# Patient Record
Sex: Female | Born: 1955 | Race: White | Hispanic: No | Smoking: Former smoker
Health system: Southern US, Community
[De-identification: ages and names within clinical notes are randomized; demographics above are authoritative.]

## PROBLEM LIST (undated history)

## (undated) DIAGNOSIS — I480 Paroxysmal atrial fibrillation: Secondary | ICD-10-CM

## (undated) DIAGNOSIS — K219 Gastro-esophageal reflux disease without esophagitis: Secondary | ICD-10-CM

## (undated) DIAGNOSIS — R079 Chest pain, unspecified: Secondary | ICD-10-CM

## (undated) DIAGNOSIS — F4310 Post-traumatic stress disorder, unspecified: Secondary | ICD-10-CM

## (undated) DIAGNOSIS — G4730 Sleep apnea, unspecified: Secondary | ICD-10-CM

## (undated) DIAGNOSIS — E042 Nontoxic multinodular goiter: Secondary | ICD-10-CM

## (undated) DIAGNOSIS — R0789 Other chest pain: Secondary | ICD-10-CM

## (undated) DIAGNOSIS — E785 Hyperlipidemia, unspecified: Secondary | ICD-10-CM

## (undated) DIAGNOSIS — M549 Dorsalgia, unspecified: Secondary | ICD-10-CM

## (undated) DIAGNOSIS — M542 Cervicalgia: Secondary | ICD-10-CM

## (undated) DIAGNOSIS — G43909 Migraine, unspecified, not intractable, without status migrainosus: Secondary | ICD-10-CM

## (undated) DIAGNOSIS — E039 Hypothyroidism, unspecified: Secondary | ICD-10-CM

## (undated) DIAGNOSIS — F411 Generalized anxiety disorder: Secondary | ICD-10-CM

## (undated) DIAGNOSIS — F17200 Nicotine dependence, unspecified, uncomplicated: Secondary | ICD-10-CM

## (undated) DIAGNOSIS — E538 Deficiency of other specified B group vitamins: Secondary | ICD-10-CM

## (undated) DIAGNOSIS — Z720 Tobacco use: Secondary | ICD-10-CM

## (undated) DIAGNOSIS — F329 Major depressive disorder, single episode, unspecified: Secondary | ICD-10-CM

## (undated) DIAGNOSIS — G5600 Carpal tunnel syndrome, unspecified upper limb: Secondary | ICD-10-CM

## (undated) DIAGNOSIS — J449 Chronic obstructive pulmonary disease, unspecified: Secondary | ICD-10-CM

## (undated) DIAGNOSIS — F32A Depression, unspecified: Secondary | ICD-10-CM

## (undated) DIAGNOSIS — N83209 Unspecified ovarian cyst, unspecified side: Secondary | ICD-10-CM

## (undated) HISTORY — PX: PARATHYROIDECTOMY: SHX19

## (undated) HISTORY — DX: Migraine, unspecified, not intractable, without status migrainosus: G43.909

## (undated) HISTORY — DX: Chronic obstructive pulmonary disease, unspecified: J44.9

## (undated) HISTORY — DX: Deficiency of other specified B group vitamins: E53.8

## (undated) HISTORY — DX: Hypothyroidism, unspecified: E03.9

## (undated) HISTORY — PX: UVULOPALATOPHARYNGOPLASTY: SHX827

## (undated) HISTORY — PX: FOOT SURGERY: SHX648

## (undated) HISTORY — DX: Nicotine dependence, unspecified, uncomplicated: F17.200

## (undated) HISTORY — PX: CHOLECYSTECTOMY: SHX55

## (undated) HISTORY — DX: Depression, unspecified: F32.A

## (undated) HISTORY — DX: Chest pain, unspecified: R07.9

## (undated) HISTORY — DX: Hyperlipidemia, unspecified: E78.5

## (undated) HISTORY — PX: OTHER SURGICAL HISTORY: SHX169

## (undated) HISTORY — DX: Other chest pain: R07.89

## (undated) HISTORY — DX: Major depressive disorder, single episode, unspecified: F32.9

## (undated) HISTORY — DX: Nontoxic multinodular goiter: E04.2

## (undated) HISTORY — PX: TONSILLECTOMY: SHX5217

## (undated) HISTORY — DX: Dorsalgia, unspecified: M54.9

## (undated) HISTORY — DX: Generalized anxiety disorder: F41.1

## (undated) HISTORY — DX: Tobacco use: Z72.0

## (undated) HISTORY — DX: Post-traumatic stress disorder, unspecified: F43.10

## (undated) HISTORY — PX: NASAL POLYP SURGERY: SHX186

## (undated) HISTORY — DX: Unspecified ovarian cyst, unspecified side: N83.209

## (undated) HISTORY — DX: Paroxysmal atrial fibrillation: I48.0

## (undated) HISTORY — DX: Sleep apnea, unspecified: G47.30

## (undated) HISTORY — DX: Carpal tunnel syndrome, unspecified upper limb: G56.00

## (undated) HISTORY — DX: Gastro-esophageal reflux disease without esophagitis: K21.9

## (undated) HISTORY — DX: Cervicalgia: M54.2

---

## 1986-05-07 HISTORY — PX: TUBAL LIGATION: SHX77

## 1990-09-05 HISTORY — PX: THYROIDECTOMY: SHX17

## 1996-10-06 HISTORY — PX: OTHER SURGICAL HISTORY: SHX169

## 2002-12-28 ENCOUNTER — Encounter (INDEPENDENT_AMBULATORY_CARE_PROVIDER_SITE_OTHER): Payer: Self-pay | Admitting: Internal Medicine

## 2003-10-21 ENCOUNTER — Other Ambulatory Visit: Payer: Self-pay

## 2003-10-25 ENCOUNTER — Encounter (INDEPENDENT_AMBULATORY_CARE_PROVIDER_SITE_OTHER): Payer: Self-pay | Admitting: Internal Medicine

## 2003-10-25 HISTORY — PX: OTHER SURGICAL HISTORY: SHX169

## 2005-04-03 ENCOUNTER — Encounter (INDEPENDENT_AMBULATORY_CARE_PROVIDER_SITE_OTHER): Payer: Self-pay | Admitting: Internal Medicine

## 2006-03-04 ENCOUNTER — Encounter (INDEPENDENT_AMBULATORY_CARE_PROVIDER_SITE_OTHER): Payer: Self-pay | Admitting: Internal Medicine

## 2006-04-08 ENCOUNTER — Encounter (INDEPENDENT_AMBULATORY_CARE_PROVIDER_SITE_OTHER): Payer: Self-pay | Admitting: Internal Medicine

## 2006-05-02 ENCOUNTER — Encounter (INDEPENDENT_AMBULATORY_CARE_PROVIDER_SITE_OTHER): Payer: Self-pay | Admitting: Internal Medicine

## 2006-05-02 ENCOUNTER — Encounter: Admission: RE | Admit: 2006-05-02 | Discharge: 2006-05-02 | Payer: Self-pay | Admitting: Neurosurgery

## 2006-05-02 HISTORY — PX: OTHER SURGICAL HISTORY: SHX169

## 2006-05-14 ENCOUNTER — Encounter (INDEPENDENT_AMBULATORY_CARE_PROVIDER_SITE_OTHER): Payer: Self-pay | Admitting: Internal Medicine

## 2006-05-14 DIAGNOSIS — N83209 Unspecified ovarian cyst, unspecified side: Secondary | ICD-10-CM | POA: Insufficient documentation

## 2006-08-11 ENCOUNTER — Inpatient Hospital Stay: Payer: Self-pay | Admitting: Internal Medicine

## 2006-08-11 ENCOUNTER — Other Ambulatory Visit: Payer: Self-pay

## 2006-08-12 ENCOUNTER — Encounter (INDEPENDENT_AMBULATORY_CARE_PROVIDER_SITE_OTHER): Payer: Self-pay | Admitting: Internal Medicine

## 2006-08-12 ENCOUNTER — Other Ambulatory Visit: Payer: Self-pay

## 2006-08-12 HISTORY — PX: CARDIAC CATHETERIZATION: SHX172

## 2006-08-13 ENCOUNTER — Encounter (INDEPENDENT_AMBULATORY_CARE_PROVIDER_SITE_OTHER): Payer: Self-pay | Admitting: Internal Medicine

## 2007-01-28 ENCOUNTER — Ambulatory Visit: Payer: Self-pay | Admitting: Family Medicine

## 2007-01-28 DIAGNOSIS — F17200 Nicotine dependence, unspecified, uncomplicated: Secondary | ICD-10-CM | POA: Insufficient documentation

## 2007-01-28 DIAGNOSIS — R0789 Other chest pain: Secondary | ICD-10-CM | POA: Insufficient documentation

## 2007-01-28 DIAGNOSIS — F411 Generalized anxiety disorder: Secondary | ICD-10-CM | POA: Insufficient documentation

## 2007-01-28 DIAGNOSIS — E039 Hypothyroidism, unspecified: Secondary | ICD-10-CM | POA: Insufficient documentation

## 2007-01-28 DIAGNOSIS — Z720 Tobacco use: Secondary | ICD-10-CM | POA: Insufficient documentation

## 2007-01-28 DIAGNOSIS — E042 Nontoxic multinodular goiter: Secondary | ICD-10-CM | POA: Insufficient documentation

## 2007-01-28 DIAGNOSIS — K219 Gastro-esophageal reflux disease without esophagitis: Secondary | ICD-10-CM | POA: Insufficient documentation

## 2007-01-29 ENCOUNTER — Encounter (INDEPENDENT_AMBULATORY_CARE_PROVIDER_SITE_OTHER): Payer: Self-pay | Admitting: Internal Medicine

## 2007-01-29 DIAGNOSIS — E785 Hyperlipidemia, unspecified: Secondary | ICD-10-CM | POA: Insufficient documentation

## 2007-01-29 DIAGNOSIS — J449 Chronic obstructive pulmonary disease, unspecified: Secondary | ICD-10-CM | POA: Insufficient documentation

## 2007-02-13 ENCOUNTER — Ambulatory Visit: Payer: Self-pay | Admitting: Family Medicine

## 2007-03-18 ENCOUNTER — Ambulatory Visit: Payer: Self-pay | Admitting: Family Medicine

## 2007-05-15 LAB — CONVERTED CEMR LAB
ALT: 11 units/L (ref 0–35)
Alkaline Phosphatase: 61 units/L (ref 39–117)
Calcium: 9.3 mg/dL (ref 8.4–10.5)
Eosinophils Relative: 1 % (ref 0.0–5.0)
GFR calc Af Amer: 67 mL/min
GFR calc non Af Amer: 56 mL/min
HCT: 43.1 % (ref 36.0–46.0)
Hemoglobin: 14.9 g/dL (ref 12.0–15.0)
Lymphocytes Relative: 26.7 % (ref 12.0–46.0)
Monocytes Absolute: 0.5 10*3/uL (ref 0.2–0.7)
Neutrophils Relative %: 65.8 % (ref 43.0–77.0)
Platelets: 264 10*3/uL (ref 150–400)
Potassium: 4.6 meq/L (ref 3.5–5.1)
RBC: 4.87 M/uL (ref 3.87–5.11)
RDW: 12.5 % (ref 11.5–14.6)
TSH: 1.44 microintl units/mL (ref 0.35–5.50)
Total CHOL/HDL Ratio: 5.4
Total Protein: 7.1 g/dL (ref 6.0–8.3)
Triglycerides: 92 mg/dL (ref 0–149)

## 2007-05-30 ENCOUNTER — Telehealth (INDEPENDENT_AMBULATORY_CARE_PROVIDER_SITE_OTHER): Payer: Self-pay | Admitting: Internal Medicine

## 2007-06-09 ENCOUNTER — Ambulatory Visit: Payer: Self-pay | Admitting: Family Medicine

## 2007-07-30 ENCOUNTER — Ambulatory Visit: Payer: Self-pay | Admitting: Family Medicine

## 2007-07-30 ENCOUNTER — Ambulatory Visit: Payer: Self-pay | Admitting: Cardiovascular Disease

## 2007-07-30 ENCOUNTER — Inpatient Hospital Stay (HOSPITAL_COMMUNITY): Admission: EM | Admit: 2007-07-30 | Discharge: 2007-07-31 | Payer: Self-pay | Admitting: Emergency Medicine

## 2007-07-30 DIAGNOSIS — I4891 Unspecified atrial fibrillation: Secondary | ICD-10-CM | POA: Insufficient documentation

## 2007-07-31 ENCOUNTER — Encounter: Payer: Self-pay | Admitting: Cardiovascular Disease

## 2007-08-04 ENCOUNTER — Ambulatory Visit: Payer: Self-pay | Admitting: Cardiology

## 2007-08-08 ENCOUNTER — Ambulatory Visit: Payer: Self-pay | Admitting: Cardiology

## 2007-08-19 ENCOUNTER — Ambulatory Visit: Payer: Self-pay | Admitting: Cardiology

## 2007-08-25 ENCOUNTER — Telehealth (INDEPENDENT_AMBULATORY_CARE_PROVIDER_SITE_OTHER): Payer: Self-pay | Admitting: Internal Medicine

## 2007-08-25 ENCOUNTER — Ambulatory Visit: Payer: Self-pay | Admitting: Cardiology

## 2007-08-26 ENCOUNTER — Telehealth (INDEPENDENT_AMBULATORY_CARE_PROVIDER_SITE_OTHER): Payer: Self-pay | Admitting: Internal Medicine

## 2007-09-03 ENCOUNTER — Ambulatory Visit: Payer: Self-pay | Admitting: Family Medicine

## 2007-09-03 DIAGNOSIS — F341 Dysthymic disorder: Secondary | ICD-10-CM | POA: Insufficient documentation

## 2007-09-08 ENCOUNTER — Ambulatory Visit: Payer: Self-pay | Admitting: Cardiovascular Disease

## 2007-09-08 LAB — CONVERTED CEMR LAB
Free T4: 1.7 ng/dL — ABNORMAL HIGH (ref 0.6–1.6)
TSH: 1.29 microintl units/mL (ref 0.35–5.50)

## 2007-12-08 ENCOUNTER — Ambulatory Visit: Payer: Self-pay | Admitting: Cardiovascular Disease

## 2007-12-11 ENCOUNTER — Emergency Department (HOSPITAL_COMMUNITY): Admission: EM | Admit: 2007-12-11 | Discharge: 2007-12-12 | Payer: Self-pay | Admitting: Emergency Medicine

## 2007-12-11 ENCOUNTER — Telehealth (INDEPENDENT_AMBULATORY_CARE_PROVIDER_SITE_OTHER): Payer: Self-pay | Admitting: Internal Medicine

## 2007-12-24 ENCOUNTER — Telehealth (INDEPENDENT_AMBULATORY_CARE_PROVIDER_SITE_OTHER): Payer: Self-pay | Admitting: Internal Medicine

## 2007-12-26 ENCOUNTER — Telehealth (INDEPENDENT_AMBULATORY_CARE_PROVIDER_SITE_OTHER): Payer: Self-pay | Admitting: Internal Medicine

## 2008-02-25 ENCOUNTER — Ambulatory Visit: Payer: Self-pay | Admitting: Family Medicine

## 2008-02-25 DIAGNOSIS — M1990 Unspecified osteoarthritis, unspecified site: Secondary | ICD-10-CM | POA: Insufficient documentation

## 2008-02-26 ENCOUNTER — Telehealth: Payer: Self-pay | Admitting: Family Medicine

## 2008-04-12 IMAGING — CR DG CHEST 2V
2 series · 2 of 2 positions shown · non-contrast
Comparison: None

CLINICAL DATA: *PAIN.  TACHYCARDIA, CHEST TIGHTNESS, SHORTNESS OF
BREATH

CHEST - 2 VIEW

[w chest pa]
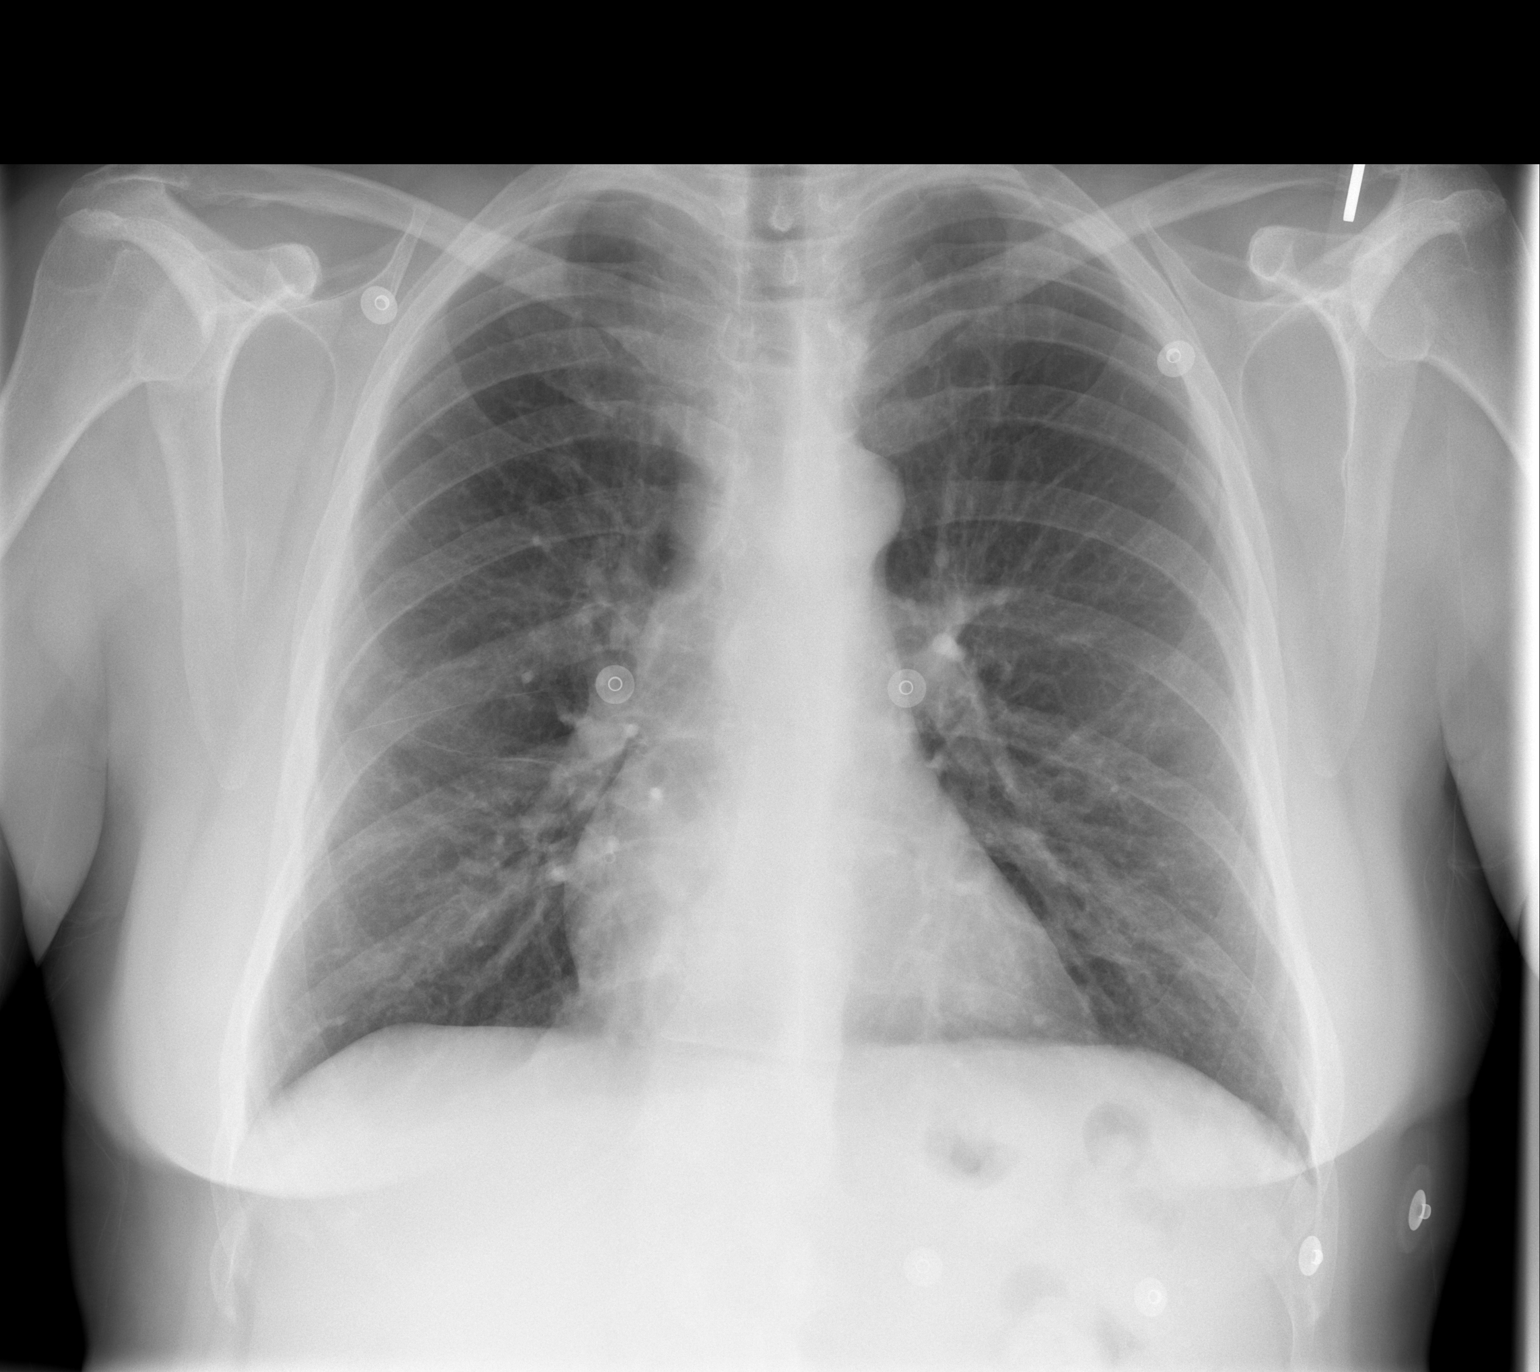

[w chest lat]
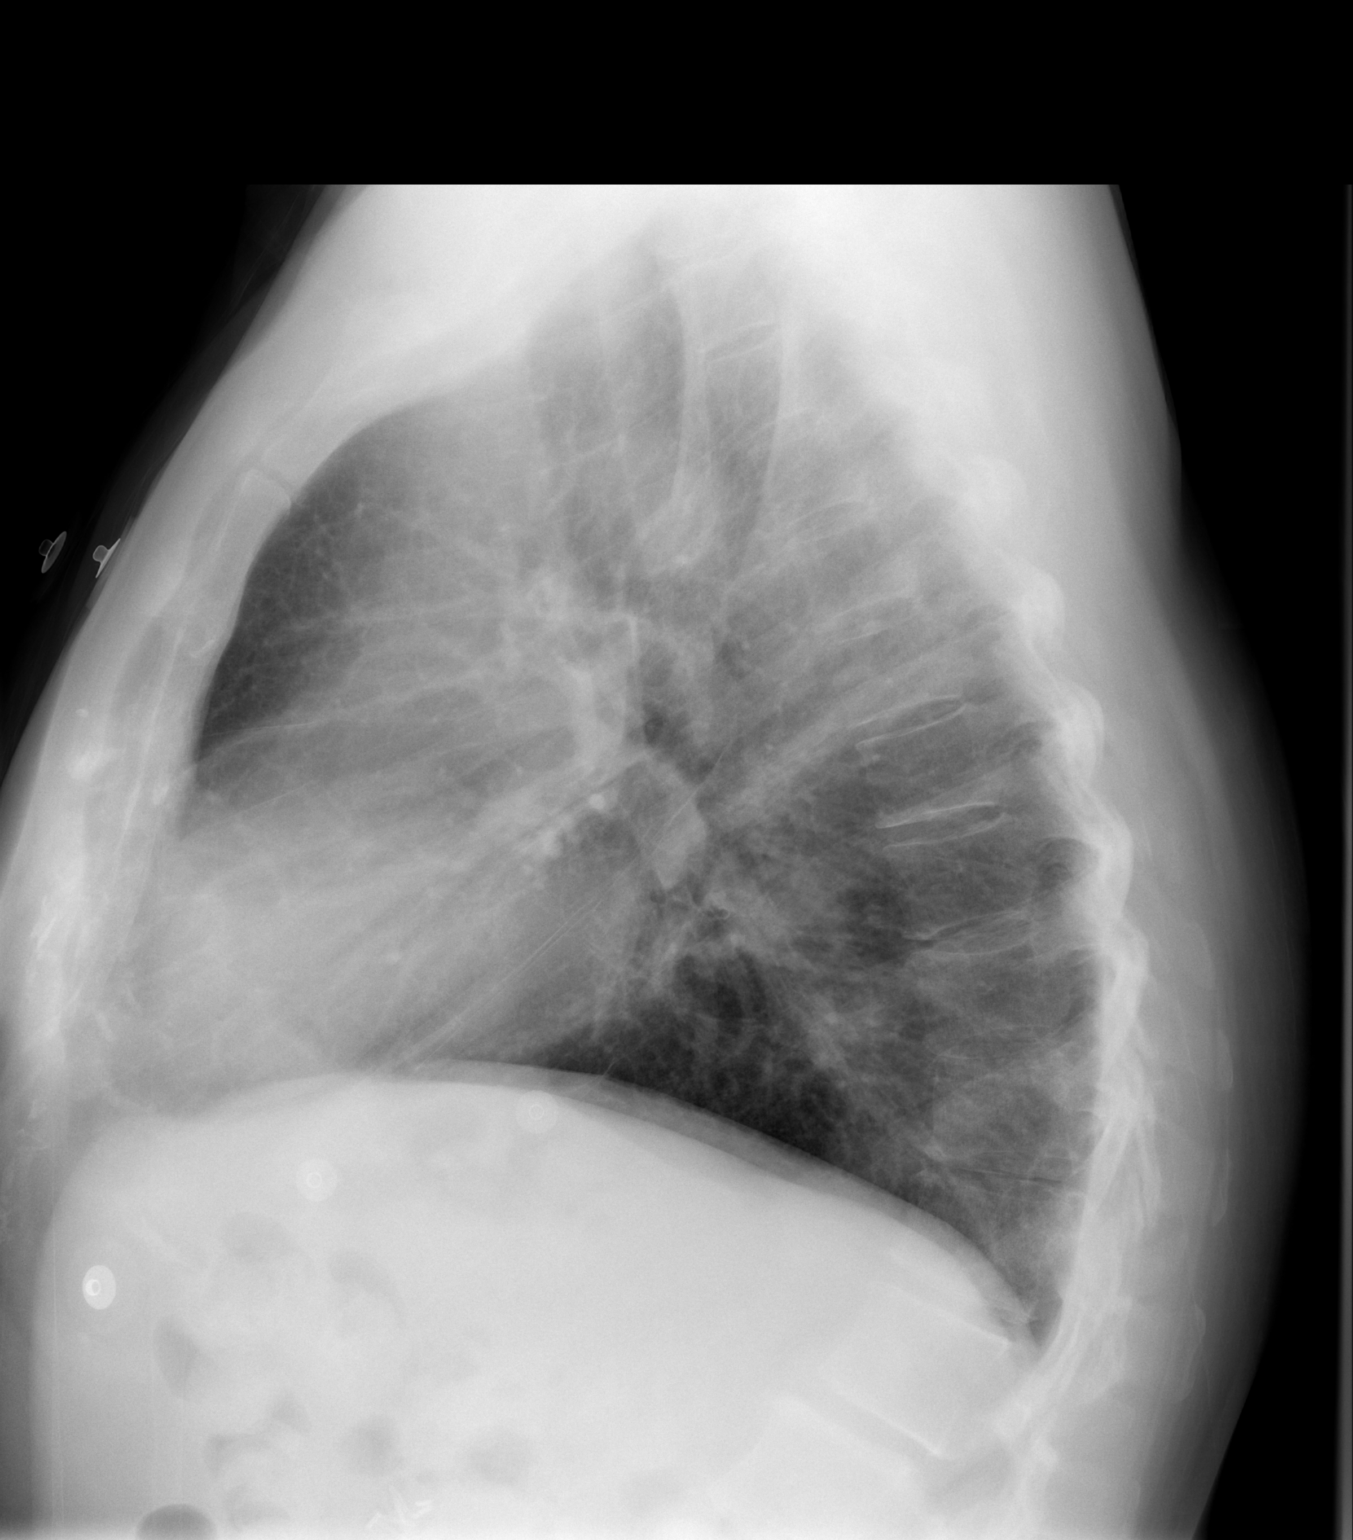

[2 of 2 positions shown; findings below may reference images not displayed]

FINDINGS: Trachea midline.  Heart size normal.  Lungs are clear.
No pleural fluid.
IMPRESSION: No acute findings.

## 2008-05-08 ENCOUNTER — Ambulatory Visit: Payer: Self-pay | Admitting: *Deleted

## 2008-05-08 ENCOUNTER — Inpatient Hospital Stay (HOSPITAL_COMMUNITY): Admission: EM | Admit: 2008-05-08 | Discharge: 2008-05-09 | Payer: Self-pay | Admitting: Emergency Medicine

## 2008-05-28 ENCOUNTER — Telehealth: Payer: Self-pay | Admitting: Family Medicine

## 2008-06-15 ENCOUNTER — Ambulatory Visit: Payer: Self-pay | Admitting: Unknown Physician Specialty

## 2008-07-03 ENCOUNTER — Emergency Department (HOSPITAL_COMMUNITY): Admission: EM | Admit: 2008-07-03 | Discharge: 2008-07-03 | Payer: Self-pay | Admitting: Emergency Medicine

## 2008-07-03 ENCOUNTER — Ambulatory Visit: Payer: Self-pay | Admitting: Internal Medicine

## 2008-07-06 ENCOUNTER — Ambulatory Visit: Payer: Self-pay | Admitting: Family Medicine

## 2008-07-06 DIAGNOSIS — J209 Acute bronchitis, unspecified: Secondary | ICD-10-CM | POA: Insufficient documentation

## 2008-07-14 ENCOUNTER — Ambulatory Visit: Payer: Self-pay | Admitting: Internal Medicine

## 2008-07-14 ENCOUNTER — Ambulatory Visit: Payer: Self-pay

## 2008-12-09 ENCOUNTER — Telehealth: Payer: Self-pay | Admitting: Internal Medicine

## 2009-01-03 ENCOUNTER — Ambulatory Visit: Payer: Self-pay | Admitting: Internal Medicine

## 2010-05-28 ENCOUNTER — Encounter: Payer: Self-pay | Admitting: Neurosurgery

## 2010-08-21 LAB — POCT CARDIAC MARKERS
CKMB, poc: 1.2 ng/mL (ref 1.0–8.0)
Myoglobin, poc: 57.1 ng/mL (ref 12–200)
Troponin i, poc: <0.05 ng/mL (ref 0.00–0.09)

## 2010-08-21 LAB — POCT I-STAT, CHEM 8
Chloride: 107 mEq/L (ref 96–112)
Potassium: 3.6 mEq/L (ref 3.5–5.1)

## 2010-08-21 LAB — PATHOLOGIST SMEAR REVIEW

## 2010-08-21 LAB — DIFFERENTIAL
Eosinophils Absolute: 0.1 10*3/uL (ref 0.0–0.7)
Neutrophils Relative %: 43 % (ref 43–77)

## 2010-08-21 LAB — CARDIAC PANEL(CRET KIN+CKTOT+MB+TROPI)
Relative Index: INVALID (ref 0.0–2.5)
Total CK: 42 U/L (ref 7–177)
Troponin I: 0.01 ng/mL (ref 0.00–0.06)

## 2010-08-21 LAB — CK TOTAL AND CKMB (NOT AT ARMC)
CK, MB: 1.5 ng/mL (ref 0.3–4.0)
Total CK: 59 U/L (ref 7–177)

## 2010-08-21 LAB — CBC
MCHC: 33.4 g/dL (ref 30.0–36.0)
MCV: 90.7 fL (ref 78.0–100.0)
Platelets: 261 10*3/uL (ref 150–400)
RBC: 5.13 MIL/uL — ABNORMAL HIGH (ref 3.87–5.11)
RDW: 13.2 % (ref 11.5–15.5)
WBC: 13.3 10*3/uL — ABNORMAL HIGH (ref 4.0–10.5)

## 2010-08-21 LAB — TROPONIN I: Troponin I: 0.01 ng/mL (ref 0.00–0.06)

## 2010-08-21 LAB — LIPID PANEL
HDL: 36 mg/dL — ABNORMAL LOW (ref >39)
LDL Cholesterol: 107 mg/dL — ABNORMAL HIGH (ref 0–99)
Triglycerides: 141 mg/dL (ref <150)

## 2010-08-21 LAB — TSH: TSH: 2.497 u[IU]/mL (ref 0.350–4.500)

## 2010-08-22 LAB — DIFFERENTIAL
Eosinophils Absolute: 0.2 10*3/uL (ref 0.0–0.7)
Eosinophils Relative: 2 % (ref 0–5)
Lymphocytes Relative: 44 % (ref 12–46)
Lymphs Abs: 4.7 10*3/uL — ABNORMAL HIGH (ref 0.7–4.0)
Monocytes Absolute: 0.8 10*3/uL (ref 0.1–1.0)
Monocytes Relative: 7 % (ref 3–12)
Neutrophils Relative %: 47 % (ref 43–77)

## 2010-08-22 LAB — BASIC METABOLIC PANEL
BUN: 18 mg/dL (ref 6–23)
Calcium: 8.8 mg/dL (ref 8.4–10.5)
GFR calc non Af Amer: 54 mL/min — ABNORMAL LOW (ref >60)
Potassium: 3.6 mEq/L (ref 3.5–5.1)
Sodium: 140 mEq/L (ref 135–145)

## 2010-08-22 LAB — CBC
MCV: 92.5 fL (ref 78.0–100.0)
WBC: 10.7 10*3/uL — ABNORMAL HIGH (ref 4.0–10.5)

## 2010-08-22 LAB — PROTIME-INR: Prothrombin Time: 12.9 seconds (ref 11.6–15.2)

## 2010-08-22 LAB — POCT CARDIAC MARKERS: Troponin i, poc: <0.05 ng/mL (ref 0.00–0.09)

## 2010-09-19 NOTE — Assessment & Plan Note (Signed)
[facility]                            CARDIOLOGY OFFICE NOTE   NAME:[name], [name]                      MRN:          [id number]  DATE:09/08/2007                            DOB:          1956/05/06    [name] is a delightful patient, previously seen by Dr. Gabriel Rung in the  late 1990s for PAF and palpitations.  She subsequently saw Dr. Myrtis Ser in  the hospital from 07/30/2007 to 07/31/2007.  She had a brief episode of  atrial fibrillation.   Reading through her hospital notes, the patient had significant  palpitations.  She was a bit hypotensive on Cardizem.  She was given  amiodarone and converted relatively quickly.  In the hospitalization,  her TSH was noted to be slightly suppressed in the 0.2 range.  Her  Synthroid was not stopped.  The patient had a previously normal heart  cath a year ago in [place].   I talked to [name] at length.  Her palpitations are improved, they have  not been recurrent.  She was discharged on both amiodarone and Coumadin,  neither of which I think she needs at this time.  She is happy to get  off both in terms of the long-term side effects.  She has been on  Inderal in the past and this has caused marked fatigue and loss of hair.  Given this, I told her I thought she should probably be on low-dose  Cardizem with an aspirin.   Her echocardiogram at the time of her hospitalization showed good LV  function.   In the future if she has recurrent A fib, I think she would be a better  candidate for flecainide since she has no documented structural heart  disease.   REVIEW OF SYSTEMS:  Remarkable for some anxiety and stress.  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  She  tends to worked nights.  Two of her daughter's have recently moved out  and left the three cats for her to care for.   Review of systems otherwise negative.   CURRENT MEDICATIONS:  1. Bupropion 150 a day.  2. Pantoprazole  40 a day.  3. Buspirone 10 mg 1/2 tablet b.i.d.  4. Amiodarone 400 a day.  5. Warfarin as directed.  6. Synthroid 175 mcg a day.   She sees [name] Billie D. Bean, FNP, in [place].   PHYSICAL EXAMINATION:  GENERAL:  Remarkable for a healthy-appearing  middle-aged white female in no distress.  VITAL SIGNS:  Weight is 196, blood pressure 124/79, pulse 69 and  regular, afebrile.  HEENT:  Unremarkable.  NECK:  Carotids normal without bruit.  No lymphadenopathy, thyromegaly  or JVP elevation.  LUNGS:  Clear.  Good diaphragmatic motion.  No wheezing.  CARDIOVASCULAR:  S1-S2.  Normal heart sounds.  PMI normal.  ABDOMEN:  Benign.  Bowel sounds positive.  No AAA, no bruit, no  hepatosplenomegaly or hepatojugular reflux.  No tenderness.  EXTREMITIES:  Distal pulses are intact.  No edema.  NEURO:  Nonfocal.  SKIN:  Warm and dry.  No muscular weakness.   EKG shows sinus rhythm with poor R wave progression.   IMPRESSION:  1. History of paroxysmal atrial fibrillation.  Stop amiodarone and      Coumadin and start aspirin.  2. Hypertension, currently well-controlled.  Start Cardizem CD.  Avoid      beta-blockers if possible due to previous history of hair loss.  3. Hyperthyroidism.  Check TSH, T4 today.  Follow-up with Billie D.      Bean, FNP.  I suspect she will have her on levothyroxine dose      decreased.  4. History of reflux.  Continue pantoprazole 40 a day, low-salt diet.      Avoid late-night meals.  The patient will follow up with Dr. Myrtis Ser      in about 3 months.     [name]. Eden Emms, MD, [facility]  Electronically Signed    PCN/MedQ  DD: 09/08/2007  DT: 09/08/2007  Job #: [id number]

## 2010-09-19 NOTE — H&P (Signed)
NAME:  [name], [name] NO.:  [id number]   MEDICAL RECORD NO.:  [id number]          PATIENT TYPE:  INP   LOCATION:  1824                         FACILITY:  [facility]   PHYSICIAN:  Jennelle Human. Marisue Humble, MD DATE OF BIRTH:  [birth year]   DATE OF ADMISSION:  05/08/2008  DATE OF DISCHARGE:                              HISTORY & PHYSICAL   PRIMARY CARDIOLOGIST:  Theron Arista C. Eden Emms, MD, [facility] and Veverly Fells. Excell Seltzer,  MD   CHIEF COMPLAINT:  Recurrence of atrial fibrillation.   HISTORY OF PRESENT ILLNESS:  This is a 55 year-old lady with a long  history of paroxysmal atrial fibrillation who was recently hospitalized  back in March 2009 for atrial fibrillation.  She was briefly put on  amiodarone and converted quickly; however, since she is young and they  did not want to expose her to amiodarone-induced toxicities, she was  placed on Cardizem and aspirin.  She has been changed from Cardizem to  metoprolol and is currently on 50 mg daily and has been doing well until  this afternoon with which she states she was sitting on a couch and fell  asleep and woke up with atrial fibrillation with rapid ventricular  response.  She had a sensation of chest fullness at the time when it  started; however, it is gone now after she has been slowed down from  160s to the 130s with IV Lopressor in the emergency department.   PAST MEDICAL HISTORY:  1. Paroxysmal atrial fibrillation that has been present since      childhood.  Also tends to run in family members  2. COPD.  3. Multinodular goiter.  4. Hyperlipidemia.  5. Depression.  6. Hypothyroidism.  7. Thyroidectomy.  8. Cystectomy.  9. History of normal cardiac catheterization at [facility] in the past.  10.Normal echocardiogram done on last admission in March 2009.   ALLERGIES:  PENICILLIN and SULFA.   CURRENT MEDICATIONS:  1. Aspirin 325 daily.  2. Buspirone 20 mg daily.  3. Wellbutrin 150 mg daily.  4. Levothyroxine 175 mcg daily.  5.  Protonix 40 mg daily.  6. Toprol 50 mg daily.   SOCIAL HISTORY:  Lives in [place] alone, works as a Production designer, theatre/television/film at [employer].  She has smoked approximately 30 pack years and has  occasional alcohol use.   FAMILY HISTORY:  Mother had CHF and atrial fibrillation.  Father died of  a cerebral hemorrhage.  She has 1 sister with atrial fibrillation status  post ablation procedure in 1 nephew with atrial fibrillation.   REVIEW OF SYSTEMS:  Negative x10 except for that stated in the HPI.   PHYSICAL EXAMINATION:  VITAL SIGNS:  Afebrile.  Pulse is 134,  respirations 20, BP is 110/84.  HEENT:  Pupils equally round and reactive to light.  Extraocular muscles  are intact.  Sclerae are clear.  Mucous membranes are moist.  Oropharynx  is clear.  NECK:  Supple without lymphadenopathy or masses.  There are no  significant bruits.  No elevation of jugular venous pressure.  LUNGS:  Clear to auscultation bilaterally.  HEART:  Irregularly irregular.  No murmurs, gallops, or rubs.  ABDOMEN:  Soft, nondistended, nontender.  Good bowel sounds.  No  hepatosplenomegaly.  There is no hepatojugular reflux.  EXTREMITIES:  No clubbing, cyanosis, or edema.  Dorsalis pedis,  posterior tibial pulses are normal.   Chest x-ray normal.  ECG obtained in the emergency department showed an  initial rate of 162.  She was in atrial fibrillation with rapid  ventricular response.  No ST or T-wave changes.   LABORATORY DATA:  White count is 13.3, H&H is 15.6 and 46.5 with  platelets of 261.  Sodium is 143, potassium 3.6, chloride 107, BUN 17,  creatinine 1.2.  First set of cardiac enzymes are negative.   ASSESSMENT AND PLAN:  1. Paroxysmal atrial fibrillation.  Today she has presented with a      recurrence of her atrial fibrillation with rapid ventricular      response.  Currently, she is on aspirin since she has a CHADS2      score of 0 and she is also on metoprolol for rate control.  She has      been on  amiodarone in the past, but secondary to concerns over      toxicity she was taken off of that after approximately 10 days.  I      would like to check a TSH and she is on levothyroxine and could be      over supplemented at this point.  Looking back over her      echocardiogram that was done this last March, there does not appear      to be any structural heart disease.  I will start flecainide at 50      mg q.12 h. secondary to her structurally normal heart.  I will give      Lovenox for anticoagulation until she is back in a sinus rhythm.      She will likely stay on aspirin secondary to her CHADS2 score of 0.  2. Hypothyroidism.  Continue levothyroxine at the current dose, but      check TSH.  3. Hyperlipidemia.  We will check fasting lipid profile in the a.m.  4. Smoking.  Encouraged smoking cessation.      Jennelle Human Marisue Humble, MD  Electronically Signed     GBS/MEDQ  D:  05/08/2008  T:  05/08/2008  Job:  [id number]

## 2010-09-19 NOTE — H&P (Signed)
NAME:  [name], [name] NO.:  [id number]   MEDICAL RECORD NO.:  [id number]          PATIENT TYPE:  INP   LOCATION:  3733                         FACILITY:  [facility]   PHYSICIAN:  Noralyn Pick. Eden Emms, MD, FACCDATE OF BIRTH:  [birth year]   DATE OF ADMISSION:  07/30/2007  DATE OF DISCHARGE:                              HISTORY & PHYSICAL   [name], MD   PRIMARY CARE PHYSICIAN:  Dr. [name].   PRIMARY CARDIOLOGIST:  Dr. Theron Arista C. Nishan.  (This will be a new patient  for him.)   HISTORY OF PRESENT ILLNESS:  This is a 55 year old Caucasian female with  a history of atrial fibrillation since childhood, who normally sees a  cardiologist in [place], but has not needed followup since 2007.  The  patient at that time did have a cardiac catheterization at [facility] by a [facility] Fellow and was told it was okay.  She had no further  need to follow up with a cardiologist.  The patient states that four or  five years ago she had a cardiac arrest during a hospitalization for  dehydration.  Afterwards she had no further episodes of atrial  fibrillation.  She has not been on any medications for this.   Today the patient awoke at around 7 a.m. this morning with palpitations,  shortness of breath, heart racing and chest pressure, which was very  reminiscent of her previous symptoms during atrial fibrillation  episodes.  The patient went to see her primary care physician at [facility] and an electrocardiogram was completed, revealing atrial  fibrillation with RVR with a ventricular rate of 150 beats per minute.  The patient was brought to the [facility] Emergency  Room for further evaluation and treatment.  The patient was given a 20  mg dose of Cardizem x1 and her blood pressure dropped from 120's to 80's  systolic.  She was given a 500 mL fluid bolus and planned to have  another Cardizem of 5 mg but this was held, once we came on the scene.  The  patient is currently complaining of some mild chest pressure and no  acute shortness of breath.  She denies any nausea, vomiting, diaphoresis  or dizziness.   REVIEW OF SYSTEMS:  As above, otherwise negative.   PAST MEDICAL HISTORY:  1. Typical chest pain.  2. Chronic obstructive pulmonary disease.  3. Multinodular goiter.  4. Atrial fibrillation since birth.  5. Anxiety.  6. Tobacco abuse.  7. Hyperlipidemia.  8. Depression.  9. Gastroesophageal reflux disease.  10.Hypothyroidism.   PAST SURGICAL HISTORY:  1. Thyroidectomy.  2. Cholecystectomy.   SOCIAL HISTORY:  The patient lives in [place] alone.  She is a  widow.  She is a Production designer, theatre/television/film at [employer].  She is a 30-pack-year  smoker.  Occasional alcohol use.  No drug use.   FAMILY HISTORY:  Mother died her heart gave out, with a history of  atrial fibrillation.  Father deceased with a cerebral hemorrhage.  She  has two sisters, one with atrial fibrillation with an ablation  procedure.  Another is without health problems that she knows of.   CURRENT MEDICATIONS:  1. Wellbutrin XL 150 mg once daily.  2. Synthroid 175 mcg daily.  3. Nexium 40 mg daily.  4. Aspirin 325 mg p.o. daily.  5. [employer] multivitamins once daily.   ALLERGIES:  PENICILLIN AND SULFA.   LABORATORY DATA:  Pending.   A chest x-ray is pending.   Electrocardiogram:  Reveals atrial fibrillation with a heart rate of 150  beats per minute.  Prior electrocardiogram does not reveal any evidence  of Wolff-Parkinson-White syndrome or prolonged QT interval.   PHYSICAL EXAMINATION:  VITAL SIGNS:  Blood pressure 102/80, pulse 144,  respirations 12, temperature 97.6 degrees, O2 saturation 96% on 2  liters.  HEENT:  Head normocephalic and atraumatic.  Eyes:  Pupils equal, round,  reactive to light and accommodation.  Mucous membranes in the mouth pink  and moist.  Tongue is midline.  NECK:  Supple.  No jugular venous distention, no carotid  bruits  appreciated.  CARDIOVASCULAR:  Tachycardic without murmurs, rubs or gallops.  LUNGS:  Some bibasilar crackles, otherwise negative.  ABDOMEN:  Soft, nontender with 2+ bowel sounds.  EXTREMITIES:  No clubbing, cyanosis or edema.  NEUROLOGIC:  Cranial nerves II-XII  grossly intact.   IMPRESSION:  1. Atrial fibrillation with rapid ventricular response.  2. Hypothyroidism (euthyroid).  3. Tobacco abuse.  4. Depression and anxiety.   PLAN:  This is a 55 year old Caucasian female with a past medical  history of atrial fibrillation.  She had been on diltiazem and digoxin  in the past, but has no longer been on it, as she states she has had no  further episodes since a cardiac arrest in the past during  hospitalization for dehydration.  We will get records from [facility] for cardiac catheterization results and prior history of this  arrest.  The patient will be given an amiodarone bolus of 150 mg x1 with  an amiodarone drip.  The patient will be started on a heparin drip and  p.o. Coumadin will be added.  The patient will also have labs drawn, to check TSH alone and cardiac  enzymes, BMET, D-dimer, CBC and magnesium.  She will also be started on  medications as prior to admission with Wellbutrin and Synthroid and  Nexium.  We will make further recommendations throughout the hospital  course.      Bettey Mare. Lyman Bishop, NP      Noralyn Pick. Eden Emms, MD, [facility]  Electronically Signed    KML/MEDQ  D:  07/30/2007  T:  07/30/2007  Job:  [id number]

## 2010-09-19 NOTE — Assessment & Plan Note (Signed)
[facility] OFFICE NOTE   NAME:[name], [name] MAJETTE                      MRN:          [id number]  DATE:12/08/2007                            DOB:          1955-10-04    [name] was seen in followup with our cardiology office on December 08, 2007.  She is a 55 year old woman with longstanding paroxysmal atrial  fibrillation.  She was hospitalized in March 2009, for a brief episode  of atrial fibrillation.  She was ultimately treated with amiodarone and  converted to sinus rhythm.  She was seen by Dr. Eden Emms in followup in  early May, and was taken off of both amiodarone and Coumadin.  She was  concerned about long-term side effects from amiodarone, and Coumadin was  not felt to be warranted because of her young age and lack of other risk  factors of stroke.  Her medications was switched to Cardizem and  aspirin, and she has done quite well since that transition was made.  She is intolerant of beta-blocker because of severe fatigue and hair  loss.   From a symptomatic standpoint, she continues to have intermittent  palpitations.  They are actually much better than they had been in the  past, and they are relatively mild in nature at this point.  She has no  chest pain, dyspnea, edema, orthopnea, or PND.  She also complains of  pain associated with varicose vein in her legs increasingly worse over  the last several months.   CURRENT MEDICATIONS:  1. Bupropion 150 mg daily.  2. Pantoprazole 40 mg daily.  3. Buspirone 5 mg twice daily.  4. Levothyroxine 175 mcg daily.  5. Aspirin 325 mg daily.  6. Cardizem CD 120 mg daily.   ALLERGIES:  To PENICILLIN and SULFA.   PHYSICAL EXAMINATION:  GENERAL:  The patient is alert and oriented.  She  is in no acute distress.  VITAL SIGNS:  Weight 191 pounds, blood pressure 112/78, heart rate 72,  and respiratory rate 16.  HEENT:  Normal neck.  Normal carotids upstrokes,  without bruits.  JVD  normal.  LUNGS:  Clear bilaterally.  HEART:  Regular rate and rhythm without murmurs or gallops.  ABDOMEN:  Soft and nontender.  No organomegaly.  EXTREMITIES:  No clubbing, cyanosis, or edema.  There are varicosities  with mild tenderness to palpation involving both lower legs.  Peripheral  pulses are intact and equal.   LABORATORY DATA:  EKG shows normal sinus rhythm with sinus arrhythmia.  There are no significant ST segment or T-wave changes.   ASSESSMENT:  1. Paroxysmal atrial fibrillation.  Continue current management with      Cardizem and aspirin.  CHADS score equals 0.  2. Varicose veins with associated pain.  We will refer to Vascular and      Vein Specialist for evaluation and see if there are any minimally      invasive treatment options available.   FOLLOWUP:  I would like to see [name] back in 6 months, and I will  be happy to see  her sooner if any new problems arise.     Veverly Fells. Excell Seltzer, MD  Electronically Signed    MDC/MedQ  DD: 12/08/2007  DT: 12/09/2007  Job #: [id number]   cc:   Billie D. Bean, FNP  [facility]

## 2010-09-19 NOTE — Discharge Summary (Signed)
NAME:  LANEAH, LUFT NO.:  [id number]   MEDICAL RECORD NO.:  [id number]          PATIENT TYPE:  INP   LOCATION:  3733                         FACILITY:  [facility]   PHYSICIAN:  Luis Abed, MD, FACCDATE OF BIRTH:  [birth year]   DATE OF ADMISSION:  07/30/2007  DATE OF DISCHARGE:  07/31/2007                               DISCHARGE SUMMARY   PROCEDURE:  A 2-D echocardiogram.   PRIMARY FINAL DISCHARGE DIAGNOSIS:  Atrial fibrillation with rapid  ventricular response.   SECONDARY DIAGNOSES:  1. Chest pain, felt secondary to atrial fibrillation with rapid      ventricular response.  2. Preserved left ventricular function with an ejection fraction of      65% by echocardiogram and no regional wall motion abnormalities,      left ventricular wall thickness upper limits of normal.  3. Chronic obstructive pulmonary disease.  4. History of multinodular goiter.  5. Anxiety.  6. Ongoing tobacco use.  7. History of hyperlipidemia with a total cholesterol of 172,      triglycerides 92, HDL 35, LDL 119 this admission.  8. Depression.  9. Gastroesophageal reflux disease.  10.Iatrogenic hypothyroidism after thyroidectomy.  11.Status post cholecystectomy.  12.Family history of atrial fibrillation in her mother and one sister.   ALLERGY OR INTOLERANCE:  PENICILLIN and SULFA.   TIME OF DISCHARGE:  46 minutes.   HOSPITAL COURSE:  Ms. Munford is a 55 year old female with no previous  history of coronary artery disease.  She does have a history of atrial  fibrillation which she feels that she has basically had intermittently  all of her life.  She was evaluated previously at [facility] and had a  cardiac catheterization that she reports as being without significant  obstruction.  She has not seen a cardiologist in 4 or 5 years because  she has not had any symptoms.  At one point, she was on digoxin and  Cardizem, but these were discontinued.  On the day of admission, she was  awakened at 7 a.m. with palpitations associated with chest pressure and  shortness of breath.  Her primary care physician did an EKG which showed  atrial fibrillation and a ventricular rate of 150 beats per minute.  She  was transported to [facility] Emergency Room and admitted for further  evaluation and treatment.   Initially, she was given IV Cardizem; however, her systolic blood  pressure dropped into the 80s and she was symptomatic with this.  The  hypotension was treated with a fluid bolus, but because of ongoing rapid  ventricular response and need for rate control with hypotension, IV  amiodarone was used instead.  She was also started on heparin, and  Coumadin was initiated.   Her TSH was slightly low at 0.287, but no med changes were made at this  time.  She is to follow up with her primary care physician to decide of  an adjustment in her Synthroid dose as indicated.  Her CK-MBs were all  within normal limits, but the troponin had some minimal elevation  between 0.1 and 0.3.  It  was felt that this was because of her elevated  heart rate.  She spontaneously converted to sinus rhythm at  approximately 5 a.m. on July 31, 2007.  When she converted to sinus  rhythm, her chest pain, shortness of breath, and nausea resolved.  An  echocardiogram was normal.  There were no significant abnormalities in  either her left ventricle, right ventricle, or valves.   On July 31, 2007, when she converted to sinus rhythm, Ms. Hillery was  switched to p.o. amiodarone.  She maintained sinus rhythm with  ambulation.  After converting to sinus rhythm, her systolic blood  pressure remained low, but stable, running between 91 and 116.  Other  labs showed no significant abnormality with a nonfasting blood sugar at  112, a D-dimer within normal limits at 0.22, and a lipid profile showing  slightly low HDL and slightly elevated LDL, for which diet therapy is  encouraged.  On July 31, 2007, Dr. Myrtis Ser  evaluated Ms. Canoy and felt  that since her echocardiogram had no significant abnormality, the chest  pain could safely be said to be secondary to the atrial fibrillation.  He felt that she could be discharged home with outpatient followup  arranged.  Because of the location of her job, Ms. Grigorian is requesting  Coumadin followup at [facility] on [address].   DISCHARGE INSTRUCTIONS:  1. Her activity level is to be increased gradually.  2. She is encouraged to stick to a low-sodium, heart-healthy diet and      avoid tobacco.  3. She is to follow up with Dr. Eden Emms on August 14, 2007, at 8:45 and      she is to get a Coumadin check on August 04, 2007, at 1:30 p.m.  She      is to follow up with Dr. [name] as well.   DISCHARGE MEDICATIONS:  1. Coumadin 5 mg daily or as directed.  2. Wellbutrin XL 150 mg daily.  3. Synthroid 175 mcg daily.  4. Protonix 40 mg daily (substituted for Nexium as the patient      requested for cost savings).  5. Aspirin 81 mg daily.  6. Multivitamins as prior to admission.  7. Amiodarone 200 mg 2 tabs t.i.d. for 5 days and 2 tabs daily.      Theodore Demark, PA-C      Luis Abed, MD, [facility]  Electronically Signed    RB/MEDQ  D:  07/31/2007  T:  08/01/2007  Job:  [id number]   cc:   [name], MD

## 2010-09-19 NOTE — Discharge Summary (Signed)
NAME:  [name], [name] NO.:  [id number]   MEDICAL RECORD NO.:  [id number]          PATIENT TYPE:  INP   LOCATION:  2038                         FACILITY:  [facility]   PHYSICIAN:  Rollene Rotunda, MD, FACCDATE OF BIRTH:  June 11, 1955   DATE OF ADMISSION:  05/08/2008  DATE OF DISCHARGE:  05/09/2008                               DISCHARGE SUMMARY   PRIMARY CARDIOLOGIST:  Veverly Fells. Excell Seltzer, MD   PRIMARY CARE PHYSICIAN:  Jennelle Human. Marisue Humble, MD   PROCEDURES PERFORMED DURING HOSPITALIZATION:  None.   FINAL DISCHARGE DIAGNOSES:  1. Paroxysmal atrial fibrillation.  2. History of chronic obstructive pulmonary disease.  3. Multinodular goiter.  4. History of hyperlipidemia.  5. History of depression.  6. Hypothyroidism.  7. History of normal cardiac catheterization at [facility] in the past.  8. Ongoing tobacco abuse.   HOSPITAL COURSE:  This is a 55 year old Caucasian female with  longstanding history of paroxysmal atrial fibrillation and was  hospitalized back in March, for the same reason in 2009.  She was  initially placed on amiodarone, was switched to Cardizem at the  patient's request to avoid amiodarone toxicities.  The patient was then  changed from Cardizem to metoprolol and is currently on that at home.  The patient states, she was sitting on the couch, fell asleep and woke  up with atrial fibrillation with RVR and cessation of chest fullness.  She was seen in the emergency room with a heart rate of 160-138, IV  Lopressor in the emergency department was given, and the patient's heart  rate did respond.  She was admitted to have TSH checked and was placed  on Lovenox for anticoagulation.  She was started on low-dose flecainide  since she has no structural heart disease.   The patient was followed the next day of by Dr. Antoine Poche and Jarrett Ables, physician assistant for evaluation of heart rate control and  symptoms.  The patient had converted to normal sinus  rhythm by the  following morning without any recurrence of atrial fibrillation.  A long  discussion was had with the patient by Dr. Antoine Poche concerning use of  flecainide and it was felt that it was unnecessary at this time.  If she  has recurrence of atrial fibrillation within a short period of time, the  patient may benefit from pill-in-pocket flecainide, at which time, the  patient will present to the emergency room and be given a 300 mg oral  bolus of flecainide and watch for the next 5 hours.  If she has no  arrhythmias and remains in normal sinus rhythm, the patient may be  discharged with the flecainide as an outpatient.  This will be discussed  at a later date should this occur.  In the interim, the patient will  follow with Dr. Excell Seltzer in 2 weeks in [place].   DISCHARGE LABORATORIES:  TSH 2.497, total cholesterol 171, triglycerides  141, HDL 36, and LDL 107.  The LDL 28, sodium 143, potassium 3.6,  chloride 107, BUN 17, creatinine 1.2, and glucose 102.   PHYSICAL EXAMINATION:  VITAL SIGNS:  On  discharge; blood pressure  112/71, heart rate 70, respirations 18, temperature 98.4, and O2 sat 97%  on room air.   DISCHARGE MEDICATIONS:  Same as the patient was taking prior to  admission:  1. Aspirin 325 mg daily.  2. Buspirone 20 mg daily.  3. Wellbutrin 150 mg daily.  4. Levothyroxine 175 mcg daily.  5. Protonix 40 mg daily.  6. Toprol 50 mg daily.   ALLERGIES:  1. SULFA.  2. PENICILLIN.   FOLLOWUP PLANS AND APPOINTMENT:  The patient will follow with Dr. Excell Seltzer  within the next 2 weeks in [place].  Our office will call to make  this appointment as this is a weekend.   The patient has been advised on smoking cessation.   Time spent with the patient to include physician time 35 minutes.      Bettey Mare. Lyman Bishop, NP      Rollene Rotunda, MD, [facility]  Electronically Signed    KML/MEDQ  D:  05/09/2008  T:  05/09/2008  Job:  [id number]

## 2010-09-19 NOTE — H&P (Signed)
NAME:  [name], SHUR NO.:  [id number]   MEDICAL RECORD NO.:  [id number]          PATIENT TYPE:  EMS   LOCATION:  MAJO                         FACILITY:  [facility]   PHYSICIAN:  Duke Salvia, MD, FACCDATE OF BIRTH:  1955/11/17   DATE OF ADMISSION:  07/03/2008  DATE OF DISCHARGE:  07/03/2008                              HISTORY & PHYSICAL   We were asked to see [name] in the emergency room by the EDP for  atrial fibrillation.   [name] is a 55 year old lady who has a lifelong history of  palpitations and a strong family history of atrial fibrillation  including her sister, her mother, and nephews all of which have started  at early ages and who has had diagnosis of atrial fibrillation herself  for at least 10 years.  She has had periods of quiescence for the last 2-  1/2 years and has had increasing frequency of events and has been seen  in the emergency room in March 2009, January 2010, and now again for  atrial fibrillation with a rapid ventricular response.  These were  associated with tachy palpitations, some shortness of breath, and  lightheadedness.  At her last hospitalization, she was given a  prescription for flecainide pill in the pocket.  She was brought to  the emergency room this morning for her atrial fibrillation and was  found to be in rapid ventricular response, then flecainide was given and  2-1/2 hours later, she remains in atrial fibrillation with a rapid  ventricular response and we were called.   About a year ago, somebody had started her on amiodarone.  This was  discontinued.  She was also started on Coumadin, this was discontinued  because of the low [place] score.   Her thromboembolic risk factors are notable only for gender, although  the chart has a history of hypertension.  The patient denies this and I  wonder whether the chart is mis-dictated as hypotension.   She has normal left ventricular function by echo a year ago.  The  patient also has a history of normal catheterization at [facility], date  is not known.   Her sister has undergone catheter ablation in [place].   She has a history of COPD related to cigarettes.   Her past medical history in addition to the above is notable for:  1. Venous varicosities.  2. COPD.  3. Multinodular goiter.  4. Hyperlipidemia.  5. Hypothyroidism.  6. Depression.   Past surgical history is notable for:  1. Thyroidectomy.  2. Cystectomy.    Current medications include:  1. Aspirin 325.  2. Buspirone 20.  3. Wellbutrin 150.  4. Levothyroxine 175 mcg.  5. Protonix 40.  6. Toprol 50.  7. Dispensed this morning, Flecainide.   SOCIAL HISTORY:  She lives alone.  She works as a Production designer, theatre/television/film at [employer]  She smokes.  She drinks alcohol occasionally.  She denies use of  recreational drugs.   Family history is as noted previously.   Review of systems is as noted above and is otherwise negative.   PHYSICAL  EXAMINATION:  GENERAL:  She is a middle-aged Caucasian female  appearing her stated age of 55.  VITAL SIGNS:  Her blood pressure was 82/50, her pulse was 145.  HEENT:  No icterus or xanthomata.  She was wearing glasses.  NECK:  Her neck veins were flat.  Carotids were brisk and full  bilaterally without bruits.  BACK:  Without kyphosis or scoliosis.  LUNGS:  Clear.  HEART:  Heart sounds were irregular and rapid with an early systolic  murmur.  ABDOMEN:  Soft.  Femoral pulses were not examined.  Distal pulses were  intact with no clubbing, cyanosis, or edema.  NEUROLOGIC:  Grossly normal.  SKIN:  Warm and dry.   Electrocardiogram dated this morning demonstrated atrial fibrillation  with a rapid ventricular response at 173 beats per minutes.  It is  currently down to 145, intervals __________/0.9/0.29.   IMPRESSION:  1. Atrial fibrillation with rapid ventricular response.  2. Hypotension associated with atrial fibrillation.  3. Chronic obstructive pulmonary  disease secondary to cigarette use.  4. History of depression.  5. Thromboembolic risk factors, notable only for gender.  6. Venous varicosities.  7. Treated hypothyroidism status post thyroidectomy.   DISCUSSION:  [name] has recurrent atrial fibrillation in the context  of a very strong family history suggesting some kind of a genetic  electrical process.  Given the increasing frequency of the events with 2  hospitalizations now in 2 months, I would favor the use of  antiarrhythmic drug therapy and would probably try flecainide at 100 mg  twice daily to see if we could not avoid frequent recurrences.  In the  event that she would fail antiarrhythmic drug therapy, she would  certainly be a candidate for consideration for catheter ablation.   In the immediate term, however, her hypotension may needs relatively  brisk response.  I would like to give her about 12 hours to see if she  converts on her own.  The likelihood of this being about 80% or so.  In  the event that she fails, we will undertake cardioversion today within  the window of our 24 hours.   We will plan to for now:  1. Admit for 22-hour observation.  2. Volume hydration to maintain blood pressure.  3. Keep n.p.o.  4. Anticipated cardioversion this afternoon if she does not revert.  5. Consider the initiation of outpatient flecainide therapy.      Duke Salvia, MD, [facility]  Electronically Signed     SCK/MEDQ  D:  07/03/2008  T:  07/03/2008  Job:  [id number]

## 2010-10-19 ENCOUNTER — Encounter: Payer: Self-pay | Admitting: Cardiovascular Disease

## 2011-01-29 LAB — CBC
HCT: 39.3
HCT: 42.7
Hemoglobin: 13.1
MCHC: 34.2
MCV: 89.3
MCV: 89.4
Platelets: 229
Platelets: 236
Platelets: 258
RDW: 11.8
RDW: 12.4
WBC: 10.8 — ABNORMAL HIGH

## 2011-01-29 LAB — CK TOTAL AND CKMB (NOT AT ARMC)
CK, MB: 1.6
Relative Index: INVALID

## 2011-01-29 LAB — TROPONIN I: Troponin I: 0.04

## 2011-01-29 LAB — PROTIME-INR
INR: 1
Prothrombin Time: 13.3

## 2011-01-29 LAB — LIPID PANEL: VLDL: 18

## 2011-01-29 LAB — POCT CARDIAC MARKERS: CKMB, poc: <1 — ABNORMAL LOW

## 2011-01-29 LAB — CARDIAC PANEL(CRET KIN+CKTOT+MB+TROPI)
CK, MB: 2.1
CK, MB: 2.6
Total CK: 62
Total CK: 63
Troponin I: 0.12 — ABNORMAL HIGH

## 2011-01-29 LAB — BASIC METABOLIC PANEL
BUN: 9
Chloride: 109
Glucose, Bld: 112 — ABNORMAL HIGH
Potassium: 3.7

## 2011-01-29 LAB — POCT I-STAT, CHEM 8
BUN: 9
Chloride: 106
Glucose, Bld: 104 — ABNORMAL HIGH
HCT: 44
Potassium: 3.6

## 2011-01-29 LAB — D-DIMER, QUANTITATIVE: D-Dimer, Quant: 0.22

## 2011-01-29 LAB — DIFFERENTIAL
Basophils Absolute: 0.1
Basophils Relative: 1
Eosinophils Absolute: 0.1
Neutrophils Relative %: 60

## 2011-01-29 LAB — APTT: aPTT: 28

## 2011-01-29 LAB — HEPARIN LEVEL (UNFRACTIONATED): Heparin Unfractionated: 0.36

## 2011-02-02 LAB — CBC
HCT: 41.7
Hemoglobin: 14.2
Platelets: 244
WBC: 10

## 2011-02-02 LAB — COMPREHENSIVE METABOLIC PANEL
ALT: 12
Albumin: 3.6
Alkaline Phosphatase: 66
BUN: 9
Chloride: 104
Potassium: 4.1
Sodium: 138
Total Bilirubin: 0.4

## 2011-02-02 LAB — DIFFERENTIAL
Basophils Absolute: 0.2 — ABNORMAL HIGH
Basophils Relative: 2 — ABNORMAL HIGH
Eosinophils Absolute: 0.1
Eosinophils Relative: 1
Monocytes Absolute: 0.7
Neutro Abs: 5.4

## 2012-01-21 ENCOUNTER — Ambulatory Visit: Payer: Self-pay | Admitting: Cardiovascular Disease

## 2012-02-07 ENCOUNTER — Encounter: Payer: Self-pay | Admitting: Cardiovascular Disease

## 2012-08-29 ENCOUNTER — Ambulatory Visit (INDEPENDENT_AMBULATORY_CARE_PROVIDER_SITE_OTHER): Payer: 59 | Admitting: Internal Medicine

## 2012-08-29 ENCOUNTER — Encounter: Payer: Self-pay | Admitting: Internal Medicine

## 2012-08-29 VITALS — BP 126/74 | HR 68 | Ht 65.0 in | Wt 196.4 lb

## 2012-08-29 DIAGNOSIS — R079 Chest pain, unspecified: Secondary | ICD-10-CM

## 2012-08-29 DIAGNOSIS — F17200 Nicotine dependence, unspecified, uncomplicated: Secondary | ICD-10-CM

## 2012-08-29 DIAGNOSIS — R0609 Other forms of dyspnea: Secondary | ICD-10-CM | POA: Insufficient documentation

## 2012-08-29 DIAGNOSIS — R0602 Shortness of breath: Secondary | ICD-10-CM

## 2012-08-29 DIAGNOSIS — I4891 Unspecified atrial fibrillation: Secondary | ICD-10-CM

## 2012-08-29 NOTE — Assessment & Plan Note (Signed)
Disinclined to acquiesce to my request to stop smoking

## 2012-08-29 NOTE — Patient Instructions (Addendum)
Your physician has requested that you have en exercise stress myoview. For further information please visit [email or web address]. Please follow instruction sheet, as given.   

## 2012-08-29 NOTE — Assessment & Plan Note (Signed)
Paroxysmal. We will continue her on propafenone. Her thromboembolic risk profile has a CHADS-VASc score of one and gender is insufficient. She is not taking aspirin. This is appropriate.

## 2012-08-29 NOTE — Assessment & Plan Note (Signed)
This may be multifactorial. She has cardiac risk factors including smoking. She is an abnormal ECG concerning for prior MI. We'll undertake stress Myoview scanning

## 2012-08-29 NOTE — Progress Notes (Signed)
ELECTROPHYSIOLOGY CONSULT NOTE  [name], [name], [name] 57 y.o. Admit date: (Not on file) Date of Consult: 08/29/2012  Primary Physician: Vic Ripper PA-C Primary Cardiologist: none  Chief Complaint:  Atrial fib   HPI [name] is a 57 y.o. female   Seen after a hiatus of many years. She has a history of atrial fibrillation dating back to 9s. This occurs in the context of a strong family history with sisters mothers nephews all with early onset atrial fibrillation. Treatment options in the past have included amiodarone, diltiazem, and flecainide pill in thepocket which has failed. It was on this occasion in 2010 that I saw her for the first time. She was also restarted on propafenone   Treadmill testing was okay at that time  Echocardiogram 2009 at ejection fraction 60%; catheterization in the is remote past was apparently normal.  She has done reasonably well on the propafenone was very infrequent atrial fibrillation. She does have some dyspnea on exertion. She is put on some weight. She continues to smoke. she does not have any significant chest discomfort.  Past Medical History  Diagnosis Date  . Other and unspecified ovarian cyst     left  . Other chest pain   . COPD (chronic obstructive pulmonary disease)   . Nontoxic multinodular goiter   . Tobacco use disorder   . Anxiety state, unspecified   . Unspecified sleep apnea     resolved s/p UPPP surgery  . Other and unspecified hyperlipidemia   . Depressive disorder, not elsewhere classified   . GERD (gastroesophageal reflux disease)   . Unspecified hypothyroidism       Surgical History:  Past Surgical History  Procedure Laterality Date  . Cardiac catheterization  08/12/06    neg. Dr. Welton Flakes, cardio  . Stress cardiolite  10/06/96    nl, EF 62%  . Stress myoview  10/25/03    Dr. Park Breed, nl  . Cholecystectomy    . Nasal polyp surgery      nasal ?  . Thyroidectomy  5/92   massive goiter  . Uvulopalatopharyngoplasty      and tonsilectomy. [name] 10/04  . Mri lumber spine      03/12/06-abn  . Mri pelvis  05/02/06    abn-[facility]o, neurosurg     Home Meds: Prior to Admission medications   Medication Sig Start Date End Date Taking? Authorizing Provider  buPROPion (WELLBUTRIN XL) 150 MG 24 hr tablet Take 150 mg by mouth daily.     Yes Historical Provider, MD  busPIRone (BUSPAR) 10 MG tablet Take 20 mg by mouth daily.     Yes Historical Provider, MD  esomeprazole (NEXIUM) 40 MG capsule Take 40 mg by mouth daily.     Yes Historical Provider, MD  levothyroxine (SYNTHROID, LEVOTHROID) 175 MCG tablet Take 175 mcg by mouth daily.     Yes Historical Provider, MD  propafenone (RYTHMOL) 225 MG tablet Take 225 mg by mouth 2 (two) times daily.     Yes Historical Provider, MD  aspirin 325 MG tablet Take 325 mg by mouth daily.      Historical Provider, MD     Allergies:  Allergies  Allergen Reactions  . Penicillins     REACTION: rash  . Sulfonamide Derivatives     REACTION: rash    History   Social History  . Marital Status: Married    Spouse Name: N/A    Number of Children: N/A  .  Years of Education: N/A   Occupational History  . Not on file.   Social History Main Topics  . Smoking status: Current Every Day Smoker  . Smokeless tobacco: Not on file  . Alcohol Use: Not on file  . Drug Use: Not on file  . Sexually Active: Not on file   Other Topics Concern  . Not on file   Social History Narrative   Widowed. 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      Family History  Problem Relation Age of Onset  . Heart attack      both sides  . Stroke      both sides  . Prostate cancer Neg Hx   . Colon cancer Neg Hx   . Depression      both sides     ROS:  Please see the history of present illness.     All other systems reviewed and negative.    Physical Exam   Blood pressure 126/74, pulse 68, height 5\' 5"  (1.651 m), weight 196 lb  6.4 oz (89.086 kg). General: Well developed, well nourished female in no acute distress. Head: Normocephalic, atraumatic, sclera non-icteric, no xanthomas, nares are without discharge. Smells of cigarettes EENT: normal Lymph Nodes:  none Back: without scoliosis/kyphosis , no CVA tendersness Neck: Negative for carotid bruits. JVD not elevated. Lungs: Clear bilaterally to auscultation without wheezes, rales, or rhonchi. Breathing is unlabored. Heart: RRR with S1 S2. No murmur , rubs, or gallops appreciated. Abdomen: Soft, non-tender, non-distended with normoactive bowel sounds. No hepatomegaly. No rebound/guarding. No obvious abdominal masses. Msk:  Strength and tone appear normal for age. Extremities: No clubbing or cyanosis. No  edema.  Distal pedal pulses are 2+ and equal bilaterally. Skin: Warm and Dry Neuro: Alert and oriented X 3. CN III-XII intact Grossly normal sensory and motor function . Psych:  Responds to questions appropriately with a normal affect.      Labs: Cardiac Enzymes No results found for this basename: CKTOTAL, CKMB, TROPONINI,  in the last 72 hours CBC Lab Results  Component Value Date   WBC 10.7* 07/03/2008   HGB 14.9 07/03/2008   HCT 43.8 07/03/2008   MCV 92.5 07/03/2008   PLT 216 07/03/2008   PROTIME: No results found for this basename: LABPROT, INR,  in the last 72 hours Chemistry No results found for this basename: NA, K, CL, CO2, BUN, CREATININE, CALCIUM, LABALBU, PROT, BILITOT, ALKPHOS, ALT, AST, GLUCOSE,  in the last 168 hours Lipids Lab Results  Component Value Date   CHOL  Value: 171        ATP III CLASSIFICATION:  <200     mg/dL   Desirable  161-096  mg/dL   Borderline High  >=045    mg/dL   High        4/0/9811   HDL 36* 05/09/2008   LDLCALC  Value: 107        Total Cholesterol/HDL:CHD Risk Coronary Heart Disease Risk Table                     Men   Women  1/2 Average Risk   3.4   3.3  Average Risk       5.0   4.4  2 X Average Risk   9.6   7.1  3 X  Average Risk  23.4   11.0        Use the calculated Patient Ratio above and the CHD Risk Table  to determine the patient's CHD Risk.        ATP III CLASSIFICATION (LDL):  <100     mg/dL   Optimal  161-096  mg/dL   Near or Above                    Optimal  130-159  mg/dL   Borderline  045-409  mg/dL   High  >811     mg/dL   Very High* 01/05/4781   TRIG 141 05/09/2008   BNP No results found for this basename: probnp   Miscellaneous Lab Results  Component Value Date   DDIMER  Value: 0.22        AT THE INHOUSE ESTABLISHED CUTOFF VALUE OF 0.48 ug/mL FEU, THIS ASSAY HAS BEEN DOCUMENTED IN THE LITERATURE TO HAVE 07/30/2007    Radiology/Studies:  No results found.  EKG:  Sinus rhythm at 58 Intervals 14/10/41 Axis is -37 Q waves in lead V1-V2 concern for septal MI   Assessment and Plan   [name]

## 2012-09-08 ENCOUNTER — Ambulatory Visit (HOSPITAL_COMMUNITY): Payer: 59 | Attending: Cardiology | Admitting: Radiology

## 2012-09-08 VITALS — BP 119/74 | Ht 65.0 in | Wt 197.0 lb

## 2012-09-08 DIAGNOSIS — R9431 Abnormal electrocardiogram [ECG] [EKG]: Secondary | ICD-10-CM | POA: Insufficient documentation

## 2012-09-08 DIAGNOSIS — F17200 Nicotine dependence, unspecified, uncomplicated: Secondary | ICD-10-CM | POA: Insufficient documentation

## 2012-09-08 DIAGNOSIS — R0989 Other specified symptoms and signs involving the circulatory and respiratory systems: Secondary | ICD-10-CM | POA: Insufficient documentation

## 2012-09-08 DIAGNOSIS — E785 Hyperlipidemia, unspecified: Secondary | ICD-10-CM | POA: Insufficient documentation

## 2012-09-08 DIAGNOSIS — J4489 Other specified chronic obstructive pulmonary disease: Secondary | ICD-10-CM | POA: Insufficient documentation

## 2012-09-08 DIAGNOSIS — R0609 Other forms of dyspnea: Secondary | ICD-10-CM | POA: Insufficient documentation

## 2012-09-08 DIAGNOSIS — R079 Chest pain, unspecified: Secondary | ICD-10-CM

## 2012-09-08 DIAGNOSIS — J449 Chronic obstructive pulmonary disease, unspecified: Secondary | ICD-10-CM | POA: Insufficient documentation

## 2012-09-08 DIAGNOSIS — I4891 Unspecified atrial fibrillation: Secondary | ICD-10-CM

## 2012-09-08 DIAGNOSIS — R0602 Shortness of breath: Secondary | ICD-10-CM

## 2012-09-08 DIAGNOSIS — Z8249 Family history of ischemic heart disease and other diseases of the circulatory system: Secondary | ICD-10-CM | POA: Insufficient documentation

## 2012-09-08 MED ORDER — TECHNETIUM TC 99M SESTAMIBI GENERIC - CARDIOLITE
11.0000 | Freq: Once | INTRAVENOUS | Status: AC | PRN
  Administered 2012-09-08: 11 via INTRAVENOUS

## 2012-09-08 MED ORDER — TECHNETIUM TC 99M SESTAMIBI GENERIC - CARDIOLITE
33.0000 | Freq: Once | INTRAVENOUS | Status: AC | PRN
  Administered 2012-09-08: 33 via INTRAVENOUS

## 2012-09-08 NOTE — Progress Notes (Signed)
[facility] SITE 3 NUCLEAR MED [address] [place], [place] [address] [phone]    Cardiology Nuclear Med Study  [name] is a 57 y.o. female     MRN : [id number]     DOB: [birth year]  Procedure Date: 09/08/2012  Nuclear Med Background Indication for Stress Test:  Evaluation for Ischemia and Abnormal EKG History:  COPD and AFIB, 2005 MPS: NL done in [place], 2008, Heart Cath NL, 2009 ECHO: EF: 60%  Cardiac Risk Factors: Family History - CAD, Lipids and Smoker  Symptoms:  Chest Pain and DOE   Nuclear Pre-Procedure Caffeine/Decaff Intake:  8:30pm NPO After: 8:30pm   Lungs:  clear O2 Sat: 98% on room air. IV 0.9% NS with Angio Cath:  22g  IV Site: R Antecubital x 1, tolerated well IV Started by:  Irean Hong, RN  Chest Size (in):  36 Cup Size: B  Height: 5\' 5"  (1.651 m)  Weight:  197 lb (89.359 kg)  BMI:  Body mass index is 32.78 kg/(m^2). Tech Comments:  No medications today    Nuclear Med Study 1 or 2 day study: 1 day  Stress Test Type:  Stress  Reading MD: Marca Ancona, MD  Order Authorizing Provider:  Sherryl Manges, MD  Resting Radionuclide: Technetium 56m Sestamibi  Resting Radionuclide Dose: 11.0 mCi   Stress Radionuclide:  Technetium 44m Sestamibi  Stress Radionuclide Dose: 33.0 mCi           Stress Protocol Rest HR: 67 Stress HR: 150  Rest BP: 119/74 Stress BP: 164/94  Exercise Time (min): 7:30 METS: 9.30   Predicted Max HR: 164 bpm % Max HR: 91.46 bpm Rate Pressure Product: 21308   Dose of Adenosine (mg):  n/a Dose of Lexiscan: n/a mg  Dose of Atropine (mg): n/a Dose of Dobutamine: n/a mcg/kg/min (at max HR)  Stress Test Technologist: Milana Na, EMT-P  Nuclear Technologist:  Domenic Polite, CNMT     Rest Procedure:  Myocardial perfusion imaging was performed at rest 45 minutes following the intravenous administration of Technetium 36m Sestamibi. Rest ECG: NSR - Normal EKG  Stress Procedure:  The patient exercised on the  treadmill utilizing the Bruce Protocol for 7:30 minutes. The patient stopped due to fatigue, rare pvcs, and denied any chest pain.  Technetium 51m Sestamibi was injected at peak exercise and myocardial perfusion imaging was performed after a brief delay. Stress ECG: No significant change from baseline ECG  QPS Raw Data Images:  Normal; no motion artifact; normal heart/lung ratio. Stress Images:  Small, mild apical perfusion defect.  Rest Images:  Small, mild apical perfusion defect.  Subtraction (SDS):  Fixed small mild apical perfusion defect.  Transient Ischemic Dilatation (Normal <1.22):  0.91 Lung/Heart Ratio (Normal <0.45):  0.30  Quantitative Gated Spect Images QGS EDV:  92 ml QGS ESV:  23 ml  Impression Exercise Capacity:  Fair exercise capacity. BP Response:  Normal blood pressure response. Clinical Symptoms:  No chest pain, stopped due to fatigue.  ECG Impression:  No significant ST segment change suggestive of ischemia. Comparison with Prior Nuclear Study: No images to compare  Overall Impression:  Low risk stress nuclear study.  There is a small, mild fixed apical perfusion defect with normal wall motion that seems most likely to be due to soft tissue attenuation.   LV Ejection Fraction: 75%.  LV Wall Motion:  NL LV Function; NL Wall Motion  Marca Ancona 09/08/2012

## 2012-09-18 ENCOUNTER — Telehealth: Payer: Self-pay | Admitting: Internal Medicine

## 2012-09-18 NOTE — Telephone Encounter (Signed)
New Problem:    Patient called in returning a call regarding her latest Stress Test results.  Please call back.

## 2012-09-18 NOTE — Telephone Encounter (Signed)
Patient called myoview results given. 

## 2013-03-18 ENCOUNTER — Emergency Department: Payer: Self-pay | Admitting: Emergency Medicine

## 2013-09-18 ENCOUNTER — Emergency Department (HOSPITAL_COMMUNITY)
Admission: EM | Admit: 2013-09-18 | Discharge: 2013-09-18 | Disposition: A | Discharge status: Home / Self Care | Payer: 59 | Attending: Emergency Medicine | Admitting: Emergency Medicine

## 2013-09-18 ENCOUNTER — Encounter (HOSPITAL_COMMUNITY): Payer: Self-pay | Admitting: Emergency Medicine

## 2013-09-18 ENCOUNTER — Emergency Department (HOSPITAL_COMMUNITY): Payer: 59

## 2013-09-18 DIAGNOSIS — Z9889 Other specified postprocedural states: Secondary | ICD-10-CM | POA: Insufficient documentation

## 2013-09-18 DIAGNOSIS — E042 Nontoxic multinodular goiter: Secondary | ICD-10-CM | POA: Insufficient documentation

## 2013-09-18 DIAGNOSIS — F329 Major depressive disorder, single episode, unspecified: Secondary | ICD-10-CM | POA: Insufficient documentation

## 2013-09-18 DIAGNOSIS — F411 Generalized anxiety disorder: Secondary | ICD-10-CM | POA: Insufficient documentation

## 2013-09-18 DIAGNOSIS — I4891 Unspecified atrial fibrillation: Secondary | ICD-10-CM | POA: Insufficient documentation

## 2013-09-18 DIAGNOSIS — K219 Gastro-esophageal reflux disease without esophagitis: Secondary | ICD-10-CM | POA: Insufficient documentation

## 2013-09-18 DIAGNOSIS — R55 Syncope and collapse: Secondary | ICD-10-CM | POA: Insufficient documentation

## 2013-09-18 DIAGNOSIS — Z8742 Personal history of other diseases of the female genital tract: Secondary | ICD-10-CM | POA: Insufficient documentation

## 2013-09-18 DIAGNOSIS — J4489 Other specified chronic obstructive pulmonary disease: Secondary | ICD-10-CM | POA: Insufficient documentation

## 2013-09-18 DIAGNOSIS — J449 Chronic obstructive pulmonary disease, unspecified: Secondary | ICD-10-CM | POA: Insufficient documentation

## 2013-09-18 DIAGNOSIS — E039 Hypothyroidism, unspecified: Secondary | ICD-10-CM | POA: Insufficient documentation

## 2013-09-18 DIAGNOSIS — Z79899 Other long term (current) drug therapy: Secondary | ICD-10-CM | POA: Insufficient documentation

## 2013-09-18 DIAGNOSIS — F3289 Other specified depressive episodes: Secondary | ICD-10-CM | POA: Insufficient documentation

## 2013-09-18 DIAGNOSIS — R002 Palpitations: Secondary | ICD-10-CM | POA: Insufficient documentation

## 2013-09-18 DIAGNOSIS — F17200 Nicotine dependence, unspecified, uncomplicated: Secondary | ICD-10-CM | POA: Insufficient documentation

## 2013-09-18 LAB — BASIC METABOLIC PANEL
BUN: 11 mg/dL (ref 6–23)
CALCIUM: 9.6 mg/dL (ref 8.4–10.5)
CO2: 25 mEq/L (ref 19–32)
Chloride: 105 mEq/L (ref 96–112)
Creatinine, Ser: 0.95 mg/dL (ref 0.50–1.10)
GFR, EST AFRICAN AMERICAN: 76 mL/min — AB (ref >90)
GFR, EST NON AFRICAN AMERICAN: 65 mL/min — AB (ref >90)
Glucose, Bld: 91 mg/dL (ref 70–99)
POTASSIUM: 4.5 meq/L (ref 3.7–5.3)
SODIUM: 145 meq/L (ref 137–147)

## 2013-09-18 LAB — CBC
HCT: 44 % (ref 36.0–46.0)
Hemoglobin: 15 g/dL (ref 12.0–15.0)
MCH: 30.9 pg (ref 26.0–34.0)
MCHC: 34.1 g/dL (ref 30.0–36.0)
MCV: 90.7 fL (ref 78.0–100.0)
Platelets: 232 10*3/uL (ref 150–400)
RBC: 4.85 MIL/uL (ref 3.87–5.11)
RDW: 12.9 % (ref 11.5–15.5)
WBC: 7.9 10*3/uL (ref 4.0–10.5)

## 2013-09-18 LAB — I-STAT TROPONIN, ED: Troponin i, poc: 0 ng/mL (ref 0.00–0.08)

## 2013-09-18 NOTE — ED Notes (Signed)
Pt now in NSR, states she can feel when she "goes back into rhythm".  Denies any complaints at this time.

## 2013-09-18 NOTE — ED Notes (Signed)
Pt states "ive been in and out of A fib since last night, i can feel it." she reports a history of Atrial fibrillation "that has been under control with my daily medication." states she noticed the atrial fib after a stressful event last night. She denies pain now but does feel some SOB

## 2013-09-18 NOTE — Discharge Instructions (Signed)
Atrial Fibrillation °Atrial fibrillation is a condition that causes your heart to beat irregularly. It may also cause your heart to beat faster than normal. Atrial fibrillation can prevent your heart from pumping blood normally. It increases your risk of stroke and heart problems. °HOME CARE °· Take medications as told by your doctor. °· Only take medications that your doctor says are safe. Some medications can make the condition worse or happen again. °· If blood thinners were prescribed by your doctor, take them exactly as told. Too much can cause bleeding. Too little and you will not have the needed protection against stroke and other problems. °· Perform blood tests at home if told by your doctor. °· Perform blood tests exactly as told by your doctor. °· Do not drink alcohol. °· Do not drink beverages with caffeine such as coffee, soda, and some teas. °· Maintain a healthy weight. °· Do not use diet pills unless your doctor says they are safe. They may make heart problems worse. °· Follow diet instructions as told by your doctor. °· Exercise regularly as told by your doctor. °· Keep all follow-up appointments. °GET HELP RIGHT AWAY IF:  °· You have chest or belly (abdominal) pain. °· You feel sick to your stomach (nauseous) °· You suddenly have swollen feet and ankles. °· You feel dizzy. °· You face, arms, or legs feel numb or weak. °· There is a change in your vision or speech. °· You notice a change in the speed, rhythm, or strength of your heartbeat. °· You suddenly begin peeing (urinating) more often. °· You get tired more easily when moving or exercising. °MAKE SURE YOU:  °· Understand these instructions. °· Will watch your condition. °· Will get help right away if you are not doing well or get worse. °Document Released: 01/31/2008 Document Revised: 08/18/2012 Document Reviewed: 06/03/2012 °ExitCare® Patient Information ©2014 [employer]. ° °

## 2013-09-18 NOTE — ED Provider Notes (Signed)
This patient was seen with the resident physician, Dr. Ronald LoboKunz.  The documentation accurately reflects the patient's Emergency Department course.  On my exam the patient was in no distress.  With her history of atrial fibrillation, she had labs, studies to determine if there was a precipitant for her episode.  Throughout the patient's course she remained hemodynamically stable, and in no distress.  She was discharged in stable condition. I saw EKG, agree with interpretation.   Gerhard Munchobert Lisandro Meggett, MD 09/18/13 2351

## 2013-09-18 NOTE — ED Provider Notes (Signed)
CSN: [id number]     Arrival date & time 09/18/13  1610 History   First MD Initiated Contact with Patient 09/18/13 1650     Chief Complaint  Patient presents with  . Atrial Fibrillation     (Consider location/radiation/quality/duration/timing/severity/associated sxs/prior Treatment) Patient is a 58 y.o. female presenting with palpitations. The history is provided by the patient and medical records. No language interpreter was used.  Palpitations Palpitations quality:  Irregular Onset quality:  Sudden Duration:  19 hours Timing:  Constant Progression:  Resolved Chronicity:  Recurrent Context: anxiety (patient got into arguement with co worker)   Relieved by: patient reports that while driving here her symptoms resolved. Associated symptoms: chest pain, near-syncope and shortness of breath   Associated symptoms: no diaphoresis and no weakness   Risk factors: hx of atrial fibrillation     Past Medical History  Diagnosis Date  . Other and unspecified ovarian cyst     left  . Other chest pain   . COPD (chronic obstructive pulmonary disease)   . Nontoxic multinodular goiter   . Tobacco use disorder   . Anxiety state, unspecified   . Unspecified sleep apnea     resolved s/p UPPP surgery  . Other and unspecified hyperlipidemia   . Depressive disorder, not elsewhere classified   . GERD (gastroesophageal reflux disease)   . Unspecified hypothyroidism   . Atrial fibrillation    Past Surgical History  Procedure Laterality Date  . Cardiac catheterization  08/12/06    neg. Dr. Welton FlakesKhan, cardio  . Stress cardiolite  10/06/96    nl, EF 62%  . Stress myoview  10/25/03    Dr. Park BreedKahn, nl  . Cholecystectomy    . Nasal polyp surgery      nasal ?  . Thyroidectomy  5/92    massive goiter  . Uvulopalatopharyngoplasty      and tonsilectomy. [name] 10/04  . Mri lumber spine      03/12/06-abn  . Mri pelvis  05/02/06    abn-[facility]o, neurosurg   Family History  Problem Relation Age of  Onset  . Heart attack      both sides  . Stroke      both sides  . Prostate cancer Neg Hx   . Colon cancer Neg Hx   . Depression      both sides   History  Substance Use Topics  . Smoking status: Current Every Day Smoker    Types: Cigarettes  . Smokeless tobacco: Not on file  . Alcohol Use: Yes   OB History   Grav Para Term Preterm Abortions TAB SAB Ect Mult Living                 Review of Systems  Constitutional: Negative for diaphoresis.  Respiratory: Positive for shortness of breath.   Cardiovascular: Positive for chest pain, palpitations and near-syncope.  All other systems reviewed and are negative.     Allergies  Penicillins and Sulfonamide derivatives  Home Medications   Prior to Admission medications   Medication Sig Start Date End Date Taking? Authorizing Provider  buPROPion (WELLBUTRIN XL) 150 MG 24 hr tablet Take 150 mg by mouth daily.      Historical Provider, MD  busPIRone (BUSPAR) 10 MG tablet Take 20 mg by mouth daily.      Historical Provider, MD  esomeprazole (NEXIUM) 40 MG capsule Take 40 mg by mouth daily.      Historical Provider, MD  levothyroxine (SYNTHROID, LEVOTHROID) 175 MCG  tablet Take 175 mcg by mouth daily.      Historical Provider, MD  propafenone (RYTHMOL) 225 MG tablet Take 225 mg by mouth 2 (two) times daily.      Historical Provider, MD   BP 143/89  Pulse 87  Temp(Src) 98.7 F (37.1 C) (Oral)  Resp 18  SpO2 98% Physical Exam  Nursing note and vitals reviewed. Constitutional: She is oriented to person, place, and time. She appears well-developed and well-nourished.  HENT:  Head: Normocephalic and atraumatic.  Right Ear: External ear normal.  Left Ear: External ear normal.  Nose: Nose normal.  Mouth/Throat: Oropharynx is clear and moist.  Eyes: Conjunctivae and EOM are normal. Pupils are equal, round, and reactive to light.  Neck: Normal range of motion. Neck supple.  Cardiovascular: Normal rate, regular rhythm, normal heart  sounds and intact distal pulses.   Pulmonary/Chest: Effort normal and breath sounds normal. No respiratory distress. She exhibits no tenderness.  Abdominal: Soft. Bowel sounds are normal.  Musculoskeletal: Normal range of motion. She exhibits no edema and no tenderness.  Neurological: She is alert and oriented to person, place, and time. She has normal reflexes.  Skin: Skin is warm and dry.  Psychiatric: She has a normal mood and affect.    ED Course  Procedures (including critical care time) Labs Review Labs Reviewed  BASIC METABOLIC PANEL - Abnormal; Notable for the following:    GFR calc non Af Amer 65 (*)    GFR calc Af Amer 76 (*)    All other components within normal limits  CBC  I-STAT TROPOININ, ED    Imaging Review Dg Chest 2 View  09/18/2013   CLINICAL DATA:  Atrial fibrillation.  Tobacco use.  EXAM: CHEST  2 VIEW  COMPARISON:  07/03/2008  FINDINGS: Mild kyphosis at the thoracolumbar junction.  Cardiac and mediastinal margins appear normal. Faint interstitial accentuation likely related to tobacco use.  IMPRESSION: 1. Faint interstitial accentuation may relate to tobacco use. No acute findings.   Electronically Signed   By: Herbie BaltimoreWalt  Liebkemann M.D.   On: 09/18/2013 17:29     EKG Interpretation None      Date: 09/18/2013  Rate: 96  Rhythm: normal sinus rhythm  QRS Axis: normal  Intervals: normal  ST/T Wave abnormalities: normal  Conduction Disutrbances:left anterior fascicular block  Narrative Interpretation:   Old EKG Reviewed: unchanged   MDM   Final diagnoses:  Palpitations    Patient is a 58 year old female with past medical history as above and relevant for atrial fibrillation currently on rhythmol who presents with complaints of palpitations. Patient reports that after a argument with a coworker last night she began having palpitations. However these palpitations resolved just prior to arrival in the emergency department. Her vital signs upon arrival in the  emergency department where remarkable for no fever, no tachycardia, no hypoxia, and no hypotension. Physical exam as above and remarkable for no cardiovascular abnormalities and no signs of volume overload. EKG showed sinus rhythm and no signs of acute ischemia. Labs obtained in triage remarkable for no electrolyte abnormalities, troponin within normal limits, and no hypoglycemia. Chest x-ray obtained showed no evidence of cardiomegaly or pneumonia. Given patient is asymptomatic now, in sinus rhythm, and workup negative for acute pathology it was felt that discharge with close PCP/cardiology followup was appropriate. Will give strict return precautions.    Johnney Ouerek Braniyah Besse, MD 09/18/13 [id number]

## 2013-09-18 NOTE — ED Notes (Signed)
Patient transported to X-ray 

## 2014-02-15 DIAGNOSIS — G4733 Obstructive sleep apnea (adult) (pediatric): Secondary | ICD-10-CM | POA: Insufficient documentation

## 2014-04-27 ENCOUNTER — Emergency Department: Payer: Self-pay | Admitting: Emergency Medicine

## 2014-05-05 ENCOUNTER — Ambulatory Visit: Payer: Self-pay | Admitting: Family Medicine

## 2014-05-12 ENCOUNTER — Other Ambulatory Visit: Payer: Self-pay | Admitting: Family Medicine

## 2014-05-12 DIAGNOSIS — S22080A Wedge compression fracture of T11-T12 vertebra, initial encounter for closed fracture: Secondary | ICD-10-CM

## 2014-05-20 ENCOUNTER — Ambulatory Visit
Admission: RE | Admit: 2014-05-20 | Discharge: 2014-05-20 | Disposition: A | Discharge status: Home / Self Care | Payer: Self-pay | Source: Ambulatory Visit | Attending: Family Medicine | Admitting: Family Medicine

## 2014-05-20 DIAGNOSIS — S22080A Wedge compression fracture of T11-T12 vertebra, initial encounter for closed fracture: Secondary | ICD-10-CM

## 2014-07-19 ENCOUNTER — Encounter (INDEPENDENT_AMBULATORY_CARE_PROVIDER_SITE_OTHER): Payer: Self-pay

## 2014-07-19 ENCOUNTER — Other Ambulatory Visit (HOSPITAL_COMMUNITY): Payer: 59 | Attending: Psychiatry | Admitting: Psychiatry

## 2014-07-19 ENCOUNTER — Encounter (HOSPITAL_COMMUNITY): Payer: Self-pay | Admitting: Psychiatry

## 2014-07-19 DIAGNOSIS — E039 Hypothyroidism, unspecified: Secondary | ICD-10-CM | POA: Insufficient documentation

## 2014-07-19 DIAGNOSIS — K219 Gastro-esophageal reflux disease without esophagitis: Secondary | ICD-10-CM | POA: Diagnosis not present

## 2014-07-19 DIAGNOSIS — I4891 Unspecified atrial fibrillation: Secondary | ICD-10-CM | POA: Diagnosis not present

## 2014-07-19 DIAGNOSIS — E785 Hyperlipidemia, unspecified: Secondary | ICD-10-CM | POA: Insufficient documentation

## 2014-07-19 DIAGNOSIS — F411 Generalized anxiety disorder: Secondary | ICD-10-CM | POA: Insufficient documentation

## 2014-07-19 DIAGNOSIS — F4310 Post-traumatic stress disorder, unspecified: Secondary | ICD-10-CM | POA: Insufficient documentation

## 2014-07-19 DIAGNOSIS — F331 Major depressive disorder, recurrent, moderate: Secondary | ICD-10-CM

## 2014-07-19 DIAGNOSIS — J449 Chronic obstructive pulmonary disease, unspecified: Secondary | ICD-10-CM | POA: Diagnosis not present
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
## 2014-07-19 NOTE — Progress Notes (Signed)
    Daily Group Progress Note  Program: IOP  Group Time: 9:00-10:30  Participation Level: Active  Behavioral Response: Appropriate  Type of Therapy:  Psycho-education Group  Summary of Progress: Pt. Met with case Freight forwarder.      Group Time:  10:30-12:00  Participation Level:  Active  Behavioral Response: Appropriate  Type of Therapy: Group Therapy  Summary of Progress: Pt. Reported history of work related stress, low frustration tolerance, and sleep disturbance.   Nancie Neas, LPC

## 2014-07-20 ENCOUNTER — Ambulatory Visit: Payer: Self-pay | Admitting: Family Medicine

## 2014-07-20 ENCOUNTER — Other Ambulatory Visit (HOSPITAL_COMMUNITY): Payer: 59 | Attending: Psychiatry | Admitting: Psychiatry

## 2014-07-20 ENCOUNTER — Encounter (HOSPITAL_COMMUNITY): Payer: Self-pay | Admitting: Psychiatry

## 2014-07-20 DIAGNOSIS — F419 Anxiety disorder, unspecified: Secondary | ICD-10-CM | POA: Insufficient documentation

## 2014-07-20 DIAGNOSIS — F331 Major depressive disorder, recurrent, moderate: Secondary | ICD-10-CM | POA: Insufficient documentation
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
## 2014-07-21 ENCOUNTER — Other Ambulatory Visit (HOSPITAL_COMMUNITY): Payer: 59 | Admitting: Psychiatry

## 2014-07-21 DIAGNOSIS — F331 Major depressive disorder, recurrent, moderate: Secondary | ICD-10-CM | POA: Diagnosis not present
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
## 2014-07-21 NOTE — Progress Notes (Signed)
    Daily Group Progress Note  Program: IOP  Group Time: 9:00-10:30  Participation Level: Active  Behavioral Response: Appropriate  Type of Therapy:  Group Therapy  Summary of Progress: Pt. Was alert, attentive, talkative regarding history of setting rigid boundaries in relationships with adult children. Pt. Continues to look for strategies for managing work stress.     Group Time: 10:30-12:00  Participation Level:  Active  Behavioral Response: Appropriate  Type of Therapy: Psycho-education Group  Summary of Progress: Pt. Was excused from psychoeducation portion of group due to doctor's appointment.   Shaune PollackBrown, Jennifer B, LPC

## 2014-07-22 ENCOUNTER — Telehealth (HOSPITAL_COMMUNITY): Payer: Self-pay | Admitting: Psychiatry

## 2014-07-22 ENCOUNTER — Other Ambulatory Visit (HOSPITAL_COMMUNITY): Payer: 59

## 2014-07-23 ENCOUNTER — Other Ambulatory Visit (HOSPITAL_COMMUNITY): Payer: 59 | Admitting: Psychiatry

## 2014-07-23 DIAGNOSIS — F331 Major depressive disorder, recurrent, moderate: Secondary | ICD-10-CM | POA: Diagnosis not present

## 2014-07-23 NOTE — Progress Notes (Signed)
    Daily Group Progress Note  Program: IOP  Group Time: 9:00-10:30  Participation Level: Active  Behavioral Response: Appropriate  Type of Therapy:  Group Therapy  Summary of Progress: Pt. Smiled and laughed appropriately. Pt. Reported that she was challenged by neck and back nerve pain. Pt. Reported frustration with her employer regarding job status. Pt. Discussed interests and explored possibility of career change.      Group Time: 10:30-12:00  Participation Level:  Active  Behavioral Response: Appropriate  Type of Therapy: Psycho-education Group  Summary of Progress: Pt. Participated in grief and loss group facilitated by Theda BelfastBob Hamilton.   Shaune PollackBrown, Jennifer B, LPC

## 2014-07-26 ENCOUNTER — Other Ambulatory Visit (HOSPITAL_COMMUNITY): Payer: 59 | Admitting: Psychiatry

## 2014-07-26 DIAGNOSIS — F331 Major depressive disorder, recurrent, moderate: Secondary | ICD-10-CM

## 2014-07-26 NOTE — Progress Notes (Signed)
    Daily Group Progress Note  Program: IOP  Group Time: 9:00-10:30  Participation Level: Active  Behavioral Response: Appropriate  Type of Therapy:  Psycho-education Group  Summary of Progress: Pt. Participated in medication management group with Michelle NasutiElena.     Group Time: 10:30-12:00  Participation Level:  Active  Behavioral Response: Appropriate  Type of Therapy: Group Therapy  Summary of Progress: Pt. Watched [employer]rene Dontavious Emily video about developing vulnerability as resistance to shame, and numbing behaviors that allow us to cope with shame.   Shaune PollackBrown, Bryant Lipps B, LPC

## 2014-07-27 ENCOUNTER — Other Ambulatory Visit (HOSPITAL_COMMUNITY): Payer: 59 | Admitting: Psychiatry

## 2014-07-27 DIAGNOSIS — F331 Major depressive disorder, recurrent, moderate: Secondary | ICD-10-CM

## 2014-07-27 NOTE — Progress Notes (Signed)
    Daily Group Progress Note  Program: IOP  Group Time: 9:00-10:30  Participation Level: Active  Behavioral Response: Appropriate  Type of Therapy:  Group Therapy  Summary of Progress: Pt. Presented with brightened affect, talkative, appropriate eye contact. Pt. Participated in guided visualization and breathing exercise.      Group Time: 10:30-12:00  Participation Level:  Active  Behavioral Response: Appropriate  Type of Therapy: Psycho-education Group  Summary of Progress: Pt. Participated in group facilitated by Alyse with the mental health association.   Shaune PollackBrown, Stclair Szymborski B, LPC

## 2014-07-28 ENCOUNTER — Other Ambulatory Visit (HOSPITAL_COMMUNITY): Payer: 59 | Admitting: Psychiatry

## 2014-07-28 DIAGNOSIS — F331 Major depressive disorder, recurrent, moderate: Secondary | ICD-10-CM

## 2014-07-28 NOTE — Progress Notes (Signed)
    Daily Group Progress Note  Program: IOP  Group Time: 9:00-10:30  Participation Level: Active  Behavioral Response: Appropriate  Type of Therapy:  Group Therapy  Summary of Progress: Pt. Presented with brightened mood, talkative, makes appropriate eye contact. Pt. Prepared for discharge tomorrow. Pt. Reports that she received phone call from employer and plans to return to work on Monday.      Group Time: 10:30-12:00  Participation Level:  Active  Behavioral Response: Appropriate  Type of Therapy: Psycho-education Group  Summary of Progress: Pt. Watched Clovia CuffKristin Neff self-compassion video, participated in discussion of developing self-kindness, recognizing common humanity, and role of mindfulness in emotion regulation.  Shaune PollackBrown, Jennifer B, LPC

## 2014-07-28 NOTE — Patient Instructions (Signed)
Patient will complete MH-IOP tomorrow.  Will follow up with PCP for medication management and Jovea Herbin, LCSW for therapy.  Encouraged support groups.

## 2014-07-28 NOTE — Progress Notes (Signed)
[name], female   DOB: 06/09/1955, 59 y.o.   MRN: [id number] Discharge Note  Patient:  [name] is an 59 y.o., female DOB:  01/05/1956  Date of Admission:  07/19/2014  Date of Discharge:  07/29/2014  Reason for Admission:depression and anxiety  IOP Course:  Ms [name] was an active participant in group and used all that was available to her.  She said the group experience actually went beyond her expectations.  She is less depressed and anxious and is ready to return to work after discharge from the program.  Mental Status at Discharge:alert, oriented, euthymic. She still has some depression and anxiety but can manage it. No psychosis.  Lab Results: No results found for this or any previous visit (from the past 48 hour(s)).   Current outpatient prescriptions:  .  buPROPion (WELLBUTRIN XL) 150 MG 24 hr tablet, Take 150 mg by mouth daily.  , Disp: , Rfl:  .  busPIRone (BUSPAR) 15 MG tablet, Take 15 mg by mouth 2 (two) times daily., Disp: , Rfl:  .  esomeprazole (NEXIUM) 40 MG capsule, Take 40 mg by mouth daily.  , Disp: , Rfl:  .  levothyroxine (SYNTHROID, LEVOTHROID) 175 MCG tablet, Take 175 mcg by mouth daily.  , Disp: , Rfl:  .  propafenone (RYTHMOL) 225 MG tablet, Take 225 mg by mouth 2 (two) times daily.  , Disp: , Rfl:   Axis Diagnosis:  Major depression, recurrent, moderate   Level of Care:  IOP  Discharge destination:  Other:  return to outpatient therapy and med management  Is patient on multiple antipsychotic therapies at discharge:  No    Has Patient had three or more failed trials of antipsychotic monotherapy by history:  Negative  Patient phone:  [phone] (home)  Patient address:   [address] [place] [id number],   Follow-up recommendations:  Activity:  continue current activity level Diet:  continue current diet  Comments:  Doing very well, relieved that she still has a job and is looking forward to returning to it  The patient  received suicide prevention pamphlet:  Yes Belongings returned:  na  Benjaman PottAYLOR,Tip Atienza D 07/28/2014, 10:06 AM

## 2014-07-29 ENCOUNTER — Other Ambulatory Visit (HOSPITAL_COMMUNITY): Payer: 59 | Admitting: Psychiatry

## 2014-07-29 DIAGNOSIS — F331 Major depressive disorder, recurrent, moderate: Secondary | ICD-10-CM

## 2014-07-30 ENCOUNTER — Other Ambulatory Visit (HOSPITAL_COMMUNITY): Payer: 59

## 2014-07-30 NOTE — Progress Notes (Signed)
    Daily Group Progress Note  Program: IOP  Group Time: 9:00-10:30  Participation Level: Active  Behavioral Response: Appropriate  Type of Therapy: Group Therapy  Summary of Progress: Pt. Presented as alert and attentive, less guarded than yesterday. Pt. Discussed discipline problems with her 59 year old and 9 year old son and concerns about being disrespected by them.      Group Time: 10:30-12:00  Participation Level: Active  Behavioral Response: Appropriate  Type of Therapy: Psycho-education Group  Summary of Progress: Pt. Participated in discussion about feeling of control over life situations. Pt. Verbalized awareness about control she has been demonstrating in relationships in order to avoid being hurt.  Brown, [name], [name] 
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
## 2014-08-02 ENCOUNTER — Other Ambulatory Visit (HOSPITAL_COMMUNITY): Payer: 59

## 2014-08-03 ENCOUNTER — Other Ambulatory Visit (HOSPITAL_COMMUNITY): Payer: 59

## 2014-08-04 ENCOUNTER — Other Ambulatory Visit (HOSPITAL_COMMUNITY): Payer: 59

## 2014-08-05 ENCOUNTER — Other Ambulatory Visit: Payer: Self-pay

## 2014-08-05 ENCOUNTER — Other Ambulatory Visit (HOSPITAL_COMMUNITY): Payer: 59

## 2014-08-05 MED ORDER — PROPAFENONE HCL 225 MG PO TABS: 225.0000 mg | ORAL_TABLET | Freq: Two times a day (BID) | ORAL | Status: DC

## 2014-08-06 ENCOUNTER — Other Ambulatory Visit (HOSPITAL_COMMUNITY): Payer: 59

## 2014-08-09 ENCOUNTER — Other Ambulatory Visit (HOSPITAL_COMMUNITY): Payer: 59

## 2014-08-25 DIAGNOSIS — F32A Depression, unspecified: Secondary | ICD-10-CM | POA: Insufficient documentation

## 2014-08-25 DIAGNOSIS — K219 Gastro-esophageal reflux disease without esophagitis: Secondary | ICD-10-CM | POA: Insufficient documentation

## 2014-08-25 DIAGNOSIS — Z720 Tobacco use: Secondary | ICD-10-CM | POA: Insufficient documentation

## 2014-08-25 DIAGNOSIS — F329 Major depressive disorder, single episode, unspecified: Secondary | ICD-10-CM | POA: Insufficient documentation

## 2014-08-25 DIAGNOSIS — G43909 Migraine, unspecified, not intractable, without status migrainosus: Secondary | ICD-10-CM | POA: Insufficient documentation

## 2014-08-25 DIAGNOSIS — E039 Hypothyroidism, unspecified: Secondary | ICD-10-CM | POA: Insufficient documentation

## 2014-08-30 ENCOUNTER — Encounter: Payer: Self-pay | Admitting: Nurse Practitioner

## 2014-08-30 ENCOUNTER — Ambulatory Visit (INDEPENDENT_AMBULATORY_CARE_PROVIDER_SITE_OTHER): Payer: 59 | Admitting: Nurse Practitioner

## 2014-08-30 VITALS — BP 134/88 | HR 71 | Ht 65.0 in | Wt 203.0 lb

## 2014-08-30 DIAGNOSIS — Z720 Tobacco use: Secondary | ICD-10-CM | POA: Diagnosis not present

## 2014-08-30 DIAGNOSIS — I4891 Unspecified atrial fibrillation: Secondary | ICD-10-CM

## 2014-08-30 DIAGNOSIS — I480 Paroxysmal atrial fibrillation: Secondary | ICD-10-CM | POA: Diagnosis not present

## 2014-08-30 MED ORDER — PROPAFENONE HCL 225 MG PO TABS: 225.0000 mg | ORAL_TABLET | Freq: Two times a day (BID) | ORAL | Status: DC

## 2014-08-30 NOTE — Patient Instructions (Addendum)
Medication Instructions:  Your physician recommends that you continue on your current medications as directed. Please refer to the Current Medication list given to you today.   Labwork: NONE  Testing/Procedures: NONE  Follow-Up: Your physician wants you to follow-up in: 1 year with Dr. Graciela HusbandsKlein in [place]. You will receive a reminder letter in the mail two months in advance. If you don't receive a letter, please call our office to schedule the follow-up appointment.   Any Other Special Instructions Will Be Listed Below (If Applicable).

## 2014-08-30 NOTE — Progress Notes (Signed)
Patient Name: [name] Date of Encounter: 08/30/2014  Primary Care Provider:  Vonita Moss, MD Primary Cardiologist:  Odessa Fleming, MD   Chief Complaint  59 year old female with a history of paroxysmal atrial fibrillation who presents for follow-up.  Past Medical History   Past Medical History  Diagnosis Date  . Other and unspecified ovarian cyst     left  . Other chest pain   . COPD (chronic obstructive pulmonary disease)   . Tobacco use disorder   . Anxiety state, unspecified   . Unspecified sleep apnea     resolved s/p UPPP surgery  . Other and unspecified hyperlipidemia   . PAF (paroxysmal atrial fibrillation)     a. 07/2007 Echo: EF 65%, mildly dil LA;  b. currently on propafenone;  c. CHA2DS2VASc = 1 (gender)-->No anticoagulation.  Marland Kitchen PTSD (post-traumatic stress disorder)   . Depression   . GERD (gastroesophageal reflux disease)   . Nontoxic multinodular goiter   . Unspecified hypothyroidism   . Migraines   . Tobacco use   . Vitamin B12 deficiency   . CTS (carpal tunnel syndrome)   . Neck pain   . Back pain   . Chest pain     a. 2008 Cath: reportedly nl;  b. 09/2012 Lexi MV: EF 75%, soft tissue attenuation->Low risk.   Past Surgical History  Procedure Laterality Date  . Cardiac catheterization  08/12/06    neg. Dr. Welton Flakes, cardio  . Stress cardiolite  10/06/96    nl, EF 62%  . Stress myoview  10/25/03    Dr. Park Breed, nl  . Cholecystectomy    . Nasal polyp surgery      nasal ?  . Thyroidectomy  5/92    massive goiter  . Uvulopalatopharyngoplasty      and tonsilectomy. [name] 10/04  . Mri lumber spine      03/12/06-abn  . Mri pelvis  05/02/06    abn-[facility], neurosurg  . Parathyroidectomy    . Bladder tac    . Foot surgery      bone removal from her foot  . Tubal ligation  1988  . Tonsillectomy      Allergies  Allergies  Allergen Reactions  . Penicillins Rash  . Sulfonamide Derivatives Rash    HPI  59 year old female with the above  complex problem list. She is a long history of paroxysmal A. fib and has previously been treated with digoxin, Inderal, and diltiazem without much success. She was later started on Rythmol and ever since has been doing quite well. She was last seen in clinic by Dr. Graciela Husbands in 2014. She says that she continues to do well and experiences very infrequent and brief paroxysms of atrial fibrillation, often occurring less than once a month and lasting only 1-2 minutes. Episodes are most likely to occur during periods of stress. 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 She is not on oral anticoagulation in the setting of a CHA2DS2VASc of 14 gender only.  She denies chest pain, dyspnea, pnd, orthopnea, n, v, dizziness, syncope, edema, weight gain, or early satiety.   Home Medications  Prior to Admission medications   Medication Sig Start Date End Date Taking? Authorizing Provider  buPROPion (WELLBUTRIN XL) 150 MG 24 hr tablet Take 150 mg by mouth daily.     Yes Historical Provider, MD  busPIRone (BUSPAR) 15 MG tablet Take 15 mg by mouth 2 (two) times daily.   Yes Historical Provider, MD  cyanocobalamin (,  VITAMIN B-12,) 1000 MCG/ML injection Inject 1,000 mcg into the muscle every 30 (thirty) days.   Yes Historical Provider, MD  diazepam (VALIUM) 5 MG tablet Take by mouth. Take 1/2 tablet every 6 hours as needed   Yes Historical Provider, MD  levothyroxine (SYNTHROID, LEVOTHROID) 175 MCG tablet Take 175 mcg by mouth daily.     Yes Historical Provider, MD  omeprazole (PRILOSEC) 20 MG capsule Take 20 mg by mouth daily as needed.  01/31/14  Yes Historical Provider, MD  propafenone (RYTHMOL) 225 MG tablet Take 1 tablet (225 mg total) by mouth 2 (two) times daily. 08/30/14  Yes Ok Anishristopher R Berge, NP    Review of Systems  As above, she has been doing well. She has paroxysms of A. fib once a month or less lasting only a minute or 2. She says this is pretty normal for her and she doesn't think too much of it.   she has not been having chest pain, dyspnea, PND, orthopnea, dizziness, syncope, edema, or early satiety.  All other systems reviewed and are otherwise negative except as noted above.  Physical Exam  VS:  BP 134/88 mmHg  Pulse 71  Ht 5\' 5"  (1.651 m)  Wt 203 lb (92.08 kg)  BMI 33.78 kg/m2 , BMI Body mass index is 33.78 kg/(m^2). GEN: Well nourished, well developed, in no acute distress. HEENT: normal. Neck: Supple, no JVD, carotid bruits, or masses. Cardiac: RRR, no murmurs, rubs, or gallops. No clubbing, cyanosis, edema.  Radials/DP/PT 2+ and equal bilaterally.  Respiratory:  Respirations regular and unlabored, clear to auscultation bilaterally. GI: Soft, nontender, nondistended, BS + x 4. MS: no deformity or atrophy. Skin: warm and dry, no rash. Neuro:  Strength and sensation are intact. Psych: Normal affect.  Accessory Clinical Findings  ECG -regular sinus rhythm, 71, left axis, left atrial enlargement, no acute ST or T changes. Normal PR and QRS intervals at 150 and 100 ms respectively.  Assessment & Plan  1.  Paroxysmal atrial fibrillation: Patient has been doing well over the past 2 years. She does have brief episodes of palpitations occurring once a month or less but only last a minute or 2. She is comfortable with this amount of A. fib and is not currently interested in referral to the A. fib clinic. She is tolerating propafenone well and her ECG is stable today. We will arrange for follow-up with Dr. Graciela Hus[place]Klein in our [place] office in one year and she will contact us if she needs to be seen sooner.  2. Tobacco abuse: She continues to smoke about a half a pack per day. Complete cessation advised.  3. Disposition: Follow-up in one year or sooner if necessary.  Nicolasa Duckinghristopher Berge, NP 08/30/2014, 11:46 AM

## 2014-10-20 ENCOUNTER — Ambulatory Visit: Payer: Self-pay | Admitting: Family Medicine

## 2014-10-21 ENCOUNTER — Encounter: Payer: Self-pay | Admitting: Family Medicine

## 2014-10-21 ENCOUNTER — Ambulatory Visit (INDEPENDENT_AMBULATORY_CARE_PROVIDER_SITE_OTHER): Payer: 59 | Admitting: Family Medicine

## 2014-10-21 VITALS — BP 139/79 | HR 78 | Temp 98.2°F | Resp 18 | Ht 64.5 in | Wt 203.0 lb

## 2014-10-21 DIAGNOSIS — M9903 Segmental and somatic dysfunction of lumbar region: Secondary | ICD-10-CM | POA: Diagnosis not present

## 2014-10-21 DIAGNOSIS — M9904 Segmental and somatic dysfunction of sacral region: Secondary | ICD-10-CM | POA: Diagnosis not present

## 2014-10-21 DIAGNOSIS — M542 Cervicalgia: Secondary | ICD-10-CM

## 2014-10-21 DIAGNOSIS — M9908 Segmental and somatic dysfunction of rib cage: Secondary | ICD-10-CM | POA: Diagnosis not present

## 2014-10-21 DIAGNOSIS — G8929 Other chronic pain: Secondary | ICD-10-CM | POA: Diagnosis not present

## 2014-10-21 DIAGNOSIS — M9902 Segmental and somatic dysfunction of thoracic region: Secondary | ICD-10-CM | POA: Diagnosis not present

## 2014-10-21 DIAGNOSIS — M9907 Segmental and somatic dysfunction of upper extremity: Secondary | ICD-10-CM | POA: Diagnosis not present

## 2014-10-21 DIAGNOSIS — M9901 Segmental and somatic dysfunction of cervical region: Secondary | ICD-10-CM

## 2014-10-21 DIAGNOSIS — M9905 Segmental and somatic dysfunction of pelvic region: Secondary | ICD-10-CM

## 2014-10-21 DIAGNOSIS — M9900 Segmental and somatic dysfunction of head region: Secondary | ICD-10-CM

## 2014-10-21 DIAGNOSIS — M9909 Segmental and somatic dysfunction of abdomen and other regions: Secondary | ICD-10-CM | POA: Diagnosis not present
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
## 2014-10-21 NOTE — Patient Instructions (Signed)
Cervical Strain and Sprain (Whiplash) with Rehab Cervical strain and sprain are injuries that commonly occur with "whiplash" injuries. Whiplash occurs when the neck is forcefully whipped backward or forward, such as during a motor vehicle accident or during contact sports. The muscles, ligaments, tendons, discs, and nerves of the neck are susceptible to injury when this occurs. RISK FACTORS Risk of having a whiplash injury increases if:  Osteoarthritis of the spine.  Situations that make head or neck accidents or trauma more likely.  High-risk sports (football, rugby, wrestling, hockey, auto racing, gymnastics, diving, contact karate, or boxing).  Poor strength and flexibility of the neck.  Previous neck injury.  Poor tackling technique.  Improperly fitted or padded equipment. SYMPTOMS   Pain or stiffness in the front or back of neck or both.  Symptoms may present immediately or up to 24 hours after injury.  Dizziness, headache, nausea, and vomiting.  Muscle spasm with soreness and stiffness in the neck.  Tenderness and swelling at the injury site. PREVENTION  Learn and use proper technique (avoid tackling with the head, spearing, and head-butting; use proper falling techniques to avoid landing on the head).  Warm up and stretch properly before activity.  Maintain physical fitness:  Strength, flexibility, and endurance.  Cardiovascular fitness.  Wear properly fitted and padded protective equipment, such as padded soft collars, for participation in contact sports. PROGNOSIS  Recovery from cervical strain and sprain injuries is dependent on the extent of the injury. These injuries are usually curable in 1 week to 3 months with appropriate treatment.  RELATED COMPLICATIONS   Temporary numbness and weakness may occur if the nerve roots are damaged, and this may persist until the nerve has completely healed.  Chronic pain due to frequent recurrence of  symptoms.  Prolonged healing, especially if activity is resumed too soon (before complete recovery). TREATMENT  Treatment initially involves the use of ice and medication to help reduce pain and inflammation. It is also important to perform strengthening and stretching exercises and modify activities that worsen symptoms so the injury does not get worse. These exercises may be performed at home or with a therapist. For patients who experience severe symptoms, a soft, padded collar may be recommended to be worn around the neck.  Improving your posture may help reduce symptoms. Posture improvement includes pulling your chin and abdomen in while sitting or standing. If you are sitting, sit in a firm chair with your buttocks against the back of the chair. While sleeping, try replacing your pillow with a small towel rolled to 2 inches in diameter, or use a cervical pillow or soft cervical collar. Poor sleeping positions delay healing.  For patients with nerve root damage, which causes numbness or weakness, the use of a cervical traction apparatus may be recommended. Surgery is rarely necessary for these injuries. However, cervical strain and sprains that are present at birth (congenital) may require surgery. MEDICATION   If pain medication is necessary, nonsteroidal anti-inflammatory medications, such as aspirin and ibuprofen, or other minor pain relievers, such as acetaminophen, are often recommended.  Do not take pain medication for 7 days before surgery.  Prescription pain relievers may be given if deemed necessary by your caregiver. Use only as directed and only as much as you need. HEAT AND COLD:   Cold treatment (icing) relieves pain and reduces inflammation. Cold treatment should be applied for 10 to 15 minutes every 2 to 3 hours for inflammation and pain and immediately after any activity that aggravates   your symptoms. Use ice packs or an ice massage.  Heat treatment may be used prior to  performing the stretching and strengthening activities prescribed by your caregiver, physical therapist, or athletic trainer. Use a heat pack or a warm soak. SEEK MEDICAL CARE IF:   Symptoms get worse or do not improve in 2 weeks despite treatment.  New, unexplained symptoms develop (drugs used in treatment may produce side effects). EXERCISES RANGE OF MOTION (ROM) AND STRETCHING EXERCISES - Cervical Strain and Sprain These exercises may help you when beginning to rehabilitate your injury. In order to successfully resolve your symptoms, you must improve your posture. These exercises are designed to help reduce the forward-head and rounded-shoulder posture which contributes to this condition. Your symptoms may resolve with or without further involvement from your physician, physical therapist or athletic trainer. While completing these exercises, remember:   Restoring tissue flexibility helps normal motion to return to the joints. This allows healthier, less painful movement and activity.  An effective stretch should be held for at least 20 seconds, although you may need to begin with shorter hold times for comfort.  A stretch should never be painful. You should only feel a gentle lengthening or release in the stretched tissue. STRETCH- Axial Extensors  Lie on your back on the floor. You may bend your knees for comfort. Place a rolled-up hand towel or dish towel, about 2 inches in diameter, under the part of your head that makes contact with the floor.  Gently tuck your chin, as if trying to make a "double chin," until you feel a gentle stretch at the base of your head.  Hold __________ seconds. Repeat __________ times. Complete this exercise __________ times per day.  STRETCH - Axial Extension   Stand or sit on a firm surface. Assume a good posture: chest up, shoulders drawn back, abdominal muscles slightly tense, knees unlocked (if standing) and feet hip width apart.  Slowly retract your  chin so your head slides back and your chin slightly lowers. Continue to look straight ahead.  You should feel a gentle stretch in the back of your head. Be certain not to feel an aggressive stretch since this can cause headaches later.  Hold for __________ seconds. Repeat __________ times. Complete this exercise __________ times per day. STRETCH - Cervical Side Bend   Stand or sit on a firm surface. Assume a good posture: chest up, shoulders drawn back, abdominal muscles slightly tense, knees unlocked (if standing) and feet hip width apart.  Without letting your nose or shoulders move, slowly tip your right / left ear to your shoulder until your feel a gentle stretch in the muscles on the opposite side of your neck.  Hold __________ seconds. Repeat __________ times. Complete this exercise __________ times per day. STRETCH - Cervical Rotators   Stand or sit on a firm surface. Assume a good posture: chest up, shoulders drawn back, abdominal muscles slightly tense, knees unlocked (if standing) and feet hip width apart.  Keeping your eyes level with the ground, slowly turn your head until you feel a gentle stretch along the back and opposite side of your neck.  Hold __________ seconds. Repeat __________ times. Complete this exercise __________ times per day. RANGE OF MOTION - Neck Circles   Stand or sit on a firm surface. Assume a good posture: chest up, shoulders drawn back, abdominal muscles slightly tense, knees unlocked (if standing) and feet hip width apart.  Gently roll your head down and around from the   back of one shoulder to the back of the other. The motion should never be forced or painful.  Repeat the motion 10-20 times, or until you feel the neck muscles relax and loosen. Repeat __________ times. Complete the exercise __________ times per day. STRENGTHENING EXERCISES - Cervical Strain and Sprain These exercises may help you when beginning to rehabilitate your injury. They may  resolve your symptoms with or without further involvement from your physician, physical therapist, or athletic trainer. While completing these exercises, remember:   Muscles can gain both the endurance and the strength needed for everyday activities through controlled exercises.  Complete these exercises as instructed by your physician, physical therapist, or athletic trainer. Progress the resistance and repetitions only as guided.  You may experience muscle soreness or fatigue, but the pain or discomfort you are trying to eliminate should never worsen during these exercises. If this pain does worsen, stop and make certain you are following the directions exactly. If the pain is still present after adjustments, discontinue the exercise until you can discuss the trouble with your clinician. STRENGTH - Cervical Flexors, Isometric  Face a wall, standing about 6 inches away. Place a small pillow, a ball about 6-8 inches in diameter, or a folded towel between your forehead and the wall.  Slightly tuck your chin and gently push your forehead into the soft object. Push only with mild to moderate intensity, building up tension gradually. Keep your jaw and forehead relaxed.  Hold 10 to 20 seconds. Keep your breathing relaxed.  Release the tension slowly. Relax your neck muscles completely before you start the next repetition. Repeat __________ times. Complete this exercise __________ times per day. STRENGTH- Cervical Lateral Flexors, Isometric   Stand about 6 inches away from a wall. Place a small pillow, a ball about 6-8 inches in diameter, or a folded towel between the side of your head and the wall.  Slightly tuck your chin and gently tilt your head into the soft object. Push only with mild to moderate intensity, building up tension gradually. Keep your jaw and forehead relaxed.  Hold 10 to 20 seconds. Keep your breathing relaxed.  Release the tension slowly. Relax your neck muscles completely  before you start the next repetition. Repeat __________ times. Complete this exercise __________ times per day. STRENGTH - Cervical Extensors, Isometric   Stand about 6 inches away from a wall. Place a small pillow, a ball about 6-8 inches in diameter, or a folded towel between the back of your head and the wall.  Slightly tuck your chin and gently tilt your head back into the soft object. Push only with mild to moderate intensity, building up tension gradually. Keep your jaw and forehead relaxed.  Hold 10 to 20 seconds. Keep your breathing relaxed.  Release the tension slowly. Relax your neck muscles completely before you start the next repetition. Repeat __________ times. Complete this exercise __________ times per day. POSTURE AND BODY MECHANICS CONSIDERATIONS - Cervical Strain and Sprain Keeping correct posture when sitting, standing or completing your activities will reduce the stress put on different body tissues, allowing injured tissues a chance to heal and limiting painful experiences. The following are general guidelines for improved posture. Your physician or physical therapist will provide you with any instructions specific to your needs. While reading these guidelines, remember:  The exercises prescribed by your provider will help you have the flexibility and strength to maintain correct postures.  The correct posture provides the optimal environment for your joints to   work. All of your joints have less wear and tear when properly supported by a spine with good posture. This means you will experience a healthier, less painful body.  Correct posture must be practiced with all of your activities, especially prolonged sitting and standing. Correct posture is as important when doing repetitive low-stress activities (typing) as it is when doing a single heavy-load activity (lifting). PROLONGED STANDING WHILE SLIGHTLY LEANING FORWARD When completing a task that requires you to lean  forward while standing in one place for a long time, place either foot up on a stationary 2- to 4-inch high object to help maintain the best posture. When both feet are on the ground, the low back tends to lose its slight inward curve. If this curve flattens (or becomes too large), then the back and your other joints will experience too much stress, fatigue more quickly, and can cause pain.  RESTING POSITIONS Consider which positions are most painful for you when choosing a resting position. If you have pain with flexion-based activities (sitting, bending, stooping, squatting), choose a position that allows you to rest in a less flexed posture. You would want to avoid curling into a fetal position on your side. If your pain worsens with extension-based activities (prolonged standing, working overhead), avoid resting in an extended position such as sleeping on your stomach. Most people will find more comfort when they rest with their spine in a more neutral position, neither too rounded nor too arched. Lying on a non-sagging bed on your side with a pillow between your knees, or on your back with a pillow under your knees will often provide some relief. Keep in mind, being in any one position for a prolonged period of time, no matter how correct your posture, can still lead to stiffness. WALKING Walk with an upright posture. Your ears, shoulders, and hips should all line up. OFFICE WORK When working at a desk, create an environment that supports good, upright posture. Without extra support, muscles fatigue and lead to excessive strain on joints and other tissues. CHAIR:  A chair should be able to slide under your desk when your back makes contact with the back of the chair. This allows you to work closely.  The chair's height should allow your eyes to be level with the upper part of your monitor and your hands to be slightly lower than your elbows.  Body position:  Your feet should make contact with the  floor. If this is not possible, use a foot rest.  Keep your ears over your shoulders. This will reduce stress on your neck and low back. Document Released: 04/23/2005 Document Revised: 09/07/2013 Document Reviewed: 08/05/2008 [employer] Patient Information 2015 [employer]. This information is not intended to replace advice given to you by your health care provider. Make sure you discuss any questions you have with your health care provider.  
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
## 2014-11-04 ENCOUNTER — Ambulatory Visit: Payer: 59 | Admitting: Family Medicine

## 2014-11-09 ENCOUNTER — Other Ambulatory Visit: Payer: Self-pay | Admitting: Family Medicine

## 2014-11-09 NOTE — Telephone Encounter (Signed)
Needs labs and an appt for thyroid and cholesterol please Last thyroid lab was March 2015 Last cholesterol was Sept 2015 I'll send in 30 day supply but she'll need visit with me and labs for next Rx Please send the valium Rx to Dr. Laural BenesJohnson; I have not been treating her for the neck issues and won't refill the valium

## 2014-11-10 NOTE — Telephone Encounter (Signed)
I spoke with patient and she said she has an appt with Dr. Laural BenesJohnson tomorrow and will ask her if she can order the labs then she asked to talk to Almira CoasterGina so I transfer the call.

## 2014-11-11 ENCOUNTER — Ambulatory Visit (INDEPENDENT_AMBULATORY_CARE_PROVIDER_SITE_OTHER): Payer: 59 | Admitting: Family Medicine

## 2014-11-11 ENCOUNTER — Encounter: Payer: Self-pay | Admitting: Family Medicine

## 2014-11-11 VITALS — BP 123/85 | HR 67 | Temp 98.2°F | Wt 198.9 lb

## 2014-11-11 DIAGNOSIS — M6248 Contracture of muscle, other site: Secondary | ICD-10-CM | POA: Diagnosis not present

## 2014-11-11 DIAGNOSIS — M9904 Segmental and somatic dysfunction of sacral region: Secondary | ICD-10-CM

## 2014-11-11 DIAGNOSIS — M9901 Segmental and somatic dysfunction of cervical region: Secondary | ICD-10-CM | POA: Diagnosis not present

## 2014-11-11 DIAGNOSIS — M9909 Segmental and somatic dysfunction of abdomen and other regions: Secondary | ICD-10-CM

## 2014-11-11 DIAGNOSIS — M9903 Segmental and somatic dysfunction of lumbar region: Secondary | ICD-10-CM

## 2014-11-11 DIAGNOSIS — M9902 Segmental and somatic dysfunction of thoracic region: Secondary | ICD-10-CM

## 2014-11-11 DIAGNOSIS — M9905 Segmental and somatic dysfunction of pelvic region: Secondary | ICD-10-CM | POA: Diagnosis not present

## 2014-11-11 DIAGNOSIS — M62838 Other muscle spasm: Secondary | ICD-10-CM | POA: Insufficient documentation

## 2014-11-11 DIAGNOSIS — E038 Other specified hypothyroidism: Secondary | ICD-10-CM

## 2014-11-11 DIAGNOSIS — G8929 Other chronic pain: Secondary | ICD-10-CM

## 2014-11-11 DIAGNOSIS — M542 Cervicalgia: Secondary | ICD-10-CM

## 2014-11-11 MED ORDER — DIAZEPAM 5 MG PO TABS: 2.5000 mg | ORAL_TABLET | Freq: Four times a day (QID) | ORAL | Status: DC | PRN

## 2014-11-11 NOTE — Assessment & Plan Note (Signed)
Does well with diazepam. Had 10 pills in March and is requesting a refill today. Controlled substance agreement signed today. 10 pills given today. Monitor closely.
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
## 2014-11-12 LAB — THYROID PANEL WITH TSH
FREE THYROXINE INDEX: 3.7 (ref 1.2–4.9)
T3 UPTAKE RATIO: 29 % (ref 24–39)
T4, Total: 12.8 ug/dL — ABNORMAL HIGH (ref 4.5–12.0)
TSH: 0.489 u[IU]/mL (ref 0.450–4.500)

## 2014-11-13 ENCOUNTER — Telehealth: Payer: Self-pay | Admitting: Family Medicine

## 2014-11-13 DIAGNOSIS — E038 Other specified hypothyroidism: Secondary | ICD-10-CM

## 2014-11-13 MED ORDER — LEVOTHYROXINE SODIUM 150 MCG PO TABS: 150.0000 ug | ORAL_TABLET | Freq: Every day | ORAL | Status: DC

## 2014-11-13 NOTE — Telephone Encounter (Signed)
Please let patient know it looks like she is getting a little too much thyroid medicine I'd like to adjust it a little and recheck labs in 8 weeks Let us know of any problems in the meantime

## 2014-11-15 ENCOUNTER — Telehealth: Payer: Self-pay | Admitting: Family Medicine

## 2014-11-15 NOTE — Telephone Encounter (Signed)
Left detailed message for patient and to call back if she has an other questions.

## 2014-11-15 NOTE — Telephone Encounter (Signed)
Duplicate message, I already had a message started where I was attempting to call her.

## 2014-11-15 NOTE — Telephone Encounter (Signed)
Left message to call.

## 2014-11-15 NOTE — Telephone Encounter (Signed)
PT CALLED DID'T SAY WHAT SHE WANTED JUST ASKED TO SPEAK WITH AMY SORRY COULDN'T GET MORE INFO.

## 2014-12-02 ENCOUNTER — Ambulatory Visit: Payer: 59 | Admitting: Family Medicine

## 2014-12-07 ENCOUNTER — Encounter: Payer: Self-pay | Admitting: Family Medicine

## 2014-12-07 ENCOUNTER — Ambulatory Visit (INDEPENDENT_AMBULATORY_CARE_PROVIDER_SITE_OTHER): Payer: 59 | Admitting: Family Medicine

## 2014-12-07 VITALS — BP 130/84 | HR 72 | Temp 98.4°F | Ht 65.0 in | Wt 198.0 lb

## 2014-12-07 DIAGNOSIS — M9906 Segmental and somatic dysfunction of lower extremity: Secondary | ICD-10-CM | POA: Diagnosis not present

## 2014-12-07 DIAGNOSIS — M9903 Segmental and somatic dysfunction of lumbar region: Secondary | ICD-10-CM

## 2014-12-07 DIAGNOSIS — M9901 Segmental and somatic dysfunction of cervical region: Secondary | ICD-10-CM

## 2014-12-07 DIAGNOSIS — M9904 Segmental and somatic dysfunction of sacral region: Secondary | ICD-10-CM | POA: Diagnosis not present

## 2014-12-07 DIAGNOSIS — M545 Low back pain, unspecified: Secondary | ICD-10-CM

## 2014-12-07 DIAGNOSIS — M9905 Segmental and somatic dysfunction of pelvic region: Secondary | ICD-10-CM | POA: Diagnosis not present

## 2014-12-07 DIAGNOSIS — M9909 Segmental and somatic dysfunction of abdomen and other regions: Secondary | ICD-10-CM | POA: Diagnosis not present

## 2014-12-07 DIAGNOSIS — M9902 Segmental and somatic dysfunction of thoracic region: Secondary | ICD-10-CM

## 2014-12-07 DIAGNOSIS — M542 Cervicalgia: Secondary | ICD-10-CM

## 2014-12-07 DIAGNOSIS — G8929 Other chronic pain: Secondary | ICD-10-CM | POA: Diagnosis not present

## 2014-12-07 DIAGNOSIS — M9900 Segmental and somatic dysfunction of head region: Secondary | ICD-10-CM
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
## 2014-12-28 ENCOUNTER — Ambulatory Visit (INDEPENDENT_AMBULATORY_CARE_PROVIDER_SITE_OTHER): Payer: 59 | Admitting: Family Medicine

## 2014-12-28 ENCOUNTER — Encounter: Payer: Self-pay | Admitting: Family Medicine

## 2014-12-28 VITALS — BP 149/92 | HR 83 | Temp 99.2°F | Wt 197.4 lb

## 2014-12-28 DIAGNOSIS — M9900 Segmental and somatic dysfunction of head region: Secondary | ICD-10-CM

## 2014-12-28 DIAGNOSIS — M542 Cervicalgia: Secondary | ICD-10-CM

## 2014-12-28 DIAGNOSIS — M9909 Segmental and somatic dysfunction of abdomen and other regions: Secondary | ICD-10-CM

## 2014-12-28 DIAGNOSIS — M9902 Segmental and somatic dysfunction of thoracic region: Secondary | ICD-10-CM | POA: Diagnosis not present

## 2014-12-28 DIAGNOSIS — G8929 Other chronic pain: Secondary | ICD-10-CM | POA: Diagnosis not present

## 2014-12-28 DIAGNOSIS — M9908 Segmental and somatic dysfunction of rib cage: Secondary | ICD-10-CM

## 2014-12-28 DIAGNOSIS — M9901 Segmental and somatic dysfunction of cervical region: Secondary | ICD-10-CM
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
## 2014-12-28 NOTE — Patient Instructions (Addendum)
EXERCISES  RANGE OF MOTION (ROM) AND STRETCHING EXERCISES  These exercises may help you when beginning to rehabilitate your injury. Your symptoms may resolve with or without further involvement from your physician, physical therapist or athletic trainer. While completing these exercises, remember:   Restoring tissue flexibility helps normal motion to return to the joints. This allows healthier, less painful movement and activity.  An effective stretch should be held for at least 30 seconds.  A stretch should never be painful. You should only feel a gentle lengthening or release in the stretched tissue. ROM - Pendulum  Bend at the waist so that your right / left arm falls away from your body. Support yourself with your opposite hand on a solid surface, such as a table or a countertop.  Your right / left arm should be perpendicular to the ground. If it is not perpendicular, you need to lean over farther. Relax the muscles in your right / left arm and shoulder as much as possible.  Gently sway your hips and trunk so they move your right / left arm without any use of your right / left shoulder muscles.  Progress your movements so that your right / left arm moves side to side, then forward and backward, and finally, both clockwise and counterclockwise.  Complete __________ repetitions in each direction. Many people use this exercise to relieve discomfort in their shoulder as well as to gain range of motion. Repeat __________ times. Complete this exercise __________ times per day. STRETCH - Flexion, Seated   Sit in a firm chair so that your right / left forearm can rest on a table or on a table or countertop. Your right / left elbow should rest below the height of your shoulder so that your shoulder feels supported and not tense or uncomfortable.  Keeping your right / left shoulder relaxed, lean forward at your waist, allowing your right / left hand to slide forward. Bend forward until you feel a  moderate stretch in your shoulder, but before you feel an increase in your pain.  Hold __________ seconds. Slowly return to your starting position. Repeat __________ times. Complete this exercise __________ times per day.  STRETCH - Flexion, Standing  Stand with good posture. With an underhand grip on your right / left and an overhand grip on the opposite hand, grasp a broomstick or cane so that your hands are a little more than shoulder-width apart.  Keeping your right / left elbow straight and shoulder muscles relaxed, push the stick with your opposite hand to raise your right / left arm in front of your body and then overhead. Raise your arm until you feel a stretch in your right / left shoulder, but before you have increased shoulder pain.  Avoid shrugging your right / left shoulder as your arm rises by keeping your shoulder blade tucked down and toward your mid-back spine. Hold __________ seconds.  Slowly return to the starting position. Repeat __________ times. Complete this exercise __________ times per day. STRETCH - Abduction, Supine  Stand with good posture. With an underhand grip on your right / left and an overhand grip on the opposite hand, grasp a broomstick or cane so that your hands are a little more than shoulder-width apart.  Keeping your right / left elbow straight and shoulder muscles relaxed, push the stick with your opposite hand to raise your right / left arm out to the side of your body and then overhead. Raise your arm until you feel a  stretch in your right / left shoulder, but before you have increased shoulder pain.  Avoid shrugging your right / left shoulder as your arm rises by keeping your shoulder blade tucked down and toward your mid-back spine. Hold __________ seconds.  Slowly return to the starting position. Repeat __________ times. Complete this exercise __________ times per day. ROM - Flexion, Active-Assisted  Lie on your back. You may bend your knees for  comfort.  Grasp a broomstick or cane so your hands are about shoulder-width apart. Your right / left hand should grip the end of the stick/cane so that your hand is positioned "thumbs-up," as if you were about to shake hands.  Using your healthy arm to lead, raise your right / left arm overhead until you feel a gentle stretch in your shoulder. Hold __________ seconds.  Use the stick/cane to assist in returning your right / left arm to its starting position. Repeat __________ times. Complete this exercise __________ times per day.  STRENGTHENING EXERCISES - Scapular Winging (Serratus Anterior Palsy, Long Thoracic Nerve Injury) These exercises may help you when beginning to rehabilitate your injury. They may resolve your symptoms with or without further involvement from your physician, physical therapist or athletic trainer. While completing these exercises, remember:   Muscles can gain both the endurance and the strength needed for everyday activities through controlled exercises.  Complete these exercises as instructed by your physician, physical therapist or athletic trainer. Progress with the resistance and repetition exercises only as your caregiver advises.  You may experience muscle soreness or fatigue, but the pain or discomfort you are trying to eliminate should never worsen during these exercises. If this pain does worsen, stop and make certain you are following the directions exactly. If the pain is still present after adjustments, discontinue the exercise until you can discuss the trouble with your clinician.  During your recovery, avoid activity or exercises which involve actions that place your injured hand or elbow above your head or behind your back or head. These positions stress the tissues which are trying to heal. STRENGTH - Scapular Depression and Adduction   With good posture, sit on a firm chair. Supported your arms in front of you with pillows, arm rests or a table top. Have  your elbows in line with the sides of your body.  Gently draw your shoulder blades down and toward your mid-back spine. Gradually increase the tension without tensing the muscles along the top of your shoulders and the back of your neck.  Hold for __________ seconds. Slowly release the tension and relax your muscles completely before completing the next repetition.  After you have practiced this exercise, remove the arm support and complete it in standing as well as sitting. Repeat __________ times. Complete this exercise __________ times per day.  STRENGTH - Scapular Protractors, Standing   Stand arms-length away from a wall. Place your hands on the wall, keeping your elbows straight.  Begin by dropping your shoulder blades down and toward your mid-back spine.  To strengthen your protractors, keep your shoulder blades down, but slide them forward on your rib cage. It will feel as if you are lifting the back of your rib cage away from the wall. This is a subtle motion and can be challenging to complete. Ask your clinician for further instruction if you are not sure you are doing the exercise correctly.  Hold for __________ seconds. Slowly return to the starting position, resting the muscles completely before completing the next repetition.  Repeat __________ times. Complete this exercise __________ times per day. STRENGTH - Scapular Protractors, Supine  Lie on your back on a firm surface. Extend your right / left arm straight into the air while holding a __________ weight in your hand.  Keeping your head and back in place, lift your shoulder off the floor.  Hold __________ seconds. Slowly return to the starting position and allow your muscles to relax completely before completing the next repetition. Repeat __________ times. Complete this exercise __________ times per day. STRENGTH - Scapular Protractors, Quadruped  Get onto your hands and knees with your shoulders directly over your hands  (or as close as you comfortably can be).  Keeping your elbows locked, lift the back of your rib cage up into your shoulder blades so your mid-back rounds-out. Keep your neck muscles relaxed.  Hold this position for __________ seconds. Slowly return to the starting position and allow your muscles to relax completely before completing the next repetition. Repeat __________ times. Complete this exercise __________ times per day.  STRENGTH - Scapular Depressors  Keeping your feet on the floor, lift your bottom from the seat and lock your elbows.  Keeping your elbows straight, allow gravity to pull your body weight down. Your shoulders will rise toward your ears.  Raise your body against gravity by drawing your shoulder blades down your back, shortening the distance between your shoulders and ears. Although your feet should always maintain contact with the floor, your feet should progressively support less body weight as you get stronger.  Hold __________ seconds. In a controlled and slow manner, lower your body weight to begin the next repetition. Repeat __________ times. Complete this exercise __________ times per day.  STRENGTH - Shoulder Extensors, Prone  Lie on your stomach on a firm surface so that your right / left arm overhangs the edge. Rest your forehead on your opposite forearm. With your thumb facing away from your body and your elbow straight, hold a __________ weight in your hand.  Squeeze your right / left shoulder blade to your mid-back spine and then slowly raise your arm behind you to the height of the bed.  Hold for __________ seconds. Slowly reverse the directions and return to the starting position, controlling the weight as you lower your arm. Repeat __________ times. Complete this exercise __________ times per day.  STRENGTH - Horizontal Abductors Choose one of the two oppositions to complete this exercise. Prone: lying on stomach:  Lie on your stomach on a firm surface  so that your right / left arm overhangs the edge. Rest your forehead on your opposite forearm. With your palm facing the floor and your elbow straight, hold a __________ weight in your hand.  Squeeze your right / left shoulder blade to your mid-back spine and then slowly raise your arm to the height of the bed.  Hold for __________ seconds. Slowly reverse the directions and return to the starting position, controlling the weight as you lower your arm. Repeat __________ times. Complete this exercise __________ times per day. Standing:  Secure a rubber exercise band/tubing so that it is at the height of your shoulders when you are either standing or sitting on a firm arm-less chair.  Grasp an end of the band/tubing in each hand and have your palms face each other. Straighten your elbows and lift your hands straight in front of you at shoulder height. Step back away from the secured end of band/tubing until it becomes tense.  Squeeze your shoulder  blades together. Keeping your elbows locked and your hands at shoulder-height, bring your hands out to your side.  Hold __________ seconds. Slowly ease the tension on the band/tubing as you reverse the directions and return to the starting position. Repeat __________ times. Complete this exercise __________ times per day. STRENGTH - Scapular Retractors  Secure a rubber exercise band/tubing so that it is at the height of your shoulders when you are either standing or sitting on a firm arm-less chair.  With a palm-down grip, grasp an end of the band/tubing in each hand. Straighten your elbows and lift your hands straight in front of you at shoulder height. Step back away from the secured end of band/tubing until it becomes tense.  Squeezing your shoulder blades together, draw your elbows back as you bend them. Keep your upper arm lifted away from your body throughout the exercise.  Hold __________ seconds. Slowly ease the tension on the band/tubing as  you reverse the directions and return to the starting position. Repeat __________ times. Complete this exercise __________ times per day. STRENGTH - Shoulder Extensors   Secure a rubber exercise band/tubing so that it is at the height of your shoulders when you are either standing or sitting on a firm arm-less chair.  With a thumbs-up grip, grasp an end of the band/tubing in each hand. Straighten your elbows and lift your hands straight in front of you at shoulder height. Step back away from the secured end of band/tubing until it becomes tense.  Squeezing your shoulder blades together, pull your hands down to the sides of your thighs. Do not allow your hands to go behind you.  Hold for __________ seconds. Slowly ease the tension on the band/tubing as you reverse the directions and return to the starting position. Repeat __________ times. Complete this exercise __________ times per day.  STRENGTH - Scapular Retractors and External Rotators  Secure a rubber exercise band/tubing so that it is at the height of your shoulders when you are either standing or sitting on a firm arm-less chair.  With a palm-down grip, grasp an end of the band/tubing in each hand. Bend your elbows 90 degrees and lift your elbows to shoulder height at your sides. Step back away from the secured end of band/tubing until it becomes tense.  Squeezing your shoulder blades together, rotate your shoulder so that your upper arm and elbow remain stationary, but your fists travel upward to head-height.  Hold __________ for seconds. Slowly ease the tension on the band/tubing as you reverse the directions and return to the starting position. Repeat __________ times. Complete this exercise __________ times per day.  STRENGTH - Scapular Retractors and External Rotators, Rowing  Secure a rubber exercise band/tubing so that it is at the height of your shoulders when you are either standing or sitting on a firm arm-less chair.  With  a palm-down grip, grasp an end of the band/tubing in each hand. Straighten your elbows and lift your hands straight in front of you at shoulder height. Step back away from the secured end of band/tubing until it becomes tense.  Step 1: Squeeze your shoulder blades together. Bending your elbows, draw your hands to your chest as if you are rowing a boat. At the end of this motion, your hands and elbow should be at shoulder-height and your elbows should be out to your sides.  Step 2: Rotate your shoulder to raise your hands above your head. Your forearms should be vertical and your upper-arms  should be horizontal.  Hold for __________ seconds. Slowly ease the tension on the band/tubing as you reverse the directions and return to the starting position. Repeat __________ times. Complete this exercise __________ times per day.  STRENGTH - Scapular Retractors and Elevators  Secure a rubber exercise band/tubing so that it is at the height of your shoulders when you are either standing or sitting on a firm arm-less chair.  With a thumbs-up grip, grasp an end of the band/tubing in each hand. Step back away from the secured end of band/tubing until it becomes tense.  Squeezing your shoulder blades together, straighten your elbows and lift your hands straight over your head.  Hold for __________ seconds. Slowly ease the tension on the band/tubing as you reverse the directions and return to the starting position. Repeat __________ times. Complete this exercise __________ times per day.  Document Released: 04/23/2005 Document Revised: 07/16/2011 Document Reviewed: 08/05/2008 [facility] Patient Information 2015 [place], [place]. This information is not intended to replace advice given to you by your health care provider. Make sure you discuss any questions you have with your health care provider.

## 2014-12-29 ENCOUNTER — Ambulatory Visit: Payer: 59 | Admitting: Family Medicine

## 2015-01-05 ENCOUNTER — Other Ambulatory Visit: Payer: Self-pay | Admitting: Family Medicine

## 2015-01-05 NOTE — Telephone Encounter (Signed)
Routing to provider  

## 2015-01-20 ENCOUNTER — Ambulatory Visit: Payer: 59 | Admitting: Family Medicine

## 2015-01-26 ENCOUNTER — Ambulatory Visit (INDEPENDENT_AMBULATORY_CARE_PROVIDER_SITE_OTHER): Payer: 59 | Admitting: Family Medicine

## 2015-01-26 ENCOUNTER — Encounter: Payer: Self-pay | Admitting: Family Medicine

## 2015-01-26 VITALS — BP 141/95 | HR 71 | Temp 97.9°F | Wt 196.0 lb

## 2015-01-26 DIAGNOSIS — M9905 Segmental and somatic dysfunction of pelvic region: Secondary | ICD-10-CM

## 2015-01-26 DIAGNOSIS — M9908 Segmental and somatic dysfunction of rib cage: Secondary | ICD-10-CM

## 2015-01-26 DIAGNOSIS — M9903 Segmental and somatic dysfunction of lumbar region: Secondary | ICD-10-CM

## 2015-01-26 DIAGNOSIS — M9902 Segmental and somatic dysfunction of thoracic region: Secondary | ICD-10-CM

## 2015-01-26 DIAGNOSIS — M9904 Segmental and somatic dysfunction of sacral region: Secondary | ICD-10-CM

## 2015-01-26 DIAGNOSIS — M9901 Segmental and somatic dysfunction of cervical region: Secondary | ICD-10-CM | POA: Diagnosis not present

## 2015-01-26 DIAGNOSIS — M9909 Segmental and somatic dysfunction of abdomen and other regions: Secondary | ICD-10-CM

## 2015-01-26 DIAGNOSIS — M9900 Segmental and somatic dysfunction of head region: Secondary | ICD-10-CM

## 2015-01-26 DIAGNOSIS — M545 Low back pain, unspecified: Secondary | ICD-10-CM

## 2015-01-26 DIAGNOSIS — G8929 Other chronic pain: Secondary | ICD-10-CM | POA: Diagnosis not present

## 2015-01-26 DIAGNOSIS — M542 Cervicalgia: Secondary | ICD-10-CM | POA: Diagnosis not present

## 2015-01-26 MED ORDER — DIAZEPAM 5 MG PO TABS: 2.5000 mg | ORAL_TABLET | Freq: Four times a day (QID) | ORAL | Status: DC | PRN
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
## 2015-01-31 ENCOUNTER — Other Ambulatory Visit: Payer: 59

## 2015-01-31 DIAGNOSIS — E038 Other specified hypothyroidism: Secondary | ICD-10-CM

## 2015-02-01 ENCOUNTER — Emergency Department
Admission: EM | Admit: 2015-02-01 | Discharge: 2015-02-01 | Disposition: A | Discharge status: Home / Self Care | Payer: 59 | Attending: Emergency Medicine | Admitting: Emergency Medicine

## 2015-02-01 ENCOUNTER — Emergency Department: Payer: 59

## 2015-02-01 ENCOUNTER — Encounter: Payer: Self-pay | Admitting: Family Medicine

## 2015-02-01 DIAGNOSIS — Z720 Tobacco use: Secondary | ICD-10-CM | POA: Insufficient documentation

## 2015-02-01 DIAGNOSIS — Z79899 Other long term (current) drug therapy: Secondary | ICD-10-CM | POA: Diagnosis not present

## 2015-02-01 DIAGNOSIS — Z880 Allergy status to penicillin: Secondary | ICD-10-CM | POA: Diagnosis not present

## 2015-02-01 DIAGNOSIS — F410 Panic disorder [episodic paroxysmal anxiety] without agoraphobia: Secondary | ICD-10-CM | POA: Diagnosis not present

## 2015-02-01 DIAGNOSIS — F329 Major depressive disorder, single episode, unspecified: Secondary | ICD-10-CM | POA: Diagnosis not present

## 2015-02-01 DIAGNOSIS — R002 Palpitations: Secondary | ICD-10-CM | POA: Insufficient documentation

## 2015-02-01 DIAGNOSIS — E038 Other specified hypothyroidism: Secondary | ICD-10-CM

## 2015-02-01 DIAGNOSIS — J441 Chronic obstructive pulmonary disease with (acute) exacerbation: Secondary | ICD-10-CM | POA: Insufficient documentation

## 2015-02-01 LAB — BASIC METABOLIC PANEL
ANION GAP: 8 (ref 5–15)
BUN: 13 mg/dL (ref 6–20)
CALCIUM: 9.1 mg/dL (ref 8.9–10.3)
CO2: 27 mmol/L (ref 22–32)
Chloride: 106 mmol/L (ref 101–111)
Creatinine, Ser: 1.03 mg/dL — ABNORMAL HIGH (ref 0.44–1.00)
GFR, EST NON AFRICAN AMERICAN: 58 mL/min — AB (ref >60)
Glucose, Bld: 128 mg/dL — ABNORMAL HIGH (ref 65–99)
POTASSIUM: 3.5 mmol/L (ref 3.5–5.1)
SODIUM: 141 mmol/L (ref 135–145)

## 2015-02-01 LAB — CBC
HEMATOCRIT: 42.2 % (ref 35.0–47.0)
HEMOGLOBIN: 13.8 g/dL (ref 12.0–16.0)
MCH: 29.6 pg (ref 26.0–34.0)
MCHC: 32.6 g/dL (ref 32.0–36.0)
MCV: 90.7 fL (ref 80.0–100.0)
Platelets: 220 10*3/uL (ref 150–440)
RBC: 4.65 MIL/uL (ref 3.80–5.20)
RDW: 12.9 % (ref 11.5–14.5)
WBC: 9.6 10*3/uL (ref 3.6–11.0)

## 2015-02-01 LAB — TROPONIN I: TROPONIN I: 0.03 ng/mL (ref <0.031)

## 2015-02-01 LAB — TSH: TSH: 1.02 u[IU]/mL (ref 0.450–4.500)

## 2015-02-01 LAB — T4, FREE: Free T4: 1.7 ng/dL (ref 0.82–1.77)

## 2015-02-01 MED ORDER — LEVOTHYROXINE SODIUM 150 MCG PO TABS: 150.0000 ug | ORAL_TABLET | Freq: Every day | ORAL | Status: DC

## 2015-02-01 NOTE — ED Notes (Signed)
Pt's blood drawn by venipuncture to left hand for repeat troponin. Pt tolerated well. Specimen sent to lab. Will continue to monitor.

## 2015-02-01 NOTE — ED Notes (Signed)
Pt given written and verbal discharge instructions. Verbalized understanding.

## 2015-02-01 NOTE — Discharge Instructions (Signed)
Please seek medical attention for any high fevers, chest pain, shortness of breath, change in behavior, persistent vomiting, bloody stool or any other new or concerning symptoms. ° ° °Atrial Fibrillation °Atrial fibrillation is a type of irregular heart rhythm (arrhythmia). During atrial fibrillation, the upper chambers of the heart (atria) quiver continuously in a chaotic pattern. This causes an irregular and often rapid heart rate.  °Atrial fibrillation is the result of the heart becoming overloaded with disorganized signals that tell it to beat. These signals are normally released one at a time by a part of the right atrium called the sinoatrial node. They then travel from the atria to the lower chambers of the heart (ventricles), causing the atria and ventricles to contract and pump blood as they pass. In atrial fibrillation, parts of the atria outside of the sinoatrial node also release these signals. This results in two problems. First, the atria receive so many signals that they do not have time to fully contract. Second, the ventricles, which can only receive one signal at a time, beat irregularly and out of rhythm with the atria.  °There are three types of atrial fibrillation:  °· Paroxysmal. Paroxysmal atrial fibrillation starts suddenly and stops on its own within a week. °· Persistent. Persistent atrial fibrillation lasts for more than a week. It may stop on its own or with treatment. °· Permanent. Permanent atrial fibrillation does not go away. Episodes of atrial fibrillation may lead to permanent atrial fibrillation. °Atrial fibrillation can prevent your heart from pumping blood normally. It increases your risk of stroke and can lead to heart failure.  °CAUSES  °· Heart conditions, including a heart attack, heart failure, coronary artery disease, and heart valve conditions.   °· Inflammation of the sac that surrounds the heart (pericarditis). °· Blockage of an artery in the lungs (pulmonary  embolism). °· Pneumonia or other infections. °· Chronic lung disease. °· Thyroid problems, especially if the thyroid is overactive (hyperthyroidism). °· Caffeine, excessive alcohol use, and use of some illegal drugs.   °· Use of some medicines, including certain decongestants and diet pills. °· Heart surgery.   °· Birth defects.   °Sometimes, no cause can be found. When this happens, the atrial fibrillation is called lone atrial fibrillation. The risk of complications from atrial fibrillation increases if you have lone atrial fibrillation and you are age 60 years or older. °RISK FACTORS °· Heart failure. °· Coronary artery disease. °· Diabetes mellitus.   °· High blood pressure (hypertension).   °· Obesity.   °· Other arrhythmias.   °· Increased age. °SIGNS AND SYMPTOMS  °· A feeling that your heart is beating rapidly or irregularly.   °· A feeling of discomfort or pain in your chest.   °· Shortness of breath.   °· Sudden light-headedness or weakness.   °· Getting tired easily when exercising.   °· Urinating more often than normal (mainly when atrial fibrillation first begins).   °In paroxysmal atrial fibrillation, symptoms may start and suddenly stop. °DIAGNOSIS  °Your health care provider may be able to detect atrial fibrillation when taking your pulse. Your health care provider may have you take a test called an ambulatory electrocardiogram (ECG). An ECG records your heartbeat patterns over a 24-hour period. You may also have other tests, such as: °· Transthoracic echocardiogram (TTE). During echocardiography, sound waves are used to evaluate how blood flows through your heart. °· Transesophageal echocardiogram (TEE). °· Stress test. There is more than one type of stress test. If a stress test is needed, ask your health care provider about which type   is best for you. °· Chest X-ray exam. °· Blood tests. °· Computed tomography (CT). °TREATMENT  °Treatment may include: °· Treating any underlying conditions. For  example, if you have an overactive thyroid, treating the condition may correct atrial fibrillation. °· Taking medicine. Medicines may be given to control a rapid heart rate or to prevent blood clots, heart failure, or a stroke. °· Having a procedure to correct the rhythm of the heart: °¨ Electrical cardioversion. During electrical cardioversion, a controlled, low-energy shock is delivered to the heart through your skin. If you have chest pain, very low blood pressure, or sudden heart failure, this procedure may need to be done as an emergency. °¨ Catheter ablation. During this procedure, heart tissues that send the signals that cause atrial fibrillation are destroyed. °¨ Surgical ablation. During this surgery, thin lines of heart tissue that carry the abnormal signals are destroyed. This procedure can either be an open-heart surgery or a minimally invasive surgery. With the minimally invasive surgery, small cuts are made to access the heart instead of a large opening. °¨ Pulmonary venous isolation. During this surgery, tissue around the veins that carry blood from the lungs (pulmonary veins) is destroyed. This tissue is thought to carry the abnormal signals. °HOME CARE INSTRUCTIONS  °· Take medicines only as directed by your health care provider. Some medicines can make atrial fibrillation worse or recur. °· If blood thinners were prescribed by your health care provider, take them exactly as directed. Too much blood-thinning medicine can cause bleeding. If you take too little, you will not have the needed protection against stroke and other problems. °· Perform blood tests at home if directed by your health care provider. Perform blood tests exactly as directed. °· Quit smoking if you smoke. °· Do not drink alcohol. °· Do not drink caffeinated beverages such as coffee, soda, and some teas. You may drink decaffeinated coffee, soda, or tea.   °· Maintain a healthy weight. Do not use diet pills unless your health care  provider approves. They may make heart problems worse.   °· Follow diet instructions as directed by your health care provider. °· Exercise regularly as directed by your health care provider. °· Keep all follow-up visits as directed by your health care provider. This is important. °PREVENTION  °The following substances can cause atrial fibrillation to recur:  °· Caffeinated beverages. °· Alcohol. °· Certain medicines, especially those used for breathing problems. °· Certain herbs and herbal medicines, such as those containing ephedra or ginseng. °· Illegal drugs, such as cocaine and amphetamines. °Sometimes medicines are given to prevent atrial fibrillation from recurring. Proper treatment of any underlying condition is also important in helping prevent recurrence.  °SEEK MEDICAL CARE IF: °· You notice a change in the rate, rhythm, or strength of your heartbeat. °· You suddenly begin urinating more frequently. °· You tire more easily when exerting yourself or exercising. °SEEK IMMEDIATE MEDICAL CARE IF:  °· You have chest pain, abdominal pain, sweating, or weakness. °· You feel nauseous. °· You have shortness of breath. °· You suddenly have swollen feet and ankles. °· You feel dizzy. °· Your face or limbs feel numb or weak. °· You have a change in your vision or speech. °MAKE SURE YOU:  °· Understand these instructions. °· Will watch your condition. °· Will get help right away if you are not doing well or get worse. °Document Released: 04/23/2005 Document Revised: 09/07/2013 Document Reviewed: 06/03/2012 °[employer]® Patient Information ©2015 [employer]. This information is not intended to   replace advice given to you by your health care provider. Make sure you discuss any questions you have with your health care provider. ° °

## 2015-02-01 NOTE — ED Notes (Signed)
Pt states at work today 2 coworkers got into an argument and she began having palpitations and having diaphoresis.Marland Kitchenstates palpations have resolved but "just does feel right now".the patient states she has a hx of a-fib, SR noted in triage.Marland Kitchen

## 2015-02-01 NOTE — ED Provider Notes (Signed)
[facility] Provider Note   ____________________________________________  Time seen: 1540  I have reviewed the triage vital signs and the nursing notes.   HISTORY  Chief Complaint Palpitations   History limited by: Not Limited   HPI [name] is a 59 y.o. female who presents to the emergency department today because of concerns for palpitations and chest pressure. The patient states she was at work, she works as a [employer], when two people started getting in an argument.She stated she felt like her heart was going in and out of A. fib. She also started developing some chest tightness, shortness breath and diaphoresis. The episode of vomiting off A. fib last roughly 2 hours. She no longer has it. The chest pressure was in the center of her chest. The patient states that she will have episodes of A. fib most days however is usually able to get herself out of it. She felt like today's episode was much harder to control. She does not have any history of heart attack that she is aware of.      Past Medical History  Diagnosis Date  . Other and unspecified ovarian cyst     left  . Other chest pain   . COPD (chronic obstructive pulmonary disease)   . Tobacco use disorder   . Anxiety state, unspecified   . Unspecified sleep apnea     resolved s/p UPPP surgery  . Other and unspecified hyperlipidemia   . PAF (paroxysmal atrial fibrillation)     a. 07/2007 Echo: EF 65%, mildly dil LA;  b. currently on propafenone;  c. CHA2DS2VASc = 1 (gender)-->No anticoagulation.  Marland Kitchen PTSD (post-traumatic stress disorder)   . Depression   . GERD (gastroesophageal reflux disease)   . Nontoxic multinodular goiter   . Unspecified hypothyroidism   . Migraines   . Tobacco use   . Vitamin B12 deficiency   . CTS (carpal tunnel syndrome)   . Neck pain   . Back pain   . Chest pain     a. 2008 Cath: reportedly nl;  b. 09/2012 Lexi MV: EF 75%, soft  tissue attenuation->Low risk.    Patient Active Problem List   Diagnosis Date Noted  . Muscle spasms of head and/or neck 11/11/2014  . Neck pain of over 3 months duration 10/21/2014  . Somatic dysfunction of head region 10/21/2014  . Depression   . GERD (gastroesophageal reflux disease)   . Hypothyroidism   . Migraines   . Tobacco use   . Depression, major, recurrent, moderate 07/20/2014    Class: Chronic  . Atrial fibrillation 08/29/2012  . Dyspnea on exertion 08/29/2012  . ACUTE BRONCHITIS 07/06/2008  . DEGENERATIVE JOINT DISEASE 02/25/2008  . ANXIETY DEPRESSION 09/03/2007  . HYPERLIPIDEMIA 01/29/2007  . COPD 01/29/2007  . GOITER, MULTINODULAR 01/28/2007  . HYPOTHYROIDISM 01/28/2007  . ANXIETY 01/28/2007  . TOBACCO ABUSE 01/28/2007  . GERD 01/28/2007  . CHEST PAIN, ATYPICAL 01/28/2007  . OVARIAN CYST, LEFT 05/14/2006    Past Surgical History  Procedure Laterality Date  . Cardiac catheterization  08/12/06    neg. Dr. Welton Flakes, cardio  . Stress cardiolite  10/06/96    nl, EF 62%  . Stress myoview  10/25/03    Dr. Park Breed, nl  . Cholecystectomy    . Nasal polyp surgery      nasal ?  . Thyroidectomy  5/92    massive goiter  . Uvulopalatopharyngoplasty      and tonsilectomy. [employer]  [place] 10/04  . Mri lumber spine      03/12/06-abn  . Mri pelvis  05/02/06    abn-[facility], neurosurg  . Parathyroidectomy    . Bladder tac    . Foot surgery      bone removal from her foot  . Tubal ligation  1988  . Tonsillectomy      Current Outpatient Rx  Name  Route  Sig  Dispense  Refill  . buPROPion (WELLBUTRIN XL) 150 MG 24 hr tablet   Oral   Take 150 mg by mouth daily.           . busPIRone (BUSPAR) 15 MG tablet   Oral   Take 15 mg by mouth 2 (two) times daily.         . cyanocobalamin (,VITAMIN B-12,) 1000 MCG/ML injection   Intramuscular   Inject 1,000 mcg into the muscle every 30 (thirty) days.         . diazepam (VALIUM) 5 MG tablet   Oral   Take 0.5 tablets (2.5  mg total) by mouth every 6 (six) hours as needed for muscle spasms. Take 1/2 tablet every 6 hours as needed   10 tablet   0   . levothyroxine (SYNTHROID, LEVOTHROID) 150 MCG tablet   Oral   Take 1 tablet (150 mcg total) by mouth daily.   30 tablet   5   . omeprazole (PRILOSEC) 20 MG capsule   Oral   Take 20 mg by mouth daily as needed.          . propafenone (RYTHMOL) 225 MG tablet   Oral   Take 1 tablet (225 mg total) by mouth 2 (two) times daily.   60 tablet   11     Allergies Bee venom; Penicillins; and Sulfonamide derivatives  Family History  Problem Relation Age of Onset  . Heart attack      both sides  . Stroke      both sides  . Depression      both sides  . Prostate cancer Neg Hx   . Colon cancer Neg Hx   . Stroke Father   . Heart disease Mother   . Atrial fibrillation Mother   . Congestive Heart Failure Mother   . Atrial fibrillation Sister     s/p ablation  . Hyperlipidemia Daughter   . Heart disease Daughter     leaky valve  . Atrial fibrillation Other     Social History Social History  Substance Use Topics  . Smoking status: Current Every Day Smoker -- 0.50 packs/day for 20 years    Types: Cigarettes  . Smokeless tobacco: Never Used  . Alcohol Use: 0.0 oz/week    0 Standard drinks or equivalent per week     Comment: Rare    Review of Systems  Constitutional: Negative for fever. Cardiovascular: Positive for chest pressure Respiratory: Negative for shortness of breath. Gastrointestinal: Negative for abdominal pain, vomiting and diarrhea. Genitourinary: Negative for dysuria. Musculoskeletal: Negative for back pain. Skin: Negative for rash. Neurological: Negative for headaches, focal weakness or numbness.  10-point ROS otherwise negative.  ____________________________________________   PHYSICAL EXAM:  VITAL SIGNS: ED Triage Vitals  Enc Vitals Group     BP 02/01/15 1406 154/88 mmHg     Pulse Rate 02/01/15 1406 85     Resp  02/01/15 1406 16     Temp 02/01/15 1406 97.8 F (36.6 C)     Temp Source 02/01/15 1406 Oral  SpO2 02/01/15 1406 97 %     Weight 02/01/15 1406 192 lb (87.091 kg)     Height 02/01/15 1406  (1.651 m)   Constitutional: Alert and oriented. Well appearing and in no distress. Eyes: Conjunctivae are normal. PERRL. Normal extraocular movements. ENT   Head: Normocephalic and atraumatic.   Nose: No congestion/rhinnorhea.   Mouth/Throat: Mucous membranes are moist.   Neck: No stridor. Hematological/Lymphatic/Immunilogical: No cervical lymphadenopathy. Cardiovascular: Normal rate, regular rhythm.  No murmurs, rubs, or gallops. Respiratory: Normal respiratory effort without tachypnea nor retractions. Breath sounds are clear and equal bilaterally. No wheezes/rales/rhonchi. Gastrointestinal: Soft and nontender. No distention.  Genitourinary: Deferred Musculoskeletal: Normal range of motion in all extremities. No joint effusions.  No lower extremity tenderness nor edema. Neurologic:  Normal speech and language. No gross focal neurologic deficits are appreciated. Speech is normal.  Skin:  Skin is warm, dry and intact. No rash noted. Psychiatric: Mood and affect are normal. Speech and behavior are normal. Patient exhibits appropriate insight and judgment.  ____________________________________________    LABS (pertinent positives/negatives)  Labs Reviewed  BASIC METABOLIC PANEL - Abnormal; Notable for the following:    Glucose, Bld 128 (*)    Creatinine, Ser 1.03 (*)    GFR calc non Af Amer 58 (*)    All other components within normal limits  CBC  TROPONIN I     ____________________________________________   EKG  I, Phineas Semen, attending physician, personally viewed and interpreted this EKG  EKG Time: 1403 Rate: 89 Rhythm: NSR Axis: left axis deviation Intervals: qtc 464 QRS: narrow, q waves V2 ST changes: no st elevation Impression: abnormal  ekg ____________________________________________    RADIOLOGY  CXR  IMPRESSION: No active cardiopulmonary disease.  I, GOODMAN, GRAYDON, personally viewed and evaluated these images (plain radiographs) as part of my medical decision making. ____________________________________________   PROCEDURES  Procedure(s) performed: None  Critical Care performed: No  ____________________________________________   INITIAL IMPRESSION / ASSESSMENT AND PLAN / ED COURSE  Pertinent labs & imaging results that were available during my care of the patient were reviewed by me and considered in my medical decision making (see chart for details).  Patient presented to the emergency department today because of concerns for possible palpitations and some chest pressure today. 2 sets of troponin was negative. EKG without concerning findings. Chest x-ray without concerning findings. This point the patient either had paroxysmal A. Fib which she states she gets or a panic attack. I did discussed importance of following up with her  cardiologistalso discussed return precautions.  ____________________________________________   FINAL CLINICAL IMPRESSION(S) / ED DIAGNOSES  Final diagnoses:  Palpitations  Panic attack     Phineas Semen, MD 02/01/15 1818

## 2015-02-03 ENCOUNTER — Ambulatory Visit (INDEPENDENT_AMBULATORY_CARE_PROVIDER_SITE_OTHER): Payer: 59 | Admitting: Family Medicine

## 2015-02-03 ENCOUNTER — Encounter: Payer: Self-pay | Admitting: Family Medicine

## 2015-02-03 VITALS — BP 121/81 | HR 72 | Temp 98.7°F | Ht 63.75 in | Wt 195.0 lb

## 2015-02-03 DIAGNOSIS — Z0000 Encounter for general adult medical examination without abnormal findings: Secondary | ICD-10-CM | POA: Diagnosis not present

## 2015-02-03 DIAGNOSIS — J330 Polyp of nasal cavity: Secondary | ICD-10-CM

## 2015-02-03 DIAGNOSIS — E785 Hyperlipidemia, unspecified: Secondary | ICD-10-CM | POA: Diagnosis not present

## 2015-02-03 DIAGNOSIS — Z114 Encounter for screening for human immunodeficiency virus [HIV]: Secondary | ICD-10-CM | POA: Diagnosis not present

## 2015-02-03 DIAGNOSIS — N183 Chronic kidney disease, stage 3 unspecified: Secondary | ICD-10-CM

## 2015-02-03 DIAGNOSIS — Z1159 Encounter for screening for other viral diseases: Secondary | ICD-10-CM

## 2015-02-03 DIAGNOSIS — R5382 Chronic fatigue, unspecified: Secondary | ICD-10-CM

## 2015-02-03 DIAGNOSIS — E038 Other specified hypothyroidism: Secondary | ICD-10-CM

## 2015-02-03 DIAGNOSIS — Z720 Tobacco use: Secondary | ICD-10-CM | POA: Diagnosis not present

## 2015-02-03 MED ORDER — MOMETASONE FUROATE 50 MCG/ACT NA SUSP: 2.0000 | Freq: Every day | NASAL | Status: DC

## 2015-02-03 NOTE — Progress Notes (Signed)
[name], female   DOB: 1956/03/21, 59 y.o.   MRN: [id number]  Subjective:   [name] is a 59 y.o. female here for a complete physical exam  Interim issues since last visit: BP was 180/98 two days ago she was at the ER; employee became angry, threatening and then quit; she is much more zen today  USPSTF grade A and B recommendations Alcohol: not an issue, holidays Depression screen [facility] 2/9 02/03/2015  Decreased Interest 2  Down, Depressed, Hopeless 2  PHQ - 2 Score 4  Altered sleeping 2  Tired, decreased energy 3  Change in appetite 2  Feeling bad or failure about yourself  0  Trouble concentrating 1  Moving slowly or fidgety/restless 1  Suicidal thoughts 0  PHQ-9 Score 13  taking wellbutrin; just had thyroid level rechecked; heart is doing okay, episode 2 days ago; no fluid overload; not eating well HTN: controlled Obesity: highest weight ever, 220 to 222 pounds (nonpregnant) Tobacco: 1/2 ppd now; less than 30 pack year HIV, Hep B, Hep C: she agrees to having these checked Abuse: not other than work, she jokes and she just quit her job BRCA: no ovarian / breast cancer Colorectal cancer: patient declines, does not even want stool cards x 3 (offered) Cervical cancer: done at [facility] Lipids: she does not want to take medicine for her cholesterol; want to do diet Glucose: just check two days ago nonfasting, only 128 Mammogram: with [facility] Osteoporosis: low risk, start at 52 Aspirin: patient not interested, she was told to NEVER take it again; went through a-fib for years; cardiologist told her to not take Fall prevention: discussed Skin cancer: wears sunscreen, no tanning beds Exercise: doesn't exercise but not sedentary  Past Medical History  Diagnosis Date  . Other and unspecified ovarian cyst     left  . Other chest pain   . COPD (chronic obstructive pulmonary disease)   . Tobacco use disorder   . Anxiety state, unspecified   . Unspecified  sleep apnea     resolved s/p UPPP surgery  . Other and unspecified hyperlipidemia   . PAF (paroxysmal atrial fibrillation)     a. 07/2007 Echo: EF 65%, mildly dil LA;  b. currently on propafenone;  c. CHA2DS2VASc = 1 (gender)-->No anticoagulation.  Marland Kitchen PTSD (post-traumatic stress disorder)   . Depression   . GERD (gastroesophageal reflux disease)   . Nontoxic multinodular goiter   . Unspecified hypothyroidism   . Migraines   . Tobacco use   . Vitamin B12 deficiency   . CTS (carpal tunnel syndrome)   . Neck pain   . Back pain   . Chest pain     a. 2008 Cath: reportedly nl;  b. 09/2012 Lexi MV: EF 75%, soft tissue attenuation->Low risk.   Past Surgical History  Procedure Laterality Date  . Cardiac catheterization  08/12/06    neg. Dr. Humphrey Rolls, cardio  . Stress cardiolite  10/06/96    nl, EF 62%  . Stress myoview  10/25/03    Dr. Chancy Milroy, nl  . Cholecystectomy    . Nasal polyp surgery      nasal ?  . Thyroidectomy  5/92    massive goiter  . Uvulopalatopharyngoplasty      and tonsilectomy. [name] 10/04  . Mri lumber spine      03/12/06-abn  . Mri pelvis  05/02/06    abn-[facility], neurosurg  . Parathyroidectomy    . Bladder tac    .  Foot surgery      bone removal from her foot  . Tubal ligation  1988  . Tonsillectomy     Family History  Problem Relation Age of Onset  . Heart attack      both sides  . Stroke      both sides  . Depression      both sides  . Prostate cancer Neg Hx   . Colon cancer Neg Hx   . Stroke Father   . Heart disease Mother   . Atrial fibrillation Mother   . Congestive Heart Failure Mother   . Atrial fibrillation Sister     s/p ablation  . Hyperlipidemia Daughter   . Heart disease Daughter     leaky valve  . Atrial fibrillation Other    Social History  Substance Use Topics  . Smoking status: Current Every Day Smoker -- 0.50 packs/day for 20 years    Types: Cigarettes  . Smokeless tobacco: Never Used  . Alcohol Use: 0.0 oz/week    0 Standard  drinks or equivalent per week     Comment: Rare   Review of Systems  Constitutional: Positive for chills (woke up 3 weeks ago, just freezing; unusual; none in the last 2 weeks). Negative for fever and unexpected weight change.  HENT: Negative for hearing loss and nosebleeds (nosebleed on her birthday, bizarre [birth year]; had consumed a lot of sugar that day).   Eyes: Negative for visual disturbance.  Respiratory: Negative for wheezing.   Cardiovascular: Positive for chest pain (evaluated in the ER two days ago, negative trop in lab section reviewed).  Gastrointestinal: Negative for blood in stool.  Endocrine: Positive for heat intolerance. Negative for polydipsia and polyuria.  Genitourinary: Negative for dysuria and hematuria.  Musculoskeletal:       Pain from muscle spasm, sees Dr. Wynetta Emery  Allergic/Immunologic: Negative for food allergies.  Neurological: Negative for tremors.  Hematological: Bruises/bleeds easily (bruises a lot easier than she used to).  Psychiatric/Behavioral: Positive for sleep disturbance (takes her forever to go to sleep, doesn't stay asleep) and agitation (so upset the other day, she couldn't control herself about employee situation). The patient is nervous/anxious (panic attack in December; thinks another panic attack two days ago).     Objective:   Filed Vitals:   02/03/15 1406  BP: 121/81  Pulse: 72  Temp: 98.7 F (37.1 C)  Height: 5' 3.75" (1.619 m)  Weight: 195 lb (88.451 kg)  SpO2: 98%   Body mass index is 33.74 kg/(m^2). Wt Readings from Last 3 Encounters:  02/03/15 195 lb (88.451 kg)  02/01/15 192 lb (87.091 kg)  01/26/15 196 lb (88.905 kg)   Physical Exam  Constitutional: She appears well-developed and well-nourished.  HENT:  Head: Normocephalic and atraumatic.  Right Ear: Hearing, tympanic membrane, external ear and ear canal normal. Tympanic membrane is not erythematous. No middle ear effusion.  Left Ear: Hearing, tympanic membrane,  external ear and ear canal normal. Tympanic membrane is not erythematous.  No middle ear effusion.  Mouth/Throat: Oropharynx is clear and moist and mucous membranes are normal.  Polyp posterior aspect right nostril; pink and mooth  Eyes: Conjunctivae and EOM are normal. Right eye exhibits no hordeolum. Left eye exhibits no hordeolum. No scleral icterus.  Neck: Carotid bruit is not present. No thyromegaly present.  Surgical scar lower anterior neck consistent with previous thyroidectomy  Cardiovascular: Normal rate, regular rhythm, S1 normal, S2 normal and normal heart sounds.   No extrasystoles are present.  Pulmonary/Chest: Effort normal and breath sounds normal. No respiratory distress.  Abdominal: Soft. Normal appearance and bowel sounds are normal. She exhibits no distension, no abdominal bruit, no pulsatile midline mass and no mass. There is no hepatosplenomegaly. There is no tenderness. No hernia.  Musculoskeletal: Normal range of motion. She exhibits no edema.  Lymphadenopathy:       Head (right side): No submandibular adenopathy present.       Head (left side): No submandibular adenopathy present.    She has no cervical adenopathy.    She has no axillary adenopathy.  Neurological: She is alert. She displays no tremor. No cranial nerve deficit. She exhibits normal muscle tone. Gait normal.  Reflex Scores:      Patellar reflexes are 2+ on the right side and 2+ on the left side. Skin: Skin is warm and dry. No bruising and no ecchymosis noted. No cyanosis. No pallor.  Psychiatric: Her speech is normal and behavior is normal. Thought content normal. Her mood appears not anxious. She does not exhibit a depressed mood.   Assessment/Plan:   Problem List Items Addressed This Visit      Endocrine   Hypothyroidism    TSH just checked in ER, TSH was 1.020 on September 26th; next TSH due in one year, sooner if weight loss, s/s of over- or under-replacement        Genitourinary   CKD  (chronic kidney disease) stage 3, GFR 30-59 ml/min    GFR had been 60 in March of 2015, then 63 in  Sept of 2015; now, it has just dipped below 60 (58) per labs done in the ER; reviewed and discussed with patient; avoid NSAIDs; monitor periodically        Other   Hyperlipidemia    She does not want to take statins; will check fasting lipids when she returns next week      Tobacco use    Patient not ready to quit smoking right now; she has smoked about 10 pack years, so does not qualify for low dose chest CT; I am here to help if/when she is ready to quit      Nasal polyp, posterior    Start nasal corticosteroid      Preventative health care - Primary    USPSTF grade A and B recommendations reviewed; age-appropriate recommendations encouraged; patient agrees with HIV, Hep B, Hep C testing; denies colonoscopy and stool cards; gyn components done through gyn office; DEXA at age 79; calcium intake, safe living      Relevant Orders   Lipid Panel w/o Chol/HDL Ratio   Comprehensive metabolic panel   Chronic fatigue   Relevant Orders   Vitamin B12   Vit D  25 hydroxy (rtn osteoporosis monitoring)    Other Visit Diagnoses    Screening for HIV (human immunodeficiency virus)        check HIV per recommendations for one-time screening purposes    Relevant Orders    HIV antibody    Need for hepatitis B screening test        check hep B per recommendations for one-time screening purposes    Relevant Orders    Hepatitis B surface antigen    Need for hepatitis C screening test        check hep C per recommendations for one-time screening purposes    Relevant Orders    Hepatitis C antibody       Follow up plan: Return in about 1 year (around  02/03/2016) for next physical.  An after-visit summary was printed and given to the patient at [place].  Please see the patient instructions which may contain other information and recommendations beyond what is mentioned above in the assessment  and plan.  Orders Placed This Encounter  Procedures  . Vitamin B12  . Vit D  25 hydroxy (rtn osteoporosis monitoring)  . Lipid Panel w/o Chol/HDL Ratio  . Comprehensive metabolic panel  . HIV antibody  . Hepatitis B surface antigen  . Hepatitis C antibody    Meds ordered this encounter  Medications  . mometasone (NASONEX) 50 MCG/ACT nasal spray    Sig: Place 2 sprays into the nose daily.    Dispense:  17 g    Refill:  12

## 2015-02-03 NOTE — Patient Instructions (Addendum)
Check out the information at [facility].org entitled "What It Takes to Lose Weight" Try to lose between 1-2 pounds per week by taking in fewer calories and burning off more calories You can succeed by limiting portions, limiting foods dense in calories and fat, becoming more active, and drinking 8 glasses of water a day Don't skip meals, especially breakfast, as skipping meals may alter your metabolism Do not use over-the-counter weight loss pills or gimmicks that claim rapid weight loss A healthy BMI (or body mass index) is between 18.5 and 24.9 You can calculate your ideal BMI at the [place] website [employer]  Return next week for labs Avoid NSAIDs If you need something for aches or pains, try to use Tylenol (acetaminphen) instead of non-steroidals (which include Aleve, ibuprofen, Advil, Motrin, and naproxen); non-steroidals can cause long-term kidney damage Try turmeric as a natural anti-inflammatory (for pain and arthritis). It comes in capsules where you buy aspirin and fish oil, but also as a spice where you buy pepper and garlic powder. Start daily multiple vitamin (without extra iron)  Please do quit smoking  Please return for visit for other issues if needed  Health Maintenance Adopting a healthy lifestyle and getting preventive care can go a long way to promote health and wellness. Talk with your health care provider about what schedule of regular examinations is right for you. This is a good chance for you to check in with your provider about disease prevention and staying healthy. In between checkups, there are plenty of things you can do on your own. Experts have done a lot of research about which lifestyle changes and preventive measures are most likely to keep you healthy. Ask your health care provider for more information. WEIGHT AND DIET  Eat a healthy diet  Be sure to include plenty of vegetables, fruits, low-fat dairy  products, and lean protein.  Do not eat a lot of foods high in solid fats, added sugars, or salt.  Get regular exercise. This is one of the most important things you can do for your health.  Most adults should exercise for at least 150 minutes each week. The exercise should increase your heart rate and make you sweat (moderate-intensity exercise).  Most adults should also do strengthening exercises at least twice a week. This is in addition to the moderate-intensity exercise.  Maintain a healthy weight  Body mass index (BMI) is a measurement that can be used to identify possible weight problems. It estimates body fat based on height and weight. Your health care provider can help determine your BMI and help you achieve or maintain a healthy weight.  For females 90 years of age and older:   A BMI below 18.5 is considered underweight.  A BMI of 18.5 to 24.9 is normal.  A BMI of 25 to 29.9 is considered overweight.  A BMI of 30 and above is considered obese.  Watch levels of cholesterol and blood lipids  You should start having your blood tested for lipids and cholesterol at 59 years of age, then have this test every 5 years.  You may need to have your cholesterol levels checked more often if:  Your lipid or cholesterol levels are high.  You are older than 59 years of age.  You are at high risk for heart disease.  CANCER SCREENING   Lung Cancer  Lung cancer screening is recommended for adults 39-15 years old who are at high risk for lung cancer because of a history of smoking.  A yearly low-dose CT scan of the lungs is recommended for people who:  Currently smoke.  Have quit within the past 15 years.  Have at least a 30-pack-year history of smoking. A pack year is smoking an average of one pack of cigarettes a day for 1 year.  Yearly screening should continue until it has been 15 years since you quit.  Yearly screening should stop if you develop a health problem that  would prevent you from having lung cancer treatment.  Breast Cancer  Practice breast self-awareness. This means understanding how your breasts normally appear and feel.  It also means doing regular breast self-exams. Let your health care provider know about any changes, no matter how small.  If you are in your 20s or 30s, you should have a clinical breast exam (CBE) by a health care provider every 1-3 years as part of a regular health exam.  If you are 66 or older, have a CBE every year. Also consider having a breast X-ray (mammogram) every year.  If you have a family history of breast cancer, talk to your health care provider about genetic screening.  If you are at high risk for breast cancer, talk to your health care provider about having an MRI and a mammogram every year.  Breast cancer gene (BRCA) assessment is recommended for women who have family members with BRCA-related cancers. BRCA-related cancers include:  Breast.  Ovarian.  Tubal.  Peritoneal cancers.  Results of the assessment will determine the need for genetic counseling and BRCA1 and BRCA2 testing. Cervical Cancer Routine pelvic examinations to screen for cervical cancer are no longer recommended for nonpregnant women who are considered low risk for cancer of the pelvic organs (ovaries, uterus, and vagina) and who do not have symptoms. A pelvic examination may be necessary if you have symptoms including those associated with pelvic infections. Ask your health care provider if a screening pelvic exam is right for you.   The Pap test is the screening test for cervical cancer for women who are considered at risk.  If you had a hysterectomy for a problem that was not cancer or a condition that could lead to cancer, then you no longer need Pap tests.  If you are older than 65 years, and you have had normal Pap tests for the past 10 years, you no longer need to have Pap tests.  If you have had past treatment for cervical  cancer or a condition that could lead to cancer, you need Pap tests and screening for cancer for at least 20 years after your treatment.  If you no longer get a Pap test, assess your risk factors if they change (such as having a new sexual partner). This can affect whether you should start being screened again.  Some women have medical problems that increase their chance of getting cervical cancer. If this is the case for you, your health care provider may recommend more frequent screening and Pap tests.  The human papillomavirus (HPV) test is another test that may be used for cervical cancer screening. The HPV test looks for the virus that can cause cell changes in the cervix. The cells collected during the Pap test can be tested for HPV.  The HPV test can be used to screen women 36 years of age and older. Getting tested for HPV can extend the interval between normal Pap tests from three to five years.  An HPV test also should be used to screen women of any  age who have unclear Pap test results.  After 59 years of age, women should have HPV testing as often as Pap tests.  Colorectal Cancer  This type of cancer can be detected and often prevented.  Routine colorectal cancer screening usually begins at 59 years of age and continues through 59 years of age.  Your health care provider may recommend screening at an earlier age if you have risk factors for colon cancer.  Your health care provider may also recommend using home test kits to check for hidden blood in the stool.  A small camera at the end of a tube can be used to examine your colon directly (sigmoidoscopy or colonoscopy). This is done to check for the earliest forms of colorectal cancer.  Routine screening usually begins at age 52.  Direct examination of the colon should be repeated every 5-10 years through 59 years of age. However, you may need to be screened more often if early forms of precancerous polyps or small growths are  found. Skin Cancer  Check your skin from head to toe regularly.  Tell your health care provider about any new moles or changes in moles, especially if there is a change in a mole's shape or color.  Also tell your health care provider if you have a mole that is larger than the size of a pencil eraser.  Always use sunscreen. Apply sunscreen liberally and repeatedly throughout the day.  Protect yourself by wearing long sleeves, pants, a wide-brimmed hat, and sunglasses whenever you are outside. HEART DISEASE, DIABETES, AND HIGH BLOOD PRESSURE   Have your blood pressure checked at least every 1-2 years. High blood pressure causes heart disease and increases the risk of stroke.  If you are between 32 years and 53 years old, ask your health care provider if you should take aspirin to prevent strokes.  Have regular diabetes screenings. This involves taking a blood sample to check your fasting blood sugar level.  If you are at a normal weight and have a low risk for diabetes, have this test once every three years after 59 years of age.  If you are overweight and have a high risk for diabetes, consider being tested at a younger age or more often. PREVENTING INFECTION  Hepatitis B  If you have a higher risk for hepatitis B, you should be screened for this virus. You are considered at high risk for hepatitis B if:  You were born in a country where hepatitis B is common. Ask your health care provider which countries are considered high risk.  Your parents were born in a high-risk country, and you have not been immunized against hepatitis B (hepatitis B vaccine).  You have HIV or AIDS.  You use needles to inject street drugs.  You live with someone who has hepatitis B.  You have had sex with someone who has hepatitis B.  You get hemodialysis treatment.  You take certain medicines for conditions, including cancer, organ transplantation, and autoimmune conditions. Hepatitis C  Blood  testing is recommended for:  Everyone born from [birth year] through 1965.  Anyone with known risk factors for hepatitis C. Sexually transmitted infections (STIs)  You should be screened for sexually transmitted infections (STIs) including gonorrhea and chlamydia if:  You are sexually active and are younger than 59 years of age.  You are older than 59 years of age and your health care provider tells you that you are at risk for this type of infection.  Your sexual  activity has changed since you were last screened and you are at an increased risk for chlamydia or gonorrhea. Ask your health care provider if you are at risk.  If you do not have HIV, but are at risk, it may be recommended that you take a prescription medicine daily to prevent HIV infection. This is called pre-exposure prophylaxis (PrEP). You are considered at risk if:  You are sexually active and do not regularly use condoms or know the HIV status of your partner(s).  You take drugs by injection.  You are sexually active with a partner who has HIV. Talk with your health care provider about whether you are at high risk of being infected with HIV. If you choose to begin PrEP, you should first be tested for HIV. You should then be tested every 3 months for as long as you are taking PrEP.  PREGNANCY   If you are premenopausal and you may become pregnant, ask your health care provider about preconception counseling.  If you may become pregnant, take 400 to 800 micrograms (mcg) of folic acid every day.  If you want to prevent pregnancy, talk to your health care provider about birth control (contraception). OSTEOPOROSIS AND MENOPAUSE   Osteoporosis is a disease in which the bones lose minerals and strength with aging. This can result in serious bone fractures. Your risk for osteoporosis can be identified using a bone density scan.  If you are 36 years of age or older, or if you are at risk for osteoporosis and fractures, ask your  health care provider if you should be screened.  Ask your health care provider whether you should take a calcium or vitamin D supplement to lower your risk for osteoporosis.  Menopause may have certain physical symptoms and risks.  Hormone replacement therapy may reduce some of these symptoms and risks. Talk to your health care provider about whether hormone replacement therapy is right for you.  HOME CARE INSTRUCTIONS   Schedule regular health, dental, and eye exams.  Stay current with your immunizations.   Do not use any tobacco products including cigarettes, chewing tobacco, or electronic cigarettes.  If you are pregnant, do not drink alcohol.  If you are breastfeeding, limit how much and how often you drink alcohol.  Limit alcohol intake to no more than 1 drink per day for nonpregnant women. One drink equals 12 ounces of beer, 5 ounces of wine, or 1 ounces of hard liquor.  Do not use street drugs.  Do not share needles.  Ask your health care provider for help if you need support or information about quitting drugs.  Tell your health care provider if you often feel depressed.  Tell your health care provider if you have ever been abused or do not feel safe at home. Document Released: 11/06/2010 Document Revised: 09/07/2013 Document Reviewed: 03/25/2013 [employer] Patient Information 2015 [place], [place]. This information is not intended to replace advice given to you by your health care provider. Make sure you discuss any questions you have with your health care provider. You Can Quit Smoking If you are ready to quit smoking or are thinking about it, congratulations! You have chosen to help yourself be healthier and live longer! There are lots of different ways to quit smoking. Nicotine gum, nicotine patches, a nicotine inhaler, or nicotine nasal spray can help with physical craving. Hypnosis, support groups, and medicines help break the habit of smoking. TIPS TO GET OFF AND STAY  OFF CIGARETTES  Learn to predict  your moods. Do not let a bad situation be your excuse to have a cigarette. Some situations in your life might tempt you to have a cigarette.  Ask friends and co-workers not to smoke around you.  Make your home smoke-free.  Never have "just one" cigarette. It leads to wanting another and another. Remind yourself of your decision to quit.  On a card, make a list of your reasons for not smoking. Read it at least the same number of times a day as you have a cigarette. Tell yourself everyday, "I do not want to smoke. I choose not to smoke."  Ask someone at home or work to help you with your plan to quit smoking.  Have something planned after you eat or have a cup of coffee. Take a walk or get other exercise to perk you up. This will help to keep you from overeating.  Try a relaxation exercise to calm you down and decrease your stress. Remember, you may be tense and nervous the first two weeks after you quit. This will pass.  Find new activities to keep your hands busy. Play with a pen, coin, or rubber band. Doodle or draw things on paper.  Brush your teeth right after eating. This will help cut down the craving for the taste of tobacco after meals. You can try mouthwash too.  Try gum, breath mints, or diet candy to keep something in your mouth. IF YOU SMOKE AND WANT TO QUIT:  Do not stock up on cigarettes. Never buy a carton. Wait until one pack is finished before you buy another.  Never carry cigarettes with you at work or at home.  Keep cigarettes as far away from you as possible. Leave them with someone else.  Never carry matches or a lighter with you.  Ask yourself, "Do I need this cigarette or is this just a reflex?"  Bet with someone that you can quit. Put cigarette money in a piggy bank every morning. If you smoke, you give up the money. If you do not smoke, by the end of the week, you keep the money.  Keep trying. It takes 21 days to change a  habit!  Talk to your doctor about using medicines to help you quit. These include nicotine replacement gum, lozenges, or skin patches. Document Released: 02/17/2009 Document Revised: 07/16/2011 Document Reviewed: 02/17/2009 [facility] Patient Information 2015 [place], [place]. This information is not intended to replace advice given to you by your health care provider. Make sure you discuss any questions you have with your health care provider.

## 2015-02-06 DIAGNOSIS — N183 Chronic kidney disease, stage 3 unspecified: Secondary | ICD-10-CM | POA: Insufficient documentation

## 2015-02-06 DIAGNOSIS — R5382 Chronic fatigue, unspecified: Secondary | ICD-10-CM | POA: Insufficient documentation

## 2015-02-06 NOTE — Assessment & Plan Note (Signed)
USPSTF grade A and B recommendations reviewed; age-appropriate recommendations encouraged; patient agrees with HIV, Hep B, Hep C testing; denies colonoscopy and stool cards; gyn components done through gyn office; DEXA at age 59; calcium intake, safe living

## 2015-02-06 NOTE — Assessment & Plan Note (Addendum)
Patient not ready to quit smoking right now; she has smoked about 10 pack years, so does not qualify for low dose chest CT; I am here to help if/when she is ready to quit

## 2015-02-06 NOTE — Assessment & Plan Note (Signed)
TSH just checked in ER, TSH was 1.020 on September 26th; next TSH due in one year, sooner if weight loss, s/s of over- or under-replacement

## 2015-02-06 NOTE — Assessment & Plan Note (Signed)
GFR had been 60 in March of 2015, then 47 in  Sept of 2015; now, it has just dipped below 60 (58) per labs done in the ER; reviewed and discussed with patient; avoid NSAIDs; monitor periodically

## 2015-02-06 NOTE — Assessment & Plan Note (Signed)
She does not want to take statins; will check fasting lipids when she returns next week

## 2015-02-06 NOTE — Assessment & Plan Note (Signed)
Start nasal corticosteroid 

## 2015-02-09 ENCOUNTER — Other Ambulatory Visit: Payer: Self-pay | Admitting: Family Medicine

## 2015-02-09 ENCOUNTER — Encounter: Payer: Self-pay | Admitting: Family Medicine

## 2015-02-09 ENCOUNTER — Ambulatory Visit (INDEPENDENT_AMBULATORY_CARE_PROVIDER_SITE_OTHER): Payer: 59 | Admitting: Family Medicine

## 2015-02-09 VITALS — BP 129/84 | HR 78 | Temp 98.4°F | Ht 64.0 in | Wt 193.0 lb

## 2015-02-09 DIAGNOSIS — R52 Pain, unspecified: Secondary | ICD-10-CM | POA: Diagnosis not present

## 2015-02-09 DIAGNOSIS — J209 Acute bronchitis, unspecified: Secondary | ICD-10-CM | POA: Diagnosis not present

## 2015-02-09 DIAGNOSIS — R509 Fever, unspecified: Secondary | ICD-10-CM | POA: Diagnosis not present

## 2015-02-09 LAB — INFLUENZA A+B AG, EIA
Influenza A Ag, EIA: NEGATIVE
Influenza B Ag, EIA: NEGATIVE

## 2015-02-09 LAB — PLEASE NOTE:

## 2015-02-09 MED ORDER — BENZONATATE 200 MG PO CAPS: 200.0000 mg | ORAL_CAPSULE | Freq: Three times a day (TID) | ORAL | Status: DC | PRN

## 2015-02-09 MED ORDER — AZITHROMYCIN 250 MG PO TABS: ORAL_TABLET | ORAL | Status: DC

## 2015-02-09 MED ORDER — HYDROCOD POLST-CPM POLST ER 10-8 MG/5ML PO SUER: 5.0000 mL | Freq: Every evening | ORAL | Status: DC | PRN

## 2015-02-09 MED ORDER — ALBUTEROL SULFATE (2.5 MG/3ML) 0.083% IN NEBU: 2.5000 mg | INHALATION_SOLUTION | Freq: Once | RESPIRATORY_TRACT | Status: DC

## 2015-02-09 MED ORDER — PREDNISONE 10 MG PO TABS: ORAL_TABLET | ORAL | Status: DC

## 2015-02-09 NOTE — Progress Notes (Signed)
BP 129/84 mmHg  Pulse 78  Temp(Src) 98.4 F (36.9 C)  Ht  (1.626 m)  Wt 193 lb (87.544 kg)  BMI 33.11 kg/m2  SpO2 96%   Subjective:    [name], female    DOB: 08-15-1955, 59 y.o.   MRN: [id number]  HPI: [name] is a 59 y.o. female  Chief Complaint  Patient presents with  . URI    X 2 days. Fever,chills and body aches.   UPPER RESPIRATORY TRACT INFECTION x2 days Worst symptom: congestion in her chest Fever: yes Cough: yes Shortness of breath: yes Wheezing: yes Chest pain: yes, with cough Chest tightness: yes Chest congestion: yes Nasal congestion: yes Runny nose: yes Post nasal drip: yes Sneezing: no Sore throat: no Swollen glands: no Sinus pressure: yes Headache: yes Face pain: no Toothache: no Ear pain: no  Ear pressure: yes "right Eyes red/itching: yes- aching Eye drainage/crusting: no  Vomiting: no Rash: no Fatigue: yes Sick contacts: yes Strep contacts: no  Context: worse   Relevant past medical, surgical, family and social history reviewed and updated as indicated. Interim medical history since our last visit reviewed. Allergies and medications reviewed and updated.  Review of Systems  Constitutional: Negative.   HENT: Positive for congestion, ear pain, postnasal drip, rhinorrhea, sinus pressure and voice change. Negative for dental problem, drooling, ear discharge, facial swelling, hearing loss, mouth sores, nosebleeds, sneezing, sore throat, tinnitus and trouble swallowing.   Respiratory: Positive for cough, chest tightness, shortness of breath and wheezing. Negative for choking and stridor.   Cardiovascular: Positive for chest pain. Negative for palpitations and leg swelling.  Psychiatric/Behavioral: Negative.     Per HPI unless specifically indicated above     Objective:    BP 129/84 mmHg  Pulse 78  Temp(Src) 98.4 F (36.9 C)  Ht  (1.626 m)  Wt 193 lb (87.544 kg)  BMI 33.11 kg/m2  SpO2 96%  Wt  Readings from Last 3 Encounters:  02/09/15 193 lb (87.544 kg)  02/03/15 195 lb (88.451 kg)  02/01/15 192 lb (87.091 kg)    Physical Exam  Constitutional: She is oriented to person, place, and time. She appears well-developed and well-nourished. No distress.  HENT:  Head: Normocephalic and atraumatic.  Right Ear: Hearing, tympanic membrane and external ear normal.  Left Ear: Hearing, tympanic membrane, external ear and ear canal normal.  Nose: Mucosal edema present. No rhinorrhea, nose lacerations, sinus tenderness, nasal deformity, septal deviation or nasal septal hematoma. No epistaxis.  No foreign bodies. Right sinus exhibits no maxillary sinus tenderness and no frontal sinus tenderness. Left sinus exhibits no maxillary sinus tenderness and no frontal sinus tenderness.  Mouth/Throat: Uvula is midline, oropharynx is clear and moist and mucous membranes are normal. No oropharyngeal exudate, posterior oropharyngeal edema, posterior oropharyngeal erythema or tonsillar abscesses.  Erythematous area in canal on R  Eyes: Conjunctivae and lids are normal. Pupils are equal, round, and reactive to light. Right eye exhibits no discharge. Left eye exhibits no discharge. No scleral icterus.  Neck: Normal range of motion. Neck supple. No JVD present. No tracheal deviation present. No thyromegaly present.  Pulmonary/Chest: Effort normal. No stridor. No respiratory distress. She has decreased breath sounds in the right upper field, the right middle field, the right lower field, the left upper field, the left middle field and the left lower field. She has wheezes in the right lower field and the left lower field. She has no rhonchi. She has no  rales.  Musculoskeletal: Normal range of motion.  Neurological: She is alert and oriented to person, place, and time.  Skin: Skin is warm, dry and intact. No rash noted. No erythema. No pallor.  Psychiatric: She has a normal mood and affect. Her speech is normal and  behavior is normal. Judgment and thought content normal. Cognition and memory are normal.    Results for orders placed or performed during the hospital encounter of 02/01/15  Basic metabolic panel  Result Value Ref Range   Sodium 141 135 - 145 mmol/L   Potassium 3.5 3.5 - 5.1 mmol/L   Chloride 106 101 - 111 mmol/L   CO2 27 22 - 32 mmol/L   Glucose, Bld 128 (H) 65 - 99 mg/dL   BUN 13 6 - 20 mg/dL   Creatinine, Ser 1.61 (H) 0.44 - 1.00 mg/dL   Calcium 9.1 8.9 - 09.6 mg/dL   GFR calc non Af Amer 58 (L) >60 mL/min   GFR calc Af Amer >60 >60 mL/min   Anion gap 8 5 - 15  CBC  Result Value Ref Range   WBC 9.6 3.6 - 11.0 K/uL   RBC 4.65 3.80 - 5.20 MIL/uL   Hemoglobin 13.8 12.0 - 16.0 g/dL   HCT 04.5 40.9 - 81.1 %   MCV 90.7 80.0 - 100.0 fL   MCH 29.6 26.0 - 34.0 pg   MCHC 32.6 32.0 - 36.0 g/dL   RDW 91.4 78.2 - 95.6 %   Platelets 220 150 - 440 K/uL  Troponin I  Result Value Ref Range   Troponin I <0.03 <0.031 ng/mL  Troponin I  Result Value Ref Range   Troponin I 0.03 <0.031 ng/mL      Assessment & Plan:   Problem List Items Addressed This Visit    None    Visit Diagnoses    Acute bronchitis, unspecified organism    -  Primary    Wheezing better following nebulizer. Will treat with prednisone and z-pack. Tussionex and tessalon perles for comfort. Continue to monitor. Recheck lungs 2 week    Relevant Medications    albuterol (PROVENTIL) (2.5 MG/3ML) 0.083% nebulizer solution 2.5 mg    Fever, unspecified fever cause        Flu negative.     Relevant Orders    Influenza A+B Ag, EIA    Body aches        Flu negative.     Relevant Orders    Influenza A+B Ag, EIA        Follow up plan: Return in about 2 weeks (around 02/23/2015) for Lung recheck.

## 2015-02-09 NOTE — Patient Instructions (Signed)

## 2015-02-09 NOTE — Telephone Encounter (Signed)
Routing to provider  

## 2015-02-10 NOTE — Telephone Encounter (Signed)
Rx's approved.

## 2015-02-23 ENCOUNTER — Ambulatory Visit (INDEPENDENT_AMBULATORY_CARE_PROVIDER_SITE_OTHER): Payer: 59 | Admitting: Family Medicine

## 2015-02-23 ENCOUNTER — Encounter: Payer: Self-pay | Admitting: Family Medicine

## 2015-02-23 VITALS — BP 121/83 | HR 82 | Temp 98.3°F | Ht 64.0 in | Wt 194.0 lb

## 2015-02-23 DIAGNOSIS — J209 Acute bronchitis, unspecified: Secondary | ICD-10-CM

## 2015-02-23 MED ORDER — BENZONATATE 200 MG PO CAPS
200.0000 mg | ORAL_CAPSULE | Freq: Three times a day (TID) | ORAL | Status: DC | PRN
Start: 2015-02-23 — End: 2015-12-08

## 2015-02-23 NOTE — Progress Notes (Signed)
BP 121/83 mmHg  Pulse 82  Temp(Src) 98.3 F (36.8 C)  Ht 5\' 4"  (1.626 m)  Wt 194 lb (87.998 kg)  BMI 33.28 kg/m2  SpO2 98%   Subjective:    [name], female    DOB: 04/08/1956, 59 y.o.   MRN: [id number]  HPI: [name] is a 59 y.o. female  Chief Complaint  Patient presents with  . Lung Recheck   Feeling significantly better after her antibiotics and her prednisone. Feels like she is breathing better again. Has continued with a cough, especially when she lays down at night. She did not do well with the codiene cough syrup, but the tessalon perles worked well for her. She has had no fevers, no chills. She is still feeling very tired. No other concerns or complaints at this time.   Relevant past medical, surgical, family and social history reviewed and updated as indicated. Interim medical history since our last visit reviewed. Allergies and medications reviewed and updated.  Review of Systems  Constitutional: Negative.   HENT: Negative.   Respiratory: Positive for cough. Negative for apnea, choking, chest tightness, shortness of breath, wheezing and stridor.   Cardiovascular: Negative.   Psychiatric/Behavioral: Negative.     Per HPI unless specifically indicated above     Objective:    BP 121/83 mmHg  Pulse 82  Temp(Src) 98.3 F (36.8 C)  Ht 5\' 4"  (1.626 m)  Wt 194 lb (87.998 kg)  BMI 33.28 kg/m2  SpO2 98%  Wt Readings from Last 3 Encounters:  02/23/15 194 lb (87.998 kg)  02/09/15 193 lb (87.544 kg)  02/03/15 195 lb (88.451 kg)    Physical Exam  Constitutional: She is oriented to person, place, and time. She appears well-developed and well-nourished. No distress.  HENT:  Head: Normocephalic and atraumatic.  Right Ear: Hearing normal.  Left Ear: Hearing normal.  Nose: Nose normal.  Eyes: Conjunctivae and lids are normal. Right eye exhibits no discharge. Left eye exhibits no discharge. No scleral icterus.  Cardiovascular: Normal rate,  regular rhythm, normal heart sounds and intact distal pulses.  Exam reveals no gallop and no friction rub.   No murmur heard. Pulmonary/Chest: Effort normal and breath sounds normal. No respiratory distress. She has no wheezes. She has no rales. She exhibits no tenderness.  Musculoskeletal: Normal range of motion.  Neurological: She is alert and oriented to person, place, and time.  Skin: Skin is warm, dry and intact. No rash noted. She is not diaphoretic. No erythema. No pallor.  Psychiatric: She has a normal mood and affect. Her speech is normal and behavior is normal. Judgment and thought content normal. Cognition and memory are normal.    Results for orders placed or performed in visit on 02/09/15  Influenza A+B Ag, EIA  Result Value Ref Range   Influenza A Ag, EIA Negative Negative   Influenza B Ag, EIA Negative Negative   Influenza Comment See note   Please note:  Result Value Ref Range   Please note: Comment       Assessment & Plan:   Problem List Items Addressed This Visit    None    Visit Diagnoses    Acute bronchitis, unspecified organism    -  Primary    Resolved. Lungs clear. Continues with cough. Refill of tessalon perles given today. Call if not getting better or getting worse.         Follow up plan: Return if symptoms worsen or fail to  improve.

## 2015-04-27 ENCOUNTER — Ambulatory Visit: Payer: No Typology Code available for payment source | Attending: Family Medicine | Admitting: Physical Therapy

## 2015-04-27 ENCOUNTER — Ambulatory Visit: Payer: 59 | Admitting: Physical Therapy

## 2015-04-27 DIAGNOSIS — M542 Cervicalgia: Secondary | ICD-10-CM | POA: Insufficient documentation

## 2015-04-27 DIAGNOSIS — M545 Low back pain, unspecified: Secondary | ICD-10-CM

## 2015-04-27 DIAGNOSIS — M6281 Muscle weakness (generalized): Secondary | ICD-10-CM | POA: Diagnosis present

## 2015-04-28 ENCOUNTER — Encounter: Payer: Self-pay | Admitting: Physical Therapy

## 2015-04-28 NOTE — Therapy (Signed)
[facility] [address] [place], [place], [address] Phone: [phone]   Fax:  [phone]  Physical Therapy Evaluation  Patient Details  Name: [name] MRN: [id number] Date of Birth: 1956-04-27 Referring Provider: Olevia Perches, D.O.  Encounter Date: 04/27/2015      PT End of Session - 04/28/15 1431    PT Start Time 1007   PT Stop Time 1152   PT Time Calculation (min) 105 min   Activity Tolerance Patient tolerated treatment well;Patient limited by fatigue;Patient limited by pain   Behavior During Therapy [facility] for tasks assessed/performed      Past Medical History  Diagnosis Date  . Other and unspecified ovarian cyst     left  . Other chest pain   . COPD (chronic obstructive pulmonary disease) (HCC)   . Tobacco use disorder   . Anxiety state, unspecified   . Unspecified sleep apnea     resolved s/p UPPP surgery  . Other and unspecified hyperlipidemia   . PAF (paroxysmal atrial fibrillation) (HCC)     a. 07/2007 Echo: EF 65%, mildly dil LA;  b. currently on propafenone;  c. CHA2DS2VASc = 1 (gender)-->No anticoagulation.  Marland Kitchen PTSD (post-traumatic stress disorder)   . Depression   . GERD (gastroesophageal reflux disease)   . Nontoxic multinodular goiter   . Unspecified hypothyroidism   . Migraines   . Tobacco use   . Vitamin B12 deficiency   . CTS (carpal tunnel syndrome)   . Neck pain   . Back pain   . Chest pain     a. 2008 Cath: reportedly nl;  b. 09/2012 Lexi MV: EF 75%, soft tissue attenuation->Low risk.    Past Surgical History  Procedure Laterality Date  . Cardiac catheterization  08/12/06    neg. Dr. Welton Flakes, cardio  . Stress cardiolite  10/06/96    nl, EF 62%  . Stress myoview  10/25/03    Dr. Park Breed, nl  . Cholecystectomy    . Nasal polyp surgery      nasal ?  . Thyroidectomy  5/92    massive goiter  . Uvulopalatopharyngoplasty      and tonsilectomy. [name] 10/04  . Mri lumber spine     03/12/06-abn  . Mri pelvis  05/02/06    abn-[facility], neurosurg  . Parathyroidectomy    . Bladder tac    . Foot surgery      bone removal from her foot  . Tubal ligation  1988  . Tonsillectomy      There were no vitals filed for this visit.  Visit Diagnosis:  Neck pain  Muscle weakness  Right-sided low back pain without sciatica      Subjective Assessment - 04/28/15 1428    Subjective Pt. reports 6/10 R sided back pain.  See [facility] report   Currently in Pain? Yes   Pain Score 6    Pain Location Back   Pain Orientation Right;Mid;Lower            [facility] PT Assessment - 04/28/15 0001    Assessment   Medical Diagnosis Neck and back pain/ Scoliosis   Referring Provider Olevia Perches, D.O.   Onset Date/Surgical Date 04/28/14   Hand Dominance Right   Prior Therapy at [facility] (d/c 10/16)       See [facility] report        Plan - 04/28/15 1431    Clinical Impression Statement See [facility] report   Pt will benefit from  skilled therapeutic intervention in order to improve on the following deficits Decreased mobility;Decreased endurance;Pain;Decreased strength;Improper body mechanics   Rehab Potential Fair   PT Frequency 1x / week   PT Treatment/Interventions Therapeutic exercise;Therapeutic activities;Neuromuscular re-education;Patient/family education;Functional mobility training   PT Next Visit Plan [facility] only         Problem List Patient Active Problem List   Diagnosis Date Noted  . Chronic fatigue 02/06/2015  . CKD (chronic kidney disease) stage 3, GFR 30-59 ml/min 02/06/2015  . Nasal polyp, posterior 02/03/2015  . Preventative health care 02/03/2015  . Muscle spasms of head and/or neck 11/11/2014  . Neck pain of over 3 months duration 10/21/2014  . Somatic dysfunction of head region 10/21/2014  . Depression   . GERD (gastroesophageal reflux disease)   . Hypothyroidism   . Migraines   . Tobacco use   . Depression, major, recurrent, moderate (HCC) 07/20/2014    Class:  Chronic  . Atrial fibrillation (HCC) 08/29/2012  . Dyspnea on exertion 08/29/2012  . DEGENERATIVE JOINT DISEASE 02/25/2008  . ANXIETY DEPRESSION 09/03/2007  . Hyperlipidemia 01/29/2007  . COPD 01/29/2007  . ANXIETY 01/28/2007  . OVARIAN CYST, LEFT 05/14/2006   Cammie McgeeMichael C Toba Claudio, PT, DPT # [id number]   04/28/2015, 2:35 PM  [facility] [address] [place], [place], [address] Phone: [phone]   Fax:  [phone]  Name: [name] MRN: [id number] Date of Birth: 05/27/1955

## 2015-05-03 ENCOUNTER — Telehealth: Payer: Self-pay
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
## 2015-05-03 NOTE — Telephone Encounter (Signed)
If she has the paperwork, she should probably come in so we can discuss it and develop a further plan. Thanks!

## 2015-05-10 ENCOUNTER — Encounter: Payer: Self-pay | Admitting: Family Medicine

## 2015-05-10 ENCOUNTER — Ambulatory Visit: Payer: Self-pay | Admitting: Family Medicine

## 2015-05-10 ENCOUNTER — Ambulatory Visit (INDEPENDENT_AMBULATORY_CARE_PROVIDER_SITE_OTHER): Payer: Self-pay | Admitting: Family Medicine

## 2015-05-10 VITALS — BP 133/92 | HR 78 | Temp 99.0°F | Ht 63.3 in | Wt 201.0 lb

## 2015-05-10 DIAGNOSIS — S030XXA Dislocation of jaw, initial encounter: Secondary | ICD-10-CM

## 2015-05-10 DIAGNOSIS — E038 Other specified hypothyroidism: Secondary | ICD-10-CM

## 2015-05-10 DIAGNOSIS — F331 Major depressive disorder, recurrent, moderate: Secondary | ICD-10-CM

## 2015-05-10 DIAGNOSIS — S0300XA Dislocation of jaw, unspecified side, initial encounter: Secondary | ICD-10-CM

## 2015-05-10 MED ORDER — BUPROPION HCL ER (XL) 150 MG PO TB24: 150.0000 mg | ORAL_TABLET | Freq: Every day | ORAL | Status: DC

## 2015-05-10 MED ORDER — LEVOTHYROXINE SODIUM 150 MCG PO TABS: 150.0000 ug | ORAL_TABLET | Freq: Every day | ORAL | Status: DC

## 2015-05-10 MED ORDER — OMEPRAZOLE 20 MG PO CPDR: DELAYED_RELEASE_CAPSULE | ORAL | Status: DC

## 2015-05-10 MED ORDER — CYCLOBENZAPRINE HCL 10 MG PO TABS: 10.0000 mg | ORAL_TABLET | Freq: Three times a day (TID) | ORAL | Status: DC | PRN

## 2015-05-10 MED ORDER — BUSPIRONE HCL 15 MG PO TABS: 15.0000 mg | ORAL_TABLET | Freq: Two times a day (BID) | ORAL | Status: DC

## 2015-05-10 NOTE — Assessment & Plan Note (Signed)
Stable at last check. Continue current regimen. Continue to monitor.

## 2015-05-10 NOTE — Progress Notes (Signed)
BP 133/92 mmHg  Pulse 78  Temp(Src) 99 F (37.2 C)  Ht 5' 3.3" (1.608 m)  Wt 201 lb (91.173 kg)  BMI 35.26 kg/m2  SpO2 96%   Subjective:    [name], female    DOB: 02/01/1956, [age] y.o.   MRN: [id number]  HPI: [name] Blarelthea S Siddique is a 60 y.o. female  Chief Complaint  Patient presents with  . refills    Patient would like to know if she can have the medications that are on the 4 dollar list  . Headache    Patient has head pain   DEPRESSION- hasn't been able to find a job. Very stressed. Very lonely. Feeling like she is rejected all the time. Mood status: stable Satisfied with current treatment?: yes Symptom severity: moderate  Duration of current treatment : chronic Side effects: no Medication compliance: excellent compliance Psychotherapy/counseling: no  Depressed mood: yes Anxious mood: yes Anhedonia: no Significant weight loss or gain: no Insomnia: no hard to stay asleep Fatigue: no Feelings of worthlessness or guilt: no Impaired concentration/indecisiveness: no Suicidal ideations: no Hopelessness: no Crying spells: no Depression screen PHQ 2/9 02/03/2015  Decreased Interest 2  Down, Depressed, Hopeless 2  PHQ - 2 Score 4  Altered sleeping 2  Tired, decreased energy 3  Change in appetite 2  Feeling bad or failure about yourself  0  Trouble concentrating 1  Moving slowly or fidgety/restless 1  Suicidal thoughts 0  PHQ-9 Score 13    HYPOTHYROIDISM Thyroid control status:controlled Satisfied with current treatment? yes Medication side effects: no Medication compliance: excellent compliance Recent dose adjustment:no Fatigue: no Cold intolerance: no Heat intolerance: no Weight gain: no Weight loss: no Constipation: no Diarrhea/loose stools: no Palpitations: no Lower extremity edema: no Anxiety/depressed mood: yes  Jaw has been clicking and hurting and causing her headaches. Has been going on for about 2 weeks. Under a lot of stress. Her  daughter was weird on the phone. Cat is dying. Needs to find a job and is not able to. Pain radiates into her head. Severe and stabbing when she wakes up. No other signs and symptoms. Better with heat, worse with stress.   Relevant past medical, surgical, family and social history reviewed and updated as indicated. Interim medical history since our last visit reviewed. Allergies and medications reviewed and updated.  Review of Systems  Constitutional: Negative.   Respiratory: Negative.   Cardiovascular: Negative.   Musculoskeletal: Positive for myalgias, back pain, neck pain and neck stiffness. Negative for joint swelling, arthralgias and gait problem.  Psychiatric/Behavioral: Negative.     Per HPI unless specifically indicated above     Objective:    BP 133/92 mmHg  Pulse 78  Temp(Src) 99 F (37.2 C)  Ht 5' 3.3" (1.608 m)  Wt 201 lb (91.173 kg)  BMI 35.26 kg/m2  SpO2 96%  Wt Readings from Last 3 Encounters:  05/10/15 201 lb (91.173 kg)  02/23/15 194 lb (87.998 kg)  02/09/15 193 lb (87.544 kg)    Physical Exam  Constitutional: She is oriented to person, place, and time. She appears well-developed and well-nourished. No distress.  HENT:  Head: Normocephalic and atraumatic.  Right Ear: Hearing normal.  Left Ear: Hearing normal.  Nose: Nose normal.  Clicking of R jaw with movement and deviation   Eyes: Conjunctivae and lids are normal. Right eye exhibits no discharge. Left eye exhibits no discharge. No scleral icterus.  Cardiovascular: Normal rate, regular rhythm, normal heart sounds and intact  distal pulses.  Exam reveals no gallop and no friction rub.   Pulmonary/Chest: Effort normal and breath sounds normal. No respiratory distress. She has no wheezes. She has no rales. She exhibits no tenderness.  Musculoskeletal: Normal range of motion.  Neurological: She is alert and oriented to person, place, and time.  Skin: Skin is warm, dry and intact. No rash noted. No erythema. No  pallor.  Psychiatric: She has a normal mood and affect. Her speech is normal and behavior is normal. Judgment and thought content normal. Cognition and memory are normal.  Nursing note and vitals reviewed.   Results for orders placed or performed in visit on 02/09/15  Influenza A+B Ag, EIA  Result Value Ref Range   Influenza A Ag, EIA Negative Negative   Influenza B Ag, EIA Negative Negative   Influenza Comment See note   Please note:  Result Value Ref Range   Please note: Comment       Assessment & Plan:   Problem List Items Addressed This Visit      Endocrine   Hypothyroidism    Stable at last check. Continue current regimen. Continue to monitor.       Relevant Medications   levothyroxine (SYNTHROID, LEVOTHROID) 150 MCG tablet     Other   Depression, major, recurrent, moderate (HCC) - Primary    Stable. Doesn't want to change medicine right now. Coupon for the wellbutrin given today. Continue buspar. Continue current regimen. Continue to monitor.       Relevant Medications   busPIRone (BUSPAR) 15 MG tablet   buPROPion (WELLBUTRIN XL) 150 MG 24 hr tablet    Other Visit Diagnoses    TMJ (dislocation of temporomandibular joint), initial encounter        Will treat with flexeril qHS. Call if not getting better or getting worse.         Follow up plan: Return in about 6 months (around 11/07/2015).

## 2015-05-10 NOTE — Assessment & Plan Note (Signed)
Stable. Doesn't want to change medicine right now. Coupon for the wellbutrin given today. Continue buspar. Continue current regimen. Continue to monitor.

## 2015-06-14 ENCOUNTER — Encounter: Payer: Self-pay | Admitting: Family Medicine

## 2015-08-01 ENCOUNTER — Telehealth: Payer: Self-pay | Admitting: Family Medicine

## 2015-08-01 DIAGNOSIS — M419 Scoliosis, unspecified: Secondary | ICD-10-CM

## 2015-08-01 DIAGNOSIS — E039 Hypothyroidism, unspecified: Secondary | ICD-10-CM

## 2015-08-01 NOTE — Telephone Encounter (Signed)
Called and left a message to let patient know that Dr.Johnson will be back in tomorrow.

## 2015-08-01 NOTE — Telephone Encounter (Signed)
Pt called needs a call back from Dr. Laural BenesJohnson ASAP. Thanks.

## 2015-08-02 DIAGNOSIS — M419 Scoliosis, unspecified: Secondary | ICD-10-CM | POA: Insufficient documentation

## 2015-08-02 NOTE — Telephone Encounter (Signed)
Called to check in with patient. She had to put her cat down, and she has been really upset. She has not been doing well. Still has not found a job. Having a lot of financial issues. Concerned about her thyroid, would like to have her labs checked again. Also notes that she wants to see someone for her back with her scoliosis. Would like to have a brace- will get her a referral set up today.

## 2015-08-02 NOTE — Telephone Encounter (Signed)
Forward to provider

## 2015-08-05 ENCOUNTER — Telehealth: Payer: Self-pay

## 2015-08-05 NOTE — Telephone Encounter (Signed)
Yesterday I called Peggy at [facility] about this patient because she sent us a fax suggesting we just fill out a rx for a back brace. Dr. Laural BenesJohnson asked me to call Peggy and ask if they knew anybody that could do a scolioses evaluation. I left this information on Peggy's voicemail and she called and left me a voicemail after I left yesterday at lunch. Peggy stated in the voicemail that they could to a scolioses evaluation so she said she would call and try to get the patient an appointment. She stated she called the patient and the patient did not want to schedule an appointment because she did not have insurance and she had no way to pay to see them.

## 2015-08-05 NOTE — Telephone Encounter (Signed)
OK to cancel referral for now. She will let us know if she wants to see them.

## 2015-10-20 ENCOUNTER — Other Ambulatory Visit: Payer: Self-pay | Admitting: Nurse Practitioner

## 2015-10-21 ENCOUNTER — Other Ambulatory Visit: Payer: Self-pay

## 2015-10-21 ENCOUNTER — Other Ambulatory Visit: Payer: Self-pay | Admitting: *Deleted

## 2015-10-21 MED ORDER — PROPAFENONE HCL 225 MG PO TABS: 225.0000 mg | ORAL_TABLET | Freq: Two times a day (BID) | ORAL | Status: DC

## 2015-12-01 ENCOUNTER — Other Ambulatory Visit: Payer: Self-pay | Admitting: Family Medicine

## 2015-12-01 ENCOUNTER — Other Ambulatory Visit: Payer: Self-pay | Admitting: Nurse Practitioner

## 2015-12-01 NOTE — Telephone Encounter (Signed)
rx

## 2015-12-08 ENCOUNTER — Ambulatory Visit (INDEPENDENT_AMBULATORY_CARE_PROVIDER_SITE_OTHER): Payer: Self-pay | Admitting: Internal Medicine

## 2015-12-08 ENCOUNTER — Ambulatory Visit: Payer: Self-pay | Admitting: Internal Medicine

## 2015-12-08 ENCOUNTER — Encounter (INDEPENDENT_AMBULATORY_CARE_PROVIDER_SITE_OTHER): Payer: Self-pay

## 2015-12-08 ENCOUNTER — Encounter: Payer: Self-pay | Admitting: Internal Medicine

## 2015-12-08 VITALS — BP 132/93 | Ht 65.0 in | Wt 203.4 lb

## 2015-12-08 DIAGNOSIS — I480 Paroxysmal atrial fibrillation: Secondary | ICD-10-CM

## 2015-12-08 LAB — TSH: TSH: 1.02 m[IU]/L

## 2015-12-08 MED ORDER — PROPAFENONE HCL 225 MG PO TABS: ORAL_TABLET | ORAL | Status: DC

## 2015-12-08 NOTE — Patient Instructions (Addendum)
Medication Instructions: - Your physician has recommended you make the following change in your medication:  1) Stop rhythmol (propafenone) daily dosing 2) Start rhythmol (propafenone) 225 mg -take two tablets (450 mg) by mouth once daily as needed for breakthrough of a-fib  Labwork: - Your physician recommends that you have lab work today: TSH  Procedures/Testing: - none  Follow-Up: - Your physician wants you to follow-up in: 6 months with Dr. Graciela Husbands in the [place] office. You will receive a reminder letter in the mail two months in advance. If you don't receive a letter, please call our office to schedule the follow-up appointment.  Any Additional Special Instructions Will Be Listed Below (If Applicable).     If you need a refill on your cardiac medications before your next appointment, please call your pharmacy.

## 2015-12-08 NOTE — Progress Notes (Signed)
ELECTROPHYSIOLOGY CONSULT NOTE  [name], MRN: [id number], DOB/AGE: March 26, 1956 60 y.o. Admit date: (Not on file) Date of Consult: 12/08/2015  Primary Physician: Vonita Moss, MD Primary Cardiologist: new Consulting Physician CBerge  Chief Complaint:  Atrial fibrillation    HPI [name] is a 60 y.o. 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 She had been treated with AV nodal blocking agents at that time and finally started on propafenone which she tolerated apparently well with infrequent occurrences. Her CHADS-VASc score was 1-gender and she has not been anticoagulated.  She has had infrequent atrial fibrillation.  Because of major social issues, she's been taking her propafenone once a day.  Her thyroid dose was decreased urine half ago; it has not been rechecked.  The patient denies chest pain, shortness of breath, nocturnal dyspnea, orthopnea or peripheral edema.  There have been no palpitations, lightheadedness or syncope. She does have significant fatigue and change in hair texture      Past Medical History:  Diagnosis Date  . Anxiety state, unspecified   . Back pain   . Chest pain    a. 2008 Cath: reportedly nl;  b. 09/2012 Lexi MV: EF 75%, soft tissue attenuation->Low risk.  Marland Kitchen COPD (chronic obstructive pulmonary disease) (HCC)   . CTS (carpal tunnel syndrome)   . Depression   . GERD (gastroesophageal reflux disease)   . Migraines   . Neck pain   . Nontoxic multinodular goiter   . Other and unspecified hyperlipidemia   . Other and unspecified ovarian cyst    left  . Other chest pain   . PAF (paroxysmal atrial fibrillation) (HCC)    a. 07/2007 Echo: EF 65%, mildly dil LA;  b. currently on propafenone;  c. CHA2DS2VASc = 1 (gender)-->No anticoagulation.  Marland Kitchen PTSD (post-traumatic stress disorder)   . Tobacco use   . Tobacco use disorder   . Unspecified hypothyroidism   . Unspecified sleep apnea    resolved s/p UPPP surgery  . Vitamin B12 deficiency       Surgical History:  Past Surgical History:  Procedure Laterality Date  . bladder tac    . CARDIAC CATHETERIZATION  08/12/06   neg. Dr. Welton Flakes, cardio  . CHOLECYSTECTOMY    . FOOT SURGERY     bone removal from her foot  . MRI lumber spine     03/12/06-abn  . MRI pelvis  05/02/06   abn-Bortero, neurosurg  . NASAL POLYP SURGERY     nasal ?  . PARATHYROIDECTOMY    . stress cardiolite  10/06/96   nl, EF 62%  . stress myoview  10/25/03   Dr. Park Breed, nl  . THYROIDECTOMY  5/92   massive goiter  . TONSILLECTOMY    . TUBAL LIGATION  1988  . UVULOPALATOPHARYNGOPLASTY     and tonsilectomy. [name] 10/04     Home Meds: Prior to Admission medications   Medication Sig Start Date End Date Taking? Authorizing Provider  busPIRone (BUSPAR) 15 MG tablet Take 1 tablet (15 mg total) by mouth 2 (two) times daily. 05/10/15  Yes Megan P Johnson, DO  levothyroxine (SYNTHROID, LEVOTHROID) 125 MCG tablet Take 125 mcg by mouth daily before breakfast.   Yes Historical Provider, MD  omeprazole (PRILOSEC) 20 MG capsule TAKE ONE CAPSULE BY MOUTH ONCE DAILY AS NEEDED. CAUTION PROLONGED USE INCREASES RISK OF PNEUMONIA, COLITIS, OSTEOPOROSIS, ANEMIA 12/01/15  Yes Megan Holly Bodily, DO  propafenone (RYTHMOL) 225 MG tablet Take 225 mg by mouth daily.   Yes Historical Provider, MD  WELLBUTRIN XL 150 MG 24 hr tablet TAKE ONE TABLET BY MOUTH ONCE DAILY 12/01/15  Yes Megan Holly Bodily, DO    Allergies:  Allergies  Allergen Reactions  . Bee Venom Anaphylaxis  . Penicillins Rash  . Sulfonamide Derivatives Rash  . Cardizem  [Diltiazem Hcl] Other (See Comments)    Other Reaction: severe hypotension w/ IV dose    Social History   Social History  . Marital status: Widowed    Spouse name: N/A  . Number of children: N/A  . Years of education: N/A   Occupational History  . Not on file.   Social History Main Topics  . Smoking status: Current Every Day Smoker      Packs/day: 0.50    Years: 20.00    Types: Cigarettes  . Smokeless tobacco: Never Used  . Alcohol use 0.0 oz/week     Comment: Rare  . Drug use: No  . Sexual activity: No   Other Topics Concern  . Not on file   Social History Narrative   Widowed. 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      Family History  Problem Relation Age of Onset  . Stroke Father   . Heart disease Mother   . Atrial fibrillation Mother   . Congestive Heart Failure Mother   . Atrial fibrillation Sister     s/p ablation  . Hyperlipidemia Daughter   . Heart disease Daughter     leaky valve  . Atrial fibrillation Other   . Heart attack      both sides  . Stroke      both sides  . Depression      both sides  . Prostate cancer Neg Hx   . Colon cancer Neg Hx      ROS:  Please see the history of present illness.     All other systems reviewed and negative.    Physical Exam: Blood pressure (!) 132/93, height 5\' 5"  (1.651 m), weight 203 lb 6.4 oz (92.3 kg), SpO2 98 %. General: Well developed, well nourished female in no acute distress. Head: Normocephalic, atraumatic, sclera non-icteric, no xanthomas, nares are without discharge. EENT: normal  Lymph Nodes:  none Neck: Negative for carotid bruits. JVD not elevated. Back:without scoliosis kyphosis Lungs: Clear bilaterally to auscultation without wheezes, rales, or rhonchi. Breathing is unlabored. Heart: RRR with S1 S2. No murmur . No rubs, or gallops appreciated. Abdomen: Soft, non-tender, non-distended with normoactive bowel sounds. No hepatomegaly. No rebound/guarding. No obvious abdominal masses. Msk:  Strength and tone appear normal for age. Extremities: No clubbing or cyanosis. No edema.  Distal pedal pulses are 2+ and equal bilaterally. Skin: Warm and Dry Neuro: Alert and oriented X 3. CN III-XII intact Grossly normal sensory and motor function . Psych:  Responds to questions appropriately with a normal affect.       Labs: Cardiac Enzymes No results for input(s): CKTOTAL, CKMB, TROPONINI in the last 72 hours. CBC Lab Results  Component Value Date   WBC 9.6 02/01/2015   HGB 13.8 02/01/2015   HCT 42.2 02/01/2015   MCV 90.7 02/01/2015   PLT 220 02/01/2015   PROTIME: No results for input(s): LABPROT, INR in the last 72 hours. Chemistry No results for input(s): NA, K, CL, CO2, BUN, CREATININE, CALCIUM, PROT, BILITOT, ALKPHOS, ALT, AST, GLUCOSE in the last 168 hours.  Invalid input(s): LABALBU  Lipids Lab Results  Component Value Date   CHOL  05/09/2008    171        ATP III CLASSIFICATION:  <200     mg/dL   Desirable  829-562  mg/dL   Borderline High  >=130    mg/dL   High          HDL 36 (L) 05/09/2008   LDLCALC (H) 05/09/2008    107        Total Cholesterol/HDL:CHD Risk Coronary Heart Disease Risk Table                     Men   Women  1/2 Average Risk   3.4   3.3  Average Risk       5.0   4.4  2 X Average Risk   9.6   7.1  3 X Average Risk  23.4   11.0        Use the calculated Patient Ratio above and the CHD Risk Table to determine the patient's CHD Risk.        ATP III CLASSIFICATION (LDL):  <100     mg/dL   Optimal  865-784  mg/dL   Near or Above                    Optimal  130-159  mg/dL   Borderline  696-295  mg/dL   High  >284     mg/dL   Very High   TRIG 132 05/09/2008   BNP No results found for: PROBNP Thyroid Function Tests: No results for input(s): TSH, T4TOTAL, T3FREE, THYROIDAB in the last 72 hours.  Invalid input(s): FREET3 Miscellaneous Lab Results  Component Value Date   DDIMER  07/30/2007    0.22        AT THE INHOUSE ESTABLISHED CUTOFF VALUE OF 0.48 ug/mL FEU, THIS ASSAY HAS BEEN DOCUMENTED IN THE LITERATURE TO HAVE    Radiology/Studies:  No results found.  GMW:NUUVO rhythm at 82 Intervals 15/10/41 Axis left -49  Assessment and plan  Atrial fibrillation-paroxysmal  Elevated blood pressure  Treated hypothyroidism  We will plan to  treat her atrial fibrillation using a propafenone cocktail at 450 mg as needed. We will try to assess frequency over the next number of months before we decide to put her back on long-term therapy. Her CHADS-VASc score is sufficiently low that anticoagulation is not indicated.  Her blood pressure while elevated today will need to be corroborated prior to inrforming any medical decision-making   we will reassess her TSH       Sherryl Manges

## 2015-12-09 ENCOUNTER — Telehealth: Payer: Self-pay | Admitting: Internal Medicine

## 2015-12-09 NOTE — Telephone Encounter (Signed)
New message      *STAT* If patient is at the pharmacy, call can be transferred to refill team.   1. Which medications need to be refilled? (please list name of each medication and dose if known) Propafenone 225 mg table as needed  2. Which pharmacy/location (including street and city if local pharmacy) is medication to be sent to?[facility] [address] in [place]  3. Do they need a 30 day or 90 day supply? Pt uncertain

## 2015-12-13 ENCOUNTER — Other Ambulatory Visit: Payer: Self-pay | Admitting: *Deleted

## 2015-12-13 MED ORDER — PROPAFENONE HCL 225 MG PO TABS: ORAL_TABLET | ORAL | 11 refills | Status: DC

## 2016-01-02 ENCOUNTER — Ambulatory Visit (INDEPENDENT_AMBULATORY_CARE_PROVIDER_SITE_OTHER): Payer: Self-pay | Admitting: Family Medicine

## 2016-01-02 ENCOUNTER — Encounter: Payer: Self-pay | Admitting: Family Medicine

## 2016-01-02 VITALS — BP 134/84 | HR 77 | Temp 98.5°F | Wt 202.0 lb

## 2016-01-02 DIAGNOSIS — E538 Deficiency of other specified B group vitamins: Secondary | ICD-10-CM

## 2016-01-02 DIAGNOSIS — E039 Hypothyroidism, unspecified: Secondary | ICD-10-CM

## 2016-01-02 DIAGNOSIS — R600 Localized edema: Secondary | ICD-10-CM

## 2016-01-02 NOTE — Assessment & Plan Note (Signed)
Will recheck levels. Await results. Adjust if needed.

## 2016-01-02 NOTE — Progress Notes (Signed)
BP 134/84 (BP Location: Left Arm, Patient Position: Sitting, Cuff Size: Normal)   Pulse 77   Temp 98.5 F (36.9 C)   Wt 202 lb (91.6 kg)   SpO2 97%   BMI 33.61 kg/m    Subjective:    [name], female    DOB: 01/21/1956, 60 y.o.   MRN: [id number]  HPI: [name] is a 60 y.o. female  Chief Complaint  Patient presents with  . Leg Swelling   Leg has been swelling for 3+ weeks- L>>R. Better this morning. Swells every day. Worse in the PM when she has been on it all day. Better in the AM most of the time. + redness, + pain in the top of her foot. Working on concrete floors and on her feet all day. Went to the cardiologist at the beginning of the month.  Taken off her propafenone around that time. No fevers. No chills.   Relevant past medical, surgical, family and social history reviewed and updated as indicated. Interim medical history since our last visit reviewed. Allergies and medications reviewed and updated.  Review of Systems  Per HPI unless specifically indicated above     Objective:    BP 134/84 (BP Location: Left Arm, Patient Position: Sitting, Cuff Size: Normal)   Pulse 77   Temp 98.5 F (36.9 C)   Wt 202 lb (91.6 kg)   SpO2 97%   BMI 33.61 kg/m   Wt Readings from Last 3 Encounters:  01/02/16 202 lb (91.6 kg)  12/08/15 203 lb 6.4 oz (92.3 kg)  05/10/15 201 lb (91.2 kg)    Physical Exam  Constitutional: She is oriented to person, place, and time. She appears well-developed and well-nourished. No distress.  HENT:  Head: Normocephalic and atraumatic.  Right Ear: Hearing normal.  Left Ear: Hearing normal.  Nose: Nose normal.  Eyes: Conjunctivae and lids are normal. Right eye exhibits no discharge. Left eye exhibits no discharge. No scleral icterus.  Cardiovascular: Normal rate, regular rhythm, normal heart sounds and intact distal pulses.  Exam reveals no gallop and no friction rub.   No murmur heard. Pulmonary/Chest: Effort normal and  breath sounds normal. No respiratory distress. She has no wheezes. She has no rales. She exhibits no tenderness.  Musculoskeletal: Normal range of motion. She exhibits edema (1+ edema bilaterally, negative homan's, negative squeeze).  Neurological: She is alert and oriented to person, place, and time.  Skin: Skin is warm, dry and intact. No rash noted. No erythema. No pallor.  Psychiatric: She has a normal mood and affect. Her speech is normal and behavior is normal. Judgment and thought content normal. Cognition and memory are normal.  Nursing note and vitals reviewed.   Results for orders placed or performed in visit on 12/08/15  TSH  Result Value Ref Range   TSH 1.02 mIU/L      Assessment & Plan:   Problem List Items Addressed This Visit      Endocrine   Hypothyroidism    Will recheck levels. Await results. Adjust if needed.        Other Visit Diagnoses    Localized edema    -  Primary   Likely due to venous insuffiency and standing. Will check labs. Will start compression stocking. Call with concerns.    Relevant Orders   CBC with Differential/Platelet   Comprehensive metabolic panel   B Nat Peptide   B12 deficiency       Will recheck levels  and treat as needed.    Relevant Orders   B12 and Folate Panel       Follow up plan: Return if symptoms worsen or fail to improve.

## 2016-01-03 ENCOUNTER — Telehealth: Payer: Self-pay | Admitting: Family Medicine

## 2016-01-03 LAB — CBC WITH DIFFERENTIAL/PLATELET
Basophils Absolute: 0 10*3/uL (ref 0.0–0.2)
Basos: 0 %
EOS (ABSOLUTE): 0.1 10*3/uL (ref 0.0–0.4)
EOS: 1 %
HEMATOCRIT: 43.9 % (ref 34.0–46.6)
HEMOGLOBIN: 14.5 g/dL (ref 11.1–15.9)
IMMATURE GRANS (ABS): 0 10*3/uL (ref 0.0–0.1)
IMMATURE GRANULOCYTES: 0 %
LYMPHS: 31 %
Lymphocytes Absolute: 2.9 10*3/uL (ref 0.7–3.1)
MCH: 30.3 pg (ref 26.6–33.0)
MCHC: 33 g/dL (ref 31.5–35.7)
MCV: 92 fL (ref 79–97)
MONOCYTES: 5 %
Monocytes Absolute: 0.5 10*3/uL (ref 0.1–0.9)
NEUTROS PCT: 63 %
Neutrophils Absolute: 5.8 10*3/uL (ref 1.4–7.0)
Platelets: 291 10*3/uL (ref 150–379)
RBC: 4.79 x10E6/uL (ref 3.77–5.28)
RDW: 13.3 % (ref 12.3–15.4)
WBC: 9.3 10*3/uL (ref 3.4–10.8)

## 2016-01-03 LAB — COMPREHENSIVE METABOLIC PANEL
ALBUMIN: 4.2 g/dL (ref 3.5–5.5)
ALT: 15 IU/L (ref 0–32)
AST: 19 IU/L (ref 0–40)
Albumin/Globulin Ratio: 1.8 (ref 1.2–2.2)
Alkaline Phosphatase: 79 IU/L (ref 39–117)
BUN/Creatinine Ratio: 10 (ref 9–23)
BUN: 9 mg/dL (ref 6–24)
Bilirubin Total: 0.4 mg/dL (ref 0.0–1.2)
CALCIUM: 9.5 mg/dL (ref 8.7–10.2)
CO2: 24 mmol/L (ref 18–29)
CREATININE: 0.94 mg/dL (ref 0.57–1.00)
Chloride: 103 mmol/L (ref 96–106)
GFR calc Af Amer: 77 mL/min/{1.73_m2} (ref >59)
GFR, EST NON AFRICAN AMERICAN: 67 mL/min/{1.73_m2} (ref >59)
GLOBULIN, TOTAL: 2.4 g/dL (ref 1.5–4.5)
Glucose: 98 mg/dL (ref 65–99)
Potassium: 4.4 mmol/L (ref 3.5–5.2)
SODIUM: 143 mmol/L (ref 134–144)
TOTAL PROTEIN: 6.6 g/dL (ref 6.0–8.5)

## 2016-01-03 LAB — THYROID PANEL WITH TSH
FREE THYROXINE INDEX: 2.5 (ref 1.2–4.9)
T3 Uptake Ratio: 27 % (ref 24–39)
T4 TOTAL: 9.3 ug/dL (ref 4.5–12.0)
TSH: 0.922 u[IU]/mL (ref 0.450–4.500)

## 2016-01-03 LAB — B12 AND FOLATE PANEL
FOLATE: 4.8 ng/mL (ref >3.0)
Vitamin B-12: 491 pg/mL (ref 211–946)

## 2016-01-03 LAB — BRAIN NATRIURETIC PEPTIDE: BNP: 35.6 pg/mL (ref 0.0–100.0)

## 2016-01-03 NOTE — Telephone Encounter (Signed)
Patient called.

## 2016-01-03 NOTE — Telephone Encounter (Signed)
Please let [name] ChapelJo know that all her labs came back normal. Thanks!

## 2016-02-13 ENCOUNTER — Telehealth: Payer: Self-pay

## 2016-02-13 MED ORDER — BUPROPION HCL ER (XL) 150 MG PO TB24: 150.0000 mg | ORAL_TABLET | Freq: Every day | ORAL | 0 refills | Status: DC

## 2016-02-13 NOTE — Telephone Encounter (Signed)
Wellbutrin XL 150mg  1 tablet daily #90

## 2016-03-16 ENCOUNTER — Other Ambulatory Visit: Payer: Self-pay | Admitting: Family Medicine

## 2016-04-09 ENCOUNTER — Telehealth: Payer: Self-pay | Admitting: Family Medicine

## 2016-04-09 ENCOUNTER — Other Ambulatory Visit: Payer: Self-pay | Admitting: Family Medicine

## 2016-04-11 ENCOUNTER — Other Ambulatory Visit: Payer: Self-pay | Admitting: Family Medicine

## 2016-04-11 NOTE — Telephone Encounter (Signed)
Pt needs a refill on the following medications:  BuPROPion HCI 150 MG,  BusPIRone HCI 15MG , Levothyroxine Sodium (Tab) 125 MCG, Omeprazole 20MG .  Pt states that [facility] [address] [facility].

## 2016-04-11 NOTE — Telephone Encounter (Signed)
Routing to provider  

## 2016-04-11 NOTE — Telephone Encounter (Signed)
rx

## 2016-04-12 MED ORDER — LEVOTHYROXINE SODIUM 125 MCG PO TABS: 125.0000 ug | ORAL_TABLET | Freq: Every day | ORAL | 1 refills | Status: DC

## 2016-04-12 MED ORDER — BUSPIRONE HCL 15 MG PO TABS: 15.0000 mg | ORAL_TABLET | Freq: Two times a day (BID) | ORAL | 1 refills | Status: DC

## 2016-04-12 MED ORDER — BUPROPION HCL ER (XL) 150 MG PO TB24: 150.0000 mg | ORAL_TABLET | Freq: Every day | ORAL | 1 refills | Status: DC

## 2016-04-12 MED ORDER — OMEPRAZOLE 20 MG PO CPDR: DELAYED_RELEASE_CAPSULE | ORAL | 1 refills | Status: DC

## 2016-04-24 NOTE — Telephone Encounter (Signed)
Erroneous entry

## 2016-05-22 ENCOUNTER — Telehealth: Payer: No Typology Code available for payment source | Admitting: Nurse Practitioner

## 2016-05-22 ENCOUNTER — Ambulatory Visit: Payer: Self-pay | Admitting: Family Medicine

## 2016-05-22 ENCOUNTER — Other Ambulatory Visit: Payer: Self-pay | Admitting: Nurse Practitioner

## 2016-05-22 ENCOUNTER — Other Ambulatory Visit: Payer: Self-pay

## 2016-05-22 DIAGNOSIS — R05 Cough: Secondary | ICD-10-CM

## 2016-05-22 DIAGNOSIS — R059 Cough, unspecified: Secondary | ICD-10-CM

## 2016-05-22 MED ORDER — DOXYCYCLINE HYCLATE 100 MG PO TABS: 100.0000 mg | ORAL_TABLET | Freq: Two times a day (BID) | ORAL | 0 refills | Status: DC

## 2016-05-22 NOTE — Progress Notes (Signed)
We are sorry that you are not feeling well.  Here is how we plan to help!  Based on what you have shared with me it looks like you have upper respiratory tract inflammation that has resulted in a significant cough.  Inflammation and infection in the upper respiratory tract is commonly called bronchitis and has four common causes:  Allergies, Viral Infections, Acid Reflux and Bacterial Infections.  Allergies, viruses and acid reflux are treated by controlling symptoms or eliminating the cause. An example might be a cough caused by taking certain blood pressure medications. You stop the cough by changing the medication. Another example might be a cough caused by acid reflux. Controlling the reflux helps control the cough.  Based on your presentation I believe you most likely have A cough due to bacteria.  When patients have a fever and a productive cough with a change in color or increased sputum production, we are concerned about bacterial bronchitis.  If left untreated it can progress to pneumonia.  If your symptoms do not improve with your treatment plan it is important that you contact your provider.   I hve prescribed Doxycycline 100 mg twice a day for 7 days     In addition you may use A non-prescription cough medication called Mucinex DM: take 2 tablets every 12 hours.   USE OF BRONCHODILATOR ("RESCUE") INHALERS: There is a risk from using your bronchodilator too frequently.  The risk is that over-reliance on a medication which only relaxes the muscles surrounding the breathing tubes can reduce the effectiveness of medications prescribed to reduce swelling and congestion of the tubes themselves.  Although you feel brief relief from the bronchodilator inhaler, your asthma may actually be worsening with the tubes becoming more swollen and filled with mucus.  This can delay other crucial treatments, such as oral steroid medications. If you need to use a bronchodilator inhaler daily, several times per  day, you should discuss this with your provider.  There are probably better treatments that could be used to keep your asthma under control.     HOME CARE . Only take medications as instructed by your medical team. . Complete the entire course of an antibiotic. . Drink plenty of fluids and get plenty of rest. . Avoid close contacts especially the very young and the elderly . Cover your mouth if you cough or cough into your sleeve. . Always remember to wash your hands . A steam or ultrasonic humidifier can help congestion.   GET HELP RIGHT AWAY IF: . You develop worsening fever. . You become short of breath . You cough up blood. . Your symptoms persist after you have completed your treatment plan MAKE SURE YOU   Understand these instructions.  Will watch your condition.  Will get help right away if you are not doing well or get worse.  Your e-visit answers were reviewed by a board certified advanced clinical practitioner to complete your personal care plan.  Depending on the condition, your plan could have included both over the counter or prescription medications. If there is a problem please reply  once you have received a response from your provider. Your safety is important to us.  If you have drug allergies check your prescription carefully.    You can use MyChart to ask questions about today's visit, request a non-urgent call back, or ask for a work or school excuse for 24 hours related to this e-Visit. If it has been greater than 24 hours you will   need to follow up with your provider, or enter a new e-Visit to address those concerns. You will get an e-mail in the next two days asking about your experience.  I hope that your e-visit has been valuable and will speed your recovery. Thank you for using e-visits.   

## 2016-05-23 NOTE — Telephone Encounter (Signed)
Closing encounter abx was done during evisit

## 2016-07-10 ENCOUNTER — Other Ambulatory Visit: Payer: Self-pay | Admitting: Family Medicine

## 2016-07-10 DIAGNOSIS — E038 Other specified hypothyroidism: Secondary | ICD-10-CM

## 2016-07-10 MED ORDER — LEVOTHYROXINE SODIUM 125 MCG PO TABS: 125.0000 ug | ORAL_TABLET | Freq: Every day | ORAL | 1 refills | Status: DC

## 2016-07-10 NOTE — Telephone Encounter (Signed)
Your patient.  Thanks 

## 2016-07-11 ENCOUNTER — Other Ambulatory Visit: Payer: Self-pay | Admitting: Family Medicine

## 2016-07-11 NOTE — Telephone Encounter (Signed)
[facility] and buPROPion.    Thank you

## 2016-07-11 NOTE — Telephone Encounter (Signed)
Snythroid was sent in yesterday. Will send Wellbutrin over to Provider  Lab Results  Component Value Date   TSH 0.922 01/02/2016   T4TOTAL 9.3 01/02/2016   Last routine OV: 01/02/16 Next OV: none on file

## 2016-07-12 NOTE — Telephone Encounter (Signed)
Provider refused medication. Please have pt schedule f/u for medication refills.

## 2016-07-18 NOTE — Telephone Encounter (Signed)
Scheduled appt for patient 09/06/2016

## 2016-09-06 ENCOUNTER — Other Ambulatory Visit
Admission: RE | Admit: 2016-09-06 | Discharge: 2016-09-06 | Disposition: A | Discharge status: Home / Self Care | Payer: Self-pay | Source: Ambulatory Visit | Attending: Family Medicine | Admitting: Family Medicine

## 2016-09-06 ENCOUNTER — Ambulatory Visit (INDEPENDENT_AMBULATORY_CARE_PROVIDER_SITE_OTHER): Payer: Self-pay | Admitting: Family Medicine

## 2016-09-06 ENCOUNTER — Encounter: Payer: Self-pay | Admitting: Family Medicine

## 2016-09-06 ENCOUNTER — Other Ambulatory Visit: Payer: Self-pay | Admitting: Family Medicine

## 2016-09-06 ENCOUNTER — Ambulatory Visit: Payer: Self-pay | Admitting: Family Medicine

## 2016-09-06 VITALS — BP 114/74 | HR 75 | Wt 199.6 lb

## 2016-09-06 DIAGNOSIS — N183 Chronic kidney disease, stage 3 unspecified: Secondary | ICD-10-CM

## 2016-09-06 DIAGNOSIS — E039 Hypothyroidism, unspecified: Secondary | ICD-10-CM

## 2016-09-06 DIAGNOSIS — R5382 Chronic fatigue, unspecified: Secondary | ICD-10-CM

## 2016-09-06 DIAGNOSIS — F331 Major depressive disorder, recurrent, moderate: Secondary | ICD-10-CM

## 2016-09-06 DIAGNOSIS — G43709 Chronic migraine without aura, not intractable, without status migrainosus: Secondary | ICD-10-CM

## 2016-09-06 DIAGNOSIS — I4891 Unspecified atrial fibrillation: Secondary | ICD-10-CM

## 2016-09-06 DIAGNOSIS — E782 Mixed hyperlipidemia: Secondary | ICD-10-CM

## 2016-09-06 DIAGNOSIS — K219 Gastro-esophageal reflux disease without esophagitis: Secondary | ICD-10-CM

## 2016-09-06 DIAGNOSIS — F411 Generalized anxiety disorder: Secondary | ICD-10-CM

## 2016-09-06 LAB — TSH: TSH: 1.275 u[IU]/mL (ref 0.350–4.500)

## 2016-09-06 MED ORDER — BUSPIRONE HCL 15 MG PO TABS: 15.0000 mg | ORAL_TABLET | Freq: Two times a day (BID) | ORAL | 1 refills | Status: DC

## 2016-09-06 MED ORDER — LEVOTHYROXINE SODIUM 125 MCG PO TABS: 125.0000 ug | ORAL_TABLET | Freq: Every day | ORAL | 3 refills | Status: DC

## 2016-09-06 MED ORDER — OMEPRAZOLE 20 MG PO CPDR: DELAYED_RELEASE_CAPSULE | ORAL | 1 refills | Status: DC

## 2016-09-06 MED ORDER — BUPROPION HCL ER (XL) 150 MG PO TB24: 150.0000 mg | ORAL_TABLET | Freq: Every day | ORAL | 1 refills | Status: DC

## 2016-09-06 MED ORDER — NAPROXEN 500 MG PO TABS: 500.0000 mg | ORAL_TABLET | Freq: Two times a day (BID) | ORAL | 1 refills | Status: DC

## 2016-09-06 NOTE — Assessment & Plan Note (Signed)
Stable. Refills given today. Continue to monitor. Call with any concerns.   

## 2016-09-06 NOTE — Assessment & Plan Note (Signed)
Cannot afford labs. Normal last year.

## 2016-09-06 NOTE — Assessment & Plan Note (Signed)
Stable. Continue to monitor. Call with any concerns.  ?

## 2016-09-06 NOTE — Progress Notes (Signed)
BP 114/74 (BP Location: Right Arm, Patient Position: Sitting, Cuff Size: Large)   Pulse 75   Wt 199 lb 9.6 oz (90.5 kg)   SpO2 98%   BMI 33.22 kg/m    Subjective:    [name], female    DOB: 1955/10/10, 61 y.o.   MRN: [id number]  HPI: [name] is a 61 y.o. female  Chief Complaint  Patient presents with  . Hypothyroidism  . Anxiety  . Depression   Back pain has been about the same, but her back is feeling terrible. Would like to start naproxen.   ANXIETY/DEPRESSION Duration:Chronic- stable Anxious mood: yes  Excessive worrying: yes Irritability: yes  Sweating: no Nausea: no Palpitations:no Hyperventilation: no Panic attacks: no Agoraphobia: no  Obscessions/compulsions: no Depressed mood: no Depression screen [facility] 2/9 09/06/2016 02/03/2015  Decreased Interest 0 2  Down, Depressed, Hopeless 0 2  PHQ - 2 Score 0 4  Altered sleeping 1 2  Tired, decreased energy 1 3  Change in appetite 0 2  Feeling bad or failure about yourself  0 0  Trouble concentrating 0 1  Moving slowly or fidgety/restless 0 1  Suicidal thoughts 0 0  PHQ-9 Score 2 13   Anhedonia: no Weight changes: no Insomnia: no   Hypersomnia: no Fatigue/loss of energy: yes Feelings of worthlessness: no Feelings of guilt: no Impaired concentration/indecisiveness: no Suicidal ideations: no  Crying spells: no Recent Stressors/Life Changes: no   HYPOTHYROIDISM Thyroid control status:controlled Satisfied with current treatment? yes Medication side effects: no Medication compliance: excellent compliance Recent dose adjustment:no Fatigue: yes Cold intolerance: no Heat intolerance: no Weight gain: no Weight loss: no Constipation: no Diarrhea/loose stools: no Palpitations: no Lower extremity edema: no Anxiety/depressed mood: no  Relevant past medical, surgical, family and social history reviewed and updated as indicated. Interim medical history since our last visit  reviewed. Allergies and medications reviewed and updated.  Review of Systems  Constitutional: Negative.   Respiratory: Negative.   Cardiovascular: Negative.   Musculoskeletal: Positive for back pain and myalgias. Negative for arthralgias, gait problem, joint swelling, neck pain and neck stiffness.  Psychiatric/Behavioral: Negative.     Per HPI unless specifically indicated above     Objective:    BP 114/74 (BP Location: Right Arm, Patient Position: Sitting, Cuff Size: Large)   Pulse 75   Wt 199 lb 9.6 oz (90.5 kg)   SpO2 98%   BMI 33.22 kg/m   Wt Readings from Last 3 Encounters:  09/06/16 199 lb 9.6 oz (90.5 kg)  01/02/16 202 lb (91.6 kg)  12/08/15 203 lb 6.4 oz (92.3 kg)    Physical Exam  Constitutional: She is oriented to person, place, and time. She appears well-developed and well-nourished. No distress.  HENT:  Head: Normocephalic and atraumatic.  Right Ear: Hearing normal.  Left Ear: Hearing normal.  Nose: Nose normal.  Eyes: Conjunctivae and lids are normal. Right eye exhibits no discharge. Left eye exhibits no discharge. No scleral icterus.  Cardiovascular: Normal rate, regular rhythm, normal heart sounds and intact distal pulses.  Exam reveals no gallop and no friction rub.   No murmur heard. Pulmonary/Chest: Effort normal and breath sounds normal. No respiratory distress. She has no wheezes. She has no rales. She exhibits no tenderness.  Musculoskeletal: Normal range of motion.  Neurological: She is alert and oriented to person, place, and time.  Skin: Skin is warm, dry and intact. No rash noted. She is not diaphoretic. No erythema. No pallor.  Psychiatric: She has a normal mood and affect. Her speech is normal and behavior is normal. Judgment and thought content normal. Cognition and memory are normal.  Nursing note and vitals reviewed.   Results for orders placed or performed in visit on 01/02/16  CBC with Differential/Platelet  Result Value Ref Range   WBC  9.3 3.4 - 10.8 x10E3/uL   RBC 4.79 3.77 - 5.28 x10E6/uL   Hemoglobin 14.5 11.1 - 15.9 g/dL   Hematocrit 16.1 09.6 - 46.6 %   MCV 92 79 - 97 fL   MCH 30.3 26.6 - 33.0 pg   MCHC 33.0 31.5 - 35.7 g/dL   RDW 04.5 40.9 - 81.1 %   Platelets 291 150 - 379 x10E3/uL   Neutrophils 63 %   Lymphs 31 %   Monocytes 5 %   Eos 1 %   Basos 0 %   Neutrophils Absolute 5.8 1.4 - 7.0 x10E3/uL   Lymphocytes Absolute 2.9 0.7 - 3.1 x10E3/uL   Monocytes Absolute 0.5 0.1 - 0.9 x10E3/uL   EOS (ABSOLUTE) 0.1 0.0 - 0.4 x10E3/uL   Basophils Absolute 0.0 0.0 - 0.2 x10E3/uL   Immature Granulocytes 0 %   Immature Grans (Abs) 0.0 0.0 - 0.1 x10E3/uL  Comprehensive metabolic panel  Result Value Ref Range   Glucose 98 65 - 99 mg/dL   BUN 9 6 - 24 mg/dL   Creatinine, Ser 9.14 0.57 - 1.00 mg/dL   GFR calc non Af Amer 67 >59 mL/min/1.73   GFR calc Af Amer 77 >59 mL/min/1.73   BUN/Creatinine Ratio 10 9 - 23   Sodium 143 134 - 144 mmol/L   Potassium 4.4 3.5 - 5.2 mmol/L   Chloride 103 96 - 106 mmol/L   CO2 24 18 - 29 mmol/L   Calcium 9.5 8.7 - 10.2 mg/dL   Total Protein 6.6 6.0 - 8.5 g/dL   Albumin 4.2 3.5 - 5.5 g/dL   Globulin, Total 2.4 1.5 - 4.5 g/dL   Albumin/Globulin Ratio 1.8 1.2 - 2.2   Bilirubin Total 0.4 0.0 - 1.2 mg/dL   Alkaline Phosphatase 79 39 - 117 IU/L   AST 19 0 - 40 IU/L   ALT 15 0 - 32 IU/L  B Nat Peptide  Result Value Ref Range   BNP 35.6 0.0 - 100.0 pg/mL  Thyroid Panel With TSH  Result Value Ref Range   TSH 0.922 0.450 - 4.500 uIU/mL   T4, Total 9.3 4.5 - 12.0 ug/dL   T3 Uptake Ratio 27 24 - 39 %   Free Thyroxine Index 2.5 1.2 - 4.9  B12 and Folate Panel  Result Value Ref Range   Vitamin B-12 491 211 - 946 pg/mL   Folate 4.8 >3.0 ng/mL      Assessment & Plan:   Problem List Items Addressed This Visit      Cardiovascular and Mediastinum   Atrial fibrillation (HCC)    Stable. Continue to follow with cardiology. Continue to monitor. Call with any concerns.        Migraines     Stable. Continue to monitor. Call with any concerns.        Relevant Medications   naproxen (NAPROSYN) 500 MG tablet   buPROPion (WELLBUTRIN XL) 150 MG 24 hr tablet     Digestive   GERD (gastroesophageal reflux disease)    Stable. Continue to monitor. Call with any concerns.        Relevant Medications   omeprazole (PRILOSEC) 20 MG capsule  Endocrine   Hypothyroidism    Stable. Continue to monitor. Call with any concerns.        Relevant Orders   TSH     Genitourinary   CKD (chronic kidney disease) stage 3, GFR 30-59 ml/min    Stable. Cannot afford labs now. Normal last year.         Other   Hyperlipidemia    Cannot afford labs. Normal last year.       Anxiety state    Stable. Refills given today. Continue to monitor. Call with any concerns.        Relevant Medications   buPROPion (WELLBUTRIN XL) 150 MG 24 hr tablet   busPIRone (BUSPAR) 15 MG tablet   Depression, major, recurrent, moderate (HCC) - Primary    Stable. Refills given today. Continue to monitor. Call with any concerns.        Relevant Medications   buPROPion (WELLBUTRIN XL) 150 MG 24 hr tablet   busPIRone (BUSPAR) 15 MG tablet   Chronic fatigue    Stable. Refills given today. Continue to monitor. Call with any concerns.            Follow up plan: Return in about 1 year (around 09/06/2017) for Follow up .

## 2016-09-06 NOTE — Assessment & Plan Note (Signed)
Stable. Continue to follow with cardiology. Continue to monitor. Call with any concerns.  

## 2016-09-06 NOTE — Assessment & Plan Note (Signed)
Stable. Cannot afford labs now. Normal last year.

## 2016-09-06 NOTE — Assessment & Plan Note (Addendum)
Stable. Continue to monitor. Call with any concerns.  ?

## 2016-09-06 NOTE — Assessment & Plan Note (Signed)
Stable. Refills given today. Continue to monitor. Call with any concerns.

## 2016-11-01 ENCOUNTER — Ambulatory Visit: Payer: Self-pay | Admitting: Internal Medicine

## 2016-12-26 ENCOUNTER — Telehealth: Payer: Self-pay | Admitting: Internal Medicine

## 2016-12-26 NOTE — Telephone Encounter (Signed)
3 attempts to schedule recall 6 month f/u per checkout 12/08/15 Deleting recall

## 2017-01-09 ENCOUNTER — Other Ambulatory Visit: Payer: Self-pay | Admitting: Internal Medicine

## 2017-01-10 NOTE — Telephone Encounter (Signed)
Patient will need appointment for any further refills. Sent in 1 month supply

## 2017-01-10 NOTE — Telephone Encounter (Signed)
Patient is requesting a refill on Propafenone 225 mg, the patient has not followed back up in the office since 12/2015.  There is a message from the front staff canceling recall due to non compliance with her follow up visits.  Please advise if okay to refill the propafenone.

## 2017-04-11 ENCOUNTER — Other Ambulatory Visit: Payer: Self-pay | Admitting: Family Medicine

## 2017-04-11 NOTE — Telephone Encounter (Signed)
Copied from CRM [phone]. Topic: Quick Communication - Rx Refill/Question >> Apr 11, 2017  3:21 PM Louie BunPalacios Medina, Rosey Batheresa D wrote: Has the patient contacted their pharmacy? Yes (Agent: If no, request that the patient contact the pharmacy for the refill.) Preferred Pharmacy (with phone number or street name): [facility] on [address] Agent: Please be advised that RX refills may take up to 3 business days. We ask that you follow-up with your pharmacy. Patient needs refill on herbuPROPion (WELLBUTRIN XL) 150 MG 24 hr tablet to last her until her 09/2017 appt.

## 2017-04-12 NOTE — Telephone Encounter (Signed)
Didn't know if he wanted the Wellbutrin refilled.  Last OV 09/06/16.   Thanks.  I refilled the Prilosec.   Thanks.

## 2017-08-08 ENCOUNTER — Other Ambulatory Visit: Payer: Self-pay | Admitting: Family Medicine

## 2017-08-08 NOTE — Telephone Encounter (Signed)
Copied from CRM [phone]. Topic: Quick Communication - Rx Refill/Question >> Aug 08, 2017  2:10 PM Louie BunPalacios Medina, Rosey Batheresa D wrote: Medication: buPROPion (WELLBUTRIN XL) 150 MG 24 hr tablet Has the patient contacted their pharmacy? Yes (Agent: If no, request that the patient contact the pharmacy for the refill.) Preferred Pharmacy (with phone number or street name): [facility] [address] [place] - [phone] Agent: Please be advised that RX refills may take up to 3 business days. We ask that you follow-up with your pharmacy.

## 2017-08-08 NOTE — Telephone Encounter (Signed)
Rx was  Refilled  Earlier  Today  By surescripts

## 2017-10-09 ENCOUNTER — Other Ambulatory Visit
Admission: RE | Admit: 2017-10-09 | Discharge: 2017-10-09 | Disposition: A | Discharge status: Home / Self Care | Payer: Self-pay | Source: Ambulatory Visit | Attending: Family Medicine | Admitting: Family Medicine

## 2017-10-09 ENCOUNTER — Ambulatory Visit (INDEPENDENT_AMBULATORY_CARE_PROVIDER_SITE_OTHER): Payer: Self-pay | Admitting: Family Medicine

## 2017-10-09 DIAGNOSIS — Z0000 Encounter for general adult medical examination without abnormal findings: Secondary | ICD-10-CM | POA: Insufficient documentation

## 2017-10-09 DIAGNOSIS — F331 Major depressive disorder, recurrent, moderate: Secondary | ICD-10-CM

## 2017-10-09 LAB — CBC
HEMATOCRIT: 44.9 % (ref 35.0–47.0)
HEMOGLOBIN: 15.1 g/dL (ref 12.0–16.0)
MCH: 30.9 pg (ref 26.0–34.0)
MCHC: 33.6 g/dL (ref 32.0–36.0)
MCV: 92 fL (ref 80.0–100.0)
PLATELETS: 251 10*3/uL (ref 150–440)
RBC: 4.87 MIL/uL (ref 3.80–5.20)
RDW: 13.2 % (ref 11.5–14.5)
WBC: 11.3 10*3/uL — AB (ref 3.6–11.0)

## 2017-10-09 LAB — URINALYSIS, COMPLETE (UACMP) WITH MICROSCOPIC
BILIRUBIN URINE: NEGATIVE
Bacteria, UA: NONE SEEN
GLUCOSE, UA: NEGATIVE mg/dL
Ketones, ur: NEGATIVE mg/dL
NITRITE: NEGATIVE
Protein, ur: NEGATIVE mg/dL
SPECIFIC GRAVITY, URINE: 1.013 (ref 1.005–1.030)
pH: 5 (ref 5.0–8.0)

## 2017-10-09 LAB — COMPREHENSIVE METABOLIC PANEL
ALT: 15 U/L (ref 14–54)
AST: 24 U/L (ref 15–41)
Albumin: 4.2 g/dL (ref 3.5–5.0)
Alkaline Phosphatase: 75 U/L (ref 38–126)
Anion gap: 10 (ref 5–15)
BUN: 12 mg/dL (ref 6–20)
CALCIUM: 9.4 mg/dL (ref 8.9–10.3)
CHLORIDE: 104 mmol/L (ref 101–111)
CO2: 25 mmol/L (ref 22–32)
CREATININE: 1.02 mg/dL — AB (ref 0.44–1.00)
GFR calc Af Amer: >60 mL/min (ref >60)
GFR, EST NON AFRICAN AMERICAN: 58 mL/min — AB (ref >60)
Glucose, Bld: 115 mg/dL — ABNORMAL HIGH (ref 65–99)
Potassium: 3.5 mmol/L (ref 3.5–5.1)
Sodium: 139 mmol/L (ref 135–145)
Total Bilirubin: 0.4 mg/dL (ref 0.3–1.2)
Total Protein: 7.4 g/dL (ref 6.5–8.1)

## 2017-10-09 LAB — LIPID PANEL
CHOLESTEROL: 238 mg/dL — AB (ref 0–200)
HDL: 45 mg/dL (ref >40)
LDL CALC: 163 mg/dL — AB (ref 0–99)
Total CHOL/HDL Ratio: 5.3 RATIO
Triglycerides: 150 mg/dL — ABNORMAL HIGH (ref <150)
VLDL: 30 mg/dL (ref 0–40)

## 2017-10-09 LAB — TSH: TSH: 1.667 u[IU]/mL (ref 0.350–4.500)

## 2017-10-09 MED ORDER — BUPROPION HCL ER (XL) 150 MG PO TB24: 150.0000 mg | ORAL_TABLET | Freq: Every day | ORAL | 4 refills | Status: DC

## 2017-10-09 MED ORDER — PANTOPRAZOLE SODIUM 20 MG PO TBEC: 20.0000 mg | DELAYED_RELEASE_TABLET | Freq: Every day | ORAL | 4 refills | Status: DC

## 2017-10-09 MED ORDER — LEVOTHYROXINE SODIUM 125 MCG PO TABS: 125.0000 ug | ORAL_TABLET | Freq: Every day | ORAL | 4 refills | Status: DC

## 2017-10-09 MED ORDER — CITALOPRAM HYDROBROMIDE 10 MG PO TABS: 10.0000 mg | ORAL_TABLET | Freq: Every day | ORAL | 1 refills | Status: DC

## 2017-10-09 NOTE — Progress Notes (Signed)
   There were no vitals taken for this visit.   Subjective:    [name], female    DOB: 02/02/1956, 62 y.o.   MRN: [id number]  HPI: [name] Blarelthea S Eriksen is a 62 y.o. female  Chief Complaint  Patient presents with  . Follow-up  . Depression  Follow-up actually for physical  Patient on further review though has significant depression with loss of interest in usual things no energy feeling sad blue tearful, no suicidal ideation. On review with patient 1 of her issues is finances and insurance.  Has very limited insurance that only pays for wellness visits will limit this charging to physical exam only.    Relevant past medical, surgical, family and social history reviewed and updated as indicated. Interim medical history since our last visit reviewed. Allergies and medications reviewed and updated.  Review of Systems  Constitutional: Negative.   HENT: Negative.   Eyes: Negative.   Respiratory: Negative.   Cardiovascular: Negative.   Gastrointestinal: Negative.   Endocrine: Negative.   Genitourinary: Negative.   Musculoskeletal: Negative.   Skin: Negative.   Allergic/Immunologic: Negative.   Neurological: Negative.   Hematological: Negative.   Psychiatric/Behavioral: Negative.     Per HPI unless specifically indicated above     Objective:    There were no vitals taken for this visit.  Wt Readings from Last 3 Encounters:  09/06/16 199 lb 9.6 oz (90.5 kg)  01/02/16 202 lb (91.6 kg)  12/08/15 203 lb 6.4 oz (92.3 kg)    Physical Exam  Constitutional: She is oriented to person, place, and time. She appears well-developed and well-nourished.  HENT:  Head: Normocephalic and atraumatic.  Right Ear: External ear normal.  Left Ear: External ear normal.  Nose: Nose normal.  Mouth/Throat: Oropharynx is clear and moist.  Eyes: Pupils are equal, round, and reactive to light. Conjunctivae and EOM are normal.  Neck: Normal range of motion. Neck supple. Carotid  bruit is not present.  Cardiovascular: Normal rate, regular rhythm and normal heart sounds.  No murmur heard. Pulmonary/Chest: Effort normal and breath sounds normal. She exhibits no mass. Right breast exhibits no mass, no skin change and no tenderness. Left breast exhibits no mass, no skin change and no tenderness. Breasts are symmetrical.  Abdominal: Soft. Bowel sounds are normal. There is no hepatosplenomegaly.  Musculoskeletal: Normal range of motion.  Neurological: She is alert and oriented to person, place, and time.  Skin: No rash noted.  Psychiatric: She has a normal mood and affect. Her behavior is normal. Judgment and thought content normal.    Results for orders placed or performed during the hospital encounter of 09/06/16  TSH  Result Value Ref Range   TSH 1.275 0.350 - 4.500 uIU/mL      Assessment & Plan:   Problem List Items Addressed This Visit      Other   Depression, major, recurrent, moderate (HCC)    Ongoing worsening depression will add citalopram to Wellbutrin stop BuSpar and observe recheck 2 to 3 weeks.      Relevant Medications   buPROPion (WELLBUTRIN XL) 150 MG 24 hr tablet   citalopram (CELEXA) 10 MG tablet    Other Visit Diagnoses    PE (physical exam), annual    -  Primary       Follow up plan: Return in about 2 weeks (around 10/23/2017) for Med check.

## 2017-10-09 NOTE — Assessment & Plan Note (Signed)
Ongoing worsening depression will add citalopram to Wellbutrin stop BuSpar and observe recheck 2 to 3 weeks.

## 2017-10-10 ENCOUNTER — Telehealth: Payer: Self-pay | Admitting: Family Medicine

## 2017-10-10 NOTE — Telephone Encounter (Signed)
Phone call Discussed with patient markedly elevated cholesterol patient's been eating [employer] food just about every day as better diet exercise nutrition and follow-up as needed.

## 2017-10-10 NOTE — Telephone Encounter (Signed)
-----   Message from Richarda OverlieJada A [place], [place] sent at 10/10/2017 12:16 PM EDT ----- Patient was transferred to provider for telephone conversation.

## 2017-10-23 ENCOUNTER — Ambulatory Visit (INDEPENDENT_AMBULATORY_CARE_PROVIDER_SITE_OTHER): Payer: Self-pay | Admitting: Family Medicine

## 2017-10-23 ENCOUNTER — Encounter: Payer: Self-pay | Admitting: Family Medicine

## 2017-10-23 DIAGNOSIS — F331 Major depressive disorder, recurrent, moderate: Secondary | ICD-10-CM

## 2017-10-23 MED ORDER — CITALOPRAM HYDROBROMIDE 10 MG PO TABS: 10.0000 mg | ORAL_TABLET | Freq: Every day | ORAL | 3 refills | Status: DC

## 2017-10-23 NOTE — Progress Notes (Signed)
BP 128/84   Pulse 64   Ht 5\' 5"  (1.651 m)   Wt 205 lb 14.4 oz (93.4 kg)   SpO2 97%   BMI 34.26 kg/m    Subjective:    [name], female    DOB: 01/22/1956, 62 y.o.   MRN: [id number]  HPI: [name] is a 62 y.o. female  Chief Complaint  Patient presents with  . Follow-up    Depression. Pt states she's only had 2 "crying" attacks.    Patient reports doing well took some time to start getting better but is able to sleep without problems now starting to smile and interact more is able to read and enjoy reading again.  Had some side effects at first with some loose stools but that is cleared up.  Patient reports about 60 to 70% improved. PHQ 9 score of 11.  No suicidal ideation.  Relevant past medical, surgical, family and social history reviewed and updated as indicated. Interim medical history since our last visit reviewed. Allergies and medications reviewed and updated.  Review of Systems  Constitutional: Negative.   Respiratory: Negative.   Cardiovascular: Negative.     Per HPI unless specifically indicated above     Objective:    BP 128/84   Pulse 64   Ht 5\' 5"  (1.651 m)   Wt 205 lb 14.4 oz (93.4 kg)   SpO2 97%   BMI 34.26 kg/m   Wt Readings from Last 3 Encounters:  10/23/17 205 lb 14.4 oz (93.4 kg)  09/06/16 199 lb 9.6 oz (90.5 kg)  01/02/16 202 lb (91.6 kg)    Physical Exam  Constitutional: She is oriented to person, place, and time. She appears well-developed and well-nourished.  HENT:  Head: Normocephalic and atraumatic.  Eyes: Conjunctivae and EOM are normal.  Neck: Normal range of motion.  Cardiovascular: Normal rate, regular rhythm and normal heart sounds.  Pulmonary/Chest: Effort normal and breath sounds normal.  Musculoskeletal: Normal range of motion.  Neurological: She is alert and oriented to person, place, and time.  Skin: No erythema.  Psychiatric: She has a normal mood and affect. Her behavior is normal. Judgment and  thought content normal.    Results for orders placed or performed during the hospital encounter of 10/09/17  CBC  Result Value Ref Range   WBC 11.3 (H) 3.6 - 11.0 K/uL   RBC 4.87 3.80 - 5.20 MIL/uL   Hemoglobin 15.1 12.0 - 16.0 g/dL   HCT 40.944.9 81.135.0 - 91.447.0 %   MCV 92.0 80.0 - 100.0 fL   MCH 30.9 26.0 - 34.0 pg   MCHC 33.6 32.0 - 36.0 g/dL   RDW 78.213.2 95.611.5 - 21.314.5 %   Platelets 251 150 - 440 K/uL  Comprehensive metabolic panel  Result Value Ref Range   Sodium 139 135 - 145 mmol/L   Potassium 3.5 3.5 - 5.1 mmol/L   Chloride 104 101 - 111 mmol/L   CO2 25 22 - 32 mmol/L   Glucose, Bld 115 (H) 65 - 99 mg/dL   BUN 12 6 - 20 mg/dL   Creatinine, Ser 0.861.02 (H) 0.44 - 1.00 mg/dL   Calcium 9.4 8.9 - 57.810.3 mg/dL   Total Protein 7.4 6.5 - 8.1 g/dL   Albumin 4.2 3.5 - 5.0 g/dL   AST 24 15 - 41 U/L   ALT 15 14 - 54 U/L   Alkaline Phosphatase 75 38 - 126 U/L   Total Bilirubin 0.4 0.3 - 1.2  mg/dL   GFR calc non Af Amer 58 (L) >60 mL/min   GFR calc Af Amer >60 >60 mL/min   Anion gap 10 5 - 15  Lipid panel  Result Value Ref Range   Cholesterol 238 (H) 0 - 200 mg/dL   Triglycerides 409 (H) <150 mg/dL   HDL 45 >81 mg/dL   Total CHOL/HDL Ratio 5.3 RATIO   VLDL 30 0 - 40 mg/dL   LDL Cholesterol 191 (H) 0 - 99 mg/dL  TSH  Result Value Ref Range   TSH 1.667 0.350 - 4.500 uIU/mL  Urinalysis, Complete w Microscopic  Result Value Ref Range   Color, Urine YELLOW (A) YELLOW   APPearance HAZY (A) CLEAR   Specific Gravity, Urine 1.013 1.005 - 1.030   pH 5.0 5.0 - 8.0   Glucose, UA NEGATIVE NEGATIVE mg/dL   Hgb urine dipstick SMALL (A) NEGATIVE   Bilirubin Urine NEGATIVE NEGATIVE   Ketones, ur NEGATIVE NEGATIVE mg/dL   Protein, ur NEGATIVE NEGATIVE mg/dL   Nitrite NEGATIVE NEGATIVE   Leukocytes, UA SMALL (A) NEGATIVE   RBC / HPF 0-5 0 - 5 RBC/hpf   WBC, UA 6-10 0 - 5 WBC/hpf   Bacteria, UA NONE SEEN NONE SEEN   Squamous Epithelial / LPF 0-5 0 - 5      Assessment & Plan:   Problem List  Items Addressed This Visit      Other   Depression, major, recurrent, moderate (HCC)    Improving depression.  We will continue current medications follow-up in a month or so.      Relevant Medications   citalopram (CELEXA) 10 MG tablet       Follow up plan: Return in about 1 month (around 11/20/2017).

## 2017-10-23 NOTE — Assessment & Plan Note (Signed)
Improving depression.  We will continue current medications follow-up in a month or so.

## 2017-12-31 ENCOUNTER — Ambulatory Visit (INDEPENDENT_AMBULATORY_CARE_PROVIDER_SITE_OTHER): Payer: Self-pay | Admitting: Family Medicine

## 2017-12-31 ENCOUNTER — Encounter: Payer: Self-pay | Admitting: Family Medicine

## 2017-12-31 DIAGNOSIS — E039 Hypothyroidism, unspecified: Secondary | ICD-10-CM

## 2017-12-31 DIAGNOSIS — F411 Generalized anxiety disorder: Secondary | ICD-10-CM

## 2017-12-31 DIAGNOSIS — F331 Major depressive disorder, recurrent, moderate: Secondary | ICD-10-CM

## 2017-12-31 MED ORDER — CITALOPRAM HYDROBROMIDE 20 MG PO TABS: 20.0000 mg | ORAL_TABLET | Freq: Every day | ORAL | 2 refills | Status: DC

## 2017-12-31 NOTE — Assessment & Plan Note (Signed)
The current medical regimen is effective;  continue present plan and medications.  

## 2017-12-31 NOTE — Assessment & Plan Note (Signed)
Discussed depression and medications will increase citalopram from 10 to 20 mg and stop bupropion observe response.  If need be we will go back on bupropion but take in the morning.

## 2017-12-31 NOTE — Progress Notes (Signed)
BP 138/64   Pulse 82   Ht 5\' 9"  (1.753 m)   Wt 209 lb (94.8 kg)   SpO2 95%   BMI 30.86 kg/m    Subjective:    [name], female    DOB: [birth year], 62 y.o.   MRN: [id number]  HPI: [name] is a 62 y.o. female  Chief Complaint  Patient presents with  . Depression  . Anxiety  Patient all in all doing well depressions is improved dramatically on citalopram 10 mg has been doing well except for insomnia which is continuing to be a problem.  Is wondering about stopping Wellbutrin as having some insomnia issues and wondering if causing them to be worse. Also interested in may be increasing citalopram from 10-20.  Relevant past medical, surgical, family and social history reviewed and updated as indicated. Interim medical history since our last visit reviewed. Allergies and medications reviewed and updated.  Review of Systems  Constitutional: Negative.   Respiratory: Negative.   Cardiovascular: Negative.     Per HPI unless specifically indicated above     Objective:    BP 138/64   Pulse 82   Ht 5\' 9"  (1.753 m)   Wt 209 lb (94.8 kg)   SpO2 95%   BMI 30.86 kg/m   Wt Readings from Last 3 Encounters:  12/31/17 209 lb (94.8 kg)  10/23/17 205 lb 14.4 oz (93.4 kg)  09/06/16 199 lb 9.6 oz (90.5 kg)    Physical Exam  Constitutional: She is oriented to person, place, and time. She appears well-developed and well-nourished.  HENT:  Head: Normocephalic and atraumatic.  Eyes: Conjunctivae and EOM are normal.  Neck: Normal range of motion.  Cardiovascular: Normal rate, regular rhythm and normal heart sounds.  Pulmonary/Chest: Effort normal and breath sounds normal.  Musculoskeletal: Normal range of motion.  Neurological: She is alert and oriented to person, place, and time.  Skin: No erythema.  Psychiatric: She has a normal mood and affect. Her behavior is normal. Judgment and thought content normal.    Results for orders placed or performed during  the hospital encounter of 10/09/17  CBC  Result Value Ref Range   WBC 11.3 (H) 3.6 - 11.0 K/uL   RBC 4.87 3.80 - 5.20 MIL/uL   Hemoglobin 15.1 12.0 - 16.0 g/dL   HCT 78.2 95.6 - 21.3 %   MCV 92.0 80.0 - 100.0 fL   MCH 30.9 26.0 - 34.0 pg   MCHC 33.6 32.0 - 36.0 g/dL   RDW 08.6 57.8 - 46.9 %   Platelets 251 150 - 440 K/uL  Comprehensive metabolic panel  Result Value Ref Range   Sodium 139 135 - 145 mmol/L   Potassium 3.5 3.5 - 5.1 mmol/L   Chloride 104 101 - 111 mmol/L   CO2 25 22 - 32 mmol/L   Glucose, Bld 115 (H) 65 - 99 mg/dL   BUN 12 6 - 20 mg/dL   Creatinine, Ser 6.29 (H) 0.44 - 1.00 mg/dL   Calcium 9.4 8.9 - 52.8 mg/dL   Total Protein 7.4 6.5 - 8.1 g/dL   Albumin 4.2 3.5 - 5.0 g/dL   AST 24 15 - 41 U/L   ALT 15 14 - 54 U/L   Alkaline Phosphatase 75 38 - 126 U/L   Total Bilirubin 0.4 0.3 - 1.2 mg/dL   GFR calc non Af Amer 58 (L) >60 mL/min   GFR calc Af Amer >60 >60 mL/min   Anion gap  10 5 - 15  Lipid panel  Result Value Ref Range   Cholesterol 238 (H) 0 - 200 mg/dL   Triglycerides 130150 (H) <150 mg/dL   HDL 45 >86>40 mg/dL   Total CHOL/HDL Ratio 5.3 RATIO   VLDL 30 0 - 40 mg/dL   LDL Cholesterol 578163 (H) 0 - 99 mg/dL  TSH  Result Value Ref Range   TSH 1.667 0.350 - 4.500 uIU/mL  Urinalysis, Complete w Microscopic  Result Value Ref Range   Color, Urine YELLOW (A) YELLOW   APPearance HAZY (A) CLEAR   Specific Gravity, Urine 1.013 1.005 - 1.030   pH 5.0 5.0 - 8.0   Glucose, UA NEGATIVE NEGATIVE mg/dL   Hgb urine dipstick SMALL (A) NEGATIVE   Bilirubin Urine NEGATIVE NEGATIVE   Ketones, ur NEGATIVE NEGATIVE mg/dL   Protein, ur NEGATIVE NEGATIVE mg/dL   Nitrite NEGATIVE NEGATIVE   Leukocytes, UA SMALL (A) NEGATIVE   RBC / HPF 0-5 0 - 5 RBC/hpf   WBC, UA 6-10 0 - 5 WBC/hpf   Bacteria, UA NONE SEEN NONE SEEN   Squamous Epithelial / LPF 0-5 0 - 5      Assessment & Plan:   Problem List Items Addressed This Visit      Endocrine   Hypothyroidism    The current  medical regimen is effective;  continue present plan and medications.         Other   Anxiety state    The current medical regimen is effective;  continue present plan and medications.       Relevant Medications   citalopram (CELEXA) 20 MG tablet   Depression, major, recurrent, moderate (HCC)    Discussed depression and medications will increase citalopram from 10 to 20 mg and stop bupropion observe response.  If need be we will go back on bupropion but take in the morning.      Relevant Medications   citalopram (CELEXA) 20 MG tablet       Follow up plan: No follow-ups on file.

## 2018-06-02 ENCOUNTER — Ambulatory Visit (INDEPENDENT_AMBULATORY_CARE_PROVIDER_SITE_OTHER): Payer: Self-pay | Admitting: Family Medicine

## 2018-06-02 ENCOUNTER — Encounter: Payer: Self-pay | Admitting: Family Medicine

## 2018-06-02 DIAGNOSIS — E782 Mixed hyperlipidemia: Secondary | ICD-10-CM

## 2018-06-02 DIAGNOSIS — F331 Major depressive disorder, recurrent, moderate: Secondary | ICD-10-CM

## 2018-06-02 DIAGNOSIS — I4891 Unspecified atrial fibrillation: Secondary | ICD-10-CM

## 2018-06-02 MED ORDER — CITALOPRAM HYDROBROMIDE 20 MG PO TABS: 20.0000 mg | ORAL_TABLET | Freq: Every day | ORAL | 2 refills | Status: DC

## 2018-06-02 NOTE — Assessment & Plan Note (Signed)
stable °

## 2018-06-02 NOTE — Assessment & Plan Note (Signed)
The current medical regimen is effective;  continue present plan and medications.  

## 2018-06-02 NOTE — Progress Notes (Signed)
BP 124/74 (BP Location: Left Arm, Patient Position: Sitting, Cuff Size: Normal)   Pulse 87   Temp 98.1 F (36.7 C) (Oral)   Ht 5\' 5"  (1.651 m)   Wt 212 lb 12.8 oz (96.5 kg)   SpO2 95%   BMI 35.41 kg/m    Subjective:    [name], female    DOB: June 04, 1955, 63 y.o.   MRN: [id number]  HPI: [name] is a 63 y.o. female  Chief Complaint  Patient presents with  . Depression  . Medication Management  Patient all in all doing well having some dark days but mostly good days on the citalopram 20 mg no side effects or ill effects. Takes reflux without problems and good control. Same with thyroid takes without problems or issues. Has not needed Rythmol but has on standby.  Relevant past medical, surgical, family and social history reviewed and updated as indicated. Interim medical history since our last visit reviewed. Allergies and medications reviewed and updated.  Review of Systems  Constitutional: Negative.   Respiratory: Negative.   Cardiovascular: Negative.     Per HPI unless specifically indicated above     Objective:    BP 124/74 (BP Location: Left Arm, Patient Position: Sitting, Cuff Size: Normal)   Pulse 87   Temp 98.1 F (36.7 C) (Oral)   Ht 5\' 5"  (1.651 m)   Wt 212 lb 12.8 oz (96.5 kg)   SpO2 95%   BMI 35.41 kg/m   Wt Readings from Last 3 Encounters:  06/02/18 212 lb 12.8 oz (96.5 kg)  12/31/17 209 lb (94.8 kg)  10/23/17 205 lb 14.4 oz (93.4 kg)    Physical Exam Constitutional:      Appearance: She is well-developed.  HENT:     Head: Normocephalic and atraumatic.  Eyes:     Conjunctiva/sclera: Conjunctivae normal.  Neck:     Musculoskeletal: Normal range of motion.  Cardiovascular:     Rate and Rhythm: Normal rate and regular rhythm.     Heart sounds: Normal heart sounds.  Pulmonary:     Effort: Pulmonary effort is normal.     Breath sounds: Normal breath sounds.  Musculoskeletal: Normal range of motion.  Skin:  Findings: No erythema.  Neurological:     Mental Status: She is alert and oriented to person, place, and time.  Psychiatric:        Behavior: Behavior normal.        Thought Content: Thought content normal.        Judgment: Judgment normal.     Results for orders placed or performed during the hospital encounter of 10/09/17  CBC  Result Value Ref Range   WBC 11.3 (H) 3.6 - 11.0 K/uL   RBC 4.87 3.80 - 5.20 MIL/uL   Hemoglobin 15.1 12.0 - 16.0 g/dL   HCT 71.0 62.6 - 94.8 %   MCV 92.0 80.0 - 100.0 fL   MCH 30.9 26.0 - 34.0 pg   MCHC 33.6 32.0 - 36.0 g/dL   RDW 54.6 27.0 - 35.0 %   Platelets 251 150 - 440 K/uL  Comprehensive metabolic panel  Result Value Ref Range   Sodium 139 135 - 145 mmol/L   Potassium 3.5 3.5 - 5.1 mmol/L   Chloride 104 101 - 111 mmol/L   CO2 25 22 - 32 mmol/L   Glucose, Bld 115 (H) 65 - 99 mg/dL   BUN 12 6 - 20 mg/dL   Creatinine, Ser 0.93 (H)  0.44 - 1.00 mg/dL   Calcium 9.4 8.9 - 83.2 mg/dL   Total Protein 7.4 6.5 - 8.1 g/dL   Albumin 4.2 3.5 - 5.0 g/dL   AST 24 15 - 41 U/L   ALT 15 14 - 54 U/L   Alkaline Phosphatase 75 38 - 126 U/L   Total Bilirubin 0.4 0.3 - 1.2 mg/dL   GFR calc non Af Amer 58 (L) >60 mL/min   GFR calc Af Amer >60 >60 mL/min   Anion gap 10 5 - 15  Lipid panel  Result Value Ref Range   Cholesterol 238 (H) 0 - 200 mg/dL   Triglycerides 549 (H) <150 mg/dL   HDL 45 >82 mg/dL   Total CHOL/HDL Ratio 5.3 RATIO   VLDL 30 0 - 40 mg/dL   LDL Cholesterol 641 (H) 0 - 99 mg/dL  TSH  Result Value Ref Range   TSH 1.667 0.350 - 4.500 uIU/mL  Urinalysis, Complete w Microscopic  Result Value Ref Range   Color, Urine YELLOW (A) YELLOW   APPearance HAZY (A) CLEAR   Specific Gravity, Urine 1.013 1.005 - 1.030   pH 5.0 5.0 - 8.0   Glucose, UA NEGATIVE NEGATIVE mg/dL   Hgb urine dipstick SMALL (A) NEGATIVE   Bilirubin Urine NEGATIVE NEGATIVE   Ketones, ur NEGATIVE NEGATIVE mg/dL   Protein, ur NEGATIVE NEGATIVE mg/dL   Nitrite NEGATIVE  NEGATIVE   Leukocytes, UA SMALL (A) NEGATIVE   RBC / HPF 0-5 0 - 5 RBC/hpf   WBC, UA 6-10 0 - 5 WBC/hpf   Bacteria, UA NONE SEEN NONE SEEN   Squamous Epithelial / LPF 0-5 0 - 5      Assessment & Plan:   Problem List Items Addressed This Visit      Cardiovascular and Mediastinum   Atrial fibrillation (HCC)    The current medical regimen is effective;  continue present plan and medications.         Other   Hyperlipidemia    The current medical regimen is effective;  continue present plan and medications.       Depression, major, recurrent, moderate (HCC)    The current medical regimen is effective;  continue present plan and medications.       Relevant Medications   citalopram (CELEXA) 20 MG tablet       Follow up plan: Return for Physical Exam, June.

## 2018-07-08 ENCOUNTER — Telehealth: Payer: Self-pay | Admitting: Family Medicine

## 2018-07-08 DIAGNOSIS — R32 Unspecified urinary incontinence: Secondary | ICD-10-CM

## 2018-07-08 NOTE — Telephone Encounter (Signed)
Copied from CRM [id number]. Topic: Referral - Request for Referral >> Jul 08, 2018 12:05 PM Maia Petties wrote: Has patient seen PCP for this complaint? Yes.   *If NO, is insurance requiring patient see PCP for this issue before PCP can refer them? Referral for which specialty:  Preferred provider/office: [facility] (did pts bladder surgery 2010) [facility] [address] [place], [place]  [address]  Appointments [phone]   Office [phone]  Fax [phone]  Reason for referral: clinical trial pt wants to participate in for reconstructive incontinence

## 2018-10-25 ENCOUNTER — Other Ambulatory Visit: Payer: Self-pay | Admitting: Family Medicine

## 2018-11-03 ENCOUNTER — Ambulatory Visit (INDEPENDENT_AMBULATORY_CARE_PROVIDER_SITE_OTHER): Payer: Self-pay | Admitting: Family Medicine

## 2018-11-03 ENCOUNTER — Encounter: Payer: Self-pay | Admitting: Family Medicine

## 2018-11-03 ENCOUNTER — Other Ambulatory Visit: Payer: Self-pay

## 2018-11-03 DIAGNOSIS — I4891 Unspecified atrial fibrillation: Secondary | ICD-10-CM

## 2018-11-03 DIAGNOSIS — G4733 Obstructive sleep apnea (adult) (pediatric): Secondary | ICD-10-CM

## 2018-11-03 DIAGNOSIS — E039 Hypothyroidism, unspecified: Secondary | ICD-10-CM

## 2018-11-03 DIAGNOSIS — F331 Major depressive disorder, recurrent, moderate: Secondary | ICD-10-CM

## 2018-11-03 DIAGNOSIS — K219 Gastro-esophageal reflux disease without esophagitis: Secondary | ICD-10-CM

## 2018-11-03 MED ORDER — PANTOPRAZOLE SODIUM 20 MG PO TBEC: 20.0000 mg | DELAYED_RELEASE_TABLET | Freq: Every day | ORAL | 4 refills | Status: DC

## 2018-11-03 MED ORDER — CITALOPRAM HYDROBROMIDE 20 MG PO TABS: 20.0000 mg | ORAL_TABLET | Freq: Every day | ORAL | 4 refills | Status: DC

## 2018-11-03 MED ORDER — LEVOTHYROXINE SODIUM 125 MCG PO TABS: 125.0000 ug | ORAL_TABLET | Freq: Every day | ORAL | 4 refills | Status: DC

## 2018-11-03 NOTE — Assessment & Plan Note (Signed)
Some sx will loose wt

## 2018-11-03 NOTE — Assessment & Plan Note (Signed)
Followed by cardiology 

## 2018-11-03 NOTE — Assessment & Plan Note (Signed)
The current medical regimen is effective;  continue present plan and medications.  

## 2018-11-03 NOTE — Progress Notes (Signed)
There were no vitals taken for this visit.   Subjective:    [name], female    DOB: 05/29/1955, 63 y.o.   MRN: [id number]  HPI: [name] is a 63 y.o. female  Med check  Discussed with patient all in all doing well taking medications without problems concerned about COVID-19 exposure and due to multiple risk factors especially atrial fibrillation needs to not have a job with exposure to the public. Thyroid reflux depression also stable.   Relevant past medical, surgical, family and social history reviewed and updated as indicated. Interim medical history since our last visit reviewed. Allergies and medications reviewed and updated.  Review of Systems  Constitutional: Negative.   HENT: Negative.   Eyes: Negative.   Respiratory: Negative.   Cardiovascular: Negative.   Gastrointestinal: Negative.   Endocrine: Negative.   Genitourinary: Negative.   Musculoskeletal: Negative.   Skin: Negative.   Allergic/Immunologic: Negative.   Neurological: Negative.   Hematological: Negative.   Psychiatric/Behavioral: Negative.     Per HPI unless specifically indicated above     Objective:    There were no vitals taken for this visit.  Wt Readings from Last 3 Encounters:  06/02/18 212 lb 12.8 oz (96.5 kg)  12/31/17 209 lb (94.8 kg)  10/23/17 205 lb 14.4 oz (93.4 kg)    Physical Exam  Results for orders placed or performed during the hospital encounter of 10/09/17  CBC  Result Value Ref Range   WBC 11.3 (H) 3.6 - 11.0 K/uL   RBC 4.87 3.80 - 5.20 MIL/uL   Hemoglobin 15.1 12.0 - 16.0 g/dL   HCT 44.9 35.0 - 47.0 %   MCV 92.0 80.0 - 100.0 fL   MCH 30.9 26.0 - 34.0 pg   MCHC 33.6 32.0 - 36.0 g/dL   RDW 13.2 11.5 - 14.5 %   Platelets 251 150 - 440 K/uL  Comprehensive metabolic panel  Result Value Ref Range   Sodium 139 135 - 145 mmol/L   Potassium 3.5 3.5 - 5.1 mmol/L   Chloride 104 101 - 111 mmol/L   CO2 25 22 - 32 mmol/L   Glucose, Bld 115 (H) 65 -  99 mg/dL   BUN 12 6 - 20 mg/dL   Creatinine, Ser 1.02 (H) 0.44 - 1.00 mg/dL   Calcium 9.4 8.9 - 10.3 mg/dL   Total Protein 7.4 6.5 - 8.1 g/dL   Albumin 4.2 3.5 - 5.0 g/dL   AST 24 15 - 41 U/L   ALT 15 14 - 54 U/L   Alkaline Phosphatase 75 38 - 126 U/L   Total Bilirubin 0.4 0.3 - 1.2 mg/dL   GFR calc non Af Amer 58 (L) >60 mL/min   GFR calc Af Amer >60 >60 mL/min   Anion gap 10 5 - 15  Lipid panel  Result Value Ref Range   Cholesterol 238 (H) 0 - 200 mg/dL   Triglycerides 150 (H) <150 mg/dL   HDL 45 >40 mg/dL   Total CHOL/HDL Ratio 5.3 RATIO   VLDL 30 0 - 40 mg/dL   LDL Cholesterol 163 (H) 0 - 99 mg/dL  TSH  Result Value Ref Range   TSH 1.667 0.350 - 4.500 uIU/mL  Urinalysis, Complete w Microscopic  Result Value Ref Range   Color, Urine YELLOW (A) YELLOW   APPearance HAZY (A) CLEAR   Specific Gravity, Urine 1.013 1.005 - 1.030   pH 5.0 5.0 - 8.0   Glucose, UA NEGATIVE NEGATIVE  mg/dL   Hgb urine dipstick SMALL (A) NEGATIVE   Bilirubin Urine NEGATIVE NEGATIVE   Ketones, ur NEGATIVE NEGATIVE mg/dL   Protein, ur NEGATIVE NEGATIVE mg/dL   Nitrite NEGATIVE NEGATIVE   Leukocytes, UA SMALL (A) NEGATIVE   RBC / HPF 0-5 0 - 5 RBC/hpf   WBC, UA 6-10 0 - 5 WBC/hpf   Bacteria, UA NONE SEEN NONE SEEN   Squamous Epithelial / LPF 0-5 0 - 5      Assessment & Plan:   Problem List Items Addressed This Visit      Cardiovascular and Mediastinum   Atrial fibrillation (HCC)    Followed by cardiology       Relevant Orders   Comprehensive metabolic panel   Lipid panel   CBC with Differential/Platelet   TSH   Urinalysis, Routine w reflex microscopic     Respiratory   OSA (obstructive sleep apnea)    Some sx will loose wt        Digestive   GERD (gastroesophageal reflux disease)    The current medical regimen is effective;  continue present plan and medications.       Relevant Medications   pantoprazole (PROTONIX) 20 MG tablet     Endocrine   Hypothyroidism    The  current medical regimen is effective;  continue present plan and medications.       Relevant Medications   levothyroxine (SYNTHROID) 125 MCG tablet   Other Relevant Orders   TSH     Other   Depression, major, recurrent, moderate (HCC)    The current medical regimen is effective;  continue present plan and medications.       Relevant Medications   citalopram (CELEXA) 20 MG tablet       Telemedicine using audio/video telecommunications for a synchronous communication visit. Today's visit due to COVID-19 isolation precautions I connected with and verified that I am speaking with the correct person using two identifiers.   I discussed the limitations, risks, security and privacy concerns of performing an evaluation and management service by telecommunication and the availability of in person appointments. I also discussed with the patient that there may be a patient responsible charge related to this service. The patient expressed understanding and agreed to proceed. The patient's location is I am at home.   I discussed the assessment and treatment plan with the patient. The patient was provided an opportunity to ask questions and all were answered. The patient agreed with the plan and demonstrated an understanding of the instructions.   The patient was advised to call back or seek an in-person evaluation if the symptoms worsen or if the condition fails to improve as anticipated.   I provided 21+ minutes of time during this encounter. Follow up plan: Return in about 1 year (around 11/03/2019).

## 2018-11-13 ENCOUNTER — Encounter: Payer: Self-pay | Admitting: Obstetrics and Gynecology

## 2018-11-13 ENCOUNTER — Ambulatory Visit (INDEPENDENT_AMBULATORY_CARE_PROVIDER_SITE_OTHER): Payer: Self-pay | Admitting: Obstetrics and Gynecology

## 2018-11-13 ENCOUNTER — Other Ambulatory Visit: Payer: Self-pay

## 2018-11-13 VITALS — BP 140/88 | HR 104 | Ht 65.0 in | Wt 218.0 lb

## 2018-11-13 DIAGNOSIS — Z5320 Procedure and treatment not carried out because of patient's decision for unspecified reasons: Secondary | ICD-10-CM

## 2018-11-13 DIAGNOSIS — Z13820 Encounter for screening for osteoporosis: Secondary | ICD-10-CM

## 2018-11-13 DIAGNOSIS — Z01419 Encounter for gynecological examination (general) (routine) without abnormal findings: Secondary | ICD-10-CM

## 2018-11-13 DIAGNOSIS — Z1239 Encounter for other screening for malignant neoplasm of breast: Secondary | ICD-10-CM

## 2018-11-13 DIAGNOSIS — Z0000 Encounter for general adult medical examination without abnormal findings: Secondary | ICD-10-CM

## 2018-11-13 DIAGNOSIS — Z124 Encounter for screening for malignant neoplasm of cervix: Secondary | ICD-10-CM

## 2018-11-13 NOTE — Progress Notes (Signed)
Gynecology Annual Exam  PCP: Guadalupe Maple, MD  Chief Complaint:  Chief Complaint  Patient presents with  . Gynecologic Exam    Had bladder surgery in 2010, partner said vagina feels like a "cheese grader" can feel the mesh when sitting, abnormal discharge    History of Present Illness:Patient is a 63 y.o. [id number] presents for annual exam. The patient has no complaints today.   LMP: No LMP recorded. Patient is postmenopausal. Menarche:not applicable Denies vaginal bleeding  The patient is currently sexually active. She denies dyspareunia.  The patient does not perform self breast exams.  There is no notable family history of breast or ovarian cancer in her family.  The patient wears seatbelts: yes.   The patient has regular exercise: no.    The patient denies current symptoms of depression.   She complains of a heavy malodorous vaginal discharge that comes and goes.   Also she reports that with sexual activity her partner reports that he can feel the mesh from her bladder sling.   Review of Systems: Review of Systems  Constitutional: Negative for chills, fever, malaise/fatigue and weight loss.  HENT: Negative for congestion, hearing loss and sinus pain.   Eyes: Negative for blurred vision and double vision.  Respiratory: Negative for cough, sputum production, shortness of breath and wheezing.   Cardiovascular: Negative for chest pain, palpitations, orthopnea and leg swelling.  Gastrointestinal: Negative for abdominal pain, constipation, diarrhea, nausea and vomiting.  Genitourinary: Negative for dysuria, flank pain, frequency, hematuria and urgency.  Musculoskeletal: Negative for back pain, falls and joint pain.  Skin: Negative for itching and rash.  Neurological: Negative for dizziness and headaches.  Psychiatric/Behavioral: Negative for depression, substance abuse and suicidal ideas. The patient is not nervous/anxious.     Past Medical History:  Past Medical  History:  Diagnosis Date  . Anxiety state, unspecified   . Back pain   . Chest pain    a. 2008 Cath: reportedly nl;  b. 09/2012 Lexi MV: EF 75%, soft tissue attenuation->Low risk.  Marland Kitchen COPD (chronic obstructive pulmonary disease) ([place])   . CTS (carpal tunnel syndrome)   . Depression   . GERD (gastroesophageal reflux disease)   . Migraines   . Neck pain   . Nontoxic multinodular goiter   . Other and unspecified hyperlipidemia   . Other and unspecified ovarian cyst    left  . Other chest pain   . PAF (paroxysmal atrial fibrillation) ([place])    a. 07/2007 Echo: EF 65%, mildly dil LA;  b. currently on propafenone;  c. CHA2DS2VASc = 1 (gender)-->No anticoagulation.  Marland Kitchen PTSD (post-traumatic stress disorder)   . Tobacco use   . Tobacco use disorder   . Unspecified hypothyroidism   . Unspecified sleep apnea    resolved s/p UPPP surgery  . Vitamin B12 deficiency     Past Surgical History:  Past Surgical History:  Procedure Laterality Date  . bladder tac    . CARDIAC CATHETERIZATION  08/12/06   neg. Dr. Humphrey Rolls, cardio  . CHOLECYSTECTOMY    . FOOT SURGERY     bone removal from her foot  . MRI lumber spine     03/12/06-abn  . MRI pelvis  05/02/06   abn-[place], neurosurg  . NASAL POLYP SURGERY     nasal ?  . PARATHYROIDECTOMY    . stress cardiolite  10/06/96   nl, EF 62%  . stress myoview  10/25/03   Dr. Chancy Milroy, nl  . THYROIDECTOMY  5/92   massive goiter  . TONSILLECTOMY    . TUBAL LIGATION  1988  . UVULOPALATOPHARYNGOPLASTY     and tonsilectomy. Gertie BaronMadison Clark 10/04    Gynecologic History:  No LMP recorded. Patient is postmenopausal. Last Pap: Results were: unknown Last mammogram: unknown   Obstetric History: [id number]  Family History:  Family History  Problem Relation Age of Onset  . Stroke Father   . Heart disease Mother   . Atrial fibrillation Mother   . Congestive Heart Failure Mother   . Atrial fibrillation Sister        s/p ablation  . Hyperlipidemia Daughter   . Heart  disease Daughter        leaky valve  . Atrial fibrillation Other   . Heart attack Other        both sides  . Stroke Other        both sides  . Depression Other        both sides  . Prostate cancer Neg Hx   . Colon cancer Neg Hx     Social History:  Social History   Socioeconomic History  . Marital status: Widowed    Spouse name: Not on file  . Number of children: Not on file  . Years of education: Not on file  . Highest education level: Not on file  Occupational History  . Not on file  Social Needs  . Financial resource strain: Not on file  . Food insecurity    Worry: Not on file    Inability: Not on file  . Transportation needs    Medical: Not on file    Non-medical: Not on file  Tobacco Use  . Smoking status: Former Smoker    Packs/day: 0.50    Years: 20.00    Pack years: 10.00    Types: Cigarettes    Quit date: 09/30/2017    Years since quitting: 1.1  . Smokeless tobacco: Never Used  Substance and Sexual Activity  . Alcohol use: Yes    Alcohol/week: 0.0 standard drinks    Comment: Rare  . Drug use: No  . Sexual activity: Not Currently    Birth control/protection: Post-menopausal  Lifestyle  . Physical activity    Days per week: Not on file    Minutes per session: Not on file  . Stress: Not on file  Relationships  . Social Musicianconnections    Talks on phone: Not on file    Gets together: Not on file    Attends religious service: Not on file    Active member of club or organization: Not on file    Attends meetings of clubs or organizations: Not on file    Relationship status: Not on file  . Intimate partner violence    Fear of current or ex partner: Not on file    Emotionally abused: Not on file    Physically abused: Not on file    Forced sexual activity: Not on file  Other Topics Concern  . Not on file  Social History Narrative   Widowed. 2 children, 1 granddaughter   Production designer, theatre/television/filmManager at [employer] [place]     Allergies:  Allergies  Allergen Reactions  . Bee Venom  Anaphylaxis  . Penicillins Rash  . Sulfonamide Derivatives Rash  . Cardizem  [Diltiazem Hcl] Other (See Comments)    Other Reaction: severe hypotension w/ IV dose  . Sulfa Antibiotics Hives  . Tape Rash    Uncoded Allergy. Allergen: tape & bandaids  Medications: Prior to Admission medications   Medication Sig Start Date End Date Taking? Authorizing Provider  citalopram (CELEXA) 20 MG tablet Take 1 tablet (20 mg total) by mouth daily. 11/03/18  Yes Steele Sizerrissman, Mark A, MD  levothyroxine (SYNTHROID) 125 MCG tablet Take 1 tablet (125 mcg total) by mouth daily with breakfast. 11/03/18  Yes Crissman, Redge GainerMark A, MD  pantoprazole (PROTONIX) 20 MG tablet Take 1 tablet (20 mg total) by mouth daily. 11/03/18  Yes Crissman, Redge GainerMark A, MD  propafenone (RYTHMOL) 225 MG tablet Take 225 mg by mouth as needed. Afib   Yes [provider]    Physical Exam Vitals: Blood pressure 140/88, pulse (!) 104, height 5\' 5"  (1.651 m), weight 218 lb (98.9 kg).  General: NAD HEENT: normocephalic, anicteric Thyroid: no enlargement, no palpable nodules Pulmonary: No increased work of breathing, CTAB Cardiovascular: RRR, distal pulses 2+ Breast: Breast symmetrical, no tenderness, no palpable nodules or masses, no skin or nipple retraction present, no nipple discharge.  No axillary or supraclavicular lymphadenopathy. Abdomen: NABS, soft, non-tender, non-distended.  Umbilicus without lesions.  No hepatomegaly, splenomegaly or masses palpable. No evidence of hernia  Genitourinary:  External: Normal external female genitalia.  Normal urethral meatus, normal Bartholin's and Skene's glands.    Vagina: Normal vaginal mucosa, no evidence of prolapse.  No mesh ersosion seen. Can palpate the area that is abrasive, but the mucosa is intact.   Cervix: Grossly normal in appearance, no bleeding  Uterus: Non-enlarged, mobile, normal contour.  No CMT  Adnexa: ovaries non-enlarged, no adnexal masses  Rectal: deferred  Lymphatic: no  evidence of inguinal lymphadenopathy Extremities: no edema, erythema, or tenderness Neurologic: Grossly intact Psychiatric: mood appropriate, affect full  Female chaperone present for pelvic and breast  portions of the physical exam     Assessment: 63 y.o. [id number] routine annual exam  Plan: Problem List Items Addressed This Visit    None      1) Mammogram - recommend yearly screening mammogram.  Mammogram Was ordered today  2) STI screening  was offered and declined  3) ASCCP guidelines and rational discussed.  Patient opts for every 3 years screening interval  4) Osteoporosis  - per USPTF routine screening DEXA at age 63 - FRAX 10 year major fracture risk 6.2,  10 year hip fracture risk 0.5  Consider FDA-approved medical therapies in postmenopausal women and men aged 63 years and older, based on the following: a) A hip or vertebral (clinical or morphometric) fracture b) T-score ? -2.5 at the femoral neck or spine after appropriate evaluation to exclude secondary causes C) Low bone mass (T-score between -1.0 and -2.5 at the femoral neck or spine) and a 10-year probability of a hip fracture ? 3% or a 10-year probability of a major osteoporosis-related fracture ? 20% based on the [place]-adapted WHO algorithm  5) Routine healthcare maintenance including cholesterol, diabetes screening discussed Declines  6) Colonoscopy declined.  Screening recommended starting at age 63 for average risk individuals, age 63 for individuals deemed at increased risk (including African Americans) and recommended to continue until age 63.  For patient age 63-85 individualized approach is recommended.  Gold standard screening is via colonoscopy, Cologuard screening is an acceptable alternative for patient unwilling or unable to undergo colonoscopy.  "Colorectal cancer screening for average?risk adults: 2018 guideline update from the American Cancer Society"CA: A Cancer Journal for Clinicians: Oct 03, 2016   7)  Return in about 1 year (around 11/13/2019) for annual.   MDL pap sent No  mesh erosion seen. Could palpate mesh like tissue- given premarin ointment to apply to area.  Discussed vaginal douche with half iodine half water once a week for bothersome vaginal discharge.  Follow up in 1-2 months if there is not improvement in symptoms.   Adelene Idlerhristanna Jasnoor Trussell MD [facility], [facility] 11/13/2018 4:32 PM

## 2018-11-18 ENCOUNTER — Telehealth: Payer: Self-pay

## 2018-11-18 NOTE — Telephone Encounter (Signed)
Pt calling to donfirm what she was told about iodine and water.  [phone] Pt aware half iodine and half water.

## 2019-11-20 ENCOUNTER — Encounter: Payer: Self-pay | Admitting: Family Medicine

## 2019-11-20 ENCOUNTER — Ambulatory Visit (INDEPENDENT_AMBULATORY_CARE_PROVIDER_SITE_OTHER): Payer: 59 | Admitting: Family Medicine

## 2019-11-20 ENCOUNTER — Other Ambulatory Visit: Payer: Self-pay

## 2019-11-20 ENCOUNTER — Other Ambulatory Visit
Admission: RE | Admit: 2019-11-20 | Discharge: 2019-11-20 | Disposition: A | Discharge status: Home / Self Care | Payer: 59 | Source: Ambulatory Visit | Attending: Family Medicine | Admitting: Family Medicine

## 2019-11-20 VITALS — BP 155/108 | HR 87 | Temp 98.9°F | Ht 63.39 in | Wt 220.0 lb

## 2019-11-20 DIAGNOSIS — E039 Hypothyroidism, unspecified: Secondary | ICD-10-CM

## 2019-11-20 DIAGNOSIS — E782 Mixed hyperlipidemia: Secondary | ICD-10-CM

## 2019-11-20 DIAGNOSIS — Z1159 Encounter for screening for other viral diseases: Secondary | ICD-10-CM | POA: Diagnosis present

## 2019-11-20 DIAGNOSIS — F331 Major depressive disorder, recurrent, moderate: Secondary | ICD-10-CM | POA: Diagnosis present

## 2019-11-20 DIAGNOSIS — Z114 Encounter for screening for human immunodeficiency virus [HIV]: Secondary | ICD-10-CM | POA: Diagnosis present

## 2019-11-20 DIAGNOSIS — I4891 Unspecified atrial fibrillation: Secondary | ICD-10-CM | POA: Diagnosis not present

## 2019-11-20 DIAGNOSIS — J449 Chronic obstructive pulmonary disease, unspecified: Secondary | ICD-10-CM

## 2019-11-20 DIAGNOSIS — G4733 Obstructive sleep apnea (adult) (pediatric): Secondary | ICD-10-CM

## 2019-11-20 DIAGNOSIS — Z23 Encounter for immunization: Secondary | ICD-10-CM

## 2019-11-20 DIAGNOSIS — F411 Generalized anxiety disorder: Secondary | ICD-10-CM

## 2019-11-20 DIAGNOSIS — Z1231 Encounter for screening mammogram for malignant neoplasm of breast: Secondary | ICD-10-CM

## 2019-11-20 DIAGNOSIS — R32 Unspecified urinary incontinence: Secondary | ICD-10-CM

## 2019-11-20 DIAGNOSIS — R140 Abdominal distension (gaseous): Secondary | ICD-10-CM | POA: Diagnosis not present

## 2019-11-20 DIAGNOSIS — K219 Gastro-esophageal reflux disease without esophagitis: Secondary | ICD-10-CM

## 2019-11-20 LAB — CBC WITH DIFFERENTIAL/PLATELET
Abs Immature Granulocytes: 0.02 10*3/uL (ref 0.00–0.07)
Basophils Absolute: 0.1 10*3/uL (ref 0.0–0.1)
Basophils Relative: 1 %
Eosinophils Absolute: 0.1 10*3/uL (ref 0.0–0.5)
Eosinophils Relative: 1 %
HCT: 44.5 % (ref 36.0–46.0)
Hemoglobin: 15.3 g/dL — ABNORMAL HIGH (ref 12.0–15.0)
Immature Granulocytes: 0 %
Lymphocytes Relative: 39 %
Lymphs Abs: 4.3 10*3/uL — ABNORMAL HIGH (ref 0.7–4.0)
MCH: 30.8 pg (ref 26.0–34.0)
MCHC: 34.4 g/dL (ref 30.0–36.0)
MCV: 89.5 fL (ref 80.0–100.0)
Monocytes Absolute: 0.7 10*3/uL (ref 0.1–1.0)
Monocytes Relative: 6 %
Neutro Abs: 5.8 10*3/uL (ref 1.7–7.7)
Neutrophils Relative %: 53 %
Platelets: 246 10*3/uL (ref 150–400)
RBC: 4.97 MIL/uL (ref 3.87–5.11)
RDW: 13 % (ref 11.5–15.5)
WBC: 11.2 10*3/uL — ABNORMAL HIGH (ref 4.0–10.5)
nRBC: 0 % (ref 0.0–0.2)

## 2019-11-20 LAB — URINALYSIS, ROUTINE W REFLEX MICROSCOPIC
Bilirubin Urine: NEGATIVE
Glucose, UA: NEGATIVE mg/dL
Ketones, ur: NEGATIVE mg/dL
Nitrite: NEGATIVE
Protein, ur: NEGATIVE mg/dL
Specific Gravity, Urine: 1.003 — ABNORMAL LOW (ref 1.005–1.030)
pH: 6 (ref 5.0–8.0)

## 2019-11-20 LAB — COMPREHENSIVE METABOLIC PANEL
ALT: 17 U/L (ref 0–44)
AST: 20 U/L (ref 15–41)
Albumin: 4.4 g/dL (ref 3.5–5.0)
Alkaline Phosphatase: 78 U/L (ref 38–126)
Anion gap: 11 (ref 5–15)
BUN: 13 mg/dL (ref 8–23)
CO2: 29 mmol/L (ref 22–32)
Calcium: 9.6 mg/dL (ref 8.9–10.3)
Chloride: 102 mmol/L (ref 98–111)
Creatinine, Ser: 1.12 mg/dL — ABNORMAL HIGH (ref 0.44–1.00)
GFR calc Af Amer: >60 mL/min (ref >60)
GFR calc non Af Amer: 52 mL/min — ABNORMAL LOW (ref >60)
Glucose, Bld: 87 mg/dL (ref 70–99)
Potassium: 4.1 mmol/L (ref 3.5–5.1)
Sodium: 142 mmol/L (ref 135–145)
Total Bilirubin: 0.7 mg/dL (ref 0.3–1.2)
Total Protein: 7.5 g/dL (ref 6.5–8.1)

## 2019-11-20 LAB — TSH: TSH: 2.55 u[IU]/mL (ref 0.350–4.500)

## 2019-11-20 LAB — HIV ANTIBODY (ROUTINE TESTING W REFLEX): HIV Screen 4th Generation wRfx: NONREACTIVE

## 2019-11-20 LAB — LIPID PANEL
Cholesterol: 256 mg/dL — ABNORMAL HIGH (ref 0–200)
HDL: 41 mg/dL (ref >40)
LDL Cholesterol: 179 mg/dL — ABNORMAL HIGH (ref 0–99)
Total CHOL/HDL Ratio: 6.2 RATIO
Triglycerides: 182 mg/dL — ABNORMAL HIGH (ref <150)
VLDL: 36 mg/dL (ref 0–40)

## 2019-11-20 LAB — HEPATITIS C ANTIBODY: HCV Ab: NONREACTIVE

## 2019-11-20 MED ORDER — PANTOPRAZOLE SODIUM 20 MG PO TBEC: 20.0000 mg | DELAYED_RELEASE_TABLET | Freq: Every day | ORAL | 1 refills | Status: DC

## 2019-11-20 MED ORDER — CITALOPRAM HYDROBROMIDE 40 MG PO TABS: 40.0000 mg | ORAL_TABLET | Freq: Every day | ORAL | 1 refills | Status: DC

## 2019-11-20 NOTE — Progress Notes (Signed)
BP (!) 155/108 (BP Location: Left Arm, Patient Position: Sitting, Cuff Size: Normal)   Pulse 87   Temp 98.9 F (37.2 C) (Oral)   Ht 5' 3.39" (1.61 m)   Wt 220 lb (99.8 kg)   SpO2 95%   BMI 38.50 kg/m    Subjective:    [name], female    DOB: 10/09/1955, 64 y.o.   MRN: [id number]  HPI: [name] Blarelthea S Lashomb is a 64 y.o. female  Chief Complaint  Patient presents with  . Atrial Fibrillation    med refill - aware may need to contact cardiology  . Bloated  . Anxiety  . Depression  . Excessive Sweating  . Sleeping Problem  . Foot Swelling    left foot   Has not been doing well. Has been under a lot of stress. She is having a weird living situation. Has been under a huge amount of stress. She is currently working as a live in Social workernanny, and is enjoying it, but it has been stressful. She notes that she has been really hot and feeling really anxious recently. This started a few months ago. She also notes that she has been having abdominal bloating after she eats.  HYPOTHYROIDISM Thyroid control status:unknown Satisfied with current treatment? yes Medication side effects: no Medication compliance: excellent compliance Recent dose adjustment:no Fatigue: yes Cold intolerance: yes Heat intolerance: yes Weight gain: yes Weight loss: no Constipation: no Diarrhea/loose stools: no Palpitations: no Lower extremity edema: no Anxiety/depressed mood: yes  DEPRESSION Mood status: exacerbated Satisfied with current treatment?: no Symptom severity: moderate  Duration of current treatment : chronic Side effects: no Medication compliance: good compliance Psychotherapy/counseling: no  Depressed mood: yes Anxious mood: yes Anhedonia: no Significant weight loss or gain: no Insomnia: yes  Fatigue: yes Feelings of worthlessness or guilt: no Impaired concentration/indecisiveness: no Suicidal ideations: no Hopelessness: no Crying spells: no [facility] screen [facility]  11/22/2019 12/31/2017 10/23/2017 10/09/2017 09/06/2016  Decreased Interest 0 0 1 0 0  Down, Depressed, Hopeless 1 0 1 1 0  PHQ - 2 Score 1 0 2 1 0  Altered sleeping 1 1 2 2 1   Tired, decreased energy 1 1 2 2 1   Change in appetite 2 0 1 2 0  Feeling bad or failure about yourself  0 0 1 0 0  Trouble concentrating 1 0 1 0 0  Moving slowly or fidgety/restless 0 0 0 1 0  Suicidal thoughts 0 - 0 0 0  PHQ-9 Score 6 2 9 8 2   Difficult doing work/chores Somewhat difficult - - - -   SLEEP APNEA- had surgery to open oral cavity, but has been having issues recently with being very tired Sleep apnea status: untreated Duration: chronic Satisfied with current treatment?:  no CPAP use:  no Sleep quality with CPAP use: N/A Last sleep study: years ago Treatments attempted: surgery, CPAP Wakes feeling refreshed:  no Daytime hypersomnolence:  yes Fatigue:  yes Insomnia:  yes Good sleep hygiene:  yes Difficulty falling asleep:  yes Difficulty staying asleep:  yes Snoring bothers bed partner:  no Observed apnea by bed partner: no Obesity:  yes Hypertension: yes  Pulmonary hypertension:  no Coronary artery disease:  no  Relevant past medical, surgical, family and social history reviewed and updated as indicated. Interim medical history since our last visit reviewed. Allergies and medications reviewed and updated.  Review of Systems  Constitutional: Positive for fatigue. Negative for activity change, appetite change, chills, diaphoresis, fever  and unexpected weight change.  HENT: Negative.   Respiratory: Negative.   Cardiovascular: Positive for leg swelling. Negative for chest pain and palpitations.  Gastrointestinal: Positive for abdominal distention. Negative for abdominal pain, anal bleeding, blood in stool, constipation, diarrhea, nausea, rectal pain and vomiting.  Musculoskeletal: Negative.   Skin: Negative.   Neurological: Negative.   Psychiatric/Behavioral: Positive for dysphoric mood and  sleep disturbance. Negative for agitation, behavioral problems, confusion, decreased concentration, hallucinations, self-injury and suicidal ideas. The patient is nervous/anxious. The patient is not hyperactive.     Per HPI unless specifically indicated above     Objective:    BP (!) 155/108 (BP Location: Left Arm, Patient Position: Sitting, Cuff Size: Normal)   Pulse 87   Temp 98.9 F (37.2 C) (Oral)   Ht 5' 3.39" (1.61 m)   Wt 220 lb (99.8 kg)   SpO2 95%   BMI 38.50 kg/m   Wt Readings from Last 3 Encounters:  11/20/19 220 lb (99.8 kg)  11/13/18 218 lb (98.9 kg)  06/02/18 212 lb 12.8 oz (96.5 kg)    Physical Exam Vitals and nursing note reviewed.  Constitutional:      General: She is not in acute distress.    Appearance: Normal appearance. She is not ill-appearing, toxic-appearing or diaphoretic.  HENT:     Head: Normocephalic and atraumatic.     Right Ear: External ear normal.     Left Ear: External ear normal.     Nose: Nose normal.     Mouth/Throat:     Mouth: Mucous membranes are moist.     Pharynx: Oropharynx is clear.  Eyes:     General: No scleral icterus.       Right eye: No discharge.        Left eye: No discharge.     Extraocular Movements: Extraocular movements intact.     Conjunctiva/sclera: Conjunctivae normal.     Pupils: Pupils are equal, round, and reactive to light.  Cardiovascular:     Rate and Rhythm: Normal rate and regular rhythm.     Pulses: Normal pulses.     Heart sounds: Normal heart sounds. No murmur heard.  No friction rub. No gallop.   Pulmonary:     Effort: Pulmonary effort is normal. No respiratory distress.     Breath sounds: Normal breath sounds. No stridor. No wheezing, rhonchi or rales.  Chest:     Chest wall: No tenderness.  Musculoskeletal:        General: Normal range of motion.     Cervical back: Normal range of motion and neck supple.  Skin:    General: Skin is warm and dry.     Capillary Refill: Capillary refill takes  less than 2 seconds.     Coloration: Skin is not jaundiced or pale.     Findings: No bruising, erythema, lesion or rash.  Neurological:     General: No focal deficit present.     Mental Status: She is alert and oriented to person, place, and time. Mental status is at baseline.  Psychiatric:        Mood and Affect: Mood normal.        Behavior: Behavior normal.        Thought Content: Thought content normal.        Judgment: Judgment normal.     Results for orders placed or performed during the hospital encounter of 10/09/17  CBC  Result Value Ref Range   WBC 11.3 (H) 3.6 -  11.0 K/uL   RBC 4.87 3.80 - 5.20 MIL/uL   Hemoglobin 15.1 12.0 - 16.0 g/dL   HCT 16.1 35 - 47 %   MCV 92.0 80.0 - 100.0 fL   MCH 30.9 26.0 - 34.0 pg   MCHC 33.6 32.0 - 36.0 g/dL   RDW 09.6 04.5 - 40.9 %   Platelets 251 150 - 440 K/uL  Comprehensive metabolic panel  Result Value Ref Range   Sodium 139 135 - 145 mmol/L   Potassium 3.5 3.5 - 5.1 mmol/L   Chloride 104 101 - 111 mmol/L   CO2 25 22 - 32 mmol/L   Glucose, Bld 115 (H) 65 - 99 mg/dL   BUN 12 6 - 20 mg/dL   Creatinine, Ser 8.11 (H) 0.44 - 1.00 mg/dL   Calcium 9.4 8.9 - 91.4 mg/dL   Total Protein 7.4 6.5 - 8.1 g/dL   Albumin 4.2 3.5 - 5.0 g/dL   AST 24 15 - 41 U/L   ALT 15 14 - 54 U/L   Alkaline Phosphatase 75 38 - 126 U/L   Total Bilirubin 0.4 0.3 - 1.2 mg/dL   GFR calc non Af Amer 58 (L) >60 mL/min   GFR calc Af Amer >60 >60 mL/min   Anion gap 10 5 - 15  Lipid panel  Result Value Ref Range   Cholesterol 238 (H) 0 - 200 mg/dL   Triglycerides 782 (H) <150 mg/dL   HDL 45 >95 mg/dL   Total CHOL/HDL Ratio 5.3 RATIO   VLDL 30 0 - 40 mg/dL   LDL Cholesterol 621 (H) 0 - 99 mg/dL  TSH  Result Value Ref Range   TSH 1.667 0.350 - 4.500 uIU/mL  Urinalysis, Complete w Microscopic  Result Value Ref Range   Color, Urine YELLOW (A) YELLOW   APPearance HAZY (A) CLEAR   Specific Gravity, Urine 1.013 1.005 - 1.030   pH 5.0 5.0 - 8.0   Glucose, UA  NEGATIVE NEGATIVE mg/dL   Hgb urine dipstick SMALL (A) NEGATIVE   Bilirubin Urine NEGATIVE NEGATIVE   Ketones, ur NEGATIVE NEGATIVE mg/dL   Protein, ur NEGATIVE NEGATIVE mg/dL   Nitrite NEGATIVE NEGATIVE   Leukocytes, UA SMALL (A) NEGATIVE   RBC / HPF 0-5 0 - 5 RBC/hpf   WBC, UA 6-10 0 - 5 WBC/hpf   Bacteria, UA NONE SEEN NONE SEEN   Squamous Epithelial / LPF 0-5 0 - 5      Assessment & Plan:   Problem List Items Addressed This Visit      Cardiovascular and Mediastinum   Atrial fibrillation (HCC) - Primary    Will get back in with her cardiologist. Continue to monitor. Call with any concerns. Referral generated today.      Relevant Orders   CBC with Differential/Platelet (Completed)   Comprehensive metabolic panel (Completed)   Ambulatory referral to Cardiology     Respiratory   COPD (chronic obstructive pulmonary disease) (HCC)    Stable off medicine. Continue to monitor. Call with any concerns.       Relevant Orders   CBC with Differential/Platelet (Completed)   Comprehensive metabolic panel (Completed)   OSA (obstructive sleep apnea)    Has not been using her CPAP- likely contributing to her symptoms. Will get sleep study. Await results.       Relevant Orders   Ambulatory referral to Sleep Studies     Digestive   GERD (gastroesophageal reflux disease)    Under good control on current regimen. Continue  current regimen. Continue to monitor. Call with any concerns. Refills given. Labs drawn today.       Relevant Medications   pantoprazole (PROTONIX) 20 MG tablet     Endocrine   Hypothyroidism    Rechecking labs today. Await results. Treat as needed.       Relevant Orders   CBC with Differential/Platelet (Completed)   Comprehensive metabolic panel (Completed)   TSH (Completed)     Other   Hyperlipidemia    Rechecking labs today. Await results. Treat as needed.       Relevant Orders   Lipid Panel w/o Chol/HDL Ratio   CBC with Differential/Platelet  (Completed)   Comprehensive metabolic panel (Completed)   Lipid Profile (Completed)   Anxiety state    Slightly exacerbated. Will increase her celexa to 40mg  and recheck at follow up. Call with any concerns.       Relevant Medications   citalopram (CELEXA) 40 MG tablet   Depression, major, recurrent, moderate (HCC)    Slightly exacerbated. Will increase her celexa to 40mg  and recheck at follow up. Call with any concerns.       Relevant Medications   citalopram (CELEXA) 40 MG tablet   Other Relevant Orders   CBC with Differential/Platelet (Completed)   Comprehensive metabolic panel (Completed)    Other Visit Diagnoses    Bloating       Will treat GERD, check labs and check sleep study. Await results.    Relevant Orders   Lipase   Amylase   Urinary incontinence, unspecified type       Labs drawn today. Await results.    Relevant Orders   CBC with Differential/Platelet (Completed)   Comprehensive metabolic panel (Completed)   Urinalysis, Routine w reflex microscopic (Completed)   Need for hepatitis C screening test       Labs drawn today. Await results.    Relevant Orders   Hepatitis C Antibody (Completed)   Screening for HIV without presence of risk factors       Labs drawn today. Await results.    Relevant Orders   HIV Antibody (routine testing w rflx) (Completed)   Encounter for screening mammogram for malignant neoplasm of breast       Mammogram ordered today. Await results.    Relevant Orders   MM 3D SCREEN BREAST BILATERAL       Follow up plan: Return in about 4 weeks (around 12/18/2019) for Physical.

## 2019-11-20 NOTE — Patient Instructions (Addendum)
Call to schedule your mammogram: [facility]  Address: [address], [place], [place] [address]  Phone: [phone]  

## 2019-11-22 NOTE — Assessment & Plan Note (Signed)
Rechecking labs today. Await results. Treat as needed.  °

## 2019-11-22 NOTE — Assessment & Plan Note (Signed)
Has not been using her CPAP- likely contributing to her symptoms. Will get sleep study. Await results.

## 2019-11-22 NOTE — Assessment & Plan Note (Signed)
Slightly exacerbated. Will increase her celexa to 40mg and recheck at follow up. Call with any concerns.  

## 2019-11-22 NOTE — Assessment & Plan Note (Signed)
Stable off medicine. Continue to monitor. Call with any concerns.  

## 2019-11-22 NOTE — Assessment & Plan Note (Signed)
Under good control on current regimen. Continue current regimen. Continue to monitor. Call with any concerns. Refills given. Labs drawn today.   

## 2019-11-22 NOTE — Assessment & Plan Note (Signed)
Slightly exacerbated. Will increase her celexa to 40mg  and recheck at follow up. Call with any concerns.

## 2019-11-22 NOTE — Assessment & Plan Note (Addendum)
Will get back in with her cardiologist. Continue to monitor. Call with any concerns. Referral generated today.

## 2019-11-23 ENCOUNTER — Other Ambulatory Visit: Payer: Self-pay | Admitting: Family Medicine

## 2019-11-23 MED ORDER — LEVOTHYROXINE SODIUM 125 MCG PO TABS: 125.0000 ug | ORAL_TABLET | Freq: Every day | ORAL | 3 refills | Status: DC

## 2019-11-30 NOTE — Telephone Encounter (Addendum)
Called patient to notified of paperwork ready for pick up. LVM. Form at the pick up box

## 2019-12-10 ENCOUNTER — Ambulatory Visit
Admission: RE | Admit: 2019-12-10 | Discharge: 2019-12-10 | Disposition: A | Discharge status: Home / Self Care | Payer: 59 | Source: Ambulatory Visit | Attending: Family Medicine | Admitting: Family Medicine

## 2019-12-10 ENCOUNTER — Other Ambulatory Visit: Payer: Self-pay

## 2019-12-10 DIAGNOSIS — Z1231 Encounter for screening mammogram for malignant neoplasm of breast: Secondary | ICD-10-CM | POA: Insufficient documentation

## 2019-12-21 ENCOUNTER — Other Ambulatory Visit: Payer: Self-pay

## 2019-12-21 ENCOUNTER — Ambulatory Visit (INDEPENDENT_AMBULATORY_CARE_PROVIDER_SITE_OTHER): Payer: 59 | Admitting: Family Medicine

## 2019-12-21 ENCOUNTER — Encounter: Payer: Self-pay | Admitting: Family Medicine

## 2019-12-21 VITALS — BP 132/83 | HR 75 | Temp 98.8°F | Ht 63.58 in | Wt 217.0 lb

## 2019-12-21 DIAGNOSIS — R140 Abdominal distension (gaseous): Secondary | ICD-10-CM | POA: Diagnosis not present

## 2019-12-21 DIAGNOSIS — F331 Major depressive disorder, recurrent, moderate: Secondary | ICD-10-CM | POA: Diagnosis not present

## 2019-12-21 DIAGNOSIS — B354 Tinea corporis: Secondary | ICD-10-CM

## 2019-12-21 DIAGNOSIS — Z0000 Encounter for general adult medical examination without abnormal findings: Secondary | ICD-10-CM

## 2019-12-21 DIAGNOSIS — E782 Mixed hyperlipidemia: Secondary | ICD-10-CM

## 2019-12-21 MED ORDER — CLOTRIMAZOLE-BETAMETHASONE 1-0.05 % EX CREA: 1.0000 "application " | TOPICAL_CREAM | Freq: Two times a day (BID) | CUTANEOUS | 1 refills | Status: DC

## 2019-12-21 MED ORDER — ATORVASTATIN CALCIUM 40 MG PO TABS: 40.0000 mg | ORAL_TABLET | Freq: Every day | ORAL | 1 refills | Status: DC

## 2019-12-21 NOTE — Assessment & Plan Note (Signed)
Will start atorvastatin. Call with any concerns.

## 2019-12-21 NOTE — Progress Notes (Signed)
BP 132/83 (BP Location: Left Arm, Patient Position: Sitting, Cuff Size: Normal)   Pulse 75   Temp 98.8 F (37.1 C) (Oral)   Ht 5' 3.58" (1.615 m)   Wt 217 lb (98.4 kg)   SpO2 99%   BMI 37.74 kg/m    Subjective:    [name], female    DOB: 1956-02-18, 64 y.o.   MRN: [id number]  HPI: [name] is a 64 y.o. female presenting on 12/21/2019 for comprehensive medical examination. Current medical complaints include:  ANXIETY/STRESS Duration: chronic Status:better Anxious mood: yes  Excessive worrying: no Irritability: no  Sweating: no Nausea: no Palpitations:no Hyperventilation: no Panic attacks: no Agoraphobia: no  Obscessions/compulsions: no Depressed mood: yes Depression screen [facility] 2/9 11/22/2019 12/31/2017 10/23/2017 10/09/2017 09/06/2016  Decreased Interest 0 0 1 0 0  Down, Depressed, Hopeless 1 0 1 1 0  PHQ - 2 Score 1 0 2 1 0  Altered sleeping 1 1 2 2 1   Tired, decreased energy 1 1 2 2 1   Change in appetite 2 0 1 2 0  Feeling bad or failure about yourself  0 0 1 0 0  Trouble concentrating 1 0 1 0 0  Moving slowly or fidgety/restless 0 0 0 1 0  Suicidal thoughts 0 - 0 0 0  PHQ-9 Score 6 2 9 8 2   Difficult doing work/chores Somewhat difficult - - - -   Anhedonia: no Weight changes: yes Insomnia: yes   Hypersomnia: no Fatigue/loss of energy: yes Feelings of worthlessness: no Feelings of guilt: no Impaired concentration/indecisiveness: no Suicidal ideations: no  Crying spells: no Recent Stressors/Life Changes: yes   Relationship problems: no   Family stress: no     Financial stress: yes    Job stress: yes    Recent death/loss: no  Menopausal Symptoms: no  Depression Screen done today and results listed below:  Depression screen [facility] 2/9 11/22/2019 12/31/2017 10/23/2017 10/09/2017 09/06/2016  Decreased Interest 0 0 1 0 0  Down, Depressed, Hopeless 1 0 1 1 0  PHQ - 2 Score 1 0 2 1 0  Altered sleeping 1 1 2 2 1   Tired, decreased energy 1 1 2 2  1   Change in appetite 2 0 1 2 0  Feeling bad or failure about yourself  0 0 1 0 0  Trouble concentrating 1 0 1 0 0  Moving slowly or fidgety/restless 0 0 0 1 0  Suicidal thoughts 0 - 0 0 0  PHQ-9 Score 6 2 9 8 2   Difficult doing work/chores Somewhat difficult - - - -   Past Medical History:  Past Medical History:  Diagnosis Date  . Anxiety state, unspecified   . Back pain   . Chest pain    a. 2008 Cath: reportedly nl;  b. 09/2012 Lexi MV: EF 75%, soft tissue attenuation->Low risk.  11/06/2016 COPD (chronic obstructive pulmonary disease) (HCC)   . CTS (carpal tunnel syndrome)   . Depression   . GERD (gastroesophageal reflux disease)   . Migraines   . Neck pain   . Nontoxic multinodular goiter   . Other and unspecified hyperlipidemia   . Other and unspecified ovarian cyst    left  . Other chest pain   . PAF (paroxysmal atrial fibrillation) (HCC)    a. 07/2007 Echo: EF 65%, mildly dil LA;  b. currently on propafenone;  c. CHA2DS2VASc = 1 (gender)-->No anticoagulation.  PTSD (post-traumatic stress disorder)   . Tobacco use   .  Tobacco use disorder   . Unspecified hypothyroidism   . Unspecified sleep apnea    resolved s/p UPPP surgery  . Vitamin B12 deficiency     Surgical History:  Past Surgical History:  Procedure Laterality Date  . bladder tac    . CARDIAC CATHETERIZATION  08/12/06   neg. Dr. Welton Flakes, cardio  . CHOLECYSTECTOMY    . FOOT SURGERY     bone removal from her foot  . MRI lumber spine     03/12/06-abn  . MRI pelvis  05/02/06   abn-[facility], neurosurg  . NASAL POLYP SURGERY     nasal ?  . PARATHYROIDECTOMY    . stress cardiolite  10/06/96   nl, EF 62%  . stress myoview  10/25/03   Dr. Park Breed, nl  . THYROIDECTOMY  5/92   massive goiter  . TONSILLECTOMY    . TUBAL LIGATION  1988  . UVULOPALATOPHARYNGOPLASTY     and tonsilectomy. [name] 10/04    Medications:  Current Outpatient Medications on File Prior to Visit  Medication Sig  . citalopram (CELEXA) 40 MG  tablet Take 1 tablet (40 mg total) by mouth daily.  Marland Kitchen levothyroxine (SYNTHROID) 125 MCG tablet Take 1 tablet (125 mcg total) by mouth daily with breakfast.  . pantoprazole (PROTONIX) 20 MG tablet Take 1 tablet (20 mg total) by mouth daily.  . propafenone (RYTHMOL) 225 MG tablet Take 225 mg by mouth as needed. Afib (Patient not taking: Reported on 11/20/2019)   No current facility-administered medications on file prior to visit.    Allergies:  Allergies  Allergen Reactions  . Bee Venom Anaphylaxis  . Penicillins Rash  . Sulfonamide Derivatives Rash  . Cardizem  [Diltiazem Hcl] Other (See Comments)    Other Reaction: severe hypotension w/ IV dose  . Sulfa Antibiotics Hives  . Tape Rash    Uncoded Allergy. Allergen: tape & bandaids    Social History:  Social History   Socioeconomic History  . Marital status: Widowed    Spouse name: Not on file  . Number of children: Not on file  . Years of education: Not on file  . Highest education level: Not on file  Occupational History  . Not on file  Tobacco Use  . Smoking status: Former Smoker    Packs/day: 0.50    Years: 20.00    Pack years: 10.00    Types: Cigarettes    Quit date: 09/30/2017    Years since quitting: 2.2  . Smokeless tobacco: Never Used  Vaping Use  . Vaping Use: Some days  Substance and Sexual Activity  . Alcohol use: Yes    Alcohol/week: 0.0 standard drinks    Comment: Rare  . Drug use: No  . Sexual activity: Not Currently    Birth control/protection: Post-menopausal  Other Topics Concern  . Not on file  Social History Narrative   Widowed. 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    Social Determinants of Health   Financial Resource Strain:   . Difficulty of Paying Living Expenses:   Food Insecurity:   . Worried About Programme researcher, broadcasting/film/video in the Last Year:   . Barista in the Last Year:   Transportation Needs:   . Freight forwarder (Medical):   Marland Kitchen Lack of Transportation  (Non-Medical):   Physical Activity:   . Days of Exercise per Week:   . Minutes of Exercise per Session:   Stress:   . Feeling of Stress :  Social Connections:   . Frequency of Communication with Friends and Family:   . Frequency of Social Gatherings with Friends and Family:   . Attends Religious Services:   . Active Member of Clubs or Organizations:   . Attends Banker Meetings:   Marland Kitchen Marital Status:   Intimate Partner Violence:   . Fear of Current or Ex-Partner:   . Emotionally Abused:   Marland Kitchen Physically Abused:   . Sexually Abused:    Social History   Tobacco Use  Smoking Status Former Smoker  . Packs/day: 0.50  . Years: 20.00  . Pack years: 10.00  . Types: Cigarettes  . Quit date: 09/30/2017  . Years since quitting: 2.2  Smokeless Tobacco Never Used   Social History   Substance and Sexual Activity  Alcohol Use Yes  . Alcohol/week: 0.0 standard drinks   Comment: Rare    Family History:  Family History  Problem Relation Age of Onset  . Stroke Father   . Heart disease Mother   . Atrial fibrillation Mother   . Congestive Heart Failure Mother   . Atrial fibrillation Sister        s/p ablation  . Breast cancer Sister 14  . Hyperlipidemia Daughter   . Heart disease Daughter        leaky valve  . Atrial fibrillation Other   . Heart attack Other        both sides  . Stroke Other        both sides  . Depression Other        both sides  . Prostate cancer Neg Hx   . Colon cancer Neg Hx     Past medical history, surgical history, medications, allergies, family history and social history reviewed with patient today and changes made to appropriate areas of the chart.   Review of Systems  Constitutional: Positive for diaphoresis. Negative for chills, fever, malaise/fatigue and weight loss.  HENT: Negative.   Eyes: Negative.   Respiratory: Positive for shortness of breath. Negative for cough, hemoptysis, sputum production and wheezing.   Cardiovascular:  Positive for palpitations. Negative for chest pain, orthopnea, claudication, leg swelling and PND.  Gastrointestinal: Positive for diarrhea. Negative for abdominal pain, blood in stool, constipation, heartburn, melena, nausea and vomiting.       Bloating has continued  Genitourinary: Negative.   Musculoskeletal: Positive for joint pain and myalgias. Negative for back pain, falls and neck pain.  Skin: Positive for rash (about 3 weeks). Negative for itching.  Neurological: Negative.   Endo/Heme/Allergies: Negative.  Negative for environmental allergies and polydipsia. Does not bruise/bleed easily.  Psychiatric/Behavioral: Negative.     All other ROS negative except what is listed above and in the HPI.      Objective:    BP 132/83 (BP Location: Left Arm, Patient Position: Sitting, Cuff Size: Normal)   Pulse 75   Temp 98.8 F (37.1 C) (Oral)   Ht 5' 3.58" (1.615 m)   Wt 217 lb (98.4 kg)   SpO2 99%   BMI 37.74 kg/m   Wt Readings from Last 3 Encounters:  12/21/19 217 lb (98.4 kg)  11/20/19 220 lb (99.8 kg)  11/13/18 218 lb (98.9 kg)    Physical Exam Vitals and nursing note reviewed.  Constitutional:      General: She is not in acute distress.    Appearance: Normal appearance. She is not ill-appearing, toxic-appearing or diaphoretic.  HENT:     Head: Normocephalic and atraumatic.  Right Ear: Tympanic membrane, ear canal and external ear normal. There is no impacted cerumen.     Left Ear: Tympanic membrane, ear canal and external ear normal. There is no impacted cerumen.     Nose: Nose normal. No congestion or rhinorrhea.     Mouth/Throat:     Mouth: Mucous membranes are moist.     Pharynx: Oropharynx is clear. No oropharyngeal exudate or posterior oropharyngeal erythema.  Eyes:     General: No scleral icterus.       Right eye: No discharge.        Left eye: No discharge.     Extraocular Movements: Extraocular movements intact.     Conjunctiva/sclera: Conjunctivae normal.      Pupils: Pupils are equal, round, and reactive to light.  Neck:     Vascular: No carotid bruit.  Cardiovascular:     Rate and Rhythm: Normal rate and regular rhythm.     Pulses: Normal pulses.     Heart sounds: No murmur heard.  No friction rub. No gallop.   Pulmonary:     Effort: Pulmonary effort is normal. No respiratory distress.     Breath sounds: Normal breath sounds. No stridor. No wheezing, rhonchi or rales.  Chest:     Chest wall: No tenderness.  Abdominal:     General: Abdomen is flat. Bowel sounds are normal. There is no distension.     Palpations: Abdomen is soft. There is no mass.     Tenderness: There is no abdominal tenderness. There is no right CVA tenderness, left CVA tenderness, guarding or rebound.     Hernia: No hernia is present.  Genitourinary:    Comments: Breast and pelvic exams deferred with shared decision making Musculoskeletal:        General: No swelling, tenderness, deformity or signs of injury.     Cervical back: Normal range of motion and neck supple. No rigidity. No muscular tenderness.     Right lower leg: No edema.     Left lower leg: No edema.  Lymphadenopathy:     Cervical: No cervical adenopathy.  Skin:    General: Skin is warm and dry.     Capillary Refill: Capillary refill takes less than 2 seconds.     Coloration: Skin is not jaundiced or pale.     Findings: No bruising, erythema, lesion or rash.     Comments: Raised erythematous patch on L shoulder  Neurological:     General: No focal deficit present.     Mental Status: She is alert and oriented to person, place, and time. Mental status is at baseline.     Cranial Nerves: No cranial nerve deficit.     Sensory: No sensory deficit.     Motor: No weakness.     Coordination: Coordination normal.     Gait: Gait normal.     Deep Tendon Reflexes: Reflexes normal.  Psychiatric:        Mood and Affect: Mood normal.        Behavior: Behavior normal.        Thought Content: Thought  content normal.        Judgment: Judgment normal.     Results for orders placed or performed during the hospital encounter of 11/20/19  Urinalysis, Routine w reflex microscopic  Result Value Ref Range   Color, Urine STRAW (A) YELLOW   APPearance HAZY (A) CLEAR   Specific Gravity, Urine 1.003 (L) 1.005 - 1.030   pH 6.0 5.0 -  8.0   Glucose, UA NEGATIVE NEGATIVE mg/dL   Hgb urine dipstick SMALL (A) NEGATIVE   Bilirubin Urine NEGATIVE NEGATIVE   Ketones, ur NEGATIVE NEGATIVE mg/dL   Protein, ur NEGATIVE NEGATIVE mg/dL   Nitrite NEGATIVE NEGATIVE   Leukocytes,Ua MODERATE (A) NEGATIVE   RBC / HPF 0-5 0 - 5 RBC/hpf   WBC, UA 21-50 0 - 5 WBC/hpf   Bacteria, UA RARE (A) NONE SEEN   Squamous Epithelial / LPF 0-5 0 - 5   Mucus PRESENT   Lipid Profile  Result Value Ref Range   Cholesterol 256 (H) 0 - 200 mg/dL   Triglycerides 161 (H) <150 mg/dL   HDL 41 >09 mg/dL   Total CHOL/HDL Ratio 6.2 RATIO   VLDL 36 0 - 40 mg/dL   LDL Cholesterol 604 (H) 0 - 99 mg/dL  Hepatitis C Antibody  Result Value Ref Range   HCV Ab NON REACTIVE NON REACTIVE  HIV Antibody (routine testing w rflx)  Result Value Ref Range   HIV Screen 4th Generation wRfx Non Reactive Non Reactive  TSH  Result Value Ref Range   TSH 2.550 0.350 - 4.500 uIU/mL  Comprehensive metabolic panel  Result Value Ref Range   Sodium 142 135 - 145 mmol/L   Potassium 4.1 3.5 - 5.1 mmol/L   Chloride 102 98 - 111 mmol/L   CO2 29 22 - 32 mmol/L   Glucose, Bld 87 70 - 99 mg/dL   BUN 13 8 - 23 mg/dL   Creatinine, Ser 5.40 (H) 0.44 - 1.00 mg/dL   Calcium 9.6 8.9 - 98.1 mg/dL   Total Protein 7.5 6.5 - 8.1 g/dL   Albumin 4.4 3.5 - 5.0 g/dL   AST 20 15 - 41 U/L   ALT 17 0 - 44 U/L   Alkaline Phosphatase 78 38 - 126 U/L   Total Bilirubin 0.7 0.3 - 1.2 mg/dL   GFR calc non Af Amer 52 (L) >60 mL/min   GFR calc Af Amer >60 >60 mL/min   Anion gap 11 5 - 15  CBC with Differential/Platelet  Result Value Ref Range   WBC 11.2 (H) 4.0 - 10.5  K/uL   RBC 4.97 3.87 - 5.11 MIL/uL   Hemoglobin 15.3 (H) 12.0 - 15.0 g/dL   HCT 19.1 36 - 46 %   MCV 89.5 80.0 - 100.0 fL   MCH 30.8 26.0 - 34.0 pg   MCHC 34.4 30.0 - 36.0 g/dL   RDW 47.8 29.5 - 62.1 %   Platelets 246 150 - 400 K/uL   nRBC 0.0 0.0 - 0.2 %   Neutrophils Relative % 53 %   Neutro Abs 5.8 1.7 - 7.7 K/uL   Lymphocytes Relative 39 %   Lymphs Abs 4.3 (H) 0.7 - 4.0 K/uL   Monocytes Relative 6 %   Monocytes Absolute 0.7 0 - 1 K/uL   Eosinophils Relative 1 %   Eosinophils Absolute 0.1 0 - 0 K/uL   Basophils Relative 1 %   Basophils Absolute 0.1 0 - 0 K/uL   Immature Granulocytes 0 %   Abs Immature Granulocytes 0.02 0.00 - 0.07 K/uL      Assessment & Plan:   Problem List Items Addressed This Visit      Other   Hyperlipidemia    Will start atorvastatin. Call with any concerns.       Relevant Medications   atorvastatin (LIPITOR) 40 MG tablet   Depression, major, recurrent, moderate (HCC)    Doing  much better on the higher dose of celexa. Call with any concerns. Continue to monitor.        Other Visit Diagnoses    Routine general medical examination at a health care facility    -  Primary   Vaccines up to date. Screening labs checked today. Pap and mammogram up to date. Colonoscopy ordered today. Continue diet and exercise.    Bloating       Not improving with PPI. Will get her into GI for evaluation. Call with any concerns. Continue to monitor.    Relevant Orders   Ambulatory referral to Gastroenterology   Tinea corporis       Will treat with lotrisone. Call with any concerns.    Relevant Medications   clotrimazole-betamethasone (LOTRISONE) cream       Follow up plan: Return in about 6 months (around 06/22/2020).   LABORATORY TESTING:  - Pap smear: up to date  IMMUNIZATIONS:   - Tdap: Tetanus vaccination status reviewed: last tetanus booster within 10 years. - Influenza: Postponed to flu season - COVID: Up to date  SCREENING: -Mammogram: Up to  date  - Colonoscopy: Refused   PATIENT COUNSELING:   Advised to take 1 mg of folate supplement per day if capable of pregnancy.   Sexuality: Discussed sexually transmitted diseases, partner selection, use of condoms, avoidance of unintended pregnancy  and contraceptive alternatives.   Advised to avoid cigarette smoking.  I discussed with the patient that most people either abstain from alcohol or drink within safe limits (<=14/week and <=4 drinks/occasion for males, <=7/weeks and <= 3 drinks/occasion for females) and that the risk for alcohol disorders and other health effects rises proportionally with the number of drinks per week and how often a drinker exceeds daily limits.  Discussed cessation/primary prevention of drug use and availability of treatment for abuse.   Diet: Encouraged to adjust caloric intake to maintain  or achieve ideal body weight, to reduce intake of dietary saturated fat and total fat, to limit sodium intake by avoiding high sodium foods and not adding table salt, and to maintain adequate dietary potassium and calcium preferably from fresh fruits, vegetables, and low-fat dairy products.    stressed the importance of regular exercise  Injury prevention: Discussed safety belts, safety helmets, smoke detector, smoking near bedding or upholstery.   Dental health: Discussed importance of regular tooth brushing, flossing, and dental visits.    NEXT PREVENTATIVE PHYSICAL DUE IN 1 YEAR. Return in about 6 months (around 06/22/2020).

## 2019-12-21 NOTE — Patient Instructions (Signed)

## 2019-12-21 NOTE — Assessment & Plan Note (Signed)
Doing much better on the higher dose of celexa. Call with any concerns. Continue to monitor.  

## 2019-12-25 ENCOUNTER — Telehealth: Payer: Self-pay | Admitting: Family Medicine

## 2019-12-25 NOTE — Telephone Encounter (Signed)
[facility] but they need the insurance info, a signed order and the OV notes / please fax asap to [phone]

## 2020-01-18 ENCOUNTER — Ambulatory Visit (INDEPENDENT_AMBULATORY_CARE_PROVIDER_SITE_OTHER): Payer: 59 | Admitting: Internal Medicine

## 2020-01-18 VITALS — BP 126/57 | HR 104 | Ht 63.0 in | Wt 215.0 lb

## 2020-01-18 DIAGNOSIS — G4733 Obstructive sleep apnea (adult) (pediatric): Secondary | ICD-10-CM

## 2020-01-18 DIAGNOSIS — I10 Essential (primary) hypertension: Secondary | ICD-10-CM | POA: Diagnosis not present

## 2020-01-18 DIAGNOSIS — G2581 Restless legs syndrome: Secondary | ICD-10-CM

## 2020-01-18 DIAGNOSIS — Z7189 Other specified counseling: Secondary | ICD-10-CM

## 2020-01-18 DIAGNOSIS — K219 Gastro-esophageal reflux disease without esophagitis: Secondary | ICD-10-CM | POA: Diagnosis not present

## 2020-01-18 NOTE — Patient Instructions (Signed)

## 2020-01-18 NOTE — Progress Notes (Signed)
Sleep Medicine   Office Visit  Patient Name: [name] DOB: 12/18/1955 MRN [id number]    Chief Complaint: sleep apnea  Brief History:  Ivie presents with a 10-15 year history of sleep apnea. Subsequently had a UPPP plus tonsillectomy. She did well for a while then gained about 70 lbs. She gurgles but does not snore due to the surgery. She wakes frequently and feels SOB she also wakes with reflux. She has morning headaches.  She goes to bed around 1:30 p.m. and wakes 5:30 a.m. most days.  she reports that her sleep quality is poor and she does not feel rested.  Patient has noted restlessness of her legs at night. This makes it difficult for her to sleep. The patient  Relates no unusual behavior during the night.  The patient reports a history of psychiatric problems. The Epworth Sleepiness Score is 6 out of 24 .     ROS  General: (-) fever, (-) chills, (-) night sweat Nose and Sinuses: (-) nasal stuffiness or itchiness, (-) postnasal drip, (-) nosebleeds, (-) sinus trouble. Mouth and Throat: (-) sore throat, (-) hoarseness. Neck: (-) swollen glands, (-) enlarged thyroid, (-) neck pain. Respiratory: - cough, - shortness of breath, - wheezing. Neurologic: - numbness, - tingling. Psychiatric: - anxiety, - depression Sleep behavior: -sleep paralysis -hypnogogic hallucinations -dream enactment -vivid dreams -cataplexy -night terrors -sleep walking   Current Medication: Outpatient Encounter Medications as of 01/18/2020  Medication Sig  . atorvastatin (LIPITOR) 40 MG tablet Take 1 tablet (40 mg total) by mouth daily.  . citalopram (CELEXA) 40 MG tablet Take 1 tablet (40 mg total) by mouth daily.  . clotrimazole-betamethasone (LOTRISONE) cream Apply 1 application topically 2 (two) times daily.  Marland Kitchen. levothyroxine (SYNTHROID) 125 MCG tablet Take 1 tablet (125 mcg total) by mouth daily with breakfast.  . pantoprazole (PROTONIX) 20 MG tablet Take 1 tablet (20 mg total) by mouth daily.  .  propafenone (RYTHMOL) 225 MG tablet Take 225 mg by mouth as needed. Afib (Patient not taking: Reported on 11/20/2019)   No facility-administered encounter medications on file as of 01/18/2020.    Surgical History: Past Surgical History:  Procedure Laterality Date  . bladder tac    . CARDIAC CATHETERIZATION  08/12/06   neg. Dr. Welton FlakesKhan, cardio  . CHOLECYSTECTOMY    . FOOT SURGERY     bone removal from her foot  . MRI lumber spine     03/12/06-abn  . MRI pelvis  05/02/06   abn-Bortero, neurosurg  . NASAL POLYP SURGERY     nasal ?  . PARATHYROIDECTOMY    . stress cardiolite  10/06/96   nl, EF 62%  . stress myoview  10/25/03   Dr. Park BreedKahn, nl  . THYROIDECTOMY  5/92   massive goiter  . TONSILLECTOMY    . TUBAL LIGATION  1988  . UVULOPALATOPHARYNGOPLASTY     and tonsilectomy. Gertie BaronMadison Clark 10/04    Medical History: Past Medical History:  Diagnosis Date  . Anxiety state, unspecified   . Back pain   . Chest pain    a. 2008 Cath: reportedly nl;  b. 09/2012 Lexi MV: EF 75%, soft tissue attenuation->Low risk.  Marland Kitchen. COPD (chronic obstructive pulmonary disease) (HCC)   . CTS (carpal tunnel syndrome)   . Depression   . GERD (gastroesophageal reflux disease)   . Migraines   . Neck pain   . Nontoxic multinodular goiter   . Other and unspecified hyperlipidemia   . Other and unspecified ovarian  cyst    left  . Other chest pain   . PAF (paroxysmal atrial fibrillation) (HCC)    a. 07/2007 Echo: EF 65%, mildly dil LA;  b. currently on propafenone;  c. CHA2DS2VASc = 1 (gender)-->No anticoagulation.  Marland Kitchen PTSD (post-traumatic stress disorder)   . Tobacco use   . Tobacco use disorder   . Unspecified hypothyroidism   . Unspecified sleep apnea    resolved s/p UPPP surgery  . Vitamin B12 deficiency     Family History: Non contributory to the present illness  Social History: Social History   Socioeconomic History  . Marital status: Widowed    Spouse name: Not on file  . Number of children: Not  on file  . Years of education: Not on file  . Highest education level: Not on file  Occupational History  . Not on file  Tobacco Use  . Smoking status: Former Smoker    Packs/day: 0.50    Years: 20.00    Pack years: 10.00    Types: Cigarettes    Quit date: 09/30/2017    Years since quitting: 2.3  . Smokeless tobacco: Never Used  Vaping Use  . Vaping Use: Some days  Substance and Sexual Activity  . Alcohol use: Yes    Alcohol/week: 0.0 standard drinks    Comment: Rare  . Drug use: No  . Sexual activity: Not Currently    Birth control/protection: Post-menopausal  Other Topics Concern  . Not on file  Social History Narrative   Widowed. 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    Social Determinants of Health   Financial Resource Strain:   . Difficulty of Paying Living Expenses: Not on file  Food Insecurity:   . Worried About Programme researcher, broadcasting/film/video in the Last Year: Not on file  . Ran Out of Food in the Last Year: Not on file  Transportation Needs:   . Lack of Transportation (Medical): Not on file  . Lack of Transportation (Non-Medical): Not on file  Physical Activity:   . Days of Exercise per Week: Not on file  . Minutes of Exercise per Session: Not on file  Stress:   . Feeling of Stress : Not on file  Social Connections:   . Frequency of Communication with Friends and Family: Not on file  . Frequency of Social Gatherings with Friends and Family: Not on file  . Attends Religious Services: Not on file  . Active Member of Clubs or Organizations: Not on file  . Attends Banker Meetings: Not on file  . Marital Status: Not on file  Intimate Partner Violence:   . Fear of Current or Ex-Partner: Not on file  . Emotionally Abused: Not on file  . Physically Abused: Not on file  . Sexually Abused: Not on file    Vital Signs: Blood pressure (!) 126/57, pulse (!) 104, height 5\' 3"  (1.6 m), weight 215 lb (97.5 kg), SpO2 96 %.  Examination: General  Appearance: The patient is well-developed, well-nourished, and in no distress. Neck Circumference: 41 Skin: Gross inspection of skin unremarkable. Head: normocephalic, no gross deformities. Eyes: no gross deformities noted. ENT: ears appear grossly normal Neurologic: Alert and oriented. No involuntary movements.    EPWORTH SLEEPINESS SCALE:  Scale:  (0)= no chance of dozing; (1)= slight chance of dozing; (2)= moderate chance of dozing; (3)= high chance of dozing  Chance  Situtation    Sitting and reading: 2    Watching TV: 1  Sitting Inactive in public: 1    As a passenger in car: 0      Lying down to rest: 2    Sitting and talking: 0    Sitting quielty after lunch: 0    In a car, stopped in traffic: 0   TOTAL SCORE:   6 out of 24    SLEEP STUDIES:  1. Reports not available   LABS: Recent Results (from the past 2160 hour(s))  Urinalysis, Routine w reflex microscopic     Status: Abnormal   Collection Time: 11/20/19  5:06 PM  Result Value Ref Range   Color, Urine STRAW (A) YELLOW   APPearance HAZY (A) CLEAR   Specific Gravity, Urine 1.003 (L) 1.005 - 1.030   pH 6.0 5.0 - 8.0   Glucose, UA NEGATIVE NEGATIVE mg/dL   Hgb urine dipstick SMALL (A) NEGATIVE   Bilirubin Urine NEGATIVE NEGATIVE   Ketones, ur NEGATIVE NEGATIVE mg/dL   Protein, ur NEGATIVE NEGATIVE mg/dL   Nitrite NEGATIVE NEGATIVE   Leukocytes,Ua MODERATE (A) NEGATIVE   RBC / HPF 0-5 0 - 5 RBC/hpf   WBC, UA 21-50 0 - 5 WBC/hpf   Bacteria, UA RARE (A) NONE SEEN   Squamous Epithelial / LPF 0-5 0 - 5   Mucus PRESENT     Comment: Performed at [facility], [address] [place], [place] [address]  Lipid Profile     Status: Abnormal   Collection Time: 11/20/19  5:10 PM  Result Value Ref Range   Cholesterol 256 (H) 0 - 200 mg/dL   Triglycerides 604 (H) <150 mg/dL   HDL 41 >54 mg/dL   Total CHOL/HDL Ratio 6.2 RATIO   VLDL 36 0 - 40 mg/dL   LDL Cholesterol 098 (H) 0 - 99 mg/dL     Comment:        Total Cholesterol/HDL:CHD Risk Coronary Heart Disease Risk Table                     Men   Women  1/2 Average Risk   3.4   3.3  Average Risk       5.0   4.4  2 X Average Risk   9.6   7.1  3 X Average Risk  23.4   11.0        Use the calculated Patient Ratio above and the CHD Risk Table to determine the patient's CHD Risk.        ATP III CLASSIFICATION (LDL):  <100     mg/dL   Optimal  119-147  mg/dL   Near or Above                    Optimal  130-159  mg/dL   Borderline  829-562  mg/dL   High  >130     mg/dL   Very High Performed at [facility], [address] [place], [place] [address]   Hepatitis C Antibody     Status: None   Collection Time: 11/20/19  5:10 PM  Result Value Ref Range   HCV Ab NON REACTIVE NON REACTIVE    Comment: (NOTE) Nonreactive HCV antibody screen is consistent with no HCV infections,  unless recent infection is suspected or other evidence exists to indicate HCV infection.  Performed at [facility], [address] [place], [place] [address]   HIV Antibody (routine testing w rflx)     Status: None   Collection Time: 11/20/19  5:10 PM  Result Value Ref Range   HIV Screen 4th Generation wRfx Non Reactive Non Reactive    Comment: Performed at [facility], [address], [place], [place] [address]  TSH     Status: None   Collection Time: 11/20/19  5:10 PM  Result Value Ref Range   TSH 2.550 0.350 - 4.500 uIU/mL    Comment: Performed by a 3rd Generation assay with a functional sensitivity of <=0.01 uIU/mL. Performed at [facility], [address] [place], [place] [address]   Comprehensive metabolic panel     Status: Abnormal   Collection Time: 11/20/19  5:10 PM  Result Value Ref Range   Sodium 142 135 - 145 mmol/L   Potassium 4.1 3.5 - 5.1 mmol/L   Chloride 102 98 - 111 mmol/L   CO2 29 22 - 32 mmol/L   Glucose, Bld 87 70 - 99 mg/dL    Comment: Glucose reference range applies only to samples  taken after fasting for at least 8 hours.   BUN 13 8 - 23 mg/dL   Creatinine, Ser 4.81 (H) 0.44 - 1.00 mg/dL   Calcium 9.6 8.9 - 85.6 mg/dL   Total Protein 7.5 6.5 - 8.1 g/dL   Albumin 4.4 3.5 - 5.0 g/dL   AST 20 15 - 41 U/L   ALT 17 0 - 44 U/L   Alkaline Phosphatase 78 38 - 126 U/L   Total Bilirubin 0.7 0.3 - 1.2 mg/dL   GFR calc non Af Amer 52 (L) >60 mL/min   GFR calc Af Amer >60 >60 mL/min   Anion gap 11 5 - 15    Comment: Performed at [facility], [address] [place], [place] [address]  CBC with Differential/Platelet     Status: Abnormal   Collection Time: 11/20/19  5:10 PM  Result Value Ref Range   WBC 11.2 (H) 4.0 - 10.5 K/uL   RBC 4.97 3.87 - 5.11 MIL/uL   Hemoglobin 15.3 (H) 12.0 - 15.0 g/dL   HCT 02.6 36 - 46 %   MCV 89.5 80.0 - 100.0 fL   MCH 30.8 26.0 - 34.0 pg   MCHC 34.4 30.0 - 36.0 g/dL   RDW 37.8 58.8 - 50.2 %   Platelets 246 150 - 400 K/uL   nRBC 0.0 0.0 - 0.2 %   Neutrophils Relative % 53 %   Neutro Abs 5.8 1.7 - 7.7 K/uL   Lymphocytes Relative 39 %   Lymphs Abs 4.3 (H) 0.7 - 4.0 K/uL   Monocytes Relative 6 %   Monocytes Absolute 0.7 0 - 1 K/uL   Eosinophils Relative 1 %   Eosinophils Absolute 0.1 0 - 0 K/uL   Basophils Relative 1 %   Basophils Absolute 0.1 0 - 0 K/uL   Immature Granulocytes 0 %   Abs Immature Granulocytes 0.02 0.00 - 0.07 K/uL    Comment: Performed at [facility], [address] [place], [place] [address]    Radiology: MM 3D SCREEN BREAST BILATERAL  Result Date: 12/11/2019 CLINICAL DATA:  Screening. EXAM: DIGITAL SCREENING BILATERAL MAMMOGRAM WITH TOMO AND CAD COMPARISON:  None. ACR Breast Density Category c: The breast tissue is heterogeneously dense, which may obscure small masses FINDINGS: There are no findings suspicious for malignancy. Images were processed with CAD. IMPRESSION: No mammographic evidence of malignancy. A result letter of this screening mammogram will be mailed directly to the patient.  RECOMMENDATION: Screening mammogram in one year. (Code:SM-B-01Y) BI-RADS  CATEGORY  1: Negative. Electronically Signed   By: Sherian Rein M.D.   On: 12/11/2019 12:58   Assessment and Plan: Patient Active Problem List   Diagnosis Date Noted  . Scoliosis 08/02/2015  . Chronic fatigue 02/06/2015  . CKD (chronic kidney disease) stage 3, GFR 30-59 ml/min 02/06/2015  . Nasal polyp, posterior 02/03/2015  . Muscle spasms of head and/or neck 11/11/2014  . Neck pain of over 3 months duration 10/21/2014  . Somatic dysfunction of head region 10/21/2014  . GERD (gastroesophageal reflux disease)   . Hypothyroidism   . Migraines   . Depression, major, recurrent, moderate (HCC) 07/20/2014    Class: Chronic  . OSA (obstructive sleep apnea) 02/15/2014  . Atrial fibrillation (HCC) 08/29/2012  . Dyspnea on exertion 08/29/2012  . DEGENERATIVE JOINT DISEASE 02/25/2008  . Hyperlipidemia 01/29/2007  . COPD (chronic obstructive pulmonary disease) (HCC) 01/29/2007  . Anxiety state 01/28/2007  . OVARIAN CYST, LEFT 05/14/2006     PLAN OSA:   Patient has a history of sleep apnea. It was initially treated surgically then eventually she went on CPAP. She did not have a machine for long and it has hence gone untreated. She reports,gasping, reflux, morning headaches and poor quality sleep. She is anxious to get restarted. She also has RLS which may improve when the apnea is treated    1. OSA - will need repeat PSG and then if needed a titration study. 2. Inadquate sleep hygiene.- this will be revisited once the patient is back on CPAP. 3. RLS- will revisit once the patient is reestablished on CPAP. 4. HTN-BP and HR well controlled toady, continue with current therapy and routine follow-up with PCP. 5. GERD- Well controlled at this time on protonix, knows what foods to avoid but has been eating cajun food lately that has exacerbated her GERD.  General Counseling: I have discussed the findings of the evaluation  and examination with Tarah.  I have also discussed any further diagnostic evaluation thatmay be needed or ordered today. Ashleyann verbalizes understanding of the findings of todays visit. We also reviewed her medications today and discussed drug interactions and side effects including but not limited excessive drowsiness and altered mental states. We also discussed that there is always a risk not just to her but also people around her. she has been encouraged to call the office with any questions or concerns that should arise related to todays visit.  This patient was seen by Leeanne Deed AGNP-C in Collaboration with Dr. Freda Munro as a part of collaborative care agreement.      I have personally obtained a history, evaluated the patient, evaluated pertinent data, formulated the assessment and plan and placed orders.   Valentino Hue Sol Blazing, PhD, FAASM  Diplomate, American Board of Sleep Medicine    Yevonne Pax, MD [facility] Diplomate ABMS Pulmonary and Critical Care Medicine Sleep medicine

## 2020-02-04 ENCOUNTER — Telehealth: Payer: Self-pay | Admitting: Family Medicine

## 2020-02-04 NOTE — Telephone Encounter (Signed)
Jessie, can you look into this please? 

## 2020-02-04 NOTE — Telephone Encounter (Signed)
Copied from CRM [id number]. Topic: Referral - Request for Referral >> Feb 04, 2020 12:43 PM Lyn Hollingshead D wrote: PT need another referral for a Sleep Study from someone who can take her insurance /  [facility] / please advise

## 2020-02-05 NOTE — Telephone Encounter (Signed)
Referral sent to [facility] because of insurance.

## 2020-02-05 NOTE — Telephone Encounter (Signed)
Patient notified

## 2020-02-11 ENCOUNTER — Ambulatory Visit (INDEPENDENT_AMBULATORY_CARE_PROVIDER_SITE_OTHER): Payer: 59 | Admitting: Internal Medicine

## 2020-02-11 ENCOUNTER — Encounter: Payer: Self-pay | Admitting: Internal Medicine

## 2020-02-11 ENCOUNTER — Other Ambulatory Visit
Admission: RE | Admit: 2020-02-11 | Discharge: 2020-02-11 | Disposition: A | Discharge status: Home / Self Care | Payer: 59 | Source: Ambulatory Visit | Attending: Internal Medicine | Admitting: Internal Medicine

## 2020-02-11 ENCOUNTER — Other Ambulatory Visit: Payer: Self-pay

## 2020-02-11 VITALS — BP 138/90 | HR 78 | Ht 63.0 in | Wt 216.0 lb

## 2020-02-11 DIAGNOSIS — I480 Paroxysmal atrial fibrillation: Secondary | ICD-10-CM | POA: Insufficient documentation

## 2020-02-11 LAB — CBC WITH DIFFERENTIAL/PLATELET
Abs Immature Granulocytes: 0.03 10*3/uL (ref 0.00–0.07)
Basophils Absolute: 0 10*3/uL (ref 0.0–0.1)
Basophils Relative: 0 %
Eosinophils Absolute: 0.1 10*3/uL (ref 0.0–0.5)
Eosinophils Relative: 1 %
HCT: 43.2 % (ref 36.0–46.0)
Hemoglobin: 14.6 g/dL (ref 12.0–15.0)
Immature Granulocytes: 0 %
Lymphocytes Relative: 21 %
Lymphs Abs: 2.1 10*3/uL (ref 0.7–4.0)
MCH: 30.8 pg (ref 26.0–34.0)
MCHC: 33.8 g/dL (ref 30.0–36.0)
MCV: 91.1 fL (ref 80.0–100.0)
Monocytes Absolute: 0.8 10*3/uL (ref 0.1–1.0)
Monocytes Relative: 7 %
Neutro Abs: 7.4 10*3/uL (ref 1.7–7.7)
Neutrophils Relative %: 71 %
Platelets: 238 10*3/uL (ref 150–400)
RBC: 4.74 MIL/uL (ref 3.87–5.11)
RDW: 12.5 % (ref 11.5–15.5)
WBC: 10.5 10*3/uL (ref 4.0–10.5)
nRBC: 0 % (ref 0.0–0.2)

## 2020-02-11 MED ORDER — PROPAFENONE HCL 225 MG PO TABS: 225.0000 mg | ORAL_TABLET | ORAL | 1 refills | Status: DC | PRN

## 2020-02-11 NOTE — Patient Instructions (Addendum)
Medication Instructions:  - Your physician recommends that you continue on your current medications as directed. Please refer to the Current Medication list given to you today.  *If you need a refill on your cardiac medications before your next appointment, please call your pharmacy*   Lab Work: - Your physician recommends that you have lab work today: CBC w/ diff  - [facility] Mall at [facility] - 1st desk on the right to check in - Lab hours: Monday- Friday (7:30 am- 5:30 pm)   If you have labs (blood work) drawn today and your tests are completely normal, you will receive your results only by: Marland Kitchen MyChart Message (if you have MyChart) OR . A paper copy in the mail If you have any lab test that is abnormal or we need to change your treatment, we will call you to review the results.   Testing/Procedures: - none ordered   Follow-Up: At [facility], you and your health needs are our priority.  As part of our continuing mission to provide you with exceptional heart care, we have created designated Provider Care Teams.  These Care Teams include your primary Cardiologist (physician) and Advanced Practice Providers (APPs -  Physician Assistants and Nurse Practitioners) who all work together to provide you with the care you need, when you need it.  We recommend signing up for the patient portal called "MyChart".  Sign up information is provided on this After Visit Summary.  MyChart is used to connect with patients for Virtual Visits (Telemedicine).  Patients are able to view lab/test results, encounter notes, upcoming appointments, etc.  Non-urgent messages can be sent to your provider as well.   To learn more about what you can do with MyChart, go to [email or web address].    Your next appointment:   1 year(s)  The format for your next appointment:   In Person  Provider:   Sherryl Manges, MD   Other Instructions  Kardia for monitoring of EKGs at home: You can look into the [facility]  device by [employer].This device is purchased by you and it connects to an application you download to your smart phone.  It can detect abnormal heart rhythms and alert you to contact your doctor for further evaluation. The device is approximately $90 and the phone application is free.  The web site is:  [email or web address]://www.alivecor.com

## 2020-02-11 NOTE — Progress Notes (Signed)
ELECTROPHYSIOLOGY OFFICE NOTE  [name], MRN: [id number], DOB/AGE: 12-22-1955 64 y.o. Admit date: (Not on file) Date of Consult: 02/11/2020  Primary Physician: Dorcas Carrow, DO Primary Cardiologist: sk        HPI  [name] is a 64 y.o. female seen to reestablish care having been seen previously for atrial fibrillation Most recently in 2017 and before that 2014.  Her atrial fibrillation history dates back to the 90s.  She has a strong family history of early onset atrial fibrillation.  We do not have documentation of A. Fib.  Interestingly, her episodes can be sometimes terminated in the first couple of minutes with Valsalva or cough.  If not then may seem to have a change in character as she developed tunnel vision and lightheadedness.  When initially seen, given the infrequency of events, we had elected to go from propafenone daily--propafenone as needed.  She is using 2-3 times in the last 4 years.  Mild exercise dyspnea.  Some exertional back discomfort that is not relieved by rest, attributed to scoliosis.  Some edema.  Elevated blood pressure which she is ascribed to increase social stress related to her daughters as well as increased sodium.  She is working on ameliorating both.  No nocturnal dyspnea or orthopnea.  Intercurrently diagnosed with significant hypercholesterolemia with an LDL of over 175, started on a statin DATE TEST EF   3/09 Echo   65 %   5/14 Myoview   75 % No ischemia        Date Cr K TSH LDL Hgb  7//21 1.12 4.1 2.55 179 15.3    Thromboembolic risk factors , HTN-1, Gender-1) for a CHADSVASc Score of 2   Past Medical History:  Diagnosis Date  . Anxiety state, unspecified   . Back pain   . Chest pain    a. 2008 Cath: reportedly nl;  b. 09/2012 Lexi MV: EF 75%, soft tissue attenuation->Low risk.  Marland Kitchen COPD (chronic obstructive pulmonary disease) (HCC)   . CTS (carpal tunnel syndrome)   . Depression   . GERD  (gastroesophageal reflux disease)   . Migraines   . Neck pain   . Nontoxic multinodular goiter   . Other and unspecified hyperlipidemia   . Other and unspecified ovarian cyst    left  . Other chest pain   . PAF (paroxysmal atrial fibrillation) (HCC)    a. 07/2007 Echo: EF 65%, mildly dil LA;  b. currently on propafenone;  c. CHA2DS2VASc = 1 (gender)-->No anticoagulation.  Marland Kitchen PTSD (post-traumatic stress disorder)   . Tobacco use   . Tobacco use disorder   . Unspecified hypothyroidism   . Unspecified sleep apnea    resolved s/p UPPP surgery  . Vitamin B12 deficiency       Surgical History:  Past Surgical History:  Procedure Laterality Date  . bladder tac    . CARDIAC CATHETERIZATION  08/12/06   neg. Dr. Welton Flakes, cardio  . CHOLECYSTECTOMY    . FOOT SURGERY     bone removal from her foot  . MRI lumber spine     03/12/06-abn  . MRI pelvis  05/02/06   abn-[facility], neurosurg  . NASAL POLYP SURGERY     nasal ?  . PARATHYROIDECTOMY    . stress cardiolite  10/06/96   nl, EF 62%  . stress myoview  10/25/03   Dr. Park Breed, nl  . THYROIDECTOMY  5/92   massive goiter  . TONSILLECTOMY    .  TUBAL LIGATION  1988  . UVULOPALATOPHARYNGOPLASTY     and tonsilectomy. [name] 10/04     Home Meds: Current Meds  Medication Sig  . atorvastatin (LIPITOR) 40 MG tablet Take 1 tablet (40 mg total) by mouth daily.  . citalopram (CELEXA) 40 MG tablet Take 1 tablet (40 mg total) by mouth daily.  Marland Kitchen. levothyroxine (SYNTHROID) 125 MCG tablet Take 1 tablet (125 mcg total) by mouth daily with breakfast.  . pantoprazole (PROTONIX) 20 MG tablet Take 1 tablet (20 mg total) by mouth daily.  . [DISCONTINUED] propafenone (RYTHMOL) 225 MG tablet Take 225 mg by mouth as needed. Afib  . propafenone (RYTHMOL) 225 MG tablet Take 1 tablet (225 mg total) by mouth as needed. Afib    Allergies:  Allergies  Allergen Reactions  . Bee Venom Anaphylaxis  . Penicillins Rash  . Sulfonamide Derivatives Rash  . Cardizem   [Diltiazem Hcl] Other (See Comments)    Other Reaction: severe hypotension w/ IV dose  . Sulfa Antibiotics Hives  . Tape Rash    Uncoded Allergy. Allergen: tape & bandaids    Social History   Socioeconomic History  . Marital status: Widowed    Spouse name: Not on file  . Number of children: Not on file  . Years of education: Not on file  . Highest education level: Not on file  Occupational History  . Not on file  Tobacco Use  . Smoking status: Former Smoker    Packs/day: 0.50    Years: 20.00    Pack years: 10.00    Types: Cigarettes    Quit date: 09/30/2017    Years since quitting: 2.3  . Smokeless tobacco: Never Used  Vaping Use  . Vaping Use: Some days  Substance and Sexual Activity  . Alcohol use: Yes    Alcohol/week: 0.0 standard drinks    Comment: Rare  . Drug use: No  . Sexual activity: Not Currently    Birth control/protection: Post-menopausal  Other Topics Concern  . Not on file  Social History Narrative   Widowed. 2 children, 1 granddaughter   Production designer, theatre/television/filmManager at [employer]	[employer] [place]    Social Determinants of Health   Financial Resource Strain:   . Difficulty of Paying Living Expenses: Not on file  Food Insecurity:   . Worried About Programme researcher, broadcasting/film/videounning Out of Food in the Last Year: Not on file  . Ran Out of Food in the Last Year: Not on file  Transportation Needs:   . Lack of Transportation (Medical): Not on file  . Lack of Transportation (Non-Medical): Not on file  Physical Activity:   . Days of Exercise per Week: Not on file  . Minutes of Exercise per Session: Not on file  Stress:   . Feeling of Stress : Not on file  Social Connections:   . Frequency of Communication with Friends and Family: Not on file  . Frequency of Social Gatherings with Friends and Family: Not on file  . Attends Religious Services: Not on file  . Active Member of Clubs or Organizations: Not on file  . Attends BankerClub or Organization Meetings: Not on file  . Marital Status: Not on file  Intimate Partner  Violence:   . Fear of Current or Ex-Partner: Not on file  . Emotionally Abused: Not on file  . Physically Abused: Not on file  . Sexually Abused: Not on file     Family History  Problem Relation Age of Onset  . Stroke Father   . Heart disease  Mother   . Atrial fibrillation Mother   . Congestive Heart Failure Mother   . Atrial fibrillation Sister        s/p ablation  . Breast cancer Sister 47  . Hyperlipidemia Daughter   . Heart disease Daughter        leaky valve  . Atrial fibrillation Other   . Heart attack Other        both sides  . Stroke Other        both sides  . Depression Other        both sides  . Prostate cancer Neg Hx   . Colon cancer Neg Hx      ROS:  Please see the history of present illness.     All other systems reviewed and negative.    Physical Exam: Blood pressure 138/90, pulse 78, height 5\' 3"  (1.6 m), weight 216 lb (98 kg). General: Well developed, well nourished female in no acute distress. Head: Normocephalic, atraumatic, sclera non-icteric, no xanthomas, nares are without discharge. EENT: normal  Lymph Nodes:  none Neck: Negative for carotid bruits. JVD 7-8 cm Back:without scoliosis kyphosis Lungs: Clear bilaterally to auscultation without wheezes, rales, or rhonchi. Breathing is unlabored. Heart: RRR with S1 S2. No  murmur . No rubs, or gallops appreciated. Abdomen: Soft, non-tender, non-distended with normoactive bowel sounds. No hepatomegaly. No rebound/guarding. No obvious abdominal masses. Msk:  Strength and tone appear normal for age. Extremities: No clubbing or cyanosis. No edema.  Distal pedal pulses are 2+ and equal bilaterally. Skin: Warm and Dry Neuro: Alert and oriented X 3. CN III-XII intact Grossly normal sensory and motor function . Psych:  Responds to questions appropriately with a normal affect.      Labs: Cardiac Enzymes No results for input(s): CKTOTAL, CKMB, TROPONINI in the last 72 hours. CBC Lab Results  Component  Value Date   WBC 11.2 (H) 11/20/2019   HGB 15.3 (H) 11/20/2019   HCT 44.5 11/20/2019   MCV 89.5 11/20/2019   PLT 246 11/20/2019   PROTIME: No results for input(s): LABPROT, INR in the last 72 hours. Chemistry No results for input(s): NA, K, CL, CO2, BUN, CREATININE, CALCIUM, PROT, BILITOT, ALKPHOS, ALT, AST, GLUCOSE in the last 168 hours.  Invalid input(s): LABALBU Lipids Lab Results  Component Value Date   CHOL 256 (H) 11/20/2019   HDL 41 11/20/2019   LDLCALC 179 (H) 11/20/2019   TRIG 182 (H) 11/20/2019   BNP No results found for: PROBNP Thyroid Function Tests: No results for input(s): TSH, T4TOTAL, T3FREE, THYROIDAB in the last 72 hours.  Invalid input(s): FREET3 Miscellaneous Lab Results  Component Value Date   DDIMER  07/30/2007    0.22        AT THE INHOUSE ESTABLISHED CUTOFF VALUE OF 0.48 ug/mL FEU, THIS ASSAY HAS BEEN DOCUMENTED IN THE LITERATURE TO HAVE    Radiology/Studies:  No results found.  EKG: Sinus at 78 Intervals 15/09/43 Axis left -35   Assessment and Plan:   Atrial fibrillation  Elevated blood pressure  Hypercholesterolemia  Polycythemia-borderline  Leukocytosis/` lymphocytosis    The patient has a history of atrial fibrillation which is preceded by a tachycardia which in her mind is terminating the ball by Valsalva and or coughing suggesting of AV nodal dependent mechanism.  With her long history of atrial fibrillation dating back to her 30s, raises the possibility that atrial fibrillation and her could be secondary and amenable to ablation of such a mechanism.  As she  encroaches upon the age of anticoagulation certainly the risks of a procedure are less than the risks of anticoagulation.  To this end we will use an AliveCor monitor to try to understand the mechanism of the onset of her palpitations i.e. the first 2 or 3 minutes.  Her indications for anticoagulation are borderline.  She has a borderline blood pressure which she thinks  is secondary to stress.  At 68 we will hold off.  I discussed that we would probably at 65 need to embark especially if we can document atrial fibrillation with her AliveCor monitor.  She will work on her blood pressure.  Weight loss etc. also decreasing sodium intake.  She has a polycythemia.  Hard to know the mechanism of this as she does not smoke a lot.  Obesity may be contributing.  She also has a leukocytosis/lymphocytosis.  This dates back a couple years.  We will repeated today, the concern is I have reviewed with her is beyond my ken but would include CLL.     Sherryl Manges

## 2020-02-12 ENCOUNTER — Telehealth: Payer: Self-pay

## 2020-02-12 NOTE — Telephone Encounter (Signed)
Sleep study appt. 02/29/2020 @8 :45AM at [facility]

## 2020-02-29 ENCOUNTER — Ambulatory Visit: Payer: 59 | Attending: Neurology

## 2020-02-29 DIAGNOSIS — G4733 Obstructive sleep apnea (adult) (pediatric): Secondary | ICD-10-CM | POA: Diagnosis present

## 2020-03-01 ENCOUNTER — Other Ambulatory Visit: Payer: Self-pay

## 2020-03-08 ENCOUNTER — Other Ambulatory Visit: Payer: Self-pay | Admitting: Internal Medicine

## 2020-03-09 ENCOUNTER — Ambulatory Visit: Payer: 59 | Admitting: Gastroenterology

## 2020-03-22 ENCOUNTER — Telehealth: Payer: Self-pay

## 2020-03-22 NOTE — Telephone Encounter (Signed)
Called and LVM asking for patient to please return my call. Please find out what questions the patient has when she calls back.

## 2020-03-22 NOTE — Telephone Encounter (Signed)
Patient notified of results. Titration form printed out and placed in providers folder for signature. Will fax back in to [facility] once signed.

## 2020-03-22 NOTE — Telephone Encounter (Signed)
Please let her know that her sleep study came back with moderate severe sleep apnea. They should be contacting her for a CPAP titration- if they haven't yet, please let me know so we can reach out to get that scheduled. Thanks!

## 2020-03-22 NOTE — Telephone Encounter (Signed)
Copied from CRM [id number]. Topic: General - Other >> Mar 22, 2020 10:11 AM Tamela Oddi wrote: Reason for CRM: Patient would like the nurse or doctor to call her regarding her sleep study results.  She has some questions.  CB# [phone]

## 2020-03-22 NOTE — Telephone Encounter (Signed)
Patient returned call. She states she has not heard about her sleep study results and what they say. Results are scanned into media.

## 2020-03-24 NOTE — Telephone Encounter (Signed)
Forms faxed back into [facility].

## 2020-04-12 ENCOUNTER — Ambulatory Visit: Payer: 59 | Attending: Neurology

## 2020-04-12 DIAGNOSIS — G4733 Obstructive sleep apnea (adult) (pediatric): Secondary | ICD-10-CM | POA: Insufficient documentation

## 2020-04-14 ENCOUNTER — Other Ambulatory Visit: Payer: Self-pay

## 2020-04-26 ENCOUNTER — Other Ambulatory Visit: Payer: Self-pay

## 2020-04-26 ENCOUNTER — Other Ambulatory Visit: Payer: Self-pay | Admitting: Internal Medicine

## 2020-04-26 ENCOUNTER — Ambulatory Visit (INDEPENDENT_AMBULATORY_CARE_PROVIDER_SITE_OTHER): Payer: 59 | Admitting: Gastroenterology

## 2020-04-26 VITALS — BP 137/88 | HR 93 | Temp 98.3°F | Ht 63.0 in | Wt 211.0 lb

## 2020-04-26 DIAGNOSIS — Z1211 Encounter for screening for malignant neoplasm of colon: Secondary | ICD-10-CM

## 2020-04-26 DIAGNOSIS — R140 Abdominal distension (gaseous): Secondary | ICD-10-CM | POA: Diagnosis not present

## 2020-04-26 MED ORDER — NA SULFATE-K SULFATE-MG SULF 17.5-3.13-1.6 GM/177ML PO SOLN
1.0000 | Freq: Once | ORAL | 0 refills | Status: AC
Start: 2020-04-26 — End: 2020-04-26

## 2020-04-26 NOTE — Progress Notes (Signed)
Wyline Mood MD, MRCP(U.K) [address]  [address]  [place], [place] [address]  Main: [phone]  Fax: [phone]   Gastroenterology Consultation  Referring Provider:     Dorcas Carrow, DO Primary Care Physician:  Dorcas Carrow, DO Primary Gastroenterologist:  Dr. Wyline Mood  Reason for Consultation:     Bloating        HPI:   [name] is a 64 y.o. y/o female referred for consultation & management  by Dr. Laural Benes, Megan P, DO.    02/11/2020: CBC: Hemoglobin 14.6 g.  11/20/2019: CMP normal except a creatinine of 1.12. She states that she has had issues with bloating for many years.  Her sister who is a retired Engineer, civil (consulting) was concerned and asked her to come and see [place].  She has regular bowel movements.  Daily.  Denies any constipation.  The bloating begins while after she has her meal.  Associated flatulence.  She feels like she looks pregnant at times.  Consumes 2 glasses of sweet tea which is commercially purchased.  2 sticks of chewing gum.  Uses coffee Whitener daily in her drinks.  Last colonoscopy was many years back and she is overdue.  Suffered from atrial fibrillation her entire life.  Past Medical History:  Diagnosis Date  . Anxiety state, unspecified   . Back pain   . Chest pain    a. 2008 Cath: reportedly nl;  b. 09/2012 Lexi MV: EF 75%, soft tissue attenuation->Low risk.  Marland Kitchen COPD (chronic obstructive pulmonary disease) (HCC)   . CTS (carpal tunnel syndrome)   . Depression   . GERD (gastroesophageal reflux disease)   . Migraines   . Neck pain   . Nontoxic multinodular goiter   . Other and unspecified hyperlipidemia   . Other and unspecified ovarian cyst    left  . Other chest pain   . PAF (paroxysmal atrial fibrillation) (HCC)    a. 07/2007 Echo: EF 65%, mildly dil LA;  b. currently on propafenone;  c. CHA2DS2VASc = 1 (gender)-->No anticoagulation.  Marland Kitchen PTSD (post-traumatic stress disorder)   . Tobacco use   . Tobacco use disorder   . Unspecified  hypothyroidism   . Unspecified sleep apnea    resolved s/p UPPP surgery  . Vitamin B12 deficiency     Past Surgical History:  Procedure Laterality Date  . bladder tac    . CARDIAC CATHETERIZATION  08/12/06   neg. Dr. Welton Flakes, cardio  . CHOLECYSTECTOMY    . FOOT SURGERY     bone removal from her foot  . MRI lumber spine     03/12/06-abn  . MRI pelvis  05/02/06   abn-[facility], neurosurg  . NASAL POLYP SURGERY     nasal ?  . PARATHYROIDECTOMY    . stress cardiolite  10/06/96   nl, EF 62%  . stress myoview  10/25/03   Dr. Park Breed, nl  . THYROIDECTOMY  5/92   massive goiter  . TONSILLECTOMY    . TUBAL LIGATION  1988  . UVULOPALATOPHARYNGOPLASTY     and tonsilectomy. Gertie Baron 10/04    Prior to Admission medications   Medication Sig Start Date End Date Taking? Authorizing Provider  atorvastatin (LIPITOR) 40 MG tablet Take 1 tablet (40 mg total) by mouth daily. 12/21/19   Johnson, Megan P, DO  citalopram (CELEXA) 40 MG tablet Take 1 tablet (40 mg total) by mouth daily. 11/20/19   Olevia Perches P, DO  levothyroxine (SYNTHROID) 125 MCG tablet Take  1 tablet (125 mcg total) by mouth daily with breakfast. 11/23/19   Olevia Perches P, DO  pantoprazole (PROTONIX) 20 MG tablet Take 1 tablet (20 mg total) by mouth daily. 11/20/19   Johnson, Megan P, DO  propafenone (RYTHMOL) 225 MG tablet TAKE ONE TABLET BY MOUTH AS NEEDED FOR AFIB 03/08/20   Duke Salvia, MD    Family History  Problem Relation Age of Onset  . Stroke Father   . Heart disease Mother   . Atrial fibrillation Mother   . Congestive Heart Failure Mother   . Atrial fibrillation Sister        s/p ablation  . Breast cancer Sister 38  . Hyperlipidemia Daughter   . Heart disease Daughter        leaky valve  . Atrial fibrillation Other   . Heart attack Other        both sides  . Stroke Other        both sides  . Depression Other        both sides  . Prostate cancer Neg Hx   . Colon cancer Neg Hx      Social History    Tobacco Use  . Smoking status: Former Smoker    Packs/day: 0.50    Years: 20.00    Pack years: 10.00    Types: Cigarettes    Quit date: 09/30/2017    Years since quitting: 2.5  . Smokeless tobacco: Never Used  Vaping Use  . Vaping Use: Some days  Substance Use Topics  . Alcohol use: Yes    Alcohol/week: 0.0 standard drinks    Comment: Rare  . Drug use: No    Allergies as of 04/26/2020 - Review Complete 04/26/2020  Allergen Reaction Noted  . Bee venom Anaphylaxis 02/01/2015  . Penicillins Rash   . Sulfonamide derivatives Rash   . Cardizem  [diltiazem hcl] Other (See Comments) 02/03/2015  . Sulfa antibiotics Hives 10/09/2017  . Tape Rash     Review of Systems:    All systems reviewed and negative except where noted in HPI.   Physical Exam:  BP 137/88   Pulse 93   Temp 98.3 F (36.8 C)   Ht 5\' 3"  (1.6 m)   Wt 211 lb (95.7 kg)   BMI 37.38 kg/m  No LMP recorded. Patient is postmenopausal. Psych:  Alert and cooperative. Normal mood and affect. General:   Alert,  Well-developed, well-nourished, pleasant and cooperative in NAD Head:  Normocephalic and atraumatic. Eyes:  Sclera clear, no icterus.   Conjunctiva pink. Lungs:  Respirations even and unlabored.  Clear throughout to auscultation.   No wheezes, crackles, or rhonchi. No acute distress. Heart:  Regular rate and rhythm; no murmurs, clicks, rubs, or gallops. Abdomen:  Normal bowel sounds.  No bruits.  Soft, non-tender and non-distended without masses, hepatosplenomegaly or hernias noted.  No guarding or rebound tenderness.    Neurologic:  Alert and oriented x3;  grossly normal neurologically. Psych:  Alert and cooperative. Normal mood and affect.  Imaging Studies: SLEEP STUDY DOCUMENTS  Result Date: 04/19/2020 Ordered by an unspecified provider.  SLEEP STUDY DOCUMENTS  Result Date: 04/14/2020 Ordered by an unspecified provider.   Assessment and Plan:   [name] is a 64 y.o. y/o female has been  referred for bloating.  Likely secondary to use of large quantity of artificial sugars in the diet.  This can cause bloating due to bacterial fermentation of these sugars in the small bowel.  Plan 1.  Colonoscopy for colon cancer screening 2.  Low FODMAP diet.  Trial of activated charcoal.  Avoid all artificial sugars and sweeteners. 3.  Celiac serology I have discussed alternative options, risks & benefits,  which include, but are not limited to, bleeding, infection, perforation,respiratory complication & drug reaction.  The patient agrees with this plan & written consent will be obtained.    Follow up in 3 months office visit  Dr Wyline Mood MD,MRCP(U.K)

## 2020-04-26 NOTE — Telephone Encounter (Signed)
Called patient to verify if she really needed a refill on this medication.  Patient stated that she likes to have extra of this for any "just incases" but she did not need it refilled at this time.  I made her aware that Herbert Seta, RN stated that if she is having to take it more often than not then she would need to give [place] a call.  She stated she has only had to take it twice since she last seen Dr. Graciela Husbands.   Made patient aware that I will refuse this refill and for her to call us when she was ready for a refill as this is an as needed medication.

## 2020-04-27 LAB — CELIAC DISEASE AB SCREEN W/RFX
Antigliadin Abs, IgA: 4 units (ref 0–19)
IgA/Immunoglobulin A, Serum: 380 mg/dL — ABNORMAL HIGH (ref 87–352)
Transglutaminase IgA: <2 U/mL (ref 0–3)

## 2020-04-28 ENCOUNTER — Encounter: Payer: Self-pay | Admitting: Gastroenterology

## 2020-05-04 ENCOUNTER — Telehealth: Payer: Self-pay | Admitting: Family Medicine

## 2020-05-04 NOTE — Telephone Encounter (Signed)
Please advise 

## 2020-05-04 NOTE — Telephone Encounter (Signed)
Please write up Rx for CPAP pressure setting 9cm H20 with heated humidity and resMed F20 for her full face mask size med and I'll sign and we can fax

## 2020-05-04 NOTE — Telephone Encounter (Signed)
Pt called and stated her results came in from her sleep study and they advised her to wait for her PCP to contact her about any medications or CPAP and if ordered can the cleaning kit be included  / Pt wanted to check on the status of this / Pt also mention to tell Dr. Wynetta Emery that she saw Dr. Vicente Males and had a consultation with GI and she has a colonoscopy scheduled for 2.17.22

## 2020-05-04 NOTE — Telephone Encounter (Signed)
Please see study in chart, they are no longer ordering the c-paps we have to order them

## 2020-05-05 NOTE — Telephone Encounter (Signed)
In your folder for signature 

## 2020-06-07 ENCOUNTER — Telehealth: Payer: Self-pay

## 2020-06-07 DIAGNOSIS — Z1211 Encounter for screening for malignant neoplasm of colon: Secondary | ICD-10-CM

## 2020-06-07 NOTE — Telephone Encounter (Signed)
Patients call has been returned.  Her colonoscopy has been rescheduled from 06/23/20 to 08/17/20 due to not having anyone to accompany her to her procedure.  Vikki in Endo has been asked to reschedule procedure to 08/17/20.    New instructions will be sent via mychart and mailed. Referral updated.  Thanks,  [place], [place]

## 2020-06-08 ENCOUNTER — Telehealth: Payer: Self-pay

## 2020-06-08 NOTE — Telephone Encounter (Signed)
Copied from CRM [id number]. Topic: General - Other >> Jun 08, 2020  2:40 PM [name] A wrote: Reason for CRM: Patient calling in for assistance with sleep study paperwork [facility]  Patient is in the process of trying to secure a CPAP machine

## 2020-06-09 NOTE — Telephone Encounter (Signed)
Called and LVM with Ladona Ridgel at [facility] asking for her to please return my call to discuss this.

## 2020-06-13 NOTE — Telephone Encounter (Signed)
Form received from [facility] requesting patient's sleep study and OV note. Printed information and faxed back to [facility].  Called and LVM for patient letting her know that this was done.

## 2020-07-14 ENCOUNTER — Other Ambulatory Visit: Payer: Self-pay

## 2020-07-14 ENCOUNTER — Telehealth: Payer: Self-pay | Admitting: Gastroenterology

## 2020-07-14 MED ORDER — PEG 3350-KCL-NABCB-NACL-NASULF 236 G PO SOLR: ORAL | 0 refills | Status: DC

## 2020-07-14 NOTE — Telephone Encounter (Signed)
Patient needs another bowel prep called in, the first one was too expensive.   [facility]

## 2020-07-15 ENCOUNTER — Other Ambulatory Visit: Payer: Self-pay | Admitting: Family Medicine

## 2020-07-25 ENCOUNTER — Ambulatory Visit: Payer: 59 | Admitting: Gastroenterology

## 2020-07-29 ENCOUNTER — Other Ambulatory Visit: Payer: Self-pay | Admitting: Internal Medicine

## 2020-07-29 NOTE — Telephone Encounter (Signed)
Rx request sent to pharmacy.  

## 2020-07-29 NOTE — Telephone Encounter (Signed)
This is a [place] pt 

## 2020-08-12 ENCOUNTER — Telehealth: Payer: Self-pay

## 2020-08-12 NOTE — Telephone Encounter (Signed)
Pt has requested to cancel her colonoscopy scheduled with Dr. Tobi Bastos 08/17/20 due to starting new job next week.  She states that she will call back in June/July to reschedule.  Thanks,  [place], [place]

## 2020-08-15 ENCOUNTER — Other Ambulatory Visit: Payer: 59 | Attending: Gastroenterology

## 2020-08-17 ENCOUNTER — Encounter: Admission: RE | Payer: Self-pay | Source: Home / Self Care

## 2020-08-17 ENCOUNTER — Ambulatory Visit: Admission: RE | Admit: 2020-08-17 | Payer: 59 | Source: Home / Self Care | Admitting: Gastroenterology

## 2020-08-17 SURGERY — COLONOSCOPY WITH PROPOFOL
Anesthesia: General

## 2020-08-23 IMAGING — MG DIGITAL SCREENING BILAT W/ TOMO W/ CAD
6 of 10 series · 6 of 30 positions shown · non-contrast
Comparison: None.

CLINICAL DATA: Screening.

EXAM:
DIGITAL SCREENING BILATERAL MAMMOGRAM WITH TOMO AND CAD

[L MLO synth-2D]
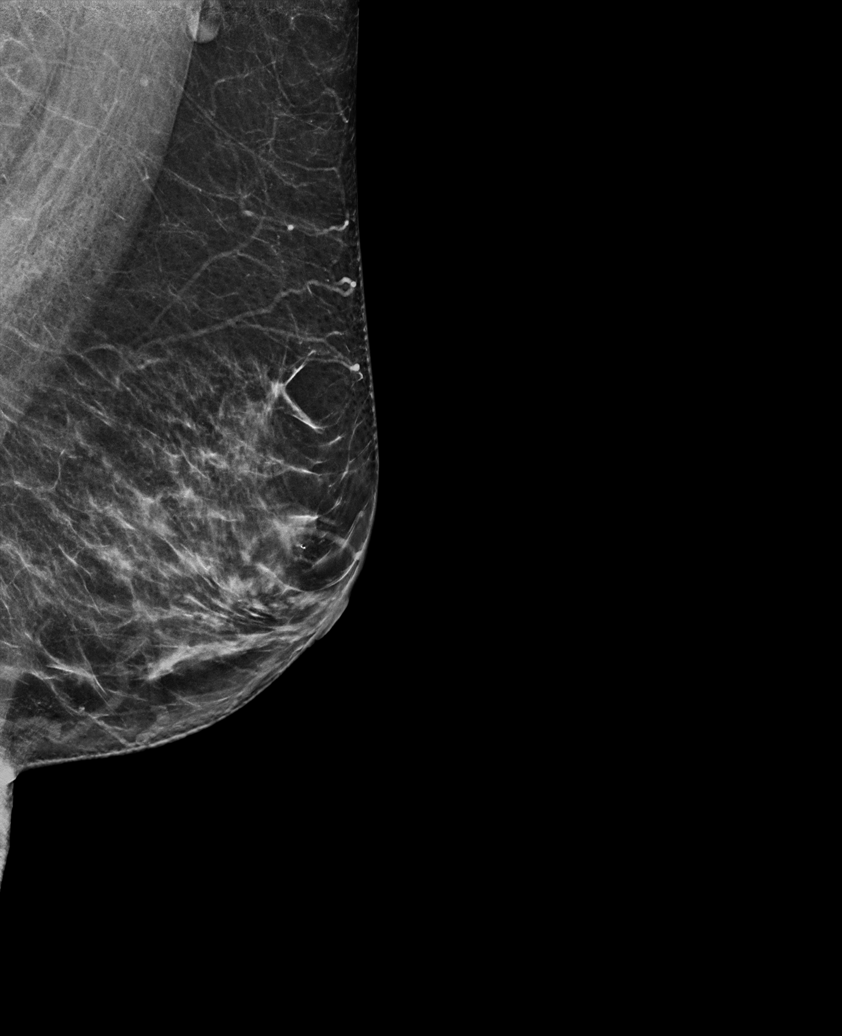

[R CC synth-2D]
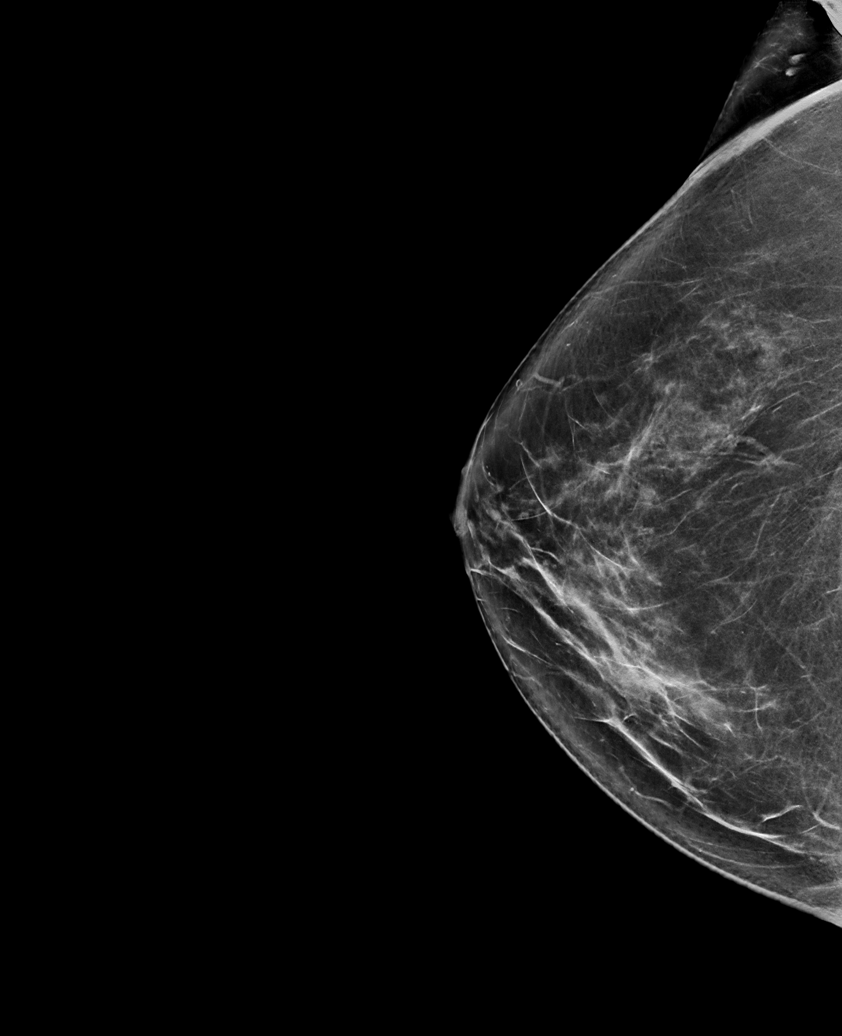

[R MLO synth-2D (1 of 2)]
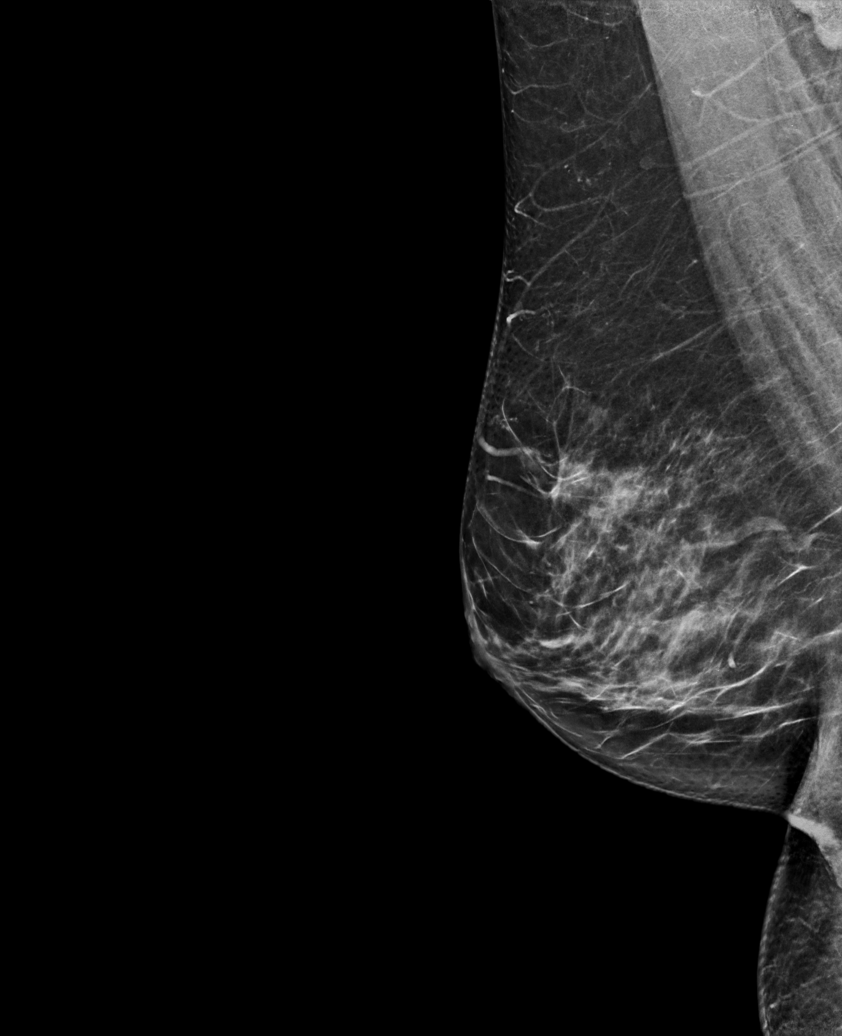

[L CC synth-2D]
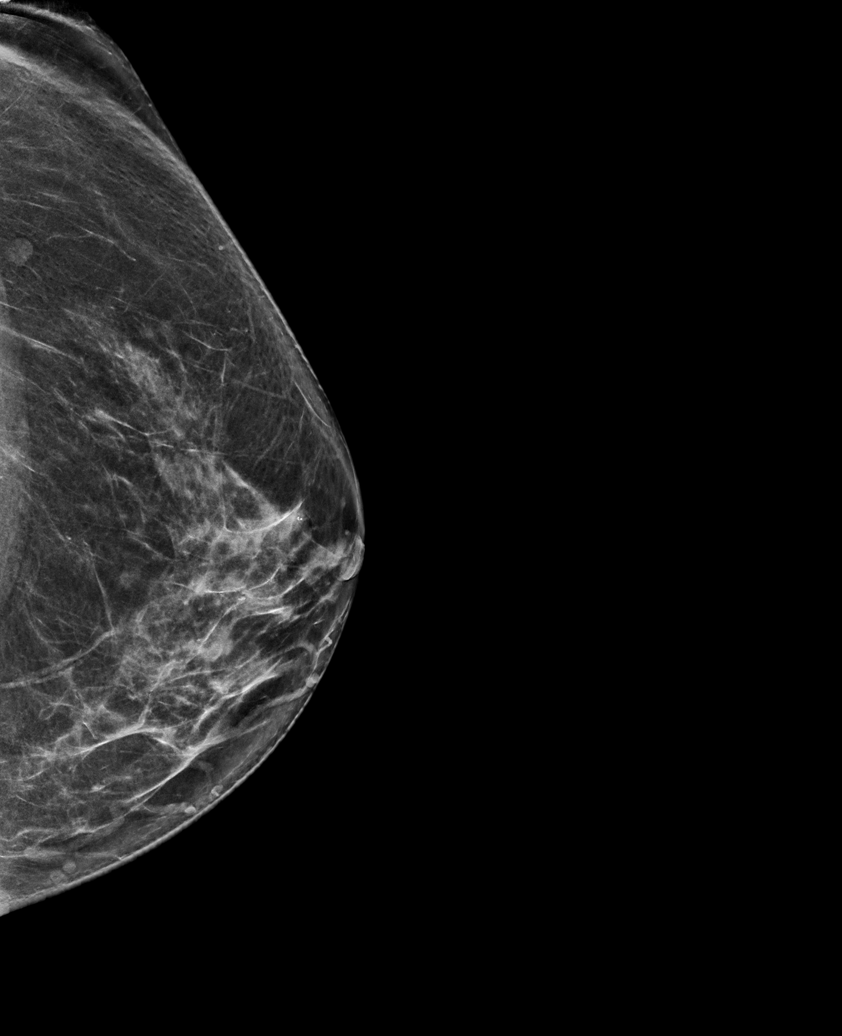

[R MLO synth-2D (2 of 2)]
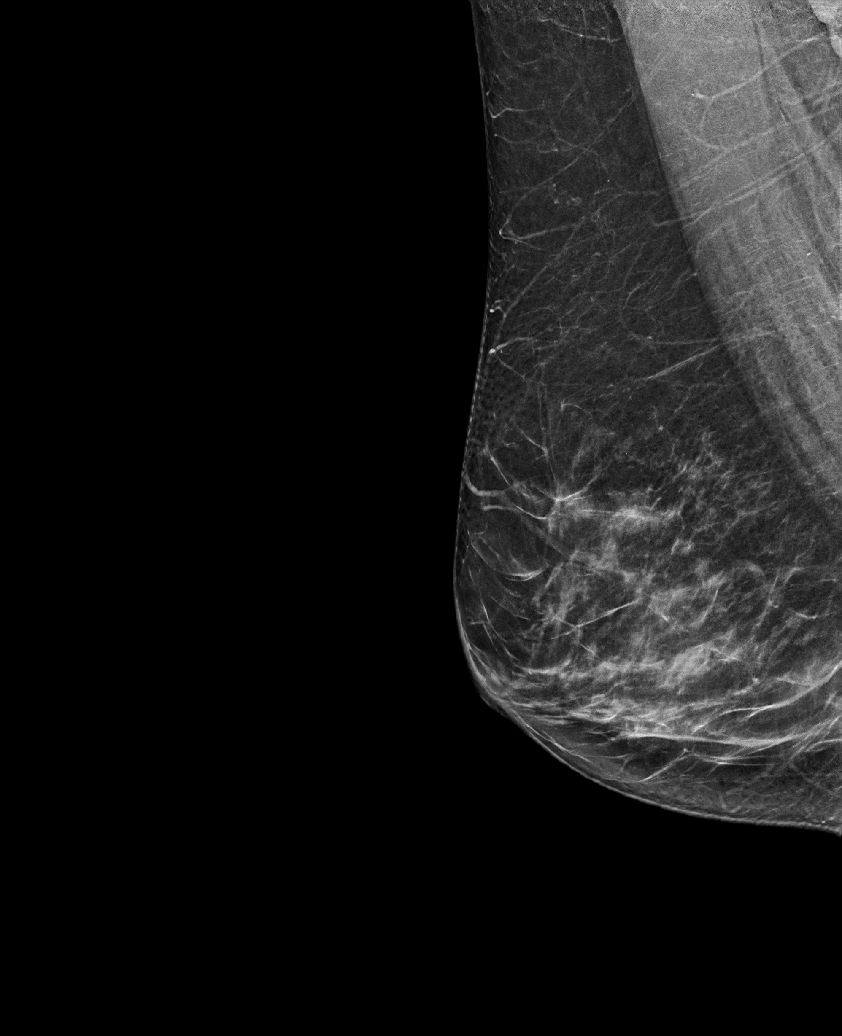

[L MLO tomo · tomo slice 38/75.0]
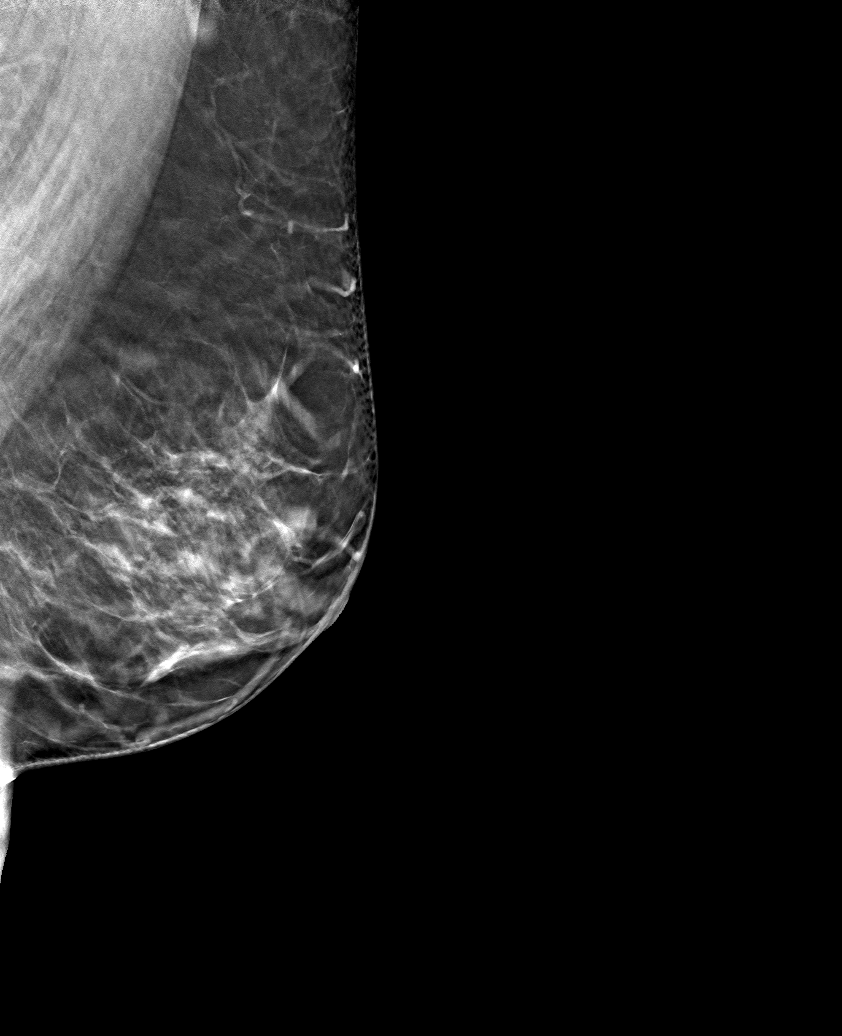

[6 of 30 positions shown; findings below may reference images not displayed]

ACR Breast Density Category c: The breast tissue is heterogeneously
dense, which may obscure small masses
FINDINGS: There are no findings suspicious for malignancy. Images were
processed with CAD.
IMPRESSION: No mammographic evidence of malignancy. A result letter of this
screening mammogram will be mailed directly to the patient.

RECOMMENDATION:
Screening mammogram in one year. (Code:EM-2-IHY)

BI-RADS CATEGORY  1: Negative.

## 2020-09-02 ENCOUNTER — Other Ambulatory Visit: Payer: Self-pay | Admitting: Family Medicine

## 2020-09-02 NOTE — Telephone Encounter (Signed)
   Notes to clinic: courtesy refill already given and appt has not been scheduled    Requested Prescriptions  Pending Prescriptions Disp Refills   citalopram (CELEXA) 40 MG tablet [Pharmacy Med Name: CITALOPRAM HBR 40 MG TABLET] 30 tablet 0    Sig: TAKE ONE TABLET BY MOUTH DAILY      Psychiatry:  Antidepressants - SSRI Failed - 09/02/2020  6:20 AM      Failed - Valid encounter within last 6 months    Recent Outpatient Visits           8 months ago Routine general medical examination at a health care facility   [facility], Megan P, DO   9 months ago Atrial fibrillation, unspecified type [facility])   [facility], Megan P, DO   1 year ago Hypothyroidism, unspecified type   [facility], Redge Gainer, MD   2 years ago Atrial fibrillation, unspecified type [facility])   [facility], Redge Gainer, MD   2 years ago Depression, major, recurrent, moderate (HCC)   [facility], Redge Gainer, MD       Future Appointments             In 1 week Wyline Mood, MD [facility]             Passed - Completed PHQ-2 or PHQ-9 in the last 360 days

## 2020-09-05 ENCOUNTER — Telehealth: Payer: Self-pay

## 2020-09-05 NOTE — Telephone Encounter (Signed)
Patient was seeing Dr. Freda Munro for this, last saw September 2021.  Recommend she reach out to them about her CPAP needs.

## 2020-09-05 NOTE — Telephone Encounter (Signed)
Copied from CRM [id number]. Topic: General - Other >> Sep 05, 2020  9:48 AM Gwenlyn Fudge wrote: Reason for CRM: Pt calling in regarding her  CPAP machine. She states that she has been looking for 8 months and cannot find out who it was ordered through. Pt states that her insurance runs out soon. Please advise.

## 2020-09-05 NOTE — Telephone Encounter (Signed)
Lvm to make this apt. 

## 2020-09-06 NOTE — Telephone Encounter (Signed)
Called patient left VM regarding Jolene's message.

## 2020-09-07 NOTE — Telephone Encounter (Signed)
Pt has apt on 09/14/2020

## 2020-09-07 NOTE — Telephone Encounter (Signed)
Routing to provider  

## 2020-09-12 ENCOUNTER — Ambulatory Visit: Payer: 59 | Admitting: Gastroenterology

## 2020-09-14 ENCOUNTER — Other Ambulatory Visit: Payer: Self-pay

## 2020-09-14 ENCOUNTER — Ambulatory Visit: Payer: 59 | Admitting: Nurse Practitioner

## 2020-09-14 ENCOUNTER — Encounter: Payer: Self-pay | Admitting: Nurse Practitioner

## 2020-09-14 VITALS — BP 138/86 | HR 90 | Temp 99.1°F | Wt 207.4 lb

## 2020-09-14 DIAGNOSIS — G4733 Obstructive sleep apnea (adult) (pediatric): Secondary | ICD-10-CM

## 2020-09-14 DIAGNOSIS — I480 Paroxysmal atrial fibrillation: Secondary | ICD-10-CM

## 2020-09-14 DIAGNOSIS — E039 Hypothyroidism, unspecified: Secondary | ICD-10-CM

## 2020-09-14 DIAGNOSIS — J449 Chronic obstructive pulmonary disease, unspecified: Secondary | ICD-10-CM

## 2020-09-14 DIAGNOSIS — E782 Mixed hyperlipidemia: Secondary | ICD-10-CM

## 2020-09-14 DIAGNOSIS — F331 Major depressive disorder, recurrent, moderate: Secondary | ICD-10-CM

## 2020-09-14 DIAGNOSIS — N1831 Chronic kidney disease, stage 3a: Secondary | ICD-10-CM

## 2020-09-14 MED ORDER — CITALOPRAM HYDROBROMIDE 40 MG PO TABS: 40.0000 mg | ORAL_TABLET | Freq: Every day | ORAL | 3 refills | Status: DC

## 2020-09-14 MED ORDER — ATORVASTATIN CALCIUM 40 MG PO TABS: 1.0000 | ORAL_TABLET | Freq: Every day | ORAL | 3 refills | Status: DC

## 2020-09-14 MED ORDER — PANTOPRAZOLE SODIUM 20 MG PO TBEC: 20.0000 mg | DELAYED_RELEASE_TABLET | Freq: Every day | ORAL | 3 refills | Status: DC

## 2020-09-14 MED ORDER — LEVOTHYROXINE SODIUM 125 MCG PO TABS: 125.0000 ug | ORAL_TABLET | Freq: Every day | ORAL | 3 refills | Status: DC

## 2020-09-14 NOTE — Progress Notes (Signed)
BP 138/86   Pulse 90   Temp 99.1 F (37.3 C)   Wt 207 lb 6 oz (94.1 kg)   SpO2 97%   BMI 36.73 kg/m    Subjective:    [name], female    DOB: 04/09/1956, 65 y.o.   MRN: [id number]  HPI: [name] is a 65 y.o. female  Chief Complaint  Patient presents with  . Hyperlipidemia   SLEEP APNEA Sleep apnea status: uncontrolled Duration: months Satisfied with current treatment?:  no CPAP use:  hasn't been able to get it due to shortage. Sleep quality with CPAP use: poor Treament compliance:hasn't received machine Last sleep study:  Treatments attempted:  Wakes feeling refreshed:  no Daytime hypersomnolence:  no Fatigue:  yes Insomnia:  no Good sleep hygiene:  yes Difficulty falling asleep:  no Difficulty staying asleep:  yes Snoring bothers bed partner:  NA Observed apnea by bed partner: NA Obesity:  yes Hypertension: no  Pulmonary hypertension:  no Coronary artery disease:  no  HYPERLIPIDEMIA Hyperlipidemia status: excellent compliance Satisfied with current treatment?  NA Side effects:  no Medication compliance: excellent compliance Past cholesterol meds: none Supplements: none Aspirin:  no The 10-year ASCVD risk score Denman George DC Jr., et al., 2013) is: 7.6%   Values used to calculate the score:     Age: 82 years     Sex: Female     Is Non-Hispanic African American: No     Diabetic: No     Tobacco smoker: No     Systolic Blood Pressure: 138 mmHg     Is BP treated: No     HDL Cholesterol: 41 mg/dL     Total Cholesterol: 256 mg/dL Chest pain:  no Coronary artery disease:  no Family history CAD:  no Family history early CAD:  no  AFIB Well controlled. Sees cardiology annually.   Denies HA, CP, SOB, dizziness, palpitations, visual changes, and lower extremity swelling.  DEPRESSION/ANXIETY Patient feels like symptoms are well controlled.  Denies SI.  Flowsheet Row Office Visit from 09/14/2020 in [facility]  PHQ-9  Total Score 3     GAD 7 : Generalized Anxiety Score 09/14/2020 11/22/2019 09/06/2016  Nervous, Anxious, on Edge 0 1 0  Control/stop worrying 0 1 0  Worry too much - different things 0 1 1  Trouble relaxing 0 1 1  Restless 1 0 0  Easily annoyed or irritable 1 1 0  Afraid - awful might happen 0 0 0  Total GAD 7 Score 2 5 2   Anxiety Difficulty Not difficult at all Somewhat difficult -     Relevant past medical, surgical, family and social history reviewed and updated as indicated. Interim medical history since our last visit reviewed. Allergies and medications reviewed and updated.  Review of Systems  Constitutional: Positive for fatigue.  Eyes: Negative for visual disturbance.  Respiratory: Negative for cough, chest tightness and shortness of breath.   Cardiovascular: Negative for chest pain, palpitations and leg swelling.  Neurological: Negative for dizziness and headaches.  Psychiatric/Behavioral: Positive for sleep disturbance. Negative for dysphoric mood and suicidal ideas. The patient is not nervous/anxious.     Per HPI unless specifically indicated above     Objective:    BP 138/86   Pulse 90   Temp 99.1 F (37.3 C)   Wt 207 lb 6 oz (94.1 kg)   SpO2 97%   BMI 36.73 kg/m   Wt Readings from Last 3 Encounters:  09/14/20 207 lb 6 oz (94.1 kg)  04/26/20 211 lb (95.7 kg)  02/11/20 216 lb (98 kg)    Physical Exam Vitals and nursing note reviewed.  Constitutional:      General: She is not in acute distress.    Appearance: Normal appearance. She is normal weight. She is not ill-appearing, toxic-appearing or diaphoretic.  HENT:     Head: Normocephalic.     Right Ear: External ear normal.     Left Ear: External ear normal.     Nose: Nose normal.     Mouth/Throat:     Mouth: Mucous membranes are moist.     Pharynx: Oropharynx is clear.  Eyes:     General:        Right eye: No discharge.        Left eye: No discharge.     Extraocular Movements: Extraocular movements  intact.     Conjunctiva/sclera: Conjunctivae normal.     Pupils: Pupils are equal, round, and reactive to light.  Cardiovascular:     Rate and Rhythm: Normal rate and regular rhythm.     Heart sounds: No murmur heard.   Pulmonary:     Effort: Pulmonary effort is normal. No respiratory distress.     Breath sounds: Normal breath sounds. No wheezing or rales.  Musculoskeletal:     Cervical back: Normal range of motion and neck supple.  Skin:    General: Skin is warm and dry.     Capillary Refill: Capillary refill takes less than 2 seconds.  Neurological:     General: No focal deficit present.     Mental Status: She is alert and oriented to person, place, and time. Mental status is at baseline.  Psychiatric:        Mood and Affect: Mood normal.        Behavior: Behavior normal.        Thought Content: Thought content normal.        Judgment: Judgment normal.     Results for orders placed or performed in visit on 04/26/20  Celiac Disease Ab Screen w/Rfx  Result Value Ref Range   Antigliadin Abs, IgA 4 0 - 19 units   Transglutaminase IgA <2 0 - 3 U/mL   IgA/Immunoglobulin A, Serum 380 (H) 87 - 352 mg/dL      Assessment & Plan:   Problem List Items Addressed This Visit      Cardiovascular and Mediastinum   Atrial fibrillation (HCC)    Stable. Followed by Cardiology. Continue per their recommendations.       Relevant Medications   atorvastatin (LIPITOR) 40 MG tablet     Respiratory   COPD (chronic obstructive pulmonary disease) (HCC)    Stable off medicine. Continue to monitor. Call with any concerns.       OSA (obstructive sleep apnea) - Primary    Uncontrolled.  Has not received CPAP yet.  Will follow up once she has been using CPAP for a couple of months.        Endocrine   Hypothyroidism    Rechecking labs today. Await results. Treat as needed.       Relevant Medications   levothyroxine (SYNTHROID) 125 MCG tablet     Genitourinary   CKD (chronic kidney  disease) stage 3, GFR 30-59 ml/min (HCC)    Stable.  Will check lab work today.  Will make recommendations based on lab results.         Other   Hyperlipidemia  Will start atorvastatin. Call with any concerns. Labs ordered today.       Relevant Medications   atorvastatin (LIPITOR) 40 MG tablet   Depression, major, recurrent, moderate (HCC)    Doing much better on the higher dose of celexa. Call with any concerns. Continue to monitor.       Relevant Medications   citalopram (CELEXA) 40 MG tablet       Follow up plan: Return in about 6 months (around 03/17/2021) for Physical and Fasting labs.

## 2020-09-15 NOTE — Assessment & Plan Note (Signed)
Uncontrolled.  Has not received CPAP yet.  Will follow up once she has been using CPAP for a couple of months.

## 2020-09-15 NOTE — Assessment & Plan Note (Signed)
Stable. Followed by Cardiology. Continue per their recommendations.

## 2020-09-15 NOTE — Assessment & Plan Note (Signed)
Stable.  Will check lab work today.  Will make recommendations based on lab results.

## 2020-09-15 NOTE — Assessment & Plan Note (Signed)
Stable off medicine. Continue to monitor. Call with any concerns.  

## 2020-09-15 NOTE — Assessment & Plan Note (Signed)
Doing much better on the higher dose of celexa. Call with any concerns. Continue to monitor.

## 2020-09-15 NOTE — Assessment & Plan Note (Signed)
Will start atorvastatin. Call with any concerns. Labs ordered today.

## 2020-09-15 NOTE — Assessment & Plan Note (Signed)
Rechecking labs today. Await results. Treat as needed.  °

## 2020-10-28 ENCOUNTER — Other Ambulatory Visit: Payer: Self-pay | Admitting: Internal Medicine

## 2020-10-28 NOTE — Telephone Encounter (Signed)
Copied from CRM [id number]. Topic: Quick Communication - Rx Refill/Question >> Oct 28, 2020  4:50 PM Izora Ribas, Everette A wrote: Medication: atorvastatin (LIPITOR) 40 MG tablet   citalopram (CELEXA) 40 MG tablet   levothyroxine (SYNTHROID) 125 MCG tablet   pantoprazole (PROTONIX) 20 MG tablet   Has the patient contacted their pharmacy? No. (Agent: If no, request that the patient contact the pharmacy for the refill.) (Agent: If yes, when and what did the pharmacy advise?)  Preferred Pharmacy (with phone number or street name): [id number] [place], [place] - [address]  Phone:  [phone] Fax:  [phone]  Agent: Please be advised that RX refills may take up to 3 business days. We ask that you follow-up with your pharmacy.

## 2020-10-29 MED ORDER — PANTOPRAZOLE SODIUM 20 MG PO TBEC: 20.0000 mg | DELAYED_RELEASE_TABLET | Freq: Every day | ORAL | 3 refills | Status: DC

## 2020-10-29 MED ORDER — LEVOTHYROXINE SODIUM 125 MCG PO TABS: 125.0000 ug | ORAL_TABLET | Freq: Every day | ORAL | 0 refills | Status: DC

## 2020-10-29 MED ORDER — ATORVASTATIN CALCIUM 40 MG PO TABS: 1.0000 | ORAL_TABLET | Freq: Every day | ORAL | 0 refills | Status: DC

## 2020-10-29 MED ORDER — CITALOPRAM HYDROBROMIDE 40 MG PO TABS: 40.0000 mg | ORAL_TABLET | Freq: Every day | ORAL | 1 refills | Status: DC

## 2020-11-02 ENCOUNTER — Ambulatory Visit: Payer: 59 | Admitting: Family Medicine

## 2020-11-02 ENCOUNTER — Other Ambulatory Visit: Payer: Self-pay

## 2020-11-02 ENCOUNTER — Encounter: Payer: Self-pay | Admitting: Family Medicine

## 2020-11-02 VITALS — BP 128/77 | HR 64 | Temp 97.9°F | Wt 214.2 lb

## 2020-11-02 DIAGNOSIS — M545 Low back pain, unspecified: Secondary | ICD-10-CM | POA: Diagnosis not present

## 2020-11-02 DIAGNOSIS — Z5989 Other problems related to housing and economic circumstances: Secondary | ICD-10-CM

## 2020-11-02 MED ORDER — KETOROLAC TROMETHAMINE 60 MG/2ML IM SOLN
60.0000 mg | Freq: Once | INTRAMUSCULAR | Status: AC
  Administered 2020-11-02: 60 mg via INTRAMUSCULAR

## 2020-11-02 MED ORDER — PREDNISONE 10 MG PO TABS: ORAL_TABLET | ORAL | 0 refills | Status: DC

## 2020-11-02 MED ORDER — BACLOFEN 10 MG PO TABS: 10.0000 mg | ORAL_TABLET | Freq: Three times a day (TID) | ORAL | 0 refills | Status: DC

## 2020-11-02 NOTE — Progress Notes (Signed)
BP 128/77   Pulse 64   Temp 97.9 F (36.6 C)   Wt 214 lb 3.2 oz (97.2 kg)   SpO2 95%   BMI 37.94 kg/m    Subjective:    [name], female    DOB: 09-13-1955, 65 y.o.   MRN: [id number]  HPI: [name] is a 65 y.o. female  Chief Complaint  Patient presents with   Back Pain    Patient states she has had back pain for years but the past two weeks have been worse    BACK PAIN Duration: chronic- worse in the past 2 weeks Mechanism of injury: sneezed and her back went out Location: R low back Onset: sudden Severity: severe Quality: sharp and throbbing and burning Frequency: constant Radiation: into the L side Aggravating factors: lifting, movement, walking, laying, bending, and prolonged sitting Alleviating factors: rest and ice Status: fluctuating Treatments attempted:  lidocaine, rest, ice, heat, APAP, ibuprofen, and aleve  Relief with NSAIDs?: no Nighttime pain:  yes Paresthesias / decreased sensation:  no Bowel / bladder incontinence:  no Fevers:  no Dysuria / urinary frequency:  no   Relevant past medical, surgical, family and social history reviewed and updated as indicated. Interim medical history since our last visit reviewed. Allergies and medications reviewed and updated.  Review of Systems  Per HPI unless specifically indicated above     Objective:    BP 128/77   Pulse 64   Temp 97.9 F (36.6 C)   Wt 214 lb 3.2 oz (97.2 kg)   SpO2 95%   BMI 37.94 kg/m   Wt Readings from Last 3 Encounters:  11/02/20 214 lb 3.2 oz (97.2 kg)  09/14/20 207 lb 6 oz (94.1 kg)  04/26/20 211 lb (95.7 kg)    Physical Exam Vitals and nursing note reviewed.  Constitutional:      General: She is not in acute distress.    Appearance: Normal appearance. She is not ill-appearing, toxic-appearing or diaphoretic.  HENT:     Head: Normocephalic and atraumatic.     Right Ear: External ear normal.     Left Ear: External ear normal.     Nose: Nose  normal.     Mouth/Throat:     Mouth: Mucous membranes are moist.     Pharynx: Oropharynx is clear.  Eyes:     General: No scleral icterus.       Right eye: No discharge.        Left eye: No discharge.     Extraocular Movements: Extraocular movements intact.     Conjunctiva/sclera: Conjunctivae normal.     Pupils: Pupils are equal, round, and reactive to light.  Cardiovascular:     Rate and Rhythm: Normal rate and regular rhythm.     Pulses: Normal pulses.     Heart sounds: Normal heart sounds. No murmur heard.   No friction rub. No gallop.  Pulmonary:     Effort: Pulmonary effort is normal. No respiratory distress.     Breath sounds: Normal breath sounds. No stridor. No wheezing, rhonchi or rales.  Chest:     Chest wall: No tenderness.  Musculoskeletal:        General: Tenderness present. No swelling, deformity or signs of injury.     Cervical back: Normal range of motion and neck supple.     Left lower leg: No edema.  Skin:    General: Skin is warm and dry.     Capillary Refill:  Capillary refill takes less than 2 seconds.     Coloration: Skin is not jaundiced or pale.     Findings: No bruising, erythema, lesion or rash.  Neurological:     General: No focal deficit present.     Mental Status: She is alert and oriented to person, place, and time. Mental status is at baseline.     Gait: Gait abnormal.  Psychiatric:        Mood and Affect: Mood normal.        Behavior: Behavior normal.        Thought Content: Thought content normal.        Judgment: Judgment normal.    Results for orders placed or performed in visit on 04/26/20  Celiac Disease Ab Screen w/Rfx  Result Value Ref Range   Antigliadin Abs, IgA 4 0 - 19 units   Transglutaminase IgA <2 0 - 3 U/mL   IgA/Immunoglobulin A, Serum 380 (H) 87 - 352 mg/dL      Assessment & Plan:   Problem List Items Addressed This Visit   None Visit Diagnoses     Acute right-sided low back pain without sciatica    -  Primary    Toradol shot today. Stretches, baclofen and prednisone given. Call if not getting better and we'll get x-rays/PT. Continue to monitor.    Relevant Medications   baclofen (LIORESAL) 10 MG tablet   predniSONE (DELTASONE) 10 MG tablet   ketorolac (TORADOL) injection 60 mg   Insurance coverage problems       Relevant Orders   AMB Referral to [facility]        Follow up plan: Return august physical.

## 2020-11-08 ENCOUNTER — Other Ambulatory Visit: Payer: Self-pay | Admitting: Family Medicine

## 2020-11-08 NOTE — Telephone Encounter (Signed)
Pt is scheduled 8/23

## 2020-11-09 ENCOUNTER — Telehealth: Payer: Self-pay | Admitting: Internal Medicine

## 2020-11-09 NOTE — Telephone Encounter (Signed)
   Telephone encounter was:  Successful.  11/09/2020 Name: [name] MRN: [id number] DOB: [birth year]  [name] is a 65 y.o. year old female who is a primary care patient of Vigg, Avanti, MD . The community resource team was consulted for assistance with  applying for Medicare.  Care guide performed the following interventions: Patient provided with information about care guide support team and interviewed to confirm resource needs.  Follow Up Plan:  Care guide will follow up with patient by phone over the next two days with Medicare resources.  April Green Care Guide, Embedded Care Coordination [facility], Care Management Phone: [phone] Email: [email or web address]

## 2020-12-01 ENCOUNTER — Telehealth: Payer: Self-pay | Admitting: Internal Medicine

## 2020-12-01 NOTE — Telephone Encounter (Signed)
   Telephone encounter was:  Unsuccessful.  12/01/2020 Name: [name] MRN: [id number] DOB: 26-Apr-1956  Unsuccessful outbound call made today to assist with:   applying for Medicare.  Outreach Attempt:  1st Attempt  A HIPAA compliant voice message was left requesting a return call.  Instructed patient to call back at [phone]. Just calling patient to advise I sent email with resources requested to assist with applying for Medicare. No further action needed.   April Green Care Guide, Embedded Care Coordination [facility], Care Management Phone: [phone] Email: [email or web address]

## 2020-12-13 ENCOUNTER — Ambulatory Visit: Payer: 59 | Admitting: Family Medicine

## 2020-12-13 ENCOUNTER — Ambulatory Visit
Admission: RE | Admit: 2020-12-13 | Discharge: 2020-12-13 | Disposition: A | Discharge status: Home / Self Care | Payer: 59 | Source: Ambulatory Visit | Attending: Family Medicine | Admitting: Family Medicine

## 2020-12-13 ENCOUNTER — Other Ambulatory Visit: Payer: Self-pay

## 2020-12-13 ENCOUNTER — Ambulatory Visit
Admission: RE | Admit: 2020-12-13 | Discharge: 2020-12-13 | Disposition: A | Discharge status: Home / Self Care | Payer: 59 | Attending: Family Medicine | Admitting: Family Medicine

## 2020-12-13 ENCOUNTER — Encounter: Payer: Self-pay | Admitting: Family Medicine

## 2020-12-13 VITALS — BP 126/88 | HR 77 | Temp 98.3°F | Wt 208.4 lb

## 2020-12-13 DIAGNOSIS — M545 Low back pain, unspecified: Secondary | ICD-10-CM

## 2020-12-13 DIAGNOSIS — G4733 Obstructive sleep apnea (adult) (pediatric): Secondary | ICD-10-CM | POA: Diagnosis not present

## 2020-12-13 NOTE — Progress Notes (Signed)
BP 126/88 (BP Location: Left Arm, Cuff Size: Normal)   Pulse 77   Temp 98.3 F (36.8 C) (Oral)   Wt 208 lb 6.4 oz (94.5 kg)   SpO2 96%   BMI 36.92 kg/m    Subjective:    [name], female    DOB: January 09, 1956, 65 y.o.   MRN: [id number]  HPI: [name] is a 65 y.o. female  Chief Complaint  Patient presents with   Back Pain    Pt states she is still having back pain, rating it at a 7 today   Sleep Apnea   BACK PAIN Duration: chronic, worse in the past 8 weeks Mechanism of injury:  sneezed and her back went out Location: R low back Onset: sudden Severity: severe Quality: sharp, throbbing and burning Frequency: constant Radiation: into L side Aggravating factors: lifting, movement, walking, laying, bending, and prolonged sitting Alleviating factors: rest, ice, NSAIDs, and muscle relaxer Status: stable Treatments attempted:  lidocaine, prednisone, balofen, rest, ice, heat, APAP, ibuprofen, and aleve  Relief with NSAIDs?: mild Nighttime pain:  yes Paresthesias / decreased sensation:  yes Bowel / bladder incontinence:  no Fevers:  no Dysuria / urinary frequency:  no  SLEEP APNEA Sleep apnea status: controlled Duration: chronic Satisfied with current treatment?:  yes CPAP use:  yes Sleep quality with CPAP use: excellent Treament compliance:excellent compliance Wakes feeling refreshed:  yes Daytime hypersomnolence:  no Fatigue:  yes Insomnia:  yes Good sleep hygiene:  yes Difficulty falling asleep:  yes Difficulty staying asleep:  yes Snoring bothers bed partner:  no Observed apnea by bed partner: no Obesity:  yes Hypertension: no  Pulmonary hypertension:  no Coronary artery disease:  no  Relevant past medical, surgical, family and social history reviewed and updated as indicated. Interim medical history since our last visit reviewed. Allergies and medications reviewed and updated.  Review of Systems  Constitutional: Negative.    Respiratory: Negative.    Cardiovascular: Negative.   Gastrointestinal: Negative.   Musculoskeletal:  Positive for back pain and myalgias. Negative for arthralgias, gait problem, joint swelling, neck pain and neck stiffness.  Skin: Negative.   Neurological: Negative.   Psychiatric/Behavioral: Negative.     Per HPI unless specifically indicated above     Objective:    BP 126/88 (BP Location: Left Arm, Cuff Size: Normal)   Pulse 77   Temp 98.3 F (36.8 C) (Oral)   Wt 208 lb 6.4 oz (94.5 kg)   SpO2 96%   BMI 36.92 kg/m   Wt Readings from Last 3 Encounters:  12/13/20 208 lb 6.4 oz (94.5 kg)  11/02/20 214 lb 3.2 oz (97.2 kg)  09/14/20 207 lb 6 oz (94.1 kg)    Physical Exam Vitals and nursing note reviewed.  Constitutional:      General: She is not in acute distress.    Appearance: Normal appearance. She is not ill-appearing, toxic-appearing or diaphoretic.  HENT:     Head: Normocephalic and atraumatic.     Right Ear: External ear normal.     Left Ear: External ear normal.     Nose: Nose normal.     Mouth/Throat:     Mouth: Mucous membranes are moist.     Pharynx: Oropharynx is clear.  Eyes:     General: No scleral icterus.       Right eye: No discharge.        Left eye: No discharge.     Extraocular Movements: Extraocular movements intact.  Conjunctiva/sclera: Conjunctivae normal.     Pupils: Pupils are equal, round, and reactive to light.  Cardiovascular:     Rate and Rhythm: Normal rate and regular rhythm.     Pulses: Normal pulses.     Heart sounds: Normal heart sounds. No murmur heard.   No friction rub. No gallop.  Pulmonary:     Effort: Pulmonary effort is normal. No respiratory distress.     Breath sounds: Normal breath sounds. No stridor. No wheezing, rhonchi or rales.  Chest:     Chest wall: No tenderness.  Musculoskeletal:        General: Normal range of motion.     Cervical back: Normal range of motion and neck supple.  Skin:    General: Skin is  warm and dry.     Capillary Refill: Capillary refill takes less than 2 seconds.     Coloration: Skin is not jaundiced or pale.     Findings: No bruising, erythema, lesion or rash.  Neurological:     General: No focal deficit present.     Mental Status: She is alert and oriented to person, place, and time. Mental status is at baseline.  Psychiatric:        Mood and Affect: Mood normal.        Behavior: Behavior normal.        Thought Content: Thought content normal.        Judgment: Judgment normal.    Results for orders placed or performed in visit on 04/26/20  Celiac Disease Ab Screen w/Rfx  Result Value Ref Range   Antigliadin Abs, IgA 4 0 - 19 units   Transglutaminase IgA <2 0 - 3 U/mL   IgA/Immunoglobulin A, Serum 380 (H) 87 - 352 mg/dL      Assessment & Plan:   Problem List Items Addressed This Visit       Respiratory   OSA (obstructive sleep apnea)    Tolerating her CPAP well. Continue her CPAP. Call with any concerns.        Other Visit Diagnoses     Acute right-sided low back pain without sciatica    -  Primary   Not better. Will get x-rays and get her into PT. If not getting better or getting worse let us know.    Relevant Orders   DG Lumbar Spine Complete   Ambulatory referral to Physical Therapy        Follow up plan: Return for about 6 weeks- welcome to medicare, 40 minutes.

## 2020-12-13 NOTE — Patient Instructions (Signed)
April Green [phone]

## 2020-12-13 NOTE — Assessment & Plan Note (Signed)
Tolerating her CPAP well. Continue her CPAP. Call with any concerns.

## 2020-12-27 ENCOUNTER — Ambulatory Visit: Payer: 59 | Attending: Family Medicine | Admitting: Physical Therapy

## 2020-12-27 ENCOUNTER — Encounter: Payer: 59 | Admitting: Internal Medicine

## 2020-12-27 DIAGNOSIS — R293 Abnormal posture: Secondary | ICD-10-CM | POA: Diagnosis present

## 2020-12-27 DIAGNOSIS — M6281 Muscle weakness (generalized): Secondary | ICD-10-CM | POA: Insufficient documentation

## 2020-12-27 DIAGNOSIS — R262 Difficulty in walking, not elsewhere classified: Secondary | ICD-10-CM | POA: Diagnosis present

## 2020-12-27 DIAGNOSIS — M545 Low back pain, unspecified: Secondary | ICD-10-CM

## 2020-12-27 DIAGNOSIS — M546 Pain in thoracic spine: Secondary | ICD-10-CM | POA: Diagnosis not present
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
## 2020-12-28 ENCOUNTER — Encounter: Payer: Self-pay | Admitting: Physical Therapy

## 2020-12-28 NOTE — Addendum Note (Signed)
Addended by: Norton Blizzard R on: 12/28/2020 08:24 PM   Modules accepted: Orders

## 2021-01-02 ENCOUNTER — Ambulatory Visit: Payer: 59 | Admitting: Physical Therapy

## 2021-01-02 ENCOUNTER — Encounter: Payer: Self-pay | Admitting: Physical Therapy

## 2021-01-02 DIAGNOSIS — R293 Abnormal posture: Secondary | ICD-10-CM

## 2021-01-02 DIAGNOSIS — M6281 Muscle weakness (generalized): Secondary | ICD-10-CM

## 2021-01-02 DIAGNOSIS — M546 Pain in thoracic spine: Secondary | ICD-10-CM | POA: Diagnosis not present

## 2021-01-02 DIAGNOSIS — R262 Difficulty in walking, not elsewhere classified: Secondary | ICD-10-CM

## 2021-01-02 DIAGNOSIS — M545 Low back pain, unspecified: Secondary | ICD-10-CM

## 2021-01-02 NOTE — Therapy (Signed)
[facility] PHYSICAL AND SPORTS MEDICINE [address] [place], [address] Phone: [phone]   Fax:  [phone]  Physical Therapy Treatment  Patient Details  Name: [name] MRN: [id number] Date of Birth: 1955-08-15 Referring Provider (PT): Olevia Perches, DO.   Encounter Date: 01/02/2021   PT End of Session - 01/02/21 1919     Visit Number 2    Number of Visits 24    Date for PT Re-Evaluation 03/22/21    Authorization Type [employer] (Reporting Period From 12/27/2020)    Progress Note Due on Visit 10    PT Start Time 1600    PT Stop Time 1640    PT Time Calculation (min) 40 min    Activity Tolerance Patient tolerated treatment well;Patient limited by pain    Behavior During Therapy [facility] for tasks assessed/performed             Past Medical History:  Diagnosis Date   Anxiety state, unspecified    Back pain    Chest pain    a. 2008 Cath: reportedly nl;  b. 09/2012 Lexi MV: EF 75%, soft tissue attenuation->Low risk.   COPD (chronic obstructive pulmonary disease) (HCC)    CTS (carpal tunnel syndrome)    Depression    GERD (gastroesophageal reflux disease)    Migraines    Neck pain    Nontoxic multinodular goiter    Other and unspecified hyperlipidemia    Other and unspecified ovarian cyst    left   Other chest pain    PAF (paroxysmal atrial fibrillation) (HCC)    a. 07/2007 Echo: EF 65%, mildly dil LA;  b. currently on propafenone;  c. CHA2DS2VASc = 1 (gender)-->No anticoagulation.   PTSD (post-traumatic stress disorder)    Tobacco use    Tobacco use disorder    Unspecified hypothyroidism    Unspecified sleep apnea    resolved s/p UPPP surgery   Vitamin B12 deficiency     Past Surgical History:  Procedure Laterality Date   bladder tac     CARDIAC CATHETERIZATION  08/12/06   neg. Dr. Welton Flakes, cardio   CHOLECYSTECTOMY     FOOT SURGERY     bone removal from her foot   MRI lumber spine     03/12/06-abn   MRI  pelvis  05/02/06   abn-[place], neurosurg   NASAL POLYP SURGERY     nasal ?   PARATHYROIDECTOMY     stress cardiolite  10/06/96   nl, EF 62%   stress myoview  10/25/03   Dr. Park Breed, nl   THYROIDECTOMY  5/92   massive goiter   TONSILLECTOMY     TUBAL LIGATION  1988   UVULOPALATOPHARYNGOPLASTY     and tonsilectomy. [name] 10/04    There were no vitals filed for this visit.   Subjective Assessment - 01/02/21 1917     Subjective Patient reports that she has been okay since last appointment. She states that "every now and again I have to lay down" due to back pain. Patient currently reports 6/10 pain in bilateral thoracic and lumbar back.    Pertinent History Patient is a 65 year old female who presents to outpatient physical therapy with a referral for acute right sided low back pain without sciatica. This patient's chief complaints consist of thoracic and lumbar back pain, leading to the following functional deficits: difficulty with walking, sitting, standing, carrying objects, lifting, going up and down stairs, getting in/out of the car,  performing ADLs such as bathing, showering, getting dressed. Relevant past medical history and comorbidities include scoliosis, OSA, anxiety, chest pain, COPD, depression, GERD, migraines, neck pain, hyperlipidemia, paroxysmal atrial fibrilation, hypothyroidism,  PTSD, tobacco use, removal of thyroid and parathyroid glands. Patient denies history of dizziness, double vision, cancer, bowel and bladder changes, numbness or tingling in groin area, stroke, seizure, unexplained weight loss, and lung or major heart problems.    Limitations Sitting;Lifting;Walking;Standing;House hold activities   Difficulty with walking, sitting, standing, carrying objects, lifting, going up and down stairs, getting in/out of the car, performing ADLs such as bathing, showering, getting dressed   How long can you walk comfortably? Unable to walk for a few blocks or into a store due  to back pain    Diagnostic tests X-Ray (12/14/2020)    Patient Stated Goals Patient would like to "be more comfortable"    Currently in Pain? Yes    Pain Score 6     Pain Location Back    Pain Type Chronic pain    Pain Onset More than a month ago    Pain Frequency Constant              TREATMENT:  Manual Therapy: to increase joint mobility, support increased overall ROM, and provide pain relief to joint musculature.  PRONE CPA grade III-IV from L1 - L5. Patient report decrease in concordant pain with joint mobilizations/repeated mobilization. STM to right latissimus dorsi, rhomboids, middle/lower traps, and spinal extensors  Therapeutic exercise: to centralize symptoms and improve ROM, strength, muscular endurance, and activity tolerance required for successful completion of functional activities.   Sidelying open book, 1x15 performed bilaterally Wall angels 1x10. Discontinued due to pain.  Standing doorway pec stretch x5 with 15 second hold.  Standing y's with wall for guide - 3x10  Education on HEP including handout    Pt required multimodal cuing for proper technique and to facilitate improved neuromuscular control, strength, range of motion, and functional ability resulting in improved performance and form.    HOME EXERCISE PLAN: Access Code: [email or web address] URL: [email or web address] Date: 01/02/2021 Prepared by: Norton BlizzardSara Snyder  Exercises  Sidelying Thoracic Rotation with Open Book - 1 x daily - 1 sets - 15 reps Low Trap Setting at Wall - 1 x daily - 3 sets - 10 reps Standing Row with Anchored Resistance - 1 x daily - 4 sets - 10 reps       PT Education - 01/02/21 1918     Education Details HEP instruction and eduction, exercise purpose and form    Person(s) Educated Patient    Methods Explanation;Demonstration;Tactile cues;Verbal cues    Comprehension Verbalized understanding;Returned demonstration;Tactile cues required;Verbal cues required               PT Short Term Goals - 12/28/20 1347       PT SHORT TERM GOAL #1   Title Patient will be independent with initial home program for self-management of symptoms and to improve strength/mobility and functional independence with ADLs.    Baseline HEP to be provided at 2nd session    Time 3    Period Weeks    Status New    Target Date 01/18/21               PT Long Term Goals - 12/28/20 1348       PT LONG TERM GOAL #1   Title Patient will be independent with long term home program for self-management of symptoms and to improve strength/mobility  and functional independence with ADLs.    Baseline Patient to be provided with initial HEP at next session    Time 12    Period Weeks    Status New   TARGET DATE FOR ALL LONG TERM GOALS:  03/22/2021   Target Date 03/22/21      PT LONG TERM GOAL #2   Title Demonstrate improved FOTO score to 56 or greater to demonstrate improvement in overall condition and self-reported functional ability.    Baseline 40 (12/27/2020)    Time 12    Period Weeks    Status New      PT LONG TERM GOAL #3   Title Reduce pain with functional activities to equal or less than 3/10 to allow patient to complete usual activities including ADLs, IADLs, reacreational activities, social engagements, and usual household work activities with less difficulty.    Baseline W: 10/10.      PT LONG TERM GOAL #4   Title Patient will have full lumbar AROM with no compensations or increase in pain in all planes except intermittent end range discomfort to allow patient to complete valued activities with less difficulty.    Baseline Limited lumbar AROM with reporduction of concordant pain in all planes (12/27/2020)    Time 12    Period Weeks    Status New      PT LONG TERM GOAL #5   Title Patient will improve LE strength to 5/5 for improved general mobility, strength, and function.    Baseline 3/5 strength in bilateral hips - limited by pain    Time 12    Period Weeks     Status New      Additional Long Term Goals   Additional Long Term Goals Yes      PT LONG TERM GOAL #6   Title Patient will be able to ambulate 1500 feet in 6 minutes in order to demonstrate improvements in activity tolerance and community ambulation.    Baseline Patient reports difficulty walking a few blocks - 6 minute walk test to be measured at subsequent session.    Time 12    Period Weeks    Status New                   Plan - 01/02/21 1920     Clinical Impression Statement Patient tolerated treatment well with no lasting complaint of pain or discomfort by end of session. Patient responded well to manual work and thoracic mobility per patient report of decreased symptoms. However, patient continues to demonstrated limited activity tolerance due to pain, decreased postural strength, and hypobility of thoracic spine. Patient will continue to benefit from skilled physical therapy intervention to address impairments, improve patient pain, and maximize functional ability.    Personal Factors and Comorbidities Profession;Time since onset of injury/illness/exacerbation;Comorbidity 3+;Past/Current Experience;Fitness    Comorbidities scoliosis, OSA, anxiety, chest pain, COPD, depression, GERD, migraines, neck pain, hyperlipidemia, paroxysmal atrial fibrilation, hypothyroidism,  PTSD, tobacco use    Examination-Activity Limitations Bathing;Carry;Lift;Sit;Stand;Bed Mobility;Locomotion Level;Sleep;Bend;Dressing;Reach Overhead;Squat;Transfers;Hygiene/Grooming;Self Feeding;Stairs   Difficulty with walking, sitting, standing, carrying objects, lifting, going up and down stairs, getting in/out of the car, performing ADLs such as bathing, showering, getting dressed   Examination-Participation Restrictions Cleaning;Yard Work;Occupation;Community Activity;Laundry;Meal Prep    Stability/Clinical Decision Making Stable/Uncomplicated    Rehab Potential Fair    PT Frequency 2x / week    PT Duration 12  weeks    PT Treatment/Interventions ADLs/Self Care Home Management;Electrical Stimulation;Cryotherapy;Traction;Moist Heat;Gait training;Stair training;Functional mobility training;Balance  training;Therapeutic activities;Therapeutic exercise;Neuromuscular re-education;Patient/family education;Manual techniques;Passive range of motion;Dry needling;Spinal Manipulations;Joint Manipulations;Aquatic Therapy    PT Next Visit Plan Plan to progress LE/back strengtheing as tolerated.    PT Home Exercise Plan Medbridge code: [email or web address].  progress as tolerated.    Consulted and Agree with Plan of Care Patient             Patient will benefit from skilled therapeutic intervention in order to improve the following deficits and impairments:  Abnormal gait, Decreased activity tolerance, Decreased endurance, Decreased range of motion, Decreased strength, Hypomobility, Impaired perceived functional ability, Impaired UE functional use, Improper body mechanics, Pain, Obesity, Postural dysfunction, Impaired flexibility, Decreased mobility, Difficulty walking  Visit Diagnosis: Pain in thoracic spine  Acute bilateral low back pain, unspecified whether sciatica present  Difficulty in walking, not elsewhere classified  Muscle weakness (generalized)  Abnormal posture     Problem List Patient Active Problem List   Diagnosis Date Noted   Scoliosis 08/02/2015   Chronic fatigue 02/06/2015   CKD (chronic kidney disease) stage 3, GFR 30-59 ml/min (HCC) 02/06/2015   Nasal polyp, posterior 02/03/2015   Muscle spasms of head and/or neck 11/11/2014   Neck pain of over 3 months duration 10/21/2014   Somatic dysfunction of head region 10/21/2014   GERD (gastroesophageal reflux disease)    Hypothyroidism    Migraines    Depression, major, recurrent, moderate (HCC) 07/20/2014    Class: Chronic   OSA (obstructive sleep apnea) 02/15/2014   Atrial fibrillation (HCC) 08/29/2012   Dyspnea on exertion 08/29/2012    DEGENERATIVE JOINT DISEASE 02/25/2008   Hyperlipidemia 01/29/2007   COPD (chronic obstructive pulmonary disease) (HCC) 01/29/2007   Anxiety state 01/28/2007   OVARIAN CYST, LEFT 05/14/2006    Vanessa Barbara, SPT  Student physical therapist under direct supervision of licensed physical therapists during the entirety of the session.   Luretha Murphy. Ilsa Iha, PT, DPT 01/02/21, 8:27 PM   [facility] PHYSICAL AND SPORTS MEDICINE [address] [place], [address] Phone: [phone]   Fax:  [phone]  Name: [name] MRN: [id number] Date of Birth: [birth year]

## 2021-01-03 ENCOUNTER — Encounter: Payer: 59 | Admitting: Physical Therapy

## 2021-01-05 ENCOUNTER — Ambulatory Visit: Payer: Medicare Other | Attending: Family Medicine

## 2021-01-05 ENCOUNTER — Encounter: Payer: 59 | Admitting: Physical Therapy

## 2021-01-05 DIAGNOSIS — M545 Low back pain, unspecified: Secondary | ICD-10-CM

## 2021-01-05 DIAGNOSIS — M6281 Muscle weakness (generalized): Secondary | ICD-10-CM

## 2021-01-05 DIAGNOSIS — R293 Abnormal posture: Secondary | ICD-10-CM

## 2021-01-05 DIAGNOSIS — M546 Pain in thoracic spine: Secondary | ICD-10-CM | POA: Insufficient documentation

## 2021-01-05 DIAGNOSIS — R262 Difficulty in walking, not elsewhere classified: Secondary | ICD-10-CM | POA: Insufficient documentation

## 2021-01-05 NOTE — Therapy (Signed)
[facility] PHYSICAL AND SPORTS MEDICINE [address] [place], [place], [address] Phone: [phone]   Fax:  [phone]  Physical Therapy Treatment  Patient Details  Name: Luiz Blarelthea S Riddles MRN: [id number] Date of Birth: 07/18/1955 Referring Provider (PT): Olevia PerchesMegan Johnson, DO.   Encounter Date: 01/05/2021   PT End of Session - 01/05/21 1650     Visit Number 3    Number of Visits 24    Date for PT Re-Evaluation 03/22/21    Authorization Type [employer] (Reporting Period From 12/27/2020)    Progress Note Due on Visit 10    PT Start Time 1606    PT Stop Time 1645    PT Time Calculation (min) 39 min    Activity Tolerance Patient tolerated treatment well;Patient limited by pain    Behavior [facility] for tasks assessed/performed             Past Medical History:  Diagnosis Date   Anxiety state, unspecified    Back pain    Chest pain    a. 2008 Cath: reportedly nl;  b. 09/2012 Lexi MV: EF 75%, soft tissue attenuation->Low risk.   COPD (chronic obstructive pulmonary disease) (HCC)    CTS (carpal tunnel syndrome)    Depression    GERD (gastroesophageal reflux disease)    Migraines    Neck pain    Nontoxic multinodular goiter    Other and unspecified hyperlipidemia    Other and unspecified ovarian cyst    left   Other chest pain    PAF (paroxysmal atrial fibrillation) (HCC)    a. 07/2007 Echo: EF 65%, mildly dil LA;  b. currently on propafenone;  c. CHA2DS2VASc = 1 (gender)-->No anticoagulation.   PTSD (post-traumatic stress disorder)    Tobacco use    Tobacco use disorder    Unspecified hypothyroidism    Unspecified sleep apnea    resolved s/p UPPP surgery   Vitamin B12 deficiency     Past Surgical History:  Procedure Laterality Date   bladder tac     CARDIAC CATHETERIZATION  08/12/06   neg. Dr. Welton FlakesKhan, cardio   CHOLECYSTECTOMY     FOOT SURGERY     bone removal from her foot   MRI lumber spine     03/12/06-abn   MRI  pelvis  05/02/06   abn-Bortero, neurosurg   NASAL POLYP SURGERY     nasal ?   PARATHYROIDECTOMY     stress cardiolite  10/06/96   nl, EF 62%   stress myoview  10/25/03   Dr. Park BreedKahn, nl   THYROIDECTOMY  5/92   massive goiter   TONSILLECTOMY     TUBAL LIGATION  1988   UVULOPALATOPHARYNGOPLASTY     and tonsilectomy. [name] 10/04    There were no vitals filed for this visit.   Subjective Assessment - 01/05/21 1648     Subjective Patient states that her back is feeling better overall. However, patient continues to report burning R low back pain on right side. Patient did not provide pain rating. Patient reports HEP compliant and believes that exercise help manage her pain.    Pertinent History Patient is a 65 year old female who presents to outpatient physical therapy with a referral for acute right sided low back pain without sciatica. This patient's chief complaints consist of thoracic and lumbar back pain, leading to the following functional deficits: difficulty with walking, sitting, standing, carrying objects, lifting, going up and down stairs, getting in/out  of the car, performing ADLs such as bathing, showering, getting dressed. Relevant past medical history and comorbidities include scoliosis, OSA, anxiety, chest pain, COPD, depression, GERD, migraines, neck pain, hyperlipidemia, paroxysmal atrial fibrilation, hypothyroidism,  PTSD, tobacco use, removal of thyroid and parathyroid glands. Patient denies history of dizziness, double vision, cancer, bowel and bladder changes, numbness or tingling in groin area, stroke, seizure, unexplained weight loss, and lung or major heart problems.    Limitations Sitting;Lifting;Walking;Standing;House hold activities   Difficulty with walking, sitting, standing, carrying objects, lifting, going up and down stairs, getting in/out of the car, performing ADLs such as bathing, showering, getting dressed   How long can you walk comfortably? Unable to walk  for a few blocks or into a store due to back pain    Diagnostic tests X-Ray (12/14/2020)    Patient Stated Goals Patient would like to "be more comfortable"    Currently in Pain? Yes    Pain Orientation Lower;Right    Pain Descriptors / Indicators Burning    Pain Type Chronic pain    Pain Onset More than a month ago    Pain Frequency Constant             TREATMENT:  Manual Therapy: to increase joint mobility, support increased overall ROM, and provide pain relief to joint musculature.  PRONE CPA grade III-IV from L1 - L5. Patient report decrease in concordant pain with joint mobilizations/repeated mobilization. STM to right latissimus dorsi, rhomboids, middle/lower traps, and spinal extensors   Therapeutic exercise: to centralize symptoms and improve ROM, strength, muscular endurance, and activity tolerance required for successful completion of functional activities.   Sidelying open book, 2x15 performed bilaterally Supine glute bridge with posterior pelvic tilt, 3x10  Standing doorway pec stretch x5 with 15 second hold  Sit to stands 2x10 from 18.5 plinth table  Sit to stands from 18.5 plinth table holding 3lb weight at chest, 3x15 Standing rows with 15lb cable, 1x15   Pt required multimodal cuing for proper technique and to facilitate improved neuromuscular control, strength, range of motion, and functional ability resulting in improved performance and form.      HOME EXERCISE PLAN: Access Code: [email or web address] URL: [email or web address] Date: 01/02/2021 Prepared by: Norton Blizzard  Exercises   Sidelying Thoracic Rotation with Open Book - 1 x daily - 1 sets - 15 reps Low Trap Setting at Wall - 1 x daily - 3 sets - 10 reps Standing Row with Anchored Resistance - 1 x daily - 4 sets - 10 reps   PT Education - 01/05/21 1650     Education Details exercise purpose and form    Person(s) Educated Patient    Methods Explanation;Demonstration;Tactile cues;Verbal cues     Comprehension Verbalized understanding;Returned demonstration;Verbal cues required;Tactile cues required              PT Short Term Goals - 12/28/20 1347       PT SHORT TERM GOAL #1   Title Patient will be independent with initial home program for self-management of symptoms and to improve strength/mobility and functional independence with ADLs.    Baseline HEP to be provided at 2nd session    Time 3    Period Weeks    Status New    Target Date 01/18/21               PT Long Term Goals - 12/28/20 1348       PT LONG TERM GOAL #1   Title Patient will be independent  with long term home program for self-management of symptoms and to improve strength/mobility and functional independence with ADLs.    Baseline Patient to be provided with initial HEP at next session    Time 12    Period Weeks    Status New   TARGET DATE FOR ALL LONG TERM GOALS:  03/22/2021   Target Date 03/22/21      PT LONG TERM GOAL #2   Title Demonstrate improved FOTO score to 56 or greater to demonstrate improvement in overall condition and self-reported functional ability.    Baseline 40 (12/27/2020)    Time 12    Period Weeks    Status New      PT LONG TERM GOAL #3   Title Reduce pain with functional activities to equal or less than 3/10 to allow patient to complete usual activities including ADLs, IADLs, reacreational activities, social engagements, and usual household work activities with less difficulty.    Baseline W: 10/10.      PT LONG TERM GOAL #4   Title Patient will have full lumbar AROM with no compensations or increase in pain in all planes except intermittent end range discomfort to allow patient to complete valued activities with less difficulty.    Baseline Limited lumbar AROM with reporduction of concordant pain in all planes (12/27/2020)    Time 12    Period Weeks    Status New      PT LONG TERM GOAL #5   Title Patient will improve LE strength to 5/5 for improved general mobility,  strength, and function.    Baseline 3/5 strength in bilateral hips - limited by pain    Time 12    Period Weeks    Status New      Additional Long Term Goals   Additional Long Term Goals Yes      PT LONG TERM GOAL #6   Title Patient will be able to ambulate 1500 feet in 6 minutes in order to demonstrate improvements in activity tolerance and community ambulation.    Baseline Patient reports difficulty walking a few blocks - 6 minute walk test to be measured at subsequent session.    Time 12    Period Weeks    Status New                   Plan - 01/05/21 1650     Clinical Impression Statement Session impacted by patient late arrival to PT. Patient responded well to manual work and therapeutic activity per patient report of decreased pain. However, overhead UE strengthening, and mobility activities aggravate patient symptoms. Patient continues to demonstrate impaired back and hip strength which likely contributes to low back pain. Patient responded well to progression of sit to stand with incorporation of weight. Patient will continue to benefit from skilled physical therapy intervention to address impairments and maximize functional mobility.    Personal Factors and Comorbidities Profession;Time since onset of injury/illness/exacerbation;Comorbidity 3+;Past/Current Experience;Fitness    Comorbidities scoliosis, OSA, anxiety, chest pain, COPD, depression, GERD, migraines, neck pain, hyperlipidemia, paroxysmal atrial fibrilation, hypothyroidism,  PTSD, tobacco use    Examination-Activity Limitations Bathing;Carry;Lift;Sit;Stand;Bed Mobility;Locomotion Level;Sleep;Bend;Dressing;Reach Overhead;Squat;Transfers;Hygiene/Grooming;Self Feeding;Stairs   Difficulty with walking, sitting, standing, carrying objects, lifting, going up and down stairs, getting in/out of the car, performing ADLs such as bathing, showering, getting dressed   Examination-Participation Restrictions Cleaning;Yard  Work;Occupation;Community Activity;Laundry;Meal Prep    Stability/Clinical Decision Making Stable/Uncomplicated    Rehab Potential Fair    PT Frequency 2x / week  PT Duration 12 weeks    PT Treatment/Interventions ADLs/Self Care Home Management;Electrical Stimulation;Cryotherapy;Traction;Moist Heat;Gait training;Stair training;Functional mobility training;Balance training;Therapeutic activities;Therapeutic exercise;Neuromuscular re-education;Patient/family education;Manual techniques;Passive range of motion;Dry needling;Spinal Manipulations;Joint Manipulations;Aquatic Therapy    PT Next Visit Plan Plan to progress LE/back strengtheing as tolerated.    PT Home Exercise Plan Medbridge code: [email or web address].  progress as tolerated.    Consulted and Agree with Plan of Care Patient             Patient will benefit from skilled therapeutic intervention in order to improve the following deficits and impairments:  Abnormal gait, Decreased activity tolerance, Decreased endurance, Decreased range of motion, Decreased strength, Hypomobility, Impaired perceived functional ability, Impaired UE functional use, Improper body mechanics, Pain, Obesity, Postural dysfunction, Impaired flexibility, Decreased mobility, Difficulty walking  Visit Diagnosis: Pain in thoracic spine  Acute bilateral low back pain, unspecified whether sciatica present  Difficulty in walking, not elsewhere classified  Muscle weakness (generalized)  Abnormal posture     Problem List Patient Active Problem List   Diagnosis Date Noted   Scoliosis 08/02/2015   Chronic fatigue 02/06/2015   CKD (chronic kidney disease) stage 3, GFR 30-59 ml/min (HCC) 02/06/2015   Nasal polyp, posterior 02/03/2015   Muscle spasms of head and/or neck 11/11/2014   Neck pain of over 3 months duration 10/21/2014   Somatic dysfunction of head region 10/21/2014   GERD (gastroesophageal reflux disease)    Hypothyroidism    Migraines    Depression,  major, recurrent, moderate (HCC) 07/20/2014    Class: Chronic   OSA (obstructive sleep apnea) 02/15/2014   Atrial fibrillation (HCC) 08/29/2012   Dyspnea on exertion 08/29/2012   DEGENERATIVE JOINT DISEASE 02/25/2008   Hyperlipidemia 01/29/2007   COPD (chronic obstructive pulmonary disease) (HCC) 01/29/2007   Anxiety state 01/28/2007   OVARIAN CYST, LEFT 05/14/2006   Vanessa Barbara, SPT  Delphia Grates. Fairly IV, PT, DPT Physical Therapist- Center  [facility]  01/05/2021, 5:13 PM  [facility] PHYSICAL AND SPORTS MEDICINE [address] [place], [address] Phone: [phone]   Fax:  [phone]  Name: RUBEN PYKA MRN: [id number] Date of Birth: 15-Feb-1956

## 2021-01-10 ENCOUNTER — Ambulatory Visit: Payer: Medicare Other | Admitting: Physical Therapy

## 2021-01-10 ENCOUNTER — Encounter: Payer: Self-pay | Admitting: Physical Therapy

## 2021-01-10 DIAGNOSIS — M546 Pain in thoracic spine: Secondary | ICD-10-CM

## 2021-01-10 DIAGNOSIS — M6281 Muscle weakness (generalized): Secondary | ICD-10-CM

## 2021-01-10 DIAGNOSIS — M545 Low back pain, unspecified: Secondary | ICD-10-CM

## 2021-01-10 DIAGNOSIS — R262 Difficulty in walking, not elsewhere classified: Secondary | ICD-10-CM

## 2021-01-10 DIAGNOSIS — R293 Abnormal posture: Secondary | ICD-10-CM

## 2021-01-10 NOTE — Therapy (Signed)
[facility]IC[facility] [address], [place], [address] Phone: [phone]   Fax:  [phone]  Physical Therapy Treatment  Patient Details  Name: [name] MRN: [id number] Date of Birth: Sep 10, 1955 Referring Provider (PT): Olevia Perches, DO.   Encounter Date: 01/10/2021   PT End of Session - 01/10/21 1442     Visit Number 4    Number of Visits 24    Date for PT Re-Evaluation 03/22/21    Authorization Type [employer] (Reporting Period From 12/27/2020)    Progress Note Due on Visit 10    PT Start Time 1437    PT Stop Time 1515    PT Time Calculation (min) 38 min    Activity Tolerance Patient tolerated treatment well;Patient limited by pain    Behavior During Therapy [facility] for tasks assessed/performed             Past Medical History:  Diagnosis Date   Anxiety state, unspecified    Back pain    Chest pain    a. 2008 Cath: reportedly nl;  b. 09/2012 Lexi MV: EF 75%, soft tissue attenuation->Low risk.   COPD (chronic obstructive pulmonary disease) (HCC)    CTS (carpal tunnel syndrome)    Depression    GERD (gastroesophageal reflux disease)    Migraines    Neck pain    Nontoxic multinodular goiter    Other and unspecified hyperlipidemia    Other and unspecified ovarian cyst    left   Other chest pain    PAF (paroxysmal atrial fibrillation) (HCC)    a. 07/2007 Echo: EF 65%, mildly dil LA;  b. currently on propafenone;  c. CHA2DS2VASc = 1 (gender)-->No anticoagulation.   PTSD (post-traumatic stress disorder)    Tobacco use    Tobacco use disorder    Unspecified hypothyroidism    Unspecified sleep apnea    resolved s/p UPPP surgery   Vitamin B12 deficiency     Past Surgical History:  Procedure Laterality Date   bladder tac     CARDIAC CATHETERIZATION  08/12/06   neg. Dr. Welton Flakes, cardio   CHOLECYSTECTOMY     FOOT SURGERY     bone removal from her foot   MRI lumber spine     03/12/06-abn   MRI  pelvis  05/02/06   abn-[facility], neurosurg   NASAL POLYP SURGERY     nasal ?   PARATHYROIDECTOMY     stress cardiolite  10/06/96   nl, EF 62%   stress myoview  10/25/03   Dr. Park Breed, nl   THYROIDECTOMY  5/92   massive goiter   TONSILLECTOMY     TUBAL LIGATION  1988   UVULOPALATOPHARYNGOPLASTY     and tonsilectomy. [name] 10/04    There were no vitals filed for this visit.   Subjective Assessment - 01/10/21 1438     Subjective Patient reports she is feeling well today. States her pain was better after last PT session until she went to bed. States her back is feeling good until it catches. It has has been catching less. Still has a lot of difficulty sleeping. The catching is straight across the lower hip area. Has burning pain over the right lower thoracic spine 6/10. Continues to be hard to sit and work all day on the computer. States she went for a walk yesterday and it went better than it has before recently. Stayed home for her birthday.    Pertinent History Patient  is a 65 year old female who presents to outpatient physical therapy with a referral for acute right sided low back pain without sciatica. This patient's chief complaints consist of thoracic and lumbar back pain, leading to the following functional deficits: difficulty with walking, sitting, standing, carrying objects, lifting, going up and down stairs, getting in/out of the car, performing ADLs such as bathing, showering, getting dressed. Relevant past medical history and comorbidities include scoliosis, OSA, anxiety, chest pain, COPD, depression, GERD, migraines, neck pain, hyperlipidemia, paroxysmal atrial fibrilation, hypothyroidism,  PTSD, tobacco use, removal of thyroid and parathyroid glands. Patient denies history of dizziness, double vision, cancer, bowel and bladder changes, numbness or tingling in groin area, stroke, seizure, unexplained weight loss, and lung or major heart problems.    Limitations  Sitting;Lifting;Walking;Standing;House hold activities   Difficulty with walking, sitting, standing, carrying objects, lifting, going up and down stairs, getting in/out of the car, performing ADLs such as bathing, showering, getting dressed   How long can you walk comfortably? Unable to walk for a few blocks or into a store due to back pain    Diagnostic tests X-Ray (12/14/2020)    Patient Stated Goals Patient would like to "be more comfortable"    Currently in Pain? Yes    Pain Score 6     Pain Onset More than a month ago                 TREATMENT:  Manual Therapy: to increase joint mobility, support increased overall ROM, and provide pain relief to joint musculature.  PRONE CPA grade III-IV from L1 - L5. Patient report decrease in concordant pain with joint mobilizations/repeated mobilization. STM to B latissimus dorsi, rhomboids, middle/lower traps, and spinal extensors B scapular mobilization and distraction (R > L side produces some concordant pain at right lower thoracic spine)    Therapeutic exercise: to centralize symptoms and improve ROM, strength, muscular endurance, and activity tolerance required for successful completion of functional activities.   Prone press up, 1x10 Sidelying open book, 2x15 performed bilaterally Supine glute bridge with posterior pelvic tilt, 2x15  Sit to stands from 18.5 plinth table holding 5lb weight at chest, 3x15   Pt required multimodal cuing for proper technique and to facilitate improved neuromuscular control, strength, range of motion, and functional ability resulting in improved performance and form.      HOME EXERCISE PLAN: Access Code: [email or web address] URL: [email or web address] Date: 01/02/2021 Prepared by: Norton BlizzardSara Latosha Gaylord  Exercises   Sidelying Thoracic Rotation with Open Book - 1 x daily - 1 sets - 15 reps Low Trap Setting at Wall - 1 x daily - 3 sets - 10 reps Standing Row with Anchored Resistance - 1 x daily - 4 sets - 10  reps    PT Education - 01/10/21 1440     Education Details exercise purpose and form    Person(s) Educated Patient    Methods Explanation;Demonstration;Tactile cues;Verbal cues    Comprehension Verbalized understanding;Returned demonstration;Verbal cues required;Tactile cues required;Need further instruction              PT Short Term Goals - 01/10/21 1524       PT SHORT TERM GOAL #1   Title Patient will be independent with initial home program for self-management of symptoms and to improve strength/mobility and functional independence with ADLs.    Baseline HEP to be provided at 2nd session    Time 3    Period Weeks    Status Achieved    Target  Date 01/18/21               PT Long Term Goals - 12/28/20 1348       PT LONG TERM GOAL #1   Title Patient will be independent with long term home program for self-management of symptoms and to improve strength/mobility and functional independence with ADLs.    Baseline Patient to be provided with initial HEP at next session    Time 12    Period Weeks    Status New   TARGET DATE FOR ALL LONG TERM GOALS:  03/22/2021   Target Date 03/22/21      PT LONG TERM GOAL #2   Title Demonstrate improved FOTO score to 56 or greater to demonstrate improvement in overall condition and self-reported functional ability.    Baseline 40 (12/27/2020)    Time 12    Period Weeks    Status New      PT LONG TERM GOAL #3   Title Reduce pain with functional activities to equal or less than 3/10 to allow patient to complete usual activities including ADLs, IADLs, reacreational activities, social engagements, and usual household work activities with less difficulty.    Baseline W: 10/10.      PT LONG TERM GOAL #4   Title Patient will have full lumbar AROM with no compensations or increase in pain in all planes except intermittent end range discomfort to allow patient to complete valued activities with less difficulty.    Baseline Limited lumbar  AROM with reporduction of concordant pain in all planes (12/27/2020)    Time 12    Period Weeks    Status New      PT LONG TERM GOAL #5   Title Patient will improve LE strength to 5/5 for improved general mobility, strength, and function.    Baseline 3/5 strength in bilateral hips - limited by pain    Time 12    Period Weeks    Status New      Additional Long Term Goals   Additional Long Term Goals Yes      PT LONG TERM GOAL #6   Title Patient will be able to ambulate 1500 feet in 6 minutes in order to demonstrate improvements in activity tolerance and community ambulation.    Baseline Patient reports difficulty walking a few blocks - 6 minute walk test to be measured at subsequent session.    Time 12    Period Weeks    Status New                   Plan - 01/10/21 1515     Clinical Impression Statement Patient tolerated treatment well overall and was able to progress more advanced exercises with less pain. Continues to report improved pain and symptoms with manual therapy and felt a lot of relief from scapular mobilizations today. Continues to struggle with activities such as prolonged sitting that negatively affects her sleep and work. Patient would benefit from continued management of limiting condition by skilled physical therapist to address remaining impairments and functional limitations to work towards stated goals and return to PLOF or maximal functional independence.    Personal Factors and Comorbidities Profession;Time since onset of injury/illness/exacerbation;Comorbidity 3+;Past/Current Experience;Fitness    Comorbidities scoliosis, OSA, anxiety, chest pain, COPD, depression, GERD, migraines, neck pain, hyperlipidemia, paroxysmal atrial fibrilation, hypothyroidism,  PTSD, tobacco use    Examination-Activity Limitations Bathing;Carry;Lift;Sit;Stand;Bed Mobility;Locomotion Level;Sleep;Bend;Dressing;Reach Overhead;Squat;Transfers;Hygiene/Grooming;Self Feeding;Stairs    Difficulty with walking, sitting, standing, carrying objects, lifting,  going up and down stairs, getting in/out of the car, performing ADLs such as bathing, showering, getting dressed   Examination-Participation Restrictions Cleaning;Yard Work;Occupation;Community Activity;Laundry;Meal Prep    Stability/Clinical Decision Making Stable/Uncomplicated    Rehab Potential Fair    PT Frequency 2x / week    PT Duration 12 weeks    PT Treatment/Interventions ADLs/Self Care Home Management;Electrical Stimulation;Cryotherapy;Traction;Moist Heat;Gait training;Stair training;Functional mobility training;Balance training;Therapeutic activities;Therapeutic exercise;Neuromuscular re-education;Patient/family education;Manual techniques;Passive range of motion;Dry needling;Spinal Manipulations;Joint Manipulations;Aquatic Therapy    PT Next Visit Plan Plan to progress LE/back strengtheing as tolerated.    PT Home Exercise Plan Medbridge code: [email or web address].  progress as tolerated.    Consulted and Agree with Plan of Care Patient             Patient will benefit from skilled therapeutic intervention in order to improve the following deficits and impairments:  Abnormal gait, Decreased activity tolerance, Decreased endurance, Decreased range of motion, Decreased strength, Hypomobility, Impaired perceived functional ability, Impaired UE functional use, Improper body mechanics, Pain, Obesity, Postural dysfunction, Impaired flexibility, Decreased mobility, Difficulty walking  Visit Diagnosis: Pain in thoracic spine  Acute bilateral low back pain, unspecified whether sciatica present  Difficulty in walking, not elsewhere classified  Muscle weakness (generalized)  Abnormal posture     Problem List Patient Active Problem List   Diagnosis Date Noted   Scoliosis 08/02/2015   Chronic fatigue 02/06/2015   CKD (chronic kidney disease) stage 3, GFR 30-59 ml/min (HCC) 02/06/2015   Nasal polyp, posterior 02/03/2015    Muscle spasms of head and/or neck 11/11/2014   Neck pain of over 3 months duration 10/21/2014   Somatic dysfunction of head region 10/21/2014   GERD (gastroesophageal reflux disease)    Hypothyroidism    Migraines    Depression, major, recurrent, moderate (HCC) 07/20/2014    Class: Chronic   OSA (obstructive sleep apnea) 02/15/2014   Atrial fibrillation (HCC) 08/29/2012   Dyspnea on exertion 08/29/2012   DEGENERATIVE JOINT DISEASE 02/25/2008   Hyperlipidemia 01/29/2007   COPD (chronic obstructive pulmonary disease) (HCC) 01/29/2007   Anxiety state 01/28/2007   OVARIAN CYST, LEFT 05/14/2006    Luretha Murphy. Ilsa Iha, PT, DPT 01/10/21, 3:25 PM  [facility] PHYSICAL AND SPORTS MEDICINE [address] [place], [address] Phone: [phone]   Fax:  [phone]  Name: [name] MRN: [id number] Date of Birth: 10/25/1955

## 2021-01-12 ENCOUNTER — Encounter: Payer: 59 | Admitting: Physical Therapy

## 2021-01-17 ENCOUNTER — Encounter: Payer: Self-pay | Admitting: Physical Therapy

## 2021-01-17 ENCOUNTER — Ambulatory Visit: Payer: Medicare Other | Admitting: Physical Therapy

## 2021-01-17 DIAGNOSIS — M546 Pain in thoracic spine: Secondary | ICD-10-CM | POA: Diagnosis not present

## 2021-01-17 DIAGNOSIS — M6281 Muscle weakness (generalized): Secondary | ICD-10-CM

## 2021-01-17 DIAGNOSIS — M545 Low back pain, unspecified: Secondary | ICD-10-CM

## 2021-01-17 DIAGNOSIS — R262 Difficulty in walking, not elsewhere classified: Secondary | ICD-10-CM

## 2021-01-17 DIAGNOSIS — R293 Abnormal posture: Secondary | ICD-10-CM

## 2021-01-17 NOTE — Therapy (Signed)
[place] [facility] [address] [place], [address] Phone: [phone]   Fax:  [phone]  Physical Therapy Treatment  Patient Details  Name: [name] MRN: [id number] Date of Birth: [birth year] Referring Provider (PT): Olevia Perches, DO.   Encounter Date: 01/17/2021   PT End of Session - 01/17/21 1633     Visit Number 5    Number of Visits 24    Date for PT Re-Evaluation 03/22/21    Authorization Type [employer] (Reporting Period From 12/27/2020)    Progress Note Due on Visit 10    PT Start Time 1607    PT Stop Time 1645    PT Time Calculation (min) 38 min    Activity Tolerance Patient tolerated treatment well;Patient limited by pain    Behavior During Therapy [facility] for tasks assessed/performed             Past Medical History:  Diagnosis Date   Anxiety state, unspecified    Back pain    Chest pain    a. 2008 Cath: reportedly nl;  b. 09/2012 Lexi MV: EF 75%, soft tissue attenuation->Low risk.   COPD (chronic obstructive pulmonary disease) (HCC)    CTS (carpal tunnel syndrome)    Depression    GERD (gastroesophageal reflux disease)    Migraines    Neck pain    Nontoxic multinodular goiter    Other and unspecified hyperlipidemia    Other and unspecified ovarian cyst    left   Other chest pain    PAF (paroxysmal atrial fibrillation) (HCC)    a. 07/2007 Echo: EF 65%, mildly dil LA;  b. currently on propafenone;  c. CHA2DS2VASc = 1 (gender)-->No anticoagulation.   PTSD (post-traumatic stress disorder)    Tobacco use    Tobacco use disorder    Unspecified hypothyroidism    Unspecified sleep apnea    resolved s/p UPPP surgery   Vitamin B12 deficiency     Past Surgical History:  Procedure Laterality Date   bladder tac     CARDIAC CATHETERIZATION  08/12/06   neg. Dr. Welton Flakes, cardio   CHOLECYSTECTOMY     FOOT SURGERY     bone removal from her foot   MRI lumber spine     03/12/06-abn   MRI  pelvis  05/02/06   abn-[place]o, neurosurg   NASAL POLYP SURGERY     nasal ?   PARATHYROIDECTOMY     stress cardiolite  10/06/96   nl, EF 62%   stress myoview  10/25/03   Dr. Park Breed, nl   THYROIDECTOMY  5/92   massive goiter   TONSILLECTOMY     TUBAL LIGATION  1988   UVULOPALATOPHARYNGOPLASTY     and tonsilectomy. [name] 10/04    There were no vitals filed for this visit.   Subjective Assessment - 01/17/21 1629     Subjective Patient is doing well. She states that her back has "been awesome" since last session. Patient reports 7/10 pain R LB pain currently.    Pertinent History Patient is a 65 year old female who presents to outpatient physical therapy with a referral for acute right sided low back pain without sciatica. This patient's chief complaints consist of thoracic and lumbar back pain, leading to the following functional deficits: difficulty with walking, sitting, standing, carrying objects, lifting, going up and down stairs, getting in/out of the car, performing ADLs such as bathing, showering, getting dressed. Relevant past medical history and comorbidities  include scoliosis, OSA, anxiety, chest pain, COPD, depression, GERD, migraines, neck pain, hyperlipidemia, paroxysmal atrial fibrilation, hypothyroidism,  PTSD, tobacco use, removal of thyroid and parathyroid glands. Patient denies history of dizziness, double vision, cancer, bowel and bladder changes, numbness or tingling in groin area, stroke, seizure, unexplained weight loss, and lung or major heart problems.    Limitations Sitting;Lifting;Walking;Standing;House hold activities   Difficulty with walking, sitting, standing, carrying objects, lifting, going up and down stairs, getting in/out of the car, performing ADLs such as bathing, showering, getting dressed   How long can you walk comfortably? Unable to walk for a few blocks or into a store due to back pain    Diagnostic tests X-Ray (12/14/2020)    Patient Stated Goals  Patient would like to "be more comfortable"    Currently in Pain? Yes    Pain Score 7     Pain Location Back    Pain Orientation Lower    Pain Descriptors / Indicators Burning    Pain Onset More than a month ago    Pain Frequency Constant               TREATMENT:  Manual Therapy: to increase joint mobility, support increased overall ROM, and provide pain relief to joint musculature.  PRONE CPA grade III-IV from L1 - L5. Patient report decrease in concordant pain with joint mobilizations/repeated mobilization. B scapular mobilization and distraction    Therapeutic exercise: to centralize symptoms and improve ROM, strength, muscular endurance, and activity tolerance required for successful completion of functional activities.   Prone press up, 1x10 Sidelying open book, 1x15 performed bilaterally Supine glute bridge with posterior pelvic tilt, 2x15  Seated trunk flexion with ball roll out, 1x15 each direction (front, left, and right).  Sit to stands from 18.5 plinth table holding 6lb weight at chest, 3x15 Standing rows with 15lb cable, 3x15   Pt required multimodal cuing for proper technique and to facilitate improved neuromuscular control, strength, range of motion, and functional ability resulting in improved performance and form.     HOME EXERCISE PLAN: Access Code: [email or web address] URL: [email or web address] Date: 01/02/2021 Prepared by: Norton Blizzard  Exercises   Sidelying Thoracic Rotation with Open Book - 1 x daily - 1 sets - 15 reps Low Trap Setting at Wall - 1 x daily - 3 sets - 10 reps Standing Row with Anchored Resistance - 1 x daily - 4 sets - 10 reps       PT Education - 01/17/21 1632     Education Details exercise purpose and form    Person(s) Educated Patient    Methods Explanation;Demonstration;Tactile cues;Verbal cues    Comprehension Verbalized understanding;Returned demonstration;Tactile cues required;Verbal cues required               PT Short Term Goals - 01/10/21 1524       PT SHORT TERM GOAL #1   Title Patient will be independent with initial home program for self-management of symptoms and to improve strength/mobility and functional independence with ADLs.    Baseline HEP to be provided at 2nd session    Time 3    Period Weeks    Status Achieved    Target Date 01/18/21               PT Long Term Goals - 12/28/20 1348       PT LONG TERM GOAL #1   Title Patient will be independent with long term home program for self-management of symptoms and to  improve strength/mobility and functional independence with ADLs.    Baseline Patient to be provided with initial HEP at next session    Time 12    Period Weeks    Status New   TARGET DATE FOR ALL LONG TERM GOALS:  03/22/2021   Target Date 03/22/21      PT LONG TERM GOAL #2   Title Demonstrate improved FOTO score to 56 or greater to demonstrate improvement in overall condition and self-reported functional ability.    Baseline 40 (12/27/2020)    Time 12    Period Weeks    Status New      PT LONG TERM GOAL #3   Title Reduce pain with functional activities to equal or less than 3/10 to allow patient to complete usual activities including ADLs, IADLs, reacreational activities, social engagements, and usual household work activities with less difficulty.    Baseline W: 10/10.      PT LONG TERM GOAL #4   Title Patient will have full lumbar AROM with no compensations or increase in pain in all planes except intermittent end range discomfort to allow patient to complete valued activities with less difficulty.    Baseline Limited lumbar AROM with reporduction of concordant pain in all planes (12/27/2020)    Time 12    Period Weeks    Status New      PT LONG TERM GOAL #5   Title Patient will improve LE strength to 5/5 for improved general mobility, strength, and function.    Baseline 3/5 strength in bilateral hips - limited by pain    Time 12    Period Weeks     Status New      Additional Long Term Goals   Additional Long Term Goals Yes      PT LONG TERM GOAL #6   Title Patient will be able to ambulate 1500 feet in 6 minutes in order to demonstrate improvements in activity tolerance and community ambulation.    Baseline Patient reports difficulty walking a few blocks - 6 minute walk test to be measured at subsequent session.    Time 12    Period Weeks    Status New                   Plan - 01/17/21 1638     Clinical Impression Statement Patient tolerated treatment well per report of decreased back pain and symptoms post session. Patient also responded well to scapular mobilization demonstrating improvements in scapular and thoracic mobilityand activity tolerance after mobilization. However, patient continues to demonstrate limited scapular motor control and strength which contributes to her upper back pain. Patient will continue to benefit from skilled physical therapy intervention to address impairments and maximize functional ability.    Personal Factors and Comorbidities Profession;Time since onset of injury/illness/exacerbation;Comorbidity 3+;Past/Current Experience;Fitness    Comorbidities scoliosis, OSA, anxiety, chest pain, COPD, depression, GERD, migraines, neck pain, hyperlipidemia, paroxysmal atrial fibrilation, hypothyroidism,  PTSD, tobacco use    Examination-Activity Limitations Bathing;Carry;Lift;Sit;Stand;Bed Mobility;Locomotion Level;Sleep;Bend;Dressing;Reach Overhead;Squat;Transfers;Hygiene/Grooming;Self Feeding;Stairs   Difficulty with walking, sitting, standing, carrying objects, lifting, going up and down stairs, getting in/out of the car, performing ADLs such as bathing, showering, getting dressed   Examination-Participation Restrictions Cleaning;Yard Work;Occupation;Community Activity;Laundry;Meal Prep    Stability/Clinical Decision Making Stable/Uncomplicated    Rehab Potential Fair    PT Frequency 2x / week    PT  Duration 12 weeks    PT Treatment/Interventions ADLs/Self Care Home Management;Electrical Stimulation;Cryotherapy;Traction;Moist Heat;Gait training;Stair training;Functional mobility training;Balance training;Therapeutic  activities;Therapeutic exercise;Neuromuscular re-education;Patient/family education;Manual techniques;Passive range of motion;Dry needling;Spinal Manipulations;Joint Manipulations;Aquatic Therapy    PT Next Visit Plan Plan to progress LE/back strengtheing as tolerated.    PT Home Exercise Plan Medbridge code: [email or web address].  progress as tolerated.    Consulted and Agree with Plan of Care Patient             Patient will benefit from skilled therapeutic intervention in order to improve the following deficits and impairments:  Abnormal gait, Decreased activity tolerance, Decreased endurance, Decreased range of motion, Decreased strength, Hypomobility, Impaired perceived functional ability, Impaired UE functional use, Improper body mechanics, Pain, Obesity, Postural dysfunction, Impaired flexibility, Decreased mobility, Difficulty walking  Visit Diagnosis: Pain in thoracic spine  Acute bilateral low back pain, unspecified whether sciatica present  Difficulty in walking, not elsewhere classified  Muscle weakness (generalized)  Abnormal posture     Problem List Patient Active Problem List   Diagnosis Date Noted   Scoliosis 08/02/2015   Chronic fatigue 02/06/2015   CKD (chronic kidney disease) stage 3, GFR 30-59 ml/min (HCC) 02/06/2015   Nasal polyp, posterior 02/03/2015   Muscle spasms of head and/or neck 11/11/2014   Neck pain of over 3 months duration 10/21/2014   Somatic dysfunction of head region 10/21/2014   GERD (gastroesophageal reflux disease)    Hypothyroidism    Migraines    Depression, major, recurrent, moderate (HCC) 07/20/2014    Class: Chronic   OSA (obstructive sleep apnea) 02/15/2014   Atrial fibrillation (HCC) 08/29/2012   Dyspnea on exertion  08/29/2012   DEGENERATIVE JOINT DISEASE 02/25/2008   Hyperlipidemia 01/29/2007   COPD (chronic obstructive pulmonary disease) (HCC) 01/29/2007   Anxiety state 01/28/2007   OVARIAN CYST, LEFT 05/14/2006   Vanessa Barbara, SPT  Luretha Murphy. Ilsa Iha, PT, DPT 01/17/21, 6:34 PM   [place] [facility] PH[facility] SPOR[facility] MEDICINE [address] [place], [address] Phone: [phone]   Fax:  [phone]  Name: KADI HESSION MRN: [id number] Date of Birth: 07-03-1955

## 2021-01-19 ENCOUNTER — Encounter: Payer: Self-pay | Admitting: Physical Therapy

## 2021-01-19 ENCOUNTER — Ambulatory Visit: Payer: Medicare Other | Admitting: Physical Therapy

## 2021-01-19 DIAGNOSIS — R293 Abnormal posture: Secondary | ICD-10-CM

## 2021-01-19 DIAGNOSIS — M6281 Muscle weakness (generalized): Secondary | ICD-10-CM

## 2021-01-19 DIAGNOSIS — M546 Pain in thoracic spine: Secondary | ICD-10-CM | POA: Diagnosis not present

## 2021-01-19 DIAGNOSIS — R262 Difficulty in walking, not elsewhere classified: Secondary | ICD-10-CM

## 2021-01-19 DIAGNOSIS — M545 Low back pain, unspecified: Secondary | ICD-10-CM

## 2021-01-19 NOTE — Therapy (Signed)
[facility] PHYSICAL AND SPOR[facility] MEDICINE [address] [place], [address] Phone: [phone]   Fax:  [phone]  Physical Therapy Treatment  Patient Details  Name: [name] MRN: [id number] Date of Birth: 12/06/1955 Referring Provider (PT): Olevia Perches, DO.   Encounter Date: 01/19/2021   PT End of Session - 01/19/21 1756     Visit Number 6    Number of Visits 24    Date for PT Re-Evaluation 03/22/21    Authorization Type [employer] (Reporting Period From 12/27/2020)    Authorization Time Period [employer]  VL: 30 PT/OT/Chiro per service yr - 30 remain  plan runs 05/07/20 - 02/03/21    Authorization - Visit Number 6    Authorization - Number of Visits 30    Progress Note Due on Visit 10    PT Start Time 1602    PT Stop Time 1642    PT Time Calculation (min) 40 min    Activity Tolerance Patient tolerated treatment well;Patient limited by pain    Behavior During Therapy [facility] for tasks assessed/performed             Past Medical History:  Diagnosis Date   Anxiety state, unspecified    Back pain    Chest pain    a. 2008 Cath: reportedly nl;  b. 09/2012 Lexi MV: EF 75%, soft tissue attenuation->Low risk.   COPD (chronic obstructive pulmonary disease) (HCC)    CTS (carpal tunnel syndrome)    Depression    GERD (gastroesophageal reflux disease)    Migraines    Neck pain    Nontoxic multinodular goiter    Other and unspecified hyperlipidemia    Other and unspecified ovarian cyst    left   Other chest pain    PAF (paroxysmal atrial fibrillation) (HCC)    a. 07/2007 Echo: EF 65%, mildly dil LA;  b. currently on propafenone;  c. CHA2DS2VASc = 1 (gender)-->No anticoagulation.   PTSD (post-traumatic stress disorder)    Tobacco use    Tobacco use disorder    Unspecified hypothyroidism    Unspecified sleep apnea    resolved s/p UPPP surgery   Vitamin B12 deficiency     Past Surgical History:  Procedure Laterality Date    bladder tac     CARDIAC CATHETERIZATION  08/12/06   neg. Dr. Welton Flakes, cardio   CHOLECYSTECTOMY     FOOT SURGERY     bone removal from her foot   MRI lumber spine     03/12/06-abn   MRI pelvis  05/02/06   abn-[facility], neurosurg   NASAL POLYP SURGERY     nasal ?   PARATHYROIDECTOMY     stress cardiolite  10/06/96   nl, EF 62%   stress myoview  10/25/03   Dr. Park Breed, nl   THYROIDECTOMY  5/92   massive goiter   TONSILLECTOMY     TUBAL LIGATION  1988   UVULOPALATOPHARYNGOPLASTY     and tonsilectomy. [name] 10/04    There were no vitals filed for this visit.   Subjective Assessment - 01/19/21 1755     Subjective Patient is doing well. She reports 6/10 pain in her R low back. She believes her back pain is higher today, because she has been sititng a lot for work. She reports HEP compliance.   states 6/10 pain is higher she has been sitting a lot today at work.    Pertinent History Patient is a 65 year old  female who presents to outpatient physical therapy with a referral for acute right sided low back pain without sciatica. This patient's chief complaints consist of thoracic and lumbar back pain, leading to the following functional deficits: difficulty with walking, sitting, standing, carrying objects, lifting, going up and down stairs, getting in/out of the car, performing ADLs such as bathing, showering, getting dressed. Relevant past medical history and comorbidities include scoliosis, OSA, anxiety, chest pain, COPD, depression, GERD, migraines, neck pain, hyperlipidemia, paroxysmal atrial fibrilation, hypothyroidism,  PTSD, tobacco use, removal of thyroid and parathyroid glands. Patient denies history of dizziness, double vision, cancer, bowel and bladder changes, numbness or tingling in groin area, stroke, seizure, unexplained weight loss, and lung or major heart problems.    Limitations Sitting;Lifting;Walking;Standing;House hold activities   Difficulty with walking, sitting, standing,  carrying objects, lifting, going up and down stairs, getting in/out of the car, performing ADLs such as bathing, showering, getting dressed   How long can you walk comfortably? Unable to walk for a few blocks or into a store due to back pain    Diagnostic tests X-Ray (12/14/2020)    Patient Stated Goals Patient would like to "be more comfortable"    Currently in Pain? Yes    Pain Score 6     Pain Location Back    Pain Orientation Lower;Right    Pain Onset More than a month ago    Pain Frequency Constant              OBJECTIVE:  FOTO SCORE: 44   TREATMENT:  Manual Therapy: to increase joint mobility, support increased overall ROM, and provide pain relief to joint musculature.  PRONE B scapular mobilization and distraction    Therapeutic exercise: to centralize symptoms and improve ROM, strength, muscular endurance, and activity tolerance required for successful completion of functional activities.   Quadriped bird dogs on plinth table. 1x5 with just legs. Patient reported increased R sided LB pain with R leg movement.  Seated trunk flexion with ball roll out, 1x15 each direction (front, left, and right).  serratus activation/thoracic spine extension at wall with Foam Roll 1x15. Sit to stands from 18.5 plinth table holding 7lb weight at chest, 3x15 Seated leg press with cable, 1x15 with 35lbs, 2x20 with 45lbs.    Pt required multimodal cuing for proper technique and to facilitate improved neuromuscular control, strength, range of motion, and functional ability resulting in improved performance and form.      HOME EXERCISE PLAN: Access Code: [email or web address] URL: [email or web address] Date: 01/02/2021 Prepared by: Norton Blizzard  Exercises   Sidelying Thoracic Rotation with Open Book - 1 x daily - 1 sets - 15 reps Low Trap Setting at Wall - 1 x daily - 3 sets - 10 reps Standing Row with Anchored Resistance - 1 x daily - 4 sets - 10 reps       PT Education - 01/19/21  1756     Education Details exercise purpose and form    Person(s) Educated Patient    Methods Explanation;Demonstration;Tactile cues;Verbal cues    Comprehension Verbalized understanding;Returned demonstration;Tactile cues required;Verbal cues required              PT Short Term Goals - 01/10/21 1524       PT SHORT TERM GOAL #1   Title Patient will be independent with initial home program for self-management of symptoms and to improve strength/mobility and functional independence with ADLs.    Baseline HEP to be provided at 2nd session  Time 3    Period Weeks    Status Achieved    Target Date 01/18/21               PT Long Term Goals - 01/19/21 1759       PT LONG TERM GOAL #1   Title Patient will be independent with long term home program for self-management of symptoms and to improve strength/mobility and functional independence with ADLs.    Baseline Patient to be provided with initial HEP at next session    Time 12    Period Weeks    Status New   TARGET DATE FOR ALL LONG TERM GOALS:  03/22/2021     PT LONG TERM GOAL #2   Title Demonstrate improved FOTO score to 56 or greater to demonstrate improvement in overall condition and self-reported functional ability.    Baseline 40 (12/27/2020); 44 (01/19/2021)    Time 12    Period Weeks    Status New      PT LONG TERM GOAL #3   Title Reduce pain with functional activities to equal or less than 3/10 to allow patient to complete usual activities including ADLs, IADLs, reacreational activities, social engagements, and usual household work activities with less difficulty.    Baseline W: 10/10.      PT LONG TERM GOAL #4   Title Patient will have full lumbar AROM with no compensations or increase in pain in all planes except intermittent end range discomfort to allow patient to complete valued activities with less difficulty.    Baseline Limited lumbar AROM with reporduction of concordant pain in all planes (12/27/2020)     Time 12    Period Weeks    Status New      PT LONG TERM GOAL #5   Title Patient will improve LE strength to 5/5 for improved general mobility, strength, and function.    Baseline 3/5 strength in bilateral hips - limited by pain    Time 12    Period Weeks    Status New      PT LONG TERM GOAL #6   Title Patient will be able to ambulate 1500 feet in 6 minutes in order to demonstrate improvements in activity tolerance and community ambulation.    Baseline Patient reports difficulty walking a few blocks - 6 minute walk test to be measured at subsequent session.    Time 12    Period Weeks    Status New                   Plan - 01/19/21 1801     Clinical Impression Statement Patient tolerated treatment well. Patient demonstrated improvement in overhead ROM and activity post scapular mobilization while doing serratus activation at the wall today. Patient responded well to incorporation of leg press exercise demonstrating improvements in strength and activity tolerance. Patient will continue to benefit from skilled PT intervention to address impairments and maximize functional ability.    Personal Factors and Comorbidities Profession;Time since onset of injury/illness/exacerbation;Comorbidity 3+;Past/Current Experience;Fitness    Comorbidities scoliosis, OSA, anxiety, chest pain, COPD, depression, GERD, migraines, neck pain, hyperlipidemia, paroxysmal atrial fibrilation, hypothyroidism,  PTSD, tobacco use    Examination-Activity Limitations Bathing;Carry;Lift;Sit;Stand;Bed Mobility;Locomotion Level;Sleep;Bend;Dressing;Reach Overhead;Squat;Transfers;Hygiene/Grooming;Self Feeding;Stairs   Difficulty with walking, sitting, standing, carrying objects, lifting, going up and down stairs, getting in/out of the car, performing ADLs such as bathing, showering, getting dressed   Examination-Participation Restrictions Cleaning;Yard Work;Occupation;Community Activity;Laundry;Meal Prep     Stability/Clinical Decision Making Stable/Uncomplicated  Rehab Potential Fair    PT Frequency 2x / week    PT Duration 12 weeks    PT Treatment/Interventions ADLs/Self Care Home Management;Electrical Stimulation;Cryotherapy;Traction;Moist Heat;Gait training;Stair training;Functional mobility training;Balance training;Therapeutic activities;Therapeutic exercise;Neuromuscular re-education;Patient/family education;Manual techniques;Passive range of motion;Dry needling;Spinal Manipulations;Joint Manipulations;Aquatic Therapy    PT Next Visit Plan Plan to progress LE/back strengtheing as tolerated.    PT Home Exercise Plan Medbridge code: [email or web address].  progress as tolerated.    Consulted and Agree with Plan of Care Patient             Patient will benefit from skilled therapeutic intervention in order to improve the following deficits and impairments:  Abnormal gait, Decreased activity tolerance, Decreased endurance, Decreased range of motion, Decreased strength, Hypomobility, Impaired perceived functional ability, Impaired UE functional use, Improper body mechanics, Pain, Obesity, Postural dysfunction, Impaired flexibility, Decreased mobility, Difficulty walking  Visit Diagnosis: Pain in thoracic spine  Acute bilateral low back pain, unspecified whether sciatica present  Difficulty in walking, not elsewhere classified  Muscle weakness (generalized)  Abnormal posture     Problem List Patient Active Problem List   Diagnosis Date Noted   Scoliosis 08/02/2015   Chronic fatigue 02/06/2015   CKD (chronic kidney disease) stage 3, GFR 30-59 ml/min (HCC) 02/06/2015   Nasal polyp, posterior 02/03/2015   Muscle spasms of head and/or neck 11/11/2014   Neck pain of over 3 months duration 10/21/2014   Somatic dysfunction of head region 10/21/2014   GERD (gastroesophageal reflux disease)    Hypothyroidism    Migraines    Depression, major, recurrent, moderate (HCC) 07/20/2014    Class:  Chronic   OSA (obstructive sleep apnea) 02/15/2014   Atrial fibrillation (HCC) 08/29/2012   Dyspnea on exertion 08/29/2012   DEGENERATIVE JOINT DISEASE 02/25/2008   Hyperlipidemia 01/29/2007   COPD (chronic obstructive pulmonary disease) (HCC) 01/29/2007   Anxiety state 01/28/2007   OVARIAN CYST, LEFT 05/14/2006   Vanessa Barbara, SPT  Student physical therapist under direct supervision of licensed physical therapists during the entirety of the session.   Luretha Murphy. Ilsa Iha, PT, DPT 01/19/21, 7:16 PM   [facility] PHYSICAL AND SPORTS MEDICINE [address], [place], [address] Phone: [phone]   Fax:  [phone]  Name: [name] MRN: [id number] Date of Birth: 1955/07/17

## 2021-01-24 ENCOUNTER — Ambulatory Visit: Payer: Medicare Other | Admitting: Physical Therapy

## 2021-01-24 ENCOUNTER — Encounter: Payer: Self-pay | Admitting: Physical Therapy

## 2021-01-24 DIAGNOSIS — M6281 Muscle weakness (generalized): Secondary | ICD-10-CM

## 2021-01-24 DIAGNOSIS — M546 Pain in thoracic spine: Secondary | ICD-10-CM | POA: Diagnosis not present

## 2021-01-24 DIAGNOSIS — M545 Low back pain, unspecified: Secondary | ICD-10-CM

## 2021-01-24 DIAGNOSIS — R293 Abnormal posture: Secondary | ICD-10-CM

## 2021-01-24 DIAGNOSIS — R262 Difficulty in walking, not elsewhere classified: Secondary | ICD-10-CM

## 2021-01-24 NOTE — Therapy (Signed)
[facility] PHYSICAL AND SP[facility] MEDICINE [address] [place], [address] Phone: [phone]   Fax:  [phone]  Physical Therapy Treatment  Patient Details  Name: [name] MRN: [id number] Date of Birth: May 26, 1955 Referring Provider (PT): Olevia Perches, DO.   Encounter Date: 01/24/2021   PT End of Session - 01/25/21 0913     Visit Number 7    Number of Visits 24    Date for PT Re-Evaluation 03/22/21    Authorization Type [employer] (Reporting Period From 12/27/2020)    Authorization Time Period [employer]  VL: 30 PT/OT/Chiro per service yr - 30 remain  plan runs 05/07/20 - 02/03/21    Authorization - Visit Number 7    Authorization - Number of Visits 30    Progress Note Due on Visit 10    PT Start Time 1600    PT Stop Time 1640    PT Time Calculation (min) 40 min    Activity Tolerance Patient tolerated treatment well;Patient limited by pain    Behavior During Therapy [facility] for tasks assessed/performed             Past Medical History:  Diagnosis Date   Anxiety state, unspecified    Back pain    Chest pain    a. 2008 Cath: reportedly nl;  b. 09/2012 Lexi MV: EF 75%, soft tissue attenuation->Low risk.   COPD (chronic obstructive pulmonary disease) (HCC)    CTS (carpal tunnel syndrome)    Depression    GERD (gastroesophageal reflux disease)    Migraines    Neck pain    Nontoxic multinodular goiter    Other and unspecified hyperlipidemia    Other and unspecified ovarian cyst    left   Other chest pain    PAF (paroxysmal atrial fibrillation) (HCC)    a. 07/2007 Echo: EF 65%, mildly dil LA;  b. currently on propafenone;  c. CHA2DS2VASc = 1 (gender)-->No anticoagulation.   PTSD (post-traumatic stress disorder)    Tobacco use    Tobacco use disorder    Unspecified hypothyroidism    Unspecified sleep apnea    resolved s/p UPPP surgery   Vitamin B12 deficiency     Past Surgical History:  Procedure Laterality Date    bladder tac     CARDIAC CATHETERIZATION  08/12/06   neg. Dr. Welton Flakes, cardio   CHOLECYSTECTOMY     FOOT SURGERY     bone removal from her foot   MRI lumber spine     03/12/06-abn   MRI pelvis  05/02/06   abn-[facility]o, neurosurg   NASAL POLYP SURGERY     nasal ?   PARATHYROIDECTOMY     stress cardiolite  10/06/96   nl, EF 62%   stress myoview  10/25/03   Dr. Park Breed, nl   THYROIDECTOMY  5/92   massive goiter   TONSILLECTOMY     TUBAL LIGATION  1988   UVULOPALATOPHARYNGOPLASTY     and tonsilectomy. [name] 10/04    There were no vitals filed for this visit.   Subjective Assessment - 01/24/21 1627     Subjective Patient reports increased pain since Sunday afternoon. Patient believes she slept in the wrong position this past weekend and that this provoked her pain. Patient right low back pain is currently an  8/10. She states that she felt good after last PT session and until Sunday. Patient states that she did not try to manage increased pain with HEP.  Pertinent History Patient is a 65 year old female who presents to outpatient physical therapy with a referral for acute right sided low back pain without sciatica. This patient's chief complaints consist of thoracic and lumbar back pain, leading to the following functional deficits: difficulty with walking, sitting, standing, carrying objects, lifting, going up and down stairs, getting in/out of the car, performing ADLs such as bathing, showering, getting dressed. Relevant past medical history and comorbidities include scoliosis, OSA, anxiety, chest pain, COPD, depression, GERD, migraines, neck pain, hyperlipidemia, paroxysmal atrial fibrilation, hypothyroidism,  PTSD, tobacco use, removal of thyroid and parathyroid glands. Patient denies history of dizziness, double vision, cancer, bowel and bladder changes, numbness or tingling in groin area, stroke, seizure, unexplained weight loss, and lung or major heart problems.    Limitations  Sitting;Lifting;Walking;Standing;House hold activities   Difficulty with walking, sitting, standing, carrying objects, lifting, going up and down stairs, getting in/out of the car, performing ADLs such as bathing, showering, getting dressed   How long can you walk comfortably? Unable to walk for a few blocks or into a store due to back pain    Diagnostic tests X-Ray (12/14/2020)    Patient Stated Goals Patient would like to "be more comfortable"    Currently in Pain? Yes    Pain Score 8     Pain Location Back    Pain Orientation Right;Lower    Pain Onset More than a month ago    Pain Frequency Constant               TREATMENT:  Manual Therapy: to increase joint mobility, support increased overall ROM, and provide pain relief to joint musculature.  PRONE STM including trigger point release to right low back musculature (spinal extensors and quadratus lumborum)    Therapeutic exercise: to centralize symptoms and improve ROM, strength, muscular endurance, and activity tolerance required for successful completion of functional activities.   Sidelying thoracic rotation with open books, 1x15 each side.  Seated trunk flexion with ball roll out, 1x15 each direction (front, left, and right).  Seated leg press with cable, 4x20 with 45lbs.     Pt required multimodal cuing for proper technique and to facilitate improved neuromuscular control, strength, range of motion, and functional ability resulting in improved performance and form.      HOME EXERCISE PLAN: Access Code: [email or web address] URL: [email or web address] Date: 01/02/2021 Prepared by: Norton Blizzard  Exercises   Sidelying Thoracic Rotation with Open Book - 1 x daily - 1 sets - 15 reps Low Trap Setting at Wall - 1 x daily - 3 sets - 10 reps Standing Row with Anchored Resistance - 1 x daily - 4 sets - 10 reps           PT Education - 01/25/21 0913     Education Details exercise purpose and form    Person(s)  Educated Patient    Methods Explanation;Demonstration;Tactile cues;Verbal cues    Comprehension Verbalized understanding;Returned demonstration;Tactile cues required;Verbal cues required              PT Short Term Goals - 01/10/21 1524       PT SHORT TERM GOAL #1   Title Patient will be independent with initial home program for self-management of symptoms and to improve strength/mobility and functional independence with ADLs.    Baseline HEP to be provided at 2nd session    Time 3    Period Weeks    Status Achieved    Target Date 01/18/21  PT Long Term Goals - 01/19/21 1759       PT LONG TERM GOAL #1   Title Patient will be independent with long term home program for self-management of symptoms and to improve strength/mobility and functional independence with ADLs.    Baseline Patient to be provided with initial HEP at next session    Time 12    Period Weeks    Status New   TARGET DATE FOR ALL LONG TERM GOALS:  03/22/2021     PT LONG TERM GOAL #2   Title Demonstrate improved FOTO score to 56 or greater to demonstrate improvement in overall condition and self-reported functional ability.    Baseline 40 (12/27/2020); 44 (01/19/2021)    Time 12    Period Weeks    Status New      PT LONG TERM GOAL #3   Title Reduce pain with functional activities to equal or less than 3/10 to allow patient to complete usual activities including ADLs, IADLs, reacreational activities, social engagements, and usual household work activities with less difficulty.    Baseline W: 10/10.      PT LONG TERM GOAL #4   Title Patient will have full lumbar AROM with no compensations or increase in pain in all planes except intermittent end range discomfort to allow patient to complete valued activities with less difficulty.    Baseline Limited lumbar AROM with reporduction of concordant pain in all planes (12/27/2020)    Time 12    Period Weeks    Status New      PT LONG TERM GOAL #5    Title Patient will improve LE strength to 5/5 for improved general mobility, strength, and function.    Baseline 3/5 strength in bilateral hips - limited by pain    Time 12    Period Weeks    Status New      PT LONG TERM GOAL #6   Title Patient will be able to ambulate 1500 feet in 6 minutes in order to demonstrate improvements in activity tolerance and community ambulation.    Baseline Patient reports difficulty walking a few blocks - 6 minute walk test to be measured at subsequent session.    Time 12    Period Weeks    Status New                   Plan - 01/25/21 0916     Clinical Impression Statement Patient tolerated treatment well. Session focused on decreasing patient's pain and LB tension after recent flare up of patient LB pain over the weekend. Patient responded well to manual work and gentle thoracic and lumbar mobility with report of decreased LB pain post session. However, patient pain prevented progression and incorporation of LB strengthening exercises. Patient will continue to benefit from skilled PT intervention to address impairments and maximize functional ability.    Personal Factors and Comorbidities Profession;Time since onset of injury/illness/exacerbation;Comorbidity 3+;Past/Current Experience;Fitness    Comorbidities scoliosis, OSA, anxiety, chest pain, COPD, depression, GERD, migraines, neck pain, hyperlipidemia, paroxysmal atrial fibrilation, hypothyroidism,  PTSD, tobacco use    Examination-Activity Limitations Bathing;Carry;Lift;Sit;Stand;Bed Mobility;Locomotion Level;Sleep;Bend;Dressing;Reach Overhead;Squat;Transfers;Hygiene/Grooming;Self Feeding;Stairs   Difficulty with walking, sitting, standing, carrying objects, lifting, going up and down stairs, getting in/out of the car, performing ADLs such as bathing, showering, getting dressed   Examination-Participation Restrictions Cleaning;Yard Work;Occupation;Community Activity;Laundry;Meal Prep     Stability/Clinical Decision Making Stable/Uncomplicated    Rehab Potential Fair    PT Frequency 2x / week  PT Duration 12 weeks    PT Treatment/Interventions ADLs/Self Care Home Management;Electrical Stimulation;Cryotherapy;Traction;Moist Heat;Gait training;Stair training;Functional mobility training;Balance training;Therapeutic activities;Therapeutic exercise;Neuromuscular re-education;Patient/family education;Manual techniques;Passive range of motion;Dry needling;Spinal Manipulations;Joint Manipulations;Aquatic Therapy    PT Next Visit Plan Plan to progress LE/back strengtheing as tolerated.    PT Home Exercise Plan Medbridge code: [email or web address].  progress as tolerated.    Consulted and Agree with Plan of Care Patient             Patient will benefit from skilled therapeutic intervention in order to improve the following deficits and impairments:  Abnormal gait, Decreased activity tolerance, Decreased endurance, Decreased range of motion, Decreased strength, Hypomobility, Impaired perceived functional ability, Impaired UE functional use, Improper body mechanics, Pain, Obesity, Postural dysfunction, Impaired flexibility, Decreased mobility, Difficulty walking  Visit Diagnosis: Pain in thoracic spine  Acute bilateral low back pain, unspecified whether sciatica present  Difficulty in walking, not elsewhere classified  Muscle weakness (generalized)  Abnormal posture     Problem List Patient Active Problem List   Diagnosis Date Noted   Scoliosis 08/02/2015   Chronic fatigue 02/06/2015   CKD (chronic kidney disease) stage 3, GFR 30-59 ml/min (HCC) 02/06/2015   Nasal polyp, posterior 02/03/2015   Muscle spasms of head and/or neck 11/11/2014   Neck pain of over 3 months duration 10/21/2014   Somatic dysfunction of head region 10/21/2014   GERD (gastroesophageal reflux disease)    Hypothyroidism    Migraines    Depression, major, recurrent, moderate (HCC) 07/20/2014    Class:  Chronic   OSA (obstructive sleep apnea) 02/15/2014   Atrial fibrillation (HCC) 08/29/2012   Dyspnea on exertion 08/29/2012   DEGENERATIVE JOINT DISEASE 02/25/2008   Hyperlipidemia 01/29/2007   COPD (chronic obstructive pulmonary disease) (HCC) 01/29/2007   Anxiety state 01/28/2007   OVARIAN CYST, LEFT 05/14/2006    [name], SPT  Student physical therapist under direct supervision of licensed physical therapists during the entirety of the session.   [name]. [name], PT, DPT 01/25/21, 10:40 AM  [place] [facility] PHYSICAL AND SPORTS MEDICINE [address] [place], [address] Phone: [phone]   Fax:  [phone]  Name: [name] MRN: [id number] Date of Birth: [birth year]

## 2021-01-25 ENCOUNTER — Encounter: Payer: Self-pay | Admitting: Physical Therapy

## 2021-01-26 ENCOUNTER — Encounter: Payer: Self-pay | Admitting: Physical Therapy

## 2021-01-26 ENCOUNTER — Ambulatory Visit: Payer: Medicare Other | Admitting: Physical Therapy

## 2021-01-26 DIAGNOSIS — M546 Pain in thoracic spine: Secondary | ICD-10-CM | POA: Diagnosis not present

## 2021-01-26 DIAGNOSIS — R262 Difficulty in walking, not elsewhere classified: Secondary | ICD-10-CM

## 2021-01-26 DIAGNOSIS — R293 Abnormal posture: Secondary | ICD-10-CM

## 2021-01-26 DIAGNOSIS — M545 Low back pain, unspecified: Secondary | ICD-10-CM

## 2021-01-26 DIAGNOSIS — M6281 Muscle weakness (generalized): Secondary | ICD-10-CM

## 2021-01-26 NOTE — Therapy (Signed)
[facility] PHYSICAL AND SPORTS MEDICINE [address] [place], [address] Phone: [phone]   Fax:  [phone]  Physical Therapy Treatment  Patient Details  Name: [name] MRN: [id number] Date of Birth: [birth year] Referring Provider (PT): Olevia Perches, DO.   Encounter Date: 01/26/2021   PT End of Session - 01/26/21 1744     Visit Number 8    Number of Visits 24    Date for PT Re-Evaluation 03/22/21    Authorization Type [employer] (Reporting Period From 12/27/2020)    Authorization Time Period [employer]  VL: 30 PT/OT/Chiro per service yr - 30 remain  plan runs 05/07/20 - 02/03/21    Authorization - Visit Number 8    Authorization - Number of Visits 30    Progress Note Due on Visit 10    PT Start Time 1600    PT Stop Time 1644    PT Time Calculation (min) 44 min    Activity Tolerance Patient tolerated treatment well;Patient limited by pain    Behavior During Therapy [facility] for tasks assessed/performed             Past Medical History:  Diagnosis Date   Anxiety state, unspecified    Back pain    Chest pain    a. 2008 Cath: reportedly nl;  b. 09/2012 Lexi MV: EF 75%, soft tissue attenuation->Low risk.   COPD (chronic obstructive pulmonary disease) (HCC)    CTS (carpal tunnel syndrome)    Depression    GERD (gastroesophageal reflux disease)    Migraines    Neck pain    Nontoxic multinodular goiter    Other and unspecified hyperlipidemia    Other and unspecified ovarian cyst    left   Other chest pain    PAF (paroxysmal atrial fibrillation) (HCC)    a. 07/2007 Echo: EF 65%, mildly dil LA;  b. currently on propafenone;  c. CHA2DS2VASc = 1 (gender)-->No anticoagulation.   PTSD (post-traumatic stress disorder)    Tobacco use    Tobacco use disorder    Unspecified hypothyroidism    Unspecified sleep apnea    resolved s/p UPPP surgery   Vitamin B12 deficiency     Past Surgical History:  Procedure Laterality Date    bladder tac     CARDIAC CATHETERIZATION  08/12/06   neg. Dr. Welton Flakes, cardio   CHOLECYSTECTOMY     FOOT SURGERY     bone removal from her foot   MRI lumber spine     03/12/06-abn   MRI pelvis  05/02/06   abn-[facility], neurosurg   NASAL POLYP SURGERY     nasal ?   PARATHYROIDECTOMY     stress cardiolite  10/06/96   nl, EF 62%   stress myoview  10/25/03   Dr. Park Breed, nl   THYROIDECTOMY  5/92   massive goiter   TONSILLECTOMY     TUBAL LIGATION  1988   UVULOPALATOPHARYNGOPLASTY     and tonsilectomy. [name] 10/04    There were no vitals filed for this visit.   Subjective Assessment - 01/26/21 1741     Subjective Patient states that R sided low back pain (that can spread to the left side of her back/side) has continued to be aggravated. Patient reports she is in a "terrible amount of pain" currently. Patient reports pain at 8/10 at beginning of session. Patient tried to perform HEP at home since last PT session to manage pain. However, HEP did not  help reduce patient pain/symptoms per patient report.    Pertinent History Patient is a 65 year old female who presents to outpatient physical therapy with a referral for acute right sided low back pain without sciatica. This patient's chief complaints consist of thoracic and lumbar back pain, leading to the following functional deficits: difficulty with walking, sitting, standing, carrying objects, lifting, going up and down stairs, getting in/out of the car, performing ADLs such as bathing, showering, getting dressed. Relevant past medical history and comorbidities include scoliosis, OSA, anxiety, chest pain, COPD, depression, GERD, migraines, neck pain, hyperlipidemia, paroxysmal atrial fibrilation, hypothyroidism,  PTSD, tobacco use, removal of thyroid and parathyroid glands. Patient denies history of dizziness, double vision, cancer, bowel and bladder changes, numbness or tingling in groin area, stroke, seizure, unexplained weight loss, and lung  or major heart problems.    Limitations Sitting;Lifting;Walking;Standing;House hold activities   Difficulty with walking, sitting, standing, carrying objects, lifting, going up and down stairs, getting in/out of the car, performing ADLs such as bathing, showering, getting dressed   How long can you walk comfortably? Unable to walk for a few blocks or into a store due to back pain    Diagnostic tests X-Ray (12/14/2020)    Patient Stated Goals Patient would like to "be more comfortable"    Currently in Pain? Yes    Pain Score 8     Pain Location Back    Pain Onset More than a month ago             TREATMENT:  Manual Therapy: to increase joint mobility, support increased overall ROM, and provide pain relief to joint musculature.   Prone CPA joint mobilizations from L1-L5. Grade II-IV. 10-15 repetitions at each joint. Decreased pain reported after repeated L3-L5 joint mobilizations.  Hooklying R hip distraction 4x30 seconds. Relieved patient symptoms.   Therapeutic exercise: to centralize symptoms and improve ROM, strength, muscular endurance, and activity tolerance required for successful completion of functional activities.   - Prone holds on elbows with 15 second hold, 1x5 - Prone press ups on plinth table, 1x10 (Manual work - CPA's)  - Prone press ups on plinth table, 1x10. Decreased pain noted after press ups. Cues to breathe deeply to increase LB stretch.  - serratus activation/thoracic spine extension at wall with Foam Roll 1x15. - standing pallof press with green latex free theraband, 1x3 repetitions. Discontinued due to patient pain.  (Manual work - hip distraction)    Pt required multimodal cuing for proper technique and to facilitate improved neuromuscular control, strength, range of motion, and functional ability resulting in improved performance and form.      HOME EXERCISE PLAN: Access Code: [email or web address] URL: [email or web address] Date: 01/02/2021 Prepared by:  Norton Blizzard  Exercises   Sidelying Thoracic Rotation with Open Book - 1 x daily - 1 sets - 15 reps Low Trap Setting at Wall - 1 x daily - 3 sets - 10 reps Standing Row with Anchored Resistance - 1 x daily - 4 sets - 10 reps       PT Education - 01/26/21 1744     Education Details exercise purpose and form.    Person(s) Educated Patient    Methods Explanation;Demonstration;Tactile cues;Verbal cues    Comprehension Returned demonstration;Tactile cues required;Verbalized understanding;Verbal cues required              PT Short Term Goals - 01/10/21 1524       PT SHORT TERM GOAL #1   Title Patient will be  independent with initial home program for self-management of symptoms and to improve strength/mobility and functional independence with ADLs.    Baseline HEP to be provided at 2nd session    Time 3    Period Weeks    Status Achieved    Target Date 01/18/21               PT Long Term Goals - 01/19/21 1759       PT LONG TERM GOAL #1   Title Patient will be independent with long term home program for self-management of symptoms and to improve strength/mobility and functional independence with ADLs.    Baseline Patient to be provided with initial HEP at next session    Time 12    Period Weeks    Status New   TARGET DATE FOR ALL LONG TERM GOALS:  03/22/2021     PT LONG TERM GOAL #2   Title Demonstrate improved FOTO score to 56 or greater to demonstrate improvement in overall condition and self-reported functional ability.    Baseline 40 (12/27/2020); 44 (01/19/2021)    Time 12    Period Weeks    Status New      PT LONG TERM GOAL #3   Title Reduce pain with functional activities to equal or less than 3/10 to allow patient to complete usual activities including ADLs, IADLs, reacreational activities, social engagements, and usual household work activities with less difficulty.    Baseline W: 10/10.      PT LONG TERM GOAL #4   Title Patient will have full lumbar  AROM with no compensations or increase in pain in all planes except intermittent end range discomfort to allow patient to complete valued activities with less difficulty.    Baseline Limited lumbar AROM with reporduction of concordant pain in all planes (12/27/2020)    Time 12    Period Weeks    Status New      PT LONG TERM GOAL #5   Title Patient will improve LE strength to 5/5 for improved general mobility, strength, and function.    Baseline 3/5 strength in bilateral hips - limited by pain    Time 12    Period Weeks    Status New      PT LONG TERM GOAL #6   Title Patient will be able to ambulate 1500 feet in 6 minutes in order to demonstrate improvements in activity tolerance and community ambulation.    Baseline Patient reports difficulty walking a few blocks - 6 minute walk test to be measured at subsequent session.    Time 12    Period Weeks    Status New                   Plan - 01/26/21 1751     Clinical Impression Statement Patient tolerated treatment well. Session focused on decreasing patient pain. Patient right LB pain responded well to repeat prone extensions, manual work, and R hip distraction per patient report of decreased pain by end of session. However, patient continues to demonstrate poor activity tolerance and LB/core/LE strength which influences patient LB and functional ability. Patient will continue to benefit from skilled physical therapy intervention to address impairments and maximize functional ability.    Personal Factors and Comorbidities Profession;Time since onset of injury/illness/exacerbation;Comorbidity 3+;Past/Current Experience;Fitness    Comorbidities scoliosis, OSA, anxiety, chest pain, COPD, depression, GERD, migraines, neck pain, hyperlipidemia, paroxysmal atrial fibrilation, hypothyroidism,  PTSD, tobacco use    Examination-Activity Limitations Bathing;Carry;Lift;Sit;Stand;Bed Mobility;Locomotion  Level;Sleep;Bend;Dressing;Reach  Overhead;Squat;Transfers;Hygiene/Grooming;Self Feeding;Stairs   Difficulty with walking, sitting, standing, carrying objects, lifting, going up and down stairs, getting in/out of the car, performing ADLs such as bathing, showering, getting dressed   Examination-Participation Restrictions Cleaning;Yard Work;Occupation;Community Activity;Laundry;Meal Prep    Stability/Clinical Decision Making Stable/Uncomplicated    Rehab Potential Fair    PT Frequency 2x / week    PT Duration 12 weeks    PT Treatment/Interventions ADLs/Self Care Home Management;Electrical Stimulation;Cryotherapy;Traction;Moist Heat;Gait training;Stair training;Functional mobility training;Balance training;Therapeutic activities;Therapeutic exercise;Neuromuscular re-education;Patient/family education;Manual techniques;Passive range of motion;Dry needling;Spinal Manipulations;Joint Manipulations;Aquatic Therapy    PT Next Visit Plan Plan to progress LE/back strengtheing as tolerated.    PT Home Exercise Plan Medbridge code: [email or web address].  progress as tolerated.    Consulted and Agree with Plan of Care Patient             Patient will benefit from skilled therapeutic intervention in order to improve the following deficits and impairments:  Abnormal gait, Decreased activity tolerance, Decreased endurance, Decreased range of motion, Decreased strength, Hypomobility, Impaired perceived functional ability, Impaired UE functional use, Improper body mechanics, Pain, Obesity, Postural dysfunction, Impaired flexibility, Decreased mobility, Difficulty walking  Visit Diagnosis: Pain in thoracic spine  Acute bilateral low back pain, unspecified whether sciatica present  Difficulty in walking, not elsewhere classified  Muscle weakness (generalized)  Abnormal posture     Problem List Patient Active Problem List   Diagnosis Date Noted   Scoliosis 08/02/2015   Chronic fatigue 02/06/2015   CKD (chronic kidney disease) stage 3, GFR  30-59 ml/min (HCC) 02/06/2015   Nasal polyp, posterior 02/03/2015   Muscle spasms of head and/or neck 11/11/2014   Neck pain of over 3 months duration 10/21/2014   Somatic dysfunction of head region 10/21/2014   GERD (gastroesophageal reflux disease)    Hypothyroidism    Migraines    Depression, major, recurrent, moderate (HCC) 07/20/2014    Class: Chronic   OSA (obstructive sleep apnea) 02/15/2014   Atrial fibrillation (HCC) 08/29/2012   Dyspnea on exertion 08/29/2012   DEGENERATIVE JOINT DISEASE 02/25/2008   Hyperlipidemia 01/29/2007   COPD (chronic obstructive pulmonary disease) (HCC) 01/29/2007   Anxiety state 01/28/2007   OVARIAN CYST, LEFT 05/14/2006    [name], SPT  Student physical therapist under direct supervision of licensed physical therapists during the entirety of the session.   [name]. Ilsa Iha, PT, DPT 01/26/21, 6:16 PM   [facility] PHYSICAL AND SPORTS MEDICINE [address] [place], [address] Phone: [phone]   Fax:  [phone]  Name: [name] MRN: [id number] Date of Birth: 04/10/1956

## 2021-01-30 ENCOUNTER — Other Ambulatory Visit: Payer: Self-pay

## 2021-01-30 ENCOUNTER — Ambulatory Visit (INDEPENDENT_AMBULATORY_CARE_PROVIDER_SITE_OTHER): Payer: 59 | Admitting: Family Medicine

## 2021-01-30 ENCOUNTER — Encounter: Payer: Self-pay | Admitting: Family Medicine

## 2021-01-30 VITALS — BP 115/75 | HR 74 | Ht 63.0 in | Wt 210.0 lb

## 2021-01-30 DIAGNOSIS — Z1211 Encounter for screening for malignant neoplasm of colon: Secondary | ICD-10-CM

## 2021-01-30 DIAGNOSIS — Z23 Encounter for immunization: Secondary | ICD-10-CM

## 2021-01-30 DIAGNOSIS — K219 Gastro-esophageal reflux disease without esophagitis: Secondary | ICD-10-CM

## 2021-01-30 DIAGNOSIS — Z0000 Encounter for general adult medical examination without abnormal findings: Secondary | ICD-10-CM | POA: Diagnosis not present

## 2021-01-30 DIAGNOSIS — E039 Hypothyroidism, unspecified: Secondary | ICD-10-CM

## 2021-01-30 DIAGNOSIS — Z13820 Encounter for screening for osteoporosis: Secondary | ICD-10-CM | POA: Diagnosis not present

## 2021-01-30 DIAGNOSIS — Z131 Encounter for screening for diabetes mellitus: Secondary | ICD-10-CM

## 2021-01-30 DIAGNOSIS — Z1231 Encounter for screening mammogram for malignant neoplasm of breast: Secondary | ICD-10-CM | POA: Diagnosis not present

## 2021-01-30 DIAGNOSIS — E782 Mixed hyperlipidemia: Secondary | ICD-10-CM

## 2021-01-30 DIAGNOSIS — Z136 Encounter for screening for cardiovascular disorders: Secondary | ICD-10-CM

## 2021-01-30 DIAGNOSIS — N1831 Chronic kidney disease, stage 3a: Secondary | ICD-10-CM

## 2021-01-30 DIAGNOSIS — F331 Major depressive disorder, recurrent, moderate: Secondary | ICD-10-CM

## 2021-01-30 DIAGNOSIS — Z7189 Other specified counseling: Secondary | ICD-10-CM

## 2021-01-30 DIAGNOSIS — J449 Chronic obstructive pulmonary disease, unspecified: Secondary | ICD-10-CM

## 2021-01-30 LAB — MICROSCOPIC EXAMINATION: RBC, Urine: NONE SEEN /hpf (ref 0–2)

## 2021-01-30 LAB — URINALYSIS, ROUTINE W REFLEX MICROSCOPIC
Bilirubin, UA: NEGATIVE
Glucose, UA: NEGATIVE
Ketones, UA: NEGATIVE
Nitrite, UA: NEGATIVE
Protein,UA: NEGATIVE
RBC, UA: NEGATIVE
Specific Gravity, UA: 1.02 (ref 1.005–1.030)
Urobilinogen, Ur: 0.2 mg/dL (ref 0.2–1.0)
pH, UA: 5.5 (ref 5.0–7.5)

## 2021-01-30 LAB — MICROALBUMIN, URINE WAIVED
Creatinine, Urine Waived: 200 mg/dL (ref 10–300)
Microalb, Ur Waived: 30 mg/L — ABNORMAL HIGH (ref 0–19)
Microalb/Creat Ratio: <30 mg/g (ref <30)

## 2021-01-30 LAB — BAYER DCA HB A1C WAIVED: HB A1C (BAYER DCA - WAIVED): 5.5 % (ref 4.8–5.6)

## 2021-01-30 MED ORDER — ATORVASTATIN CALCIUM 40 MG PO TABS: 40.0000 mg | ORAL_TABLET | Freq: Every day | ORAL | 1 refills | Status: DC

## 2021-01-30 MED ORDER — CITALOPRAM HYDROBROMIDE 40 MG PO TABS: 40.0000 mg | ORAL_TABLET | Freq: Every day | ORAL | 1 refills | Status: DC

## 2021-01-30 NOTE — Assessment & Plan Note (Signed)
Under good control on current regimen. Continue current regimen. Continue to monitor. Call with any concerns. Refills given. Labs drawn today.   

## 2021-01-30 NOTE — Assessment & Plan Note (Signed)
Under good control on current regimen. Continue current regimen. Continue to monitor. Call with any concerns. Refills given.   

## 2021-01-30 NOTE — Progress Notes (Signed)
BP 115/75   Pulse 74   Ht 5\' 3"  (1.6 m)   Wt 210 lb (95.3 kg)   BMI 37.20 kg/m    Subjective:    Patient ID: , female    DOB: 08-15-1955, 65 y.o.   MRN: [id number]  HPI: [name] is a 65 y.o. female presenting on 01/30/2021 for comprehensive medical examination. Current medical complaints include:  HYPOTHYROIDISM Thyroid control status: does not feel controlled Satisfied with current treatment? no Medication side effects: no Medication compliance: excellent compliance Recent dose adjustment:no Fatigue: yes Cold intolerance: no Heat intolerance: no Weight gain: no Weight loss: no Constipation: no Diarrhea/loose stools: no Palpitations: no Lower extremity edema: no Anxiety/depressed mood: no Dry skin and hair  HYPERLIPIDEMIA Hyperlipidemia status: excellent compliance Satisfied with current treatment?  yes Side effects:  no Medication compliance: excellent compliance Past cholesterol meds: atorvastatin Supplements: none Aspirin:  no The 10-year ASCVD risk score (Arnett DK, et al., 2019) is: 5.9%   Values used to calculate the score:     Age: 25 years     Sex: Female     Is Non-Hispanic African American: No     Diabetic: No     Tobacco smoker: No     Systolic Blood Pressure: 115 mmHg     Is BP treated: No     HDL Cholesterol: 41 mg/dL     Total Cholesterol: 256 mg/dL Chest pain:  no Coronary artery disease:  no  DEPRESSION Mood status: stable Satisfied with current treatment?:  unsure Symptom severity: moderate  Duration of current treatment : chronic Side effects: no Medication compliance: excellent compliance Psychotherapy/counseling: no  Previous psychiatric medications: celexa Depressed mood: yes Anxious mood: yes Anhedonia: no Significant weight loss or gain: no Insomnia: no  Fatigue: yes Feelings of worthlessness or guilt: no Impaired concentration/indecisiveness: no Suicidal ideations: no Hopelessness: no Crying  spells: no Depression screen [facility] 2/9 01/30/2021 12/13/2020 09/14/2020 11/22/2019 12/31/2017  Decreased Interest 0 0 0 0 0  Down, Depressed, Hopeless 1 0 0 1 0  PHQ - 2 Score 1 0 0 1 0  Altered sleeping 3 3 1 1 1   Tired, decreased energy 3 1 2 1 1   Change in appetite 0 0 0 2 0  Feeling bad or failure about yourself  0 0 0 0 0  Trouble concentrating 0 0 0 1 0  Moving slowly or fidgety/restless 0 0 0 0 0  Suicidal thoughts 0 0 0 0 -  PHQ-9 Score 7 4 3 6 2   Difficult doing work/chores Not difficult at all Not difficult at all Not difficult at all Somewhat difficult -  Some recent data might be hidden   She currently lives with: alone Menopausal Symptoms: no  Functional Status Survey: Is the patient deaf or have difficulty hearing?: No Does the patient have difficulty seeing, even when wearing glasses/contacts?: No Does the patient have difficulty concentrating, remembering, or making decisions?: No Does the patient have difficulty walking or climbing stairs?: No Does the patient have difficulty dressing or bathing?: No Does the patient have difficulty doing errands alone such as visiting a doctor's office or shopping?: No  Fall Risk  01/30/2021 09/14/2020 12/21/2019 11/22/2019 11/20/2019  Falls in the past year? 0 0 0 0 0  Number falls in past yr: 0 0 0 - 0  Injury with Fall? 0 0 0 - 0  Risk for fall due to : - No Fall Risks - - -  Follow up -  Falls evaluation completed - - -    Depression Screen Depression screen [facility] 2/9 01/30/2021 12/13/2020 09/14/2020 11/22/2019 12/31/2017  Decreased Interest 0 0 0 0 0  Down, Depressed, Hopeless 1 0 0 1 0  PHQ - 2 Score 1 0 0 1 0  Altered sleeping 3 3 1 1 1   Tired, decreased energy 3 1 2 1 1   Change in appetite 0 0 0 2 0  Feeling bad or failure about yourself  0 0 0 0 0  Trouble concentrating 0 0 0 1 0  Moving slowly or fidgety/restless 0 0 0 0 0  Suicidal thoughts 0 0 0 0 -  PHQ-9 Score 7 4 3 6 2   Difficult doing work/chores Not difficult at all Not  difficult at all Not difficult at all Somewhat difficult -  Some recent data might be hidden   Advanced Directives Does patient have a HCPOA?    no Does patient have a living will or MOST form?  no  Past Medical History:  Past Medical History:  Diagnosis Date   Anxiety state, unspecified    Back pain    Chest pain    a. 2008 Cath: reportedly nl;  b. 09/2012 Lexi MV: EF 75%, soft tissue attenuation->Low risk.   COPD (chronic obstructive pulmonary disease) (HCC)    CTS (carpal tunnel syndrome)    Depression    GERD (gastroesophageal reflux disease)    Migraines    Neck pain    Nontoxic multinodular goiter    Other and unspecified hyperlipidemia    Other and unspecified ovarian cyst    left   Other chest pain    PAF (paroxysmal atrial fibrillation) (HCC)    a. 07/2007 Echo: EF 65%, mildly dil LA;  b. currently on propafenone;  c. CHA2DS2VASc = 1 (gender)-->No anticoagulation.   PTSD (post-traumatic stress disorder)    Tobacco use    Tobacco use disorder    Unspecified hypothyroidism    Unspecified sleep apnea    resolved s/p UPPP surgery   Vitamin B12 deficiency     Surgical History:  Past Surgical History:  Procedure Laterality Date   bladder tac     CARDIAC CATHETERIZATION  08/12/06   neg. Dr. 10/2012, cardio   CHOLECYSTECTOMY     FOOT SURGERY     bone removal from her foot   MRI lumber spine     03/12/06-abn   MRI pelvis  05/02/06   abn-[place], neurosurg   NASAL POLYP SURGERY     nasal ?   PARATHYROIDECTOMY     stress cardiolite  10/06/96   nl, EF 62%   stress myoview  10/25/03   Dr. 05/04/06, nl   THYROIDECTOMY  5/92   massive goiter   TONSILLECTOMY     TUBAL LIGATION  1988   UVULOPALATOPHARYNGOPLASTY     and tonsilectomy. [name] 10/04    Medications:  Current Outpatient Medications on File Prior to Visit  Medication Sig   baclofen (LIORESAL) 10 MG tablet TAKE ONE TABLET BY MOUTH THREE TIMES A DAY (Patient not taking: Reported on 12/28/2020)    levothyroxine (SYNTHROID) 125 MCG tablet Take 1 tablet (125 mcg total) by mouth daily with breakfast.   pantoprazole (PROTONIX) 20 MG tablet Take 1 tablet (20 mg total) by mouth daily.   propafenone (RYTHMOL) 225 MG tablet TAKE ONE TABLET BY MOUTH DAILY AS NEEDED FOR AFIB   No current facility-administered medications on file prior to visit.    Allergies:  Allergies  Allergen Reactions   Bee Venom Anaphylaxis   Penicillins Rash   Sulfonamide Derivatives Rash   Cardizem  [Diltiazem Hcl] Other (See Comments)    Other Reaction: severe hypotension w/ IV dose   Sulfa Antibiotics Hives   Tape Rash    Uncoded Allergy. Allergen: tape & bandaids    Social History:  Social History   Socioeconomic History   Marital status: Widowed    Spouse name: Not on file   Number of children: Not on file   Years of education: Not on file   Highest education level: Not on file  Occupational History   Not on file  Tobacco Use   Smoking status: Former    Packs/day: 0.50    Years: 20.00    Pack years: 10.00    Types: Cigarettes    Quit date: 09/30/2017    Years since quitting: 3.3   Smokeless tobacco: Never  Vaping Use   Vaping Use: Some days  Substance and Sexual Activity   Alcohol use: Yes    Alcohol/week: 0.0 standard drinks    Comment: Rare   Drug use: No   Sexual activity: Not Currently    Birth control/protection: Post-menopausal  Other Topics Concern   Not on file  Social History Narrative   Widowed. 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    Social Determinants of Health   Financial Resource Strain: Not on file  Food Insecurity: Not on file  Transportation Needs: Not on file  Physical Activity: Not on file  Stress: Not on file  Social Connections: Not on file  Intimate Partner Violence: Not on file   Social History   Tobacco Use  Smoking Status Former   Packs/day: 0.50   Years: 20.00   Pack years: 10.00   Types: Cigarettes   Quit date: 09/30/2017   Years  since quitting: 3.3  Smokeless Tobacco Never   Social History   Substance and Sexual Activity  Alcohol Use Yes   Alcohol/week: 0.0 standard drinks   Comment: Rare    Family History:  Family History  Problem Relation Age of Onset   Stroke Father    Heart disease Mother    Atrial fibrillation Mother    Congestive Heart Failure Mother    Atrial fibrillation Sister        s/p ablation   Breast cancer Sister 59   Hyperlipidemia Daughter    Heart disease Daughter        leaky valve   Atrial fibrillation Other    Heart attack Other        both sides   Stroke Other        both sides   Depression Other        both sides   Prostate cancer Neg Hx    Colon cancer Neg Hx     Past medical history, surgical history, medications, allergies, family history and social history reviewed with patient today and changes made to appropriate areas of the chart.   Review of Systems  Constitutional: Negative.   HENT:  Positive for tinnitus. Negative for congestion, ear discharge, ear pain, hearing loss, nosebleeds, sinus pain and sore throat.   Eyes: Negative.        Floaters  Respiratory: Negative.  Negative for stridor.   Cardiovascular: Negative.   Gastrointestinal: Negative.   Genitourinary: Negative.   Musculoskeletal:  Positive for back pain and myalgias. Negative for falls, joint pain and neck pain.  Skin: Negative.   Neurological:  Negative.   Endo/Heme/Allergies: Negative.   Psychiatric/Behavioral: Negative.     All other ROS negative except what is listed above and in the HPI.      Objective:    BP 115/75   Pulse 74   Ht 5\' 3"  (1.6 m)   Wt 210 lb (95.3 kg)   BMI 37.20 kg/m   Wt Readings from Last 3 Encounters:  01/30/21 210 lb (95.3 kg)  12/13/20 208 lb 6.4 oz (94.5 kg)  11/02/20 214 lb 3.2 oz (97.2 kg)    Physical Exam Vitals and nursing note reviewed.  Constitutional:      General: She is not in acute distress.    Appearance: Normal appearance. She is not  ill-appearing, toxic-appearing or diaphoretic.  HENT:     Head: Normocephalic and atraumatic.     Right Ear: Tympanic membrane, ear canal and external ear normal. There is no impacted cerumen.     Left Ear: Tympanic membrane, ear canal and external ear normal. There is no impacted cerumen.     Nose: Nose normal. No congestion or rhinorrhea.     Mouth/Throat:     Mouth: Mucous membranes are moist.     Pharynx: Oropharynx is clear. No oropharyngeal exudate or posterior oropharyngeal erythema.  Eyes:     General: No scleral icterus.       Right eye: No discharge.        Left eye: No discharge.     Extraocular Movements: Extraocular movements intact.     Conjunctiva/sclera: Conjunctivae normal.     Pupils: Pupils are equal, round, and reactive to light.  Neck:     Vascular: No carotid bruit.  Cardiovascular:     Rate and Rhythm: Normal rate and regular rhythm.     Pulses: Normal pulses.     Heart sounds: No murmur heard.   No friction rub. No gallop.  Pulmonary:     Effort: Pulmonary effort is normal. No respiratory distress.     Breath sounds: Normal breath sounds. No stridor. No wheezing, rhonchi or rales.  Chest:     Chest wall: No tenderness.  Abdominal:     General: Abdomen is flat. Bowel sounds are normal. There is no distension.     Palpations: Abdomen is soft. There is no mass.     Tenderness: There is no abdominal tenderness. There is no right CVA tenderness, left CVA tenderness, guarding or rebound.     Hernia: No hernia is present.  Genitourinary:    Comments: Breast and pelvic exams deferred with shared decision making Musculoskeletal:        General: No swelling, tenderness, deformity or signs of injury.     Cervical back: Normal range of motion and neck supple. No rigidity. No muscular tenderness.     Right lower leg: No edema.     Left lower leg: No edema.  Lymphadenopathy:     Cervical: No cervical adenopathy.  Skin:    General: Skin is warm and dry.      Capillary Refill: Capillary refill takes less than 2 seconds.     Coloration: Skin is not jaundiced or pale.     Findings: No bruising, erythema, lesion or rash.  Neurological:     General: No focal deficit present.     Mental Status: She is alert and oriented to person, place, and time. Mental status is at baseline.     Cranial Nerves: No cranial nerve deficit.     Sensory: No sensory deficit.  Motor: No weakness.     Coordination: Coordination normal.     Gait: Gait normal.     Deep Tendon Reflexes: Reflexes normal.  Psychiatric:        Mood and Affect: Mood normal.        Behavior: Behavior normal.        Thought Content: Thought content normal.        Judgment: Judgment normal.    6CIT Screen 01/30/2021  What Year? 0 points  What month? 0 points  What time? 0 points  Count back from 20 0 points  Months in reverse 0 points  Repeat phrase 2 points  Total Score 2     Results for orders placed or performed in visit on 04/26/20  Celiac Disease Ab Screen w/Rfx  Result Value Ref Range   Antigliadin Abs, IgA 4 0 - 19 units   Transglutaminase IgA <2 0 - 3 U/mL   IgA/Immunoglobulin A, Serum 380 (H) 87 - 352 mg/dL      Assessment & Plan:   Problem List Items Addressed This Visit       Respiratory   COPD (chronic obstructive pulmonary disease) (HCC)    Under good control on current regimen. Continue current regimen. Continue to monitor. Call with any concerns. Refills given. Labs drawn today.       Relevant Orders   Comprehensive metabolic panel     Digestive   GERD (gastroesophageal reflux disease)    Under good control on current regimen. Continue current regimen. Continue to monitor. Call with any concerns. Refills given.        Relevant Orders   CBC with Differential/Platelet   Comprehensive metabolic panel     Endocrine   Hypothyroidism    Rechecking labs today. Await results. Treat as needed.       Relevant Orders   Comprehensive metabolic panel    TSH     Genitourinary   CKD (chronic kidney disease) stage 3, GFR 30-59 ml/min (HCC)    Rechecking labs today. Await results.       Relevant Orders   Comprehensive metabolic panel   Urinalysis, Routine w reflex microscopic   Microalbumin, Urine Waived     Other   Hyperlipidemia    Under good control on current regimen. Continue current regimen. Continue to monitor. Call with any concerns. Refills given. Labs drawn today.       Relevant Medications   atorvastatin (LIPITOR) 40 MG tablet   Other Relevant Orders   Comprehensive metabolic panel   Lipid Panel w/o Chol/HDL Ratio   Depression, major, recurrent, moderate (HCC)    Under good control on current regimen. Continue current regimen. Continue to monitor. Call with any concerns. Refills given.        Relevant Medications   citalopram (CELEXA) 40 MG tablet   Other Relevant Orders   Comprehensive metabolic panel   TSH   Advance directive discussed with patient    A voluntary discussion about advance care planning including the explanation and discussion of advance directives was extensively discussed  with the patient for 3 minutes with patient present.  Explanation about the health care proxy and Living will was reviewed and packet with forms with explanation of how to fill them out was given.  During this discussion, the patient was able to identify a health care proxy as her daughter and plans to fill out the paperwork required.  Patient was offered a separate Advance Care Planning visit for further assistance with forms.  Other Visit Diagnoses     Welcome to Medicare preventive visit    -  Primary   Preventative care discussed today as below.    Screening for diabetes mellitus       Labs drawn today. Await results.    Relevant Orders   Bayer DCA Hb A1c Waived   Comprehensive metabolic panel   Screening for osteoporosis       DEXA ordered.    Relevant Orders   DG Bone Density   Encounter for screening  mammogram for malignant neoplasm of breast       Mammogram ordered today.   Relevant Orders   MM DIAG BREAST TOMO BILATERAL   Screening for cardiovascular condition       EKG done today.   Relevant Orders   EKG 12-Lead (Completed)   Screening for colon cancer       Cologuard ordered today.   Relevant Orders   Cologuard       Preventative Services:  AAA screening: N/A Health Risk Assessment and Personalized Prevention Plan: Done today Bone Mass Measurements: Ordered today Breast Cancer Screening: Ordered today CVD Screening: Done today Cervical Cancer Screening: Will return for it Colon Cancer Screening: Ordered today Depression Screening: Done today Diabetes Screening: Done today Glaucoma Screening: See your eye doctor Hepatitis B vaccine: N/A Hepatitis C screening: Up to date HIV Screening: Up to date Flu Vaccine: Declined Lung cancer Screening: N/A Obesity Screening: Done today Pneumonia Vaccines (2): Prevnar given today STI Screening: N/A  Follow up plan: Return in about 6 months (around 07/30/2021) for 6 month with pap.   LABORATORY TESTING:  - Pap smear: will return in 6 months  IMMUNIZATIONS:   - Tdap: Tetanus vaccination status reviewed: last tetanus booster within 10 years. - Influenza: Refused - Pneumovax: Refused - Prevnar: Administered today - Shingrix vaccine: Given elsewhere - COVID vaccine: up to date  SCREENING: -Mammogram: Ordered today  - Colonoscopy: Cologuard ordered today  - Bone Density: Ordered today   PATIENT COUNSELING:   Advised to take 1 mg of folate supplement per day if capable of pregnancy.   Sexuality: Discussed sexually transmitted diseases, partner selection, use of condoms, avoidance of unintended pregnancy  and contraceptive alternatives.   Advised to avoid cigarette smoking.  I discussed with the patient that most people either abstain from alcohol or drink within safe limits (<=14/week and <=4 drinks/occasion for males,  <=7/weeks and <= 3 drinks/occasion for females) and that the risk for alcohol disorders and other health effects rises proportionally with the number of drinks per week and how often a drinker exceeds daily limits.  Discussed cessation/primary prevention of drug use and availability of treatment for abuse.   Diet: Encouraged to adjust caloric intake to maintain  or achieve ideal body weight, to reduce intake of dietary saturated fat and total fat, to limit sodium intake by avoiding high sodium foods and not adding table salt, and to maintain adequate dietary potassium and calcium preferably from fresh fruits, vegetables, and low-fat dairy products.    stressed the importance of regular exercise  Injury prevention: Discussed safety belts, safety helmets, smoke detector, smoking near bedding or upholstery.   Dental health: Discussed importance of regular tooth brushing, flossing, and dental visits.    NEXT PREVENTATIVE PHYSICAL DUE IN 1 YEAR. Return in about 6 months (around 07/30/2021) for 6 month with pap.

## 2021-01-30 NOTE — Assessment & Plan Note (Signed)
Rechecking labs today. Await results.  

## 2021-01-30 NOTE — Assessment & Plan Note (Signed)
A voluntary discussion about advance care planning including the explanation and discussion of advance directives was extensively discussed  with the patient for 3 minutes with patient present.  Explanation about the health care proxy and Living will was reviewed and packet with forms with explanation of how to fill them out was given.  During this discussion, the patient was able to identify a health care proxy as her daughter and plans to fill out the paperwork required.  Patient was offered a separate Advance Care Planning visit for further assistance with forms.    

## 2021-01-30 NOTE — Assessment & Plan Note (Signed)
Rechecking labs today. Await results. Treat as needed.  °

## 2021-01-30 NOTE — Patient Instructions (Addendum)
Call to schedule your mammogram and bone density:  [facility] at [facility]  Address: [address] [place], [place], [place] [address]  Phone: [phone]  Preventative Services:  AAA screening: N/A Health Risk Assessment and Personalized Prevention Plan: Done today Bone Mass Measurements: Ordered today Breast Cancer Screening: Ordered today CVD Screening: Done today Cervical Cancer Screening: Will return for it Colon Cancer Screening: Ordered today Depression Screening: Done today Diabetes Screening: Done today Glaucoma Screening: See your eye doctor Hepatitis B vaccine: N/A Hepatitis C screening: Up to date HIV Screening: Up to date Flu Vaccine: Declined Lung cancer Screening: N/A Obesity Screening: Done today Pneumonia Vaccines (2): Prevnar given today STI Screening: N/A

## 2021-01-31 ENCOUNTER — Ambulatory Visit: Payer: Medicare Other | Admitting: Physical Therapy

## 2021-01-31 ENCOUNTER — Encounter: Payer: Self-pay | Admitting: Physical Therapy

## 2021-01-31 DIAGNOSIS — M6281 Muscle weakness (generalized): Secondary | ICD-10-CM

## 2021-01-31 DIAGNOSIS — R293 Abnormal posture: Secondary | ICD-10-CM

## 2021-01-31 DIAGNOSIS — M545 Low back pain, unspecified: Secondary | ICD-10-CM

## 2021-01-31 DIAGNOSIS — M546 Pain in thoracic spine: Secondary | ICD-10-CM | POA: Diagnosis not present

## 2021-01-31 DIAGNOSIS — R262 Difficulty in walking, not elsewhere classified: Secondary | ICD-10-CM

## 2021-01-31 LAB — CBC WITH DIFFERENTIAL/PLATELET
Basophils Absolute: 0.1 10*3/uL (ref 0.0–0.2)
Basos: 1 %
EOS (ABSOLUTE): 0.1 10*3/uL (ref 0.0–0.4)
Eos: 1 %
Hematocrit: 46 % (ref 34.0–46.6)
Hemoglobin: 15.4 g/dL (ref 11.1–15.9)
Immature Grans (Abs): 0 10*3/uL (ref 0.0–0.1)
Immature Granulocytes: 0 %
Lymphocytes Absolute: 3.8 10*3/uL — ABNORMAL HIGH (ref 0.7–3.1)
Lymphs: 42 %
MCH: 31.3 pg (ref 26.6–33.0)
MCHC: 33.5 g/dL (ref 31.5–35.7)
MCV: 94 fL (ref 79–97)
Monocytes Absolute: 0.6 10*3/uL (ref 0.1–0.9)
Monocytes: 7 %
Neutrophils Absolute: 4.5 10*3/uL (ref 1.4–7.0)
Neutrophils: 49 %
Platelets: 258 10*3/uL (ref 150–450)
RBC: 4.92 x10E6/uL (ref 3.77–5.28)
RDW: 11.9 % (ref 11.7–15.4)
WBC: 9.1 10*3/uL (ref 3.4–10.8)

## 2021-01-31 LAB — COMPREHENSIVE METABOLIC PANEL
ALT: 11 IU/L (ref 0–32)
AST: 17 IU/L (ref 0–40)
Albumin/Globulin Ratio: 2.1 (ref 1.2–2.2)
Albumin: 4.5 g/dL (ref 3.8–4.8)
Alkaline Phosphatase: 86 IU/L (ref 44–121)
BUN/Creatinine Ratio: 11 — ABNORMAL LOW (ref 12–28)
BUN: 9 mg/dL (ref 8–27)
Bilirubin Total: 0.2 mg/dL (ref 0.0–1.2)
CO2: 25 mmol/L (ref 20–29)
Calcium: 9.1 mg/dL (ref 8.7–10.3)
Chloride: 103 mmol/L (ref 96–106)
Creatinine, Ser: 0.81 mg/dL (ref 0.57–1.00)
Globulin, Total: 2.1 g/dL (ref 1.5–4.5)
Glucose: 75 mg/dL (ref 70–99)
Potassium: 4.4 mmol/L (ref 3.5–5.2)
Sodium: 143 mmol/L (ref 134–144)
Total Protein: 6.6 g/dL (ref 6.0–8.5)
eGFR: 81 mL/min/{1.73_m2} (ref >59)

## 2021-01-31 LAB — LIPID PANEL W/O CHOL/HDL RATIO
Cholesterol, Total: 149 mg/dL (ref 100–199)
HDL: 39 mg/dL — ABNORMAL LOW (ref >39)
LDL Chol Calc (NIH): 82 mg/dL (ref 0–99)
Triglycerides: 163 mg/dL — ABNORMAL HIGH (ref 0–149)
VLDL Cholesterol Cal: 28 mg/dL (ref 5–40)

## 2021-01-31 LAB — TSH: TSH: 1.88 u[IU]/mL (ref 0.450–4.500)

## 2021-01-31 NOTE — Therapy (Signed)
[place] [facility] PHYSICAL AND SPORTS MEDICINE [address] [place], [address] Phone: [phone]   Fax:  [phone]  Physical Therapy Treatment  Patient Details  Name: [name] MRN: [id number] Date of Birth: 09/30/1955 Referring Provider (PT): Olevia Perches, DO.   Encounter Date: 01/31/2021   PT End of Session - 01/31/21 1657     Visit Number 9    Number of Visits 24    Date for PT Re-Evaluation 03/22/21    Authorization Type [employer] (Reporting Period From 12/27/2020)    Authorization Time Period [employer]  VL: 30 PT/OT/Chiro per service yr - 30 remain  plan runs 05/07/20 - 02/03/21    Authorization - Visit Number 9    Authorization - Number of Visits 30    Progress Note Due on Visit 10    PT Start Time 1605    PT Stop Time 1645    PT Time Calculation (min) 40 min    Activity Tolerance Patient tolerated treatment well;Patient limited by pain    Behavior During Therapy [facility] for tasks assessed/performed             Past Medical History:  Diagnosis Date   Anxiety state, unspecified    Back pain    Chest pain    a. 2008 Cath: reportedly nl;  b. 09/2012 Lexi MV: EF 75%, soft tissue attenuation->Low risk.   COPD (chronic obstructive pulmonary disease) (HCC)    CTS (carpal tunnel syndrome)    Depression    GERD (gastroesophageal reflux disease)    Migraines    Neck pain    Nontoxic multinodular goiter    Other and unspecified hyperlipidemia    Other and unspecified ovarian cyst    left   Other chest pain    PAF (paroxysmal atrial fibrillation) (HCC)    a. 07/2007 Echo: EF 65%, mildly dil LA;  b. currently on propafenone;  c. CHA2DS2VASc = 1 (gender)-->No anticoagulation.   PTSD (post-traumatic stress disorder)    Tobacco use    Tobacco use disorder    Unspecified hypothyroidism    Unspecified sleep apnea    resolved s/p UPPP surgery   Vitamin B12 deficiency     Past Surgical History:  Procedure Laterality Date    bladder tac     CARDIAC CATHETERIZATION  08/12/06   neg. Dr. Welton Flakes, cardio   CHOLECYSTECTOMY     FOOT SURGERY     bone removal from her foot   MRI lumber spine     03/12/06-abn   MRI pelvis  05/02/06   abn-[place], neurosurg   NASAL POLYP SURGERY     nasal ?   PARATHYROIDECTOMY     stress cardiolite  10/06/96   nl, EF 62%   stress myoview  10/25/03   Dr. Park Breed, nl   THYROIDECTOMY  5/92   massive goiter   TONSILLECTOMY     TUBAL LIGATION  1988   UVULOPALATOPHARYNGOPLASTY     and tonsilectomy. [name] 10/04    There were no vitals filed for this visit.   Subjective Assessment - 01/31/21 1605     Subjective Patient reports she and her back is aggrevated today. She saw her PCP yesterday and got a pneumonia vaccination in her right arm and that is hurting from scapular region  down to mid upper arm. She states her arm feels hot as well. She states she felt better after last PT session and had about 1.5 hours of work when  she got home. She rolled something up behind her and it felt good while sitting there but it was still hard to get up. Reports current pain 6/10 burning in the familiar region of the right lower back.    Pertinent History Patient is a 65 year old female who presents to outpatient physical therapy with a referral for acute right sided low back pain without sciatica. This patient's chief complaints consist of thoracic and lumbar back pain, leading to the following functional deficits: difficulty with walking, sitting, standing, carrying objects, lifting, going up and down stairs, getting in/out of the car, performing ADLs such as bathing, showering, getting dressed. Relevant past medical history and comorbidities include scoliosis, OSA, anxiety, chest pain, COPD, depression, GERD, migraines, neck pain, hyperlipidemia, paroxysmal atrial fibrilation, hypothyroidism,  PTSD, tobacco use, removal of thyroid and parathyroid glands. Patient denies history of dizziness, double  vision, cancer, bowel and bladder changes, numbness or tingling in groin area, stroke, seizure, unexplained weight loss, and lung or major heart problems.    Limitations Sitting;Lifting;Walking;Standing;House hold activities   Difficulty with walking, sitting, standing, carrying objects, lifting, going up and down stairs, getting in/out of the car, performing ADLs such as bathing, showering, getting dressed   How long can you walk comfortably? Unable to walk for a few blocks or into a store due to back pain    Diagnostic tests X-Ray (12/14/2020)    Patient Stated Goals Patient would like to "be more comfortable"    Currently in Pain? Yes    Pain Score 6     Pain Location Back    Pain Onset More than a month ago            TREATMENT:  Manual Therapy: to increase joint mobility, support increased overall ROM, and provide pain relief to joint musculature.   SUPINE  R hip long axis distraction 4x30 seconds.  PRONE Prone CPA and right UPA joint mobilizations from ~ T6-L5. Grade II-IV. 15-20 repetitions at each joint. B scapular mobilization all directions and distraction   STM to right thoracic, periscapular, and lumbar musculature to decrease pain and muscle tension.  LAD to right hip 1x30 seconds (pt request).     Therapeutic exercise: to centralize symptoms and improve ROM, strength, muscular endurance, and activity tolerance required for successful completion of functional activities.   - Prone press ups on plinth table, 2x10 - seated lat pull down with downwards shrug, 1x10 at 15#, 2x10 at 35# - partial hang from Lat pull down bar secured at top of machine, 2x20 seconds to stretch out spine.  - forward monster walks with green theraband around knees, 2x10 feet - forward monster walks with green theraband around ankles, 2x25 feet - backwards monster walks with green theraband around ankles, 1x25 feet. SBA for safety.   Pt required multimodal cuing for proper technique and to  facilitate improved neuromuscular control, strength, range of motion, and functional ability resulting in improved performance and form.      HOME EXERCISE PLAN: Access Code: [email or web address] URL: [email or web address] Date: 01/02/2021 Prepared by: Norton Blizzard  Exercises   Sidelying Thoracic Rotation with Open Book - 1 x daily - 1 sets - 15 reps Low Trap Setting at Wall - 1 x daily - 3 sets - 10 reps Standing Row with Anchored Resistance - 1 x daily - 4 sets - 10 reps    PT Education - 01/31/21 1656     Education Details exercise purpose and form.    Person(s) Educated  Patient    Methods Explanation;Demonstration;Tactile cues;Verbal cues    Comprehension Verbalized understanding;Returned demonstration;Verbal cues required;Tactile cues required;Need further instruction              PT Short Term Goals - 01/10/21 1524       PT SHORT TERM GOAL #1   Title Patient will be independent with initial home program for self-management of symptoms and to improve strength/mobility and functional independence with ADLs.    Baseline HEP to be provided at 2nd session    Time 3    Period Weeks    Status Achieved    Target Date 01/18/21               PT Long Term Goals - 01/19/21 1759       PT LONG TERM GOAL #1   Title Patient will be independent with long term home program for self-management of symptoms and to improve strength/mobility and functional independence with ADLs.    Baseline Patient to be provided with initial HEP at next session    Time 12    Period Weeks    Status New   TARGET DATE FOR ALL LONG TERM GOALS:  03/22/2021     PT LONG TERM GOAL #2   Title Demonstrate improved FOTO score to 56 or greater to demonstrate improvement in overall condition and self-reported functional ability.    Baseline 40 (12/27/2020); 44 (01/19/2021)    Time 12    Period Weeks    Status New      PT LONG TERM GOAL #3   Title Reduce pain with functional activities to equal or  less than 3/10 to allow patient to complete usual activities including ADLs, IADLs, reacreational activities, social engagements, and usual household work activities with less difficulty.    Baseline W: 10/10.      PT LONG TERM GOAL #4   Title Patient will have full lumbar AROM with no compensations or increase in pain in all planes except intermittent end range discomfort to allow patient to complete valued activities with less difficulty.    Baseline Limited lumbar AROM with reporduction of concordant pain in all planes (12/27/2020)    Time 12    Period Weeks    Status New      PT LONG TERM GOAL #5   Title Patient will improve LE strength to 5/5 for improved general mobility, strength, and function.    Baseline 3/5 strength in bilateral hips - limited by pain    Time 12    Period Weeks    Status New      PT LONG TERM GOAL #6   Title Patient will be able to ambulate 1500 feet in 6 minutes in order to demonstrate improvements in activity tolerance and community ambulation.    Baseline Patient reports difficulty walking a few blocks - 6 minute walk test to be measured at subsequent session.    Time 12    Period Weeks    Status New                   Plan - 01/31/21 1654     Clinical Impression Statement Patient tolerated treatment well overall with decreased pain to 4/10 by end of session. However, burning pain near the lower right thoracic spine continues to be elevated over the last few visits and is having less response to treatment than at initial few PT session. Today's session focused on pain relief and strengthening as tolerated. Patient would benefit  from continued management of limiting condition by skilled physical therapist to address remaining impairments and functional limitations to work towards stated goals and return to PLOF or maximal functional independence.    Personal Factors and Comorbidities Profession;Time since onset of  injury/illness/exacerbation;Comorbidity 3+;Past/Current Experience;Fitness    Comorbidities scoliosis, OSA, anxiety, chest pain, COPD, depression, GERD, migraines, neck pain, hyperlipidemia, paroxysmal atrial fibrilation, hypothyroidism,  PTSD, tobacco use    Examination-Activity Limitations Bathing;Carry;Lift;Sit;Stand;Bed Mobility;Locomotion Level;Sleep;Bend;Dressing;Reach Overhead;Squat;Transfers;Hygiene/Grooming;Self Feeding;Stairs   Difficulty with walking, sitting, standing, carrying objects, lifting, going up and down stairs, getting in/out of the car, performing ADLs such as bathing, showering, getting dressed   Examination-Participation Restrictions Cleaning;Yard Work;Occupation;Community Activity;Laundry;Meal Prep    Stability/Clinical Decision Making Stable/Uncomplicated    Rehab Potential Fair    PT Frequency 2x / week    PT Duration 12 weeks    PT Treatment/Interventions ADLs/Self Care Home Management;Electrical Stimulation;Cryotherapy;Traction;Moist Heat;Gait training;Stair training;Functional mobility training;Balance training;Therapeutic activities;Therapeutic exercise;Neuromuscular re-education;Patient/family education;Manual techniques;Passive range of motion;Dry needling;Spinal Manipulations;Joint Manipulations;Aquatic Therapy    PT Next Visit Plan Plan to progress LE/back strengtheing as tolerated.    PT Home Exercise Plan Medbridge code: [email or web address].  progress as tolerated.    Consulted and Agree with Plan of Care Patient             Patient will benefit from skilled therapeutic intervention in order to improve the following deficits and impairments:  Abnormal gait, Decreased activity tolerance, Decreased endurance, Decreased range of motion, Decreased strength, Hypomobility, Impaired perceived functional ability, Impaired UE functional use, Improper body mechanics, Pain, Obesity, Postural dysfunction, Impaired flexibility, Decreased mobility, Difficulty walking  Visit  Diagnosis: Pain in thoracic spine  Acute bilateral low back pain, unspecified whether sciatica present  Difficulty in walking, not elsewhere classified  Muscle weakness (generalized)  Abnormal posture     Problem List Patient Active Problem List   Diagnosis Date Noted   Advance directive discussed with patient 01/30/2021   Scoliosis 08/02/2015   Chronic fatigue 02/06/2015   CKD (chronic kidney disease) stage 3, GFR 30-59 ml/min (HCC) 02/06/2015   Nasal polyp, posterior 02/03/2015   Muscle spasms of head and/or neck 11/11/2014   Neck pain of over 3 months duration 10/21/2014   GERD (gastroesophageal reflux disease)    Hypothyroidism    Migraines    Depression, major, recurrent, moderate (HCC) 07/20/2014    Class: Chronic   OSA (obstructive sleep apnea) 02/15/2014   Atrial fibrillation (HCC) 08/29/2012   Dyspnea on exertion 08/29/2012   DEGENERATIVE JOINT DISEASE 02/25/2008   Hyperlipidemia 01/29/2007   COPD (chronic obstructive pulmonary disease) (HCC) 01/29/2007   Anxiety state 01/28/2007   OVARIAN CYST, LEFT 05/14/2006    Luretha Murphy. Ilsa Iha, PT, DPT 01/31/21, 4:58 PM   [facility] PHYS[facility] AND SPORTS MEDICINE [address], [place], [address] Phone: [phone]   Fax:  [phone]  Name: [name] MRN: [id number] Date of Birth: [birth year]

## 2021-02-02 ENCOUNTER — Ambulatory Visit: Payer: Medicare Other | Admitting: Physical Therapy

## 2021-02-02 ENCOUNTER — Encounter: Payer: Self-pay | Admitting: Physical Therapy

## 2021-02-02 DIAGNOSIS — M545 Low back pain, unspecified: Secondary | ICD-10-CM

## 2021-02-02 DIAGNOSIS — M6281 Muscle weakness (generalized): Secondary | ICD-10-CM

## 2021-02-02 DIAGNOSIS — M546 Pain in thoracic spine: Secondary | ICD-10-CM | POA: Diagnosis not present

## 2021-02-02 DIAGNOSIS — R262 Difficulty in walking, not elsewhere classified: Secondary | ICD-10-CM

## 2021-02-02 DIAGNOSIS — R293 Abnormal posture: Secondary | ICD-10-CM

## 2021-02-02 NOTE — Therapy (Signed)
[facility] [address] [place], [address] Phone: [phone]   Fax:  [phone]  Physical Therapy Treatment / Progress Note Dates of reporting from 12/27/2020 - 02/02/2021  Patient Details  Name: [name] MRN: [id number] Date of Birth: 12/15/1955 Referring Provider (PT): Park Liter, DO.   Encounter Date: 02/02/2021   PT End of Session - 02/02/21 1613     Visit Number 10    Number of Visits 24    Date for PT Re-Evaluation 03/22/21    Authorization Type MEDICARE PART B Reporting Period From 12/27/2020    Authorization Time Period --    Authorization - Visit Number --    Authorization - Number of Visits --    Progress Note Due on Visit 10    PT Start Time 1601    PT Stop Time 1641    PT Time Calculation (min) 40 min    Activity Tolerance Patient tolerated treatment well    Behavior During Therapy [facility] for tasks assessed/performed             Past Medical History:  Diagnosis Date   Anxiety state, unspecified    Back pain    Chest pain    a. 2008 Cath: reportedly nl;  b. 09/2012 Lexi MV: EF 75%, soft tissue attenuation->Low risk.   COPD (chronic obstructive pulmonary disease) (HCC)    CTS (carpal tunnel syndrome)    Depression    GERD (gastroesophageal reflux disease)    Migraines    Neck pain    Nontoxic multinodular goiter    Other and unspecified hyperlipidemia    Other and unspecified ovarian cyst    left   Other chest pain    PAF (paroxysmal atrial fibrillation) ([facility])    a. 07/2007 Echo: EF 65%, mildly dil LA;  b. currently on propafenone;  c. CHA2DS2VASc = 1 (gender)-->No anticoagulation.   PTSD (post-traumatic stress disorder)    Tobacco use    Tobacco use disorder    Unspecified hypothyroidism    Unspecified sleep apnea    resolved s/p UPPP surgery   Vitamin B12 deficiency     Past Surgical History:  Procedure Laterality Date   bladder tac     CARDIAC CATHETERIZATION   08/12/06   neg. Dr. Humphrey Rolls, cardio   CHOLECYSTECTOMY     FOOT SURGERY     bone removal from her foot   MRI lumber spine     03/12/06-abn   MRI pelvis  05/02/06   abn-[place], neurosurg   NASAL POLYP SURGERY     nasal ?   PARATHYROIDECTOMY     stress cardiolite  10/06/96   nl, EF 62%   stress myoview  10/25/03   Dr. Chancy Milroy, nl   THYROIDECTOMY  5/92   massive goiter   TONSILLECTOMY     TUBAL LIGATION  1988   UVULOPALATOPHARYNGOPLASTY     and tonsilectomy. [name] 10/04    There were no vitals filed for this visit.   Subjective Assessment - 02/02/21 1603     Subjective Patient reports she feels PT is going well. She states she can put her hands behind her head. Her pain has improved so she can do a lot more as long as it doesn't catch. She states she did some of the banded walking and her hips are sore. Reports her current pain is 5/10 buring in her right CT region. States the catching she gets is in the base  of the spine. She feels that the burning will likely never go away. Pain has been up to 7.5/10 in the last week (when she had an aggrevation). HEP is going well.    Pertinent History Patient is a 65 year old female who presents to outpatient physical therapy with a referral for acute right sided low back pain without sciatica. This patient's chief complaints consist of thoracic and lumbar back pain, leading to the following functional deficits: difficulty with walking, sitting, standing, carrying objects, lifting, going up and down stairs, getting in/out of the car, performing ADLs such as bathing, showering, getting dressed. Relevant past medical history and comorbidities include scoliosis, OSA, anxiety, chest pain, COPD, depression, GERD, migraines, neck pain, hyperlipidemia, paroxysmal atrial fibrilation, hypothyroidism,  PTSD, tobacco use, removal of thyroid and parathyroid glands. Patient denies history of dizziness, double vision, cancer, bowel and bladder changes, numbness or  tingling in groin area, stroke, seizure, unexplained weight loss, and lung or major heart problems.    Limitations Sitting;Lifting;Walking;Standing;House hold activities   Difficulty with walking, sitting, standing, carrying objects, lifting, going up and down stairs, getting in/out of the car, performing ADLs such as bathing, showering, getting dressed   How long can you walk comfortably? Unable to walk for a few blocks or into a store due to back pain    Diagnostic tests X-Ray (12/14/2020)    Patient Stated Goals Patient would like to "be more comfortable"    Currently in Pain? Yes    Pain Score 5     Pain Onset More than a month ago                OBJECTIVE  SELF-REPORTED FUNCTION FOTO score: 53/100 (lumbar spine questionnaire)   SPINE MOTION Lumbar AROM: Flexion: Palms to floor (normal for patient), no change in pain.  Extension: = 75% no increased pain  Rotation: R = 100% no increase in pain,  L = 100% pain at right lower thoracic spine.  Side Flexion: R = finger to proximal patella,  L = finger 1 inch proximal to patella, increased pain at right lower thoracic spine.    LE MUSCLE PERFORMANCE (MMT):   12/27/20 02/02/21 Date  Joint/Motion R/L R/L R/L  Hip        Flexion 4*/4* 5/5     Extension (knee ext) 3*/3+*  4+*/4*    Abduction 4*/4* 4-/4     Adduction 4*/4*   4+* /5    Knee        Extension 3+/4+^  5/5    Flexion 5/5 5/5     Ankle/Foot        Dorsiflexion  5/5 5/5     Plantarflexion 5/5  5/5     *denotes reproduction of concordant pain  ^ denotes L knee pain  FUNCTIONAL TESTING 6 Minute Walk Test: 1300 feet pain up to 6/10 burning at right lower thoracic spine. Discontinued at 5 minutes per patient preference due to shortness of breath and pain.   TREATMENT:   Therapeutic exercise: to centralize symptoms and improve ROM, strength, muscular endurance, and activity tolerance required for successful completion of functional activities.  - testing to assess  progress (see objective above).   Manual therapy: to reduce pain and tissue tension, improve range of motion, neuromodulation, in order to promote improved ability to complete functional activities. PRONE B scapular mobilization all directions and distraction   STM to right thoracic, B periscapular musculature to decrease pain and muscle tension.   Pt required multimodal  cuing for proper technique and to facilitate improved neuromuscular control, strength, range of motion, and functional ability resulting in improved performance and form.      HOME EXERCISE PLAN: Access Code: [email or web address] URL: [email or web address] Date: 01/02/2021 Prepared by: Rosita Kea  Exercises   Sidelying Thoracic Rotation with Open Book - 1 x daily - 1 sets - 15 reps Low Trap Setting at [place] 1 x daily - 3 sets - 10 reps Standing Row with Anchored Resistance - 1 x daily - 4 sets - 10 reps      PT Education - 02/02/21 1612     Education Details exercise purpose and form. poc, progress    Person(s) Educated Patient    Methods Explanation;Demonstration;Tactile cues;Verbal cues    Comprehension Verbalized understanding;Returned demonstration;Verbal cues required;Tactile cues required;Need further instruction              PT Short Term Goals - 01/10/21 1524       PT SHORT TERM GOAL #1   Title Patient will be independent with initial home program for self-management of symptoms and to improve strength/mobility and functional independence with ADLs.    Baseline HEP to be provided at 2nd session    Time 3    Period Weeks    Status Achieved    Target Date 01/18/21               PT Long Term Goals - 02/02/21 1620       PT LONG TERM GOAL #1   Title Patient will be independent with long term home program for self-management of symptoms and to improve strength/mobility and functional independence with ADLs.    Baseline Patient to be provided with initial HEP at next session  (12/27/2020); patient participating well on appropriate HEP for current level of recovery (02/02/2021);    Time 12    Period Weeks    Status Partially Met   TARGET DATE FOR ALL LONG TERM GOALS:  03/22/2021     PT LONG TERM GOAL #2   Title Demonstrate improved FOTO score to 56 or greater to demonstrate improvement in overall condition and self-reported functional ability.    Baseline 40 (12/27/2020); 44 (01/19/2021): 53 (02/02/2021);    Time 12    Period Weeks    Status Partially Met      PT LONG TERM GOAL #3   Title Reduce pain with functional activities to equal or less than 3/10 to allow patient to complete usual activities including ADLs, IADLs, reacreational activities, social engagements, and usual household work activities with less difficulty.    Baseline W: 10/10. (12/27/2020); 7.5/10 less often high than before (02/02/2021);    Time 12    Period Weeks    Status Partially Met      PT LONG TERM GOAL #4   Title Patient will have full lumbar AROM with no compensations or increase in pain in all planes except intermittent end range discomfort to allow patient to complete valued activities with less difficulty.    Baseline Limited lumbar AROM with reporduction of concordant pain in all planes (12/27/2020): improved but still has some burning pain on R lower thoracic spine (02/02/2021);    Time 12    Period Weeks    Status Partially Met      PT LONG TERM GOAL #5   Title Patient will improve LE strength to 5/5 for improved general mobility, strength, and function.    Baseline 3/5 strength in bilateral hips - limited by pain (  12/27/2020); lowest rating 4-/5 now (02/02/2021);    Time 12    Period Weeks    Status Partially Met      PT LONG TERM GOAL #6   Title Patient will be able to ambulate 1500 feet in 6 minutes in order to demonstrate improvements in activity tolerance and community ambulation.    Baseline Patient reports difficulty walking a few blocks - 6 minute walk test to be measured  at subsequent session (12/27/2020); 1300 feet pain up to 6/10 burning at right lower thoracic spine. Discontinued at 5 minutes per patient preference due to shortness of breath and pain. (02/02/2021)    Time 12    Period Weeks    Status Partially Met                   Plan - 02/02/21 1931     Clinical Impression Statement Patient has attended 10 physical therapy sessions this episode of care and has made some progress towards all goals but has not yet met any of them. She is currently participating well in HEP appropriate for current ability, has improved lumbar AROM with less pain, improved B LE strength, reduced her pain with functional activities from 10/10 to 7.5/10 less frequently, improved 6MWT to 1300 feet, and improved FOTO score from 40 to 53 (represents self-reported function). However, patient continues to have "catching" in her back that causes pain and difficulty with function and recently had an increase in pain for over a week after falling asleep in an awkward position. Her pain levels still get up to 7.5/10 and she was limited in her 6MWT by fatigue and right lower thoracic back pain. Although she has improved, she does not appear to have maximized her improvement from PT and would benefit from continuing. Patient would benefit from continued management of limiting condition by skilled physical therapist to address remaining impairments and functional limitations to work towards stated goals and return to PLOF or maximal functional independence.    Personal Factors and Comorbidities Profession;Time since onset of injury/illness/exacerbation;Comorbidity 3+;Past/Current Experience;Fitness    Comorbidities scoliosis, OSA, anxiety, chest pain, COPD, depression, GERD, migraines, neck pain, hyperlipidemia, paroxysmal atrial fibrilation, hypothyroidism,  PTSD, tobacco use    Examination-Activity Limitations Bathing;Carry;Lift;Sit;Stand;Bed Mobility;Locomotion  Level;Sleep;Bend;Dressing;Reach Overhead;Squat;Transfers;Hygiene/Grooming;Self Feeding;Stairs   Difficulty with walking, sitting, standing, carrying objects, lifting, going up and down stairs, getting in/out of the car, performing ADLs such as bathing, showering, getting dressed   Examination-Participation Restrictions Cleaning;Yard Work;Occupation;Community Activity;Laundry;Meal Prep    Stability/Clinical Decision Making Stable/Uncomplicated    Rehab Potential Fair    PT Frequency 2x / week    PT Duration 12 weeks    PT Treatment/Interventions ADLs/Self Care Home Management;Electrical Stimulation;Cryotherapy;Traction;Moist Heat;Gait training;Stair training;Functional mobility training;Balance training;Therapeutic activities;Therapeutic exercise;Neuromuscular re-education;Patient/family education;Manual techniques;Passive range of motion;Dry needling;Spinal Manipulations;Joint Manipulations;Aquatic Therapy    PT Next Visit Plan Plan to progress LE/back strengtheing as tolerated.    PT Home Exercise Plan [place] code: [email or web address].  progress as tolerated.    Consulted and Agree with Plan of Care Patient             Patient will benefit from skilled therapeutic intervention in order to improve the following deficits and impairments:  Abnormal gait, Decreased activity tolerance, Decreased endurance, Decreased range of motion, Decreased strength, Hypomobility, Impaired perceived functional ability, Impaired UE functional use, Improper body mechanics, Pain, Obesity, Postural dysfunction, Impaired flexibility, Decreased mobility, Difficulty walking  Visit Diagnosis: Pain in thoracic spine  Acute bilateral low back pain, unspecified whether sciatica  present  Difficulty in walking, not elsewhere classified  Muscle weakness (generalized)  Abnormal posture     Problem List Patient Active Problem List   Diagnosis Date Noted   Advance directive discussed with patient 01/30/2021   Scoliosis  08/02/2015   Chronic fatigue 02/06/2015   CKD (chronic kidney disease) stage 3, GFR 30-59 ml/min (HCC) 02/06/2015   Nasal polyp, posterior 02/03/2015   Muscle spasms of head and/or neck 11/11/2014   Neck pain of over 3 months duration 10/21/2014   GERD (gastroesophageal reflux disease)    Hypothyroidism    Migraines    Depression, major, recurrent, moderate ([facility]) 07/20/2014    Class: Chronic   OSA (obstructive sleep apnea) 02/15/2014   Atrial fibrillation ([facility]) 08/29/2012   Dyspnea on exertion 08/29/2012   DEGENERATIVE JOINT DISEASE 02/25/2008   Hyperlipidemia 01/29/2007   COPD (chronic obstructive pulmonary disease) ([facility]) 01/29/2007   Anxiety state 01/28/2007   OVARIAN CYST, LEFT 05/14/2006    Everlean Alstrom. Graylon Good, PT, DPT 02/02/21, 7:33 PM   [facility] [address], [place], [address] Phone: [phone]   Fax:  [phone]  Name: [name] MRN: [id number] Date of Birth: 1956-03-01

## 2021-02-03 ENCOUNTER — Other Ambulatory Visit: Payer: Self-pay | Admitting: Family Medicine

## 2021-02-03 MED ORDER — LEVOTHYROXINE SODIUM 125 MCG PO TABS: 125.0000 ug | ORAL_TABLET | Freq: Every day | ORAL | 3 refills | Status: DC

## 2021-02-06 ENCOUNTER — Encounter: Payer: Self-pay | Admitting: Physical Therapy

## 2021-02-06 ENCOUNTER — Other Ambulatory Visit: Payer: Self-pay

## 2021-02-06 ENCOUNTER — Ambulatory Visit: Payer: Medicare Other | Attending: Family Medicine | Admitting: Physical Therapy

## 2021-02-06 DIAGNOSIS — M546 Pain in thoracic spine: Secondary | ICD-10-CM | POA: Diagnosis not present

## 2021-02-06 DIAGNOSIS — R262 Difficulty in walking, not elsewhere classified: Secondary | ICD-10-CM | POA: Insufficient documentation

## 2021-02-06 DIAGNOSIS — M545 Low back pain, unspecified: Secondary | ICD-10-CM | POA: Insufficient documentation

## 2021-02-06 DIAGNOSIS — M6281 Muscle weakness (generalized): Secondary | ICD-10-CM | POA: Diagnosis present

## 2021-02-06 DIAGNOSIS — R293 Abnormal posture: Secondary | ICD-10-CM | POA: Insufficient documentation

## 2021-02-06 NOTE — Therapy (Signed)
[facility] [address] [place], [address] Phone: [phone]   Fax:  [phone]  Physical Therapy Treatment  Patient Details  Name: [name] MRN: [id number] Date of Birth: 10/11/1955 Referring Provider (PT): Park Liter, DO.   Encounter Date: 02/06/2021   PT End of Session - 02/06/21 1824     Visit Number 11    Number of Visits 24    Date for PT Re-Evaluation 03/22/21    Authorization Type MEDICARE PART B Reporting Period From 12/27/2020    Progress Note Due on Visit 10    PT Start Time 1820    PT Stop Time 1900    PT Time Calculation (min) 40 min    Activity Tolerance Patient tolerated treatment well    Behavior During Therapy [facility] for tasks assessed/performed             Past Medical History:  Diagnosis Date   Anxiety state, unspecified    Back pain    Chest pain    a. 2008 Cath: reportedly nl;  b. 09/2012 Lexi MV: EF 75%, soft tissue attenuation->Low risk.   COPD (chronic obstructive pulmonary disease) (HCC)    CTS (carpal tunnel syndrome)    Depression    GERD (gastroesophageal reflux disease)    Migraines    Neck pain    Nontoxic multinodular goiter    Other and unspecified hyperlipidemia    Other and unspecified ovarian cyst    left   Other chest pain    PAF (paroxysmal atrial fibrillation) ([place])    a. 07/2007 Echo: EF 65%, mildly dil LA;  b. currently on propafenone;  c. CHA2DS2VASc = 1 (gender)-->No anticoagulation.   PTSD (post-traumatic stress disorder)    Tobacco use    Tobacco use disorder    Unspecified hypothyroidism    Unspecified sleep apnea    resolved s/p UPPP surgery   Vitamin B12 deficiency     Past Surgical History:  Procedure Laterality Date   bladder tac     CARDIAC CATHETERIZATION  08/12/06   neg. Dr. Humphrey Rolls, cardio   CHOLECYSTECTOMY     FOOT SURGERY     bone removal from her foot   MRI lumber spine     03/12/06-abn   MRI pelvis  05/02/06    abn-[place], neurosurg   NASAL POLYP SURGERY     nasal ?   PARATHYROIDECTOMY     stress cardiolite  10/06/96   nl, EF 62%   stress myoview  10/25/03   Dr. Chancy Milroy, nl   THYROIDECTOMY  5/92   massive goiter   TONSILLECTOMY     TUBAL LIGATION  1988   UVULOPALATOPHARYNGOPLASTY     and tonsilectomy. [name] 10/04    There were no vitals filed for this visit.   Subjective Assessment - 02/06/21 1823     Subjective Patient reports she has 5/10 pain in the right lower thoracic spine. States her lower back has been clicking but not hurting. States she felt good after last PT session. She is feeling a lot of financial and housing stress. She has been doing her HEP. Also reports a tension headache.    Pertinent History Patient is a 65 year old female who presents to outpatient physical therapy with a referral for acute right sided low back pain without sciatica. This patient's chief complaints consist of thoracic and lumbar back pain, leading to the following functional deficits: difficulty with walking, sitting, standing, carrying  objects, lifting, going up and down stairs, getting in/out of the car, performing ADLs such as bathing, showering, getting dressed. Relevant past medical history and comorbidities include scoliosis, OSA, anxiety, chest pain, COPD, depression, GERD, migraines, neck pain, hyperlipidemia, paroxysmal atrial fibrilation, hypothyroidism,  PTSD, tobacco use, removal of thyroid and parathyroid glands. Patient denies history of dizziness, double vision, cancer, bowel and bladder changes, numbness or tingling in groin area, stroke, seizure, unexplained weight loss, and lung or major heart problems.    Limitations Sitting;Lifting;Walking;Standing;House hold activities   Difficulty with walking, sitting, standing, carrying objects, lifting, going up and down stairs, getting in/out of the car, performing ADLs such as bathing, showering, getting dressed   How long can you walk comfortably?  Unable to walk for a few blocks or into a store due to back pain    Diagnostic tests X-Ray (12/14/2020)    Patient Stated Goals Patient would like to "be more comfortable"    Currently in Pain? Yes    Pain Score 5     Pain Location Back    Pain Orientation Right    Pain Onset More than a month ago              TREATMENT:    Manual therapy: to reduce pain and tissue tension, improve range of motion, neuromodulation, in order to promote improved ability to complete functional activities. PRONE B scapular mobilization all directions and distraction   STM to right thoracic, B periscapular musculature, posterior neck and suboccipitals, to decrease pain and muscle tension including headache.    Therapeutic exercise: to centralize symptoms and improve ROM, strength, muscular endurance, and activity tolerance required for successful completion of functional activities.  - Prone press ups on plinth table, 2x10  Pt required multimodal cuing for proper technique and to facilitate improved neuromuscular control, strength, range of motion, and functional ability resulting in improved performance and form.      HOME EXERCISE PLAN: Access Code: [email or web address] URL: [email or web address] Date: 01/02/2021 Prepared by: Rosita Kea  Exercises   Sidelying Thoracic Rotation with Open Book - 1 x daily - 1 sets - 15 reps Low Trap Setting at [place] 1 x daily - 3 sets - 10 reps Standing Row with Anchored Resistance - 1 x daily - 4 sets - 10 reps    PT Education - 02/06/21 1824     Education Details exercise purpose and form.    Person(s) Educated Patient    Methods Explanation;Demonstration;Tactile cues;Verbal cues    Comprehension Verbalized understanding;Returned demonstration;Verbal cues required;Tactile cues required;Need further instruction              PT Short Term Goals - 01/10/21 1524       PT SHORT TERM GOAL #1   Title Patient will be independent with initial home  program for self-management of symptoms and to improve strength/mobility and functional independence with ADLs.    Baseline HEP to be provided at 2nd session    Time 3    Period Weeks    Status Achieved    Target Date 01/18/21               PT Long Term Goals - 02/02/21 1620       PT LONG TERM GOAL #1   Title Patient will be independent with long term home program for self-management of symptoms and to improve strength/mobility and functional independence with ADLs.    Baseline Patient to be provided with initial HEP at next session (12/27/2020); patient  participating well on appropriate HEP for current level of recovery (02/02/2021);    Time 12    Period Weeks    Status Partially Met   TARGET DATE FOR ALL LONG TERM GOALS:  03/22/2021     PT LONG TERM GOAL #2   Title Demonstrate improved FOTO score to 56 or greater to demonstrate improvement in overall condition and self-reported functional ability.    Baseline 40 (12/27/2020); 44 (01/19/2021): 53 (02/02/2021);    Time 12    Period Weeks    Status Partially Met      PT LONG TERM GOAL #3   Title Reduce pain with functional activities to equal or less than 3/10 to allow patient to complete usual activities including ADLs, IADLs, reacreational activities, social engagements, and usual household work activities with less difficulty.    Baseline W: 10/10. (12/27/2020); 7.5/10 less often high than before (02/02/2021);    Time 12    Period Weeks    Status Partially Met      PT LONG TERM GOAL #4   Title Patient will have full lumbar AROM with no compensations or increase in pain in all planes except intermittent end range discomfort to allow patient to complete valued activities with less difficulty.    Baseline Limited lumbar AROM with reporduction of concordant pain in all planes (12/27/2020): improved but still has some burning pain on R lower thoracic spine (02/02/2021);    Time 12    Period Weeks    Status Partially Met      PT LONG  TERM GOAL #5   Title Patient will improve LE strength to 5/5 for improved general mobility, strength, and function.    Baseline 3/5 strength in bilateral hips - limited by pain (12/27/2020); lowest rating 4-/5 now (02/02/2021);    Time 12    Period Weeks    Status Partially Met      PT LONG TERM GOAL #6   Title Patient will be able to ambulate 1500 feet in 6 minutes in order to demonstrate improvements in activity tolerance and community ambulation.    Baseline Patient reports difficulty walking a few blocks - 6 minute walk test to be measured at subsequent session (12/27/2020); 1300 feet pain up to 6/10 burning at right lower thoracic spine. Discontinued at 5 minutes per patient preference due to shortness of breath and pain. (02/02/2021)    Time 12    Period Weeks    Status Partially Met                   Plan - 02/07/21 1702     Clinical Impression Statement Patient tolerated treatment well and reported significant decrease in pain by end of session. Patient fatigued and stressed this evening and was hesitant to do more exercise after finally feeling better following manual. Plan to focus more on exercise next session as appropriate and reinforce need for active role in care. Patient would benefit from continued management of limiting condition by skilled physical therapist to address remaining impairments and functional limitations to work towards stated goals and return to PLOF or maximal functional independence.    Personal Factors and Comorbidities Profession;Time since onset of injury/illness/exacerbation;Comorbidity 3+;Past/Current Experience;Fitness    Comorbidities scoliosis, OSA, anxiety, chest pain, COPD, depression, GERD, migraines, neck pain, hyperlipidemia, paroxysmal atrial fibrilation, hypothyroidism,  PTSD, tobacco use    Examination-Activity Limitations Bathing;Carry;Lift;Sit;Stand;Bed Mobility;Locomotion Level;Sleep;Bend;Dressing;Reach  Overhead;Squat;Transfers;Hygiene/Grooming;Self Feeding;Stairs   Difficulty with walking, sitting, standing, carrying objects, lifting, going up and down stairs,  getting in/out of the car, performing ADLs such as bathing, showering, getting dressed   Examination-Participation Restrictions Cleaning;Yard Work;Occupation;Community Activity;Laundry;Meal Prep    Stability/Clinical Decision Making Stable/Uncomplicated    Rehab Potential Fair    PT Frequency 2x / week    PT Duration 12 weeks    PT Treatment/Interventions ADLs/Self Care Home Management;Electrical Stimulation;Cryotherapy;Traction;Moist Heat;Gait training;Stair training;Functional mobility training;Balance training;Therapeutic activities;Therapeutic exercise;Neuromuscular re-education;Patient/family education;Manual techniques;Passive range of motion;Dry needling;Spinal Manipulations;Joint Manipulations;Aquatic Therapy    PT Next Visit Plan Plan to progress LE/back strengthening as tolerated.    PT Home Exercise Plan [place] code: [email or web address].  progress as tolerated.    Consulted and Agree with Plan of Care Patient             Patient will benefit from skilled therapeutic intervention in order to improve the following deficits and impairments:  Abnormal gait, Decreased activity tolerance, Decreased endurance, Decreased range of motion, Decreased strength, Hypomobility, Impaired perceived functional ability, Impaired UE functional use, Improper body mechanics, Pain, Obesity, Postural dysfunction, Impaired flexibility, Decreased mobility, Difficulty walking  Visit Diagnosis: Pain in thoracic spine  Acute bilateral low back pain, unspecified whether sciatica present  Difficulty in walking, not elsewhere classified  Muscle weakness (generalized)  Abnormal posture     Problem List Patient Active Problem List   Diagnosis Date Noted   Advance directive discussed with patient 01/30/2021   Scoliosis 08/02/2015   Chronic fatigue  02/06/2015   CKD (chronic kidney disease) stage 3, GFR 30-59 ml/min (HCC) 02/06/2015   Nasal polyp, posterior 02/03/2015   Muscle spasms of head and/or neck 11/11/2014   Neck pain of over 3 months duration 10/21/2014   GERD (gastroesophageal reflux disease)    Hypothyroidism    Migraines    Depression, major, recurrent, moderate (HCC) 07/20/2014    Class: Chronic   OSA (obstructive sleep apnea) 02/15/2014   Atrial fibrillation ([place]) 08/29/2012   Dyspnea on exertion 08/29/2012   DEGENERATIVE JOINT DISEASE 02/25/2008   Hyperlipidemia 01/29/2007   COPD (chronic obstructive pulmonary disease) ([place]) 01/29/2007   Anxiety state 01/28/2007   OVARIAN CYST, LEFT 05/14/2006   Everlean Alstrom. Graylon Good, PT, DPT 02/07/21, 5:03 PM   [facility] [address], [place], [address] Phone: [phone]   Fax:  [phone]  Name: LYRIK DOCKSTADER MRN: [id number] Date of Birth: June 27, 1955

## 2021-02-08 ENCOUNTER — Other Ambulatory Visit: Payer: Self-pay

## 2021-02-08 ENCOUNTER — Encounter: Payer: Self-pay | Admitting: Physical Therapy

## 2021-02-08 ENCOUNTER — Ambulatory Visit: Payer: Medicare Other | Admitting: Physical Therapy

## 2021-02-08 DIAGNOSIS — M546 Pain in thoracic spine: Secondary | ICD-10-CM

## 2021-02-08 DIAGNOSIS — M6281 Muscle weakness (generalized): Secondary | ICD-10-CM

## 2021-02-08 DIAGNOSIS — M545 Low back pain, unspecified: Secondary | ICD-10-CM

## 2021-02-08 DIAGNOSIS — R293 Abnormal posture: Secondary | ICD-10-CM

## 2021-02-08 DIAGNOSIS — R262 Difficulty in walking, not elsewhere classified: Secondary | ICD-10-CM

## 2021-02-08 NOTE — Therapy (Signed)
[facility] [address] [place], [address] Phone: [phone]   Fax:  [phone]  Physical Therapy Treatment  Patient Details  Name: [name] MRN: [id number] Date of Birth: 1956-01-28 Referring Provider (PT): Park Liter, DO.   Encounter Date: 02/08/2021   PT End of Session - 02/08/21 1825     Visit Number 12    Number of Visits 24    Date for PT Re-Evaluation 03/22/21    Authorization Type MEDICARE PART B Reporting Period From 12/27/2020    Progress Note Due on Visit 10    PT Start Time 1824    PT Stop Time 1802    PT Time Calculation (min) 1418 min    Activity Tolerance Patient tolerated treatment well    Behavior During Therapy [facility] for tasks assessed/performed             Past Medical History:  Diagnosis Date   Anxiety state, unspecified    Back pain    Chest pain    a. 2008 Cath: reportedly nl;  b. 09/2012 Lexi MV: EF 75%, soft tissue attenuation->Low risk.   COPD (chronic obstructive pulmonary disease) (HCC)    CTS (carpal tunnel syndrome)    Depression    GERD (gastroesophageal reflux disease)    Migraines    Neck pain    Nontoxic multinodular goiter    Other and unspecified hyperlipidemia    Other and unspecified ovarian cyst    left   Other chest pain    PAF (paroxysmal atrial fibrillation) ([place])    a. 07/2007 Echo: EF 65%, mildly dil LA;  b. currently on propafenone;  c. CHA2DS2VASc = 1 (gender)-->No anticoagulation.   PTSD (post-traumatic stress disorder)    Tobacco use    Tobacco use disorder    Unspecified hypothyroidism    Unspecified sleep apnea    resolved s/p UPPP surgery   Vitamin B12 deficiency     Past Surgical History:  Procedure Laterality Date   bladder tac     CARDIAC CATHETERIZATION  08/12/06   neg. Dr. Humphrey Rolls, cardio   CHOLECYSTECTOMY     FOOT SURGERY     bone removal from her foot   MRI lumber spine     03/12/06-abn   MRI pelvis  05/02/06    abn-[place], neurosurg   NASAL POLYP SURGERY     nasal ?   PARATHYROIDECTOMY     stress cardiolite  10/06/96   nl, EF 62%   stress myoview  10/25/03   Dr. Chancy Milroy, nl   THYROIDECTOMY  5/92   massive goiter   TONSILLECTOMY     TUBAL LIGATION  1988   UVULOPALATOPHARYNGOPLASTY     and tonsilectomy. [name] 10/04    There were no vitals filed for this visit.   Subjective Assessment - 02/08/21 1824     Subjective Patient reports her back is doing well but she is having a hard time emotionally. She did not sleep well last night and has decided she needs to find a new place to live. Reports her current pain number is 5/10 in the right lower thoracic region. The low back has not given her any issues since last PT session. Has been doing prone press up at home. Patient has financial concerns about continuing PT but is reluctant to discontinue PT.    Pertinent History Patient is a 65 year old female who presents to outpatient physical therapy with a referral for acute  right sided low back pain without sciatica. This patient's chief complaints consist of thoracic and lumbar back pain, leading to the following functional deficits: difficulty with walking, sitting, standing, carrying objects, lifting, going up and down stairs, getting in/out of the car, performing ADLs such as bathing, showering, getting dressed. Relevant past medical history and comorbidities include scoliosis, OSA, anxiety, chest pain, COPD, depression, GERD, migraines, neck pain, hyperlipidemia, paroxysmal atrial fibrilation, hypothyroidism,  PTSD, tobacco use, removal of thyroid and parathyroid glands. Patient denies history of dizziness, double vision, cancer, bowel and bladder changes, numbness or tingling in groin area, stroke, seizure, unexplained weight loss, and lung or major heart problems.    Limitations Sitting;Lifting;Walking;Standing;House hold activities   Difficulty with walking, sitting, standing, carrying objects,  lifting, going up and down stairs, getting in/out of the car, performing ADLs such as bathing, showering, getting dressed   How long can you walk comfortably? Unable to walk for a few blocks or into a store due to back pain    Diagnostic tests X-Ray (12/14/2020)    Patient Stated Goals Patient would like to "be more comfortable"    Currently in Pain? Yes    Pain Score 5     Pain Onset More than a month ago             TREATMENT:    Manual therapy: to reduce pain and tissue tension, improve range of motion, neuromodulation, in order to promote improved ability to complete functional activities. PRONE B scapular mobilization all directions and distraction   STM to right thoracic, B periscapular musculature    Therapeutic exercise: to centralize symptoms and improve ROM, strength, muscular endurance, and activity tolerance required for successful completion of functional activities.  - Prone press ups on plinth table, 2x10 - standing scapular rows, 3x10 with 15# cable  - seated lat pull down with downwards shrug, 3x10 at 35# - standing pallof press with 5# cable, 2x10   Pt required multimodal cuing for proper technique and to facilitate improved neuromuscular control, strength, range of motion, and functional ability resulting in improved performance and form.      HOME EXERCISE PLAN: Access Code: [email or web address] URL: [email or web address] Date: 01/02/2021 Prepared by: Rosita Kea  Exercises   Sidelying Thoracic Rotation with Open Book - 1 x daily - 1 sets - 15 reps Low Trap Setting at [place] 1 x daily - 3 sets - 10 reps Standing Row with Anchored Resistance - 1 x daily - 4 sets - 10 reps   PT Education - 02/08/21 1825     Education Details exercise purpose and form.    Person(s) Educated Patient    Methods Explanation;Demonstration;Tactile cues;Verbal cues    Comprehension Verbalized understanding;Returned demonstration;Verbal cues required;Tactile cues  required;Need further instruction              PT Short Term Goals - 01/10/21 1524       PT SHORT TERM GOAL #1   Title Patient will be independent with initial home program for self-management of symptoms and to improve strength/mobility and functional independence with ADLs.    Baseline HEP to be provided at 2nd session    Time 3    Period Weeks    Status Achieved    Target Date 01/18/21               PT Long Term Goals - 02/02/21 1620       PT LONG TERM GOAL #1   Title Patient will be independent with  long term home program for self-management of symptoms and to improve strength/mobility and functional independence with ADLs.    Baseline Patient to be provided with initial HEP at next session (12/27/2020); patient participating well on appropriate HEP for current level of recovery (02/02/2021);    Time 12    Period Weeks    Status Partially Met   TARGET DATE FOR ALL LONG TERM GOALS:  03/22/2021     PT LONG TERM GOAL #2   Title Demonstrate improved FOTO score to 56 or greater to demonstrate improvement in overall condition and self-reported functional ability.    Baseline 40 (12/27/2020); 44 (01/19/2021): 53 (02/02/2021);    Time 12    Period Weeks    Status Partially Met      PT LONG TERM GOAL #3   Title Reduce pain with functional activities to equal or less than 3/10 to allow patient to complete usual activities including ADLs, IADLs, reacreational activities, social engagements, and usual household work activities with less difficulty.    Baseline W: 10/10. (12/27/2020); 7.5/10 less often high than before (02/02/2021);    Time 12    Period Weeks    Status Partially Met      PT LONG TERM GOAL #4   Title Patient will have full lumbar AROM with no compensations or increase in pain in all planes except intermittent end range discomfort to allow patient to complete valued activities with less difficulty.    Baseline Limited lumbar AROM with reporduction of concordant pain  in all planes (12/27/2020): improved but still has some burning pain on R lower thoracic spine (02/02/2021);    Time 12    Period Weeks    Status Partially Met      PT LONG TERM GOAL #5   Title Patient will improve LE strength to 5/5 for improved general mobility, strength, and function.    Baseline 3/5 strength in bilateral hips - limited by pain (12/27/2020); lowest rating 4-/5 now (02/02/2021);    Time 12    Period Weeks    Status Partially Met      PT LONG TERM GOAL #6   Title Patient will be able to ambulate 1500 feet in 6 minutes in order to demonstrate improvements in activity tolerance and community ambulation.    Baseline Patient reports difficulty walking a few blocks - 6 minute walk test to be measured at subsequent session (12/27/2020); 1300 feet pain up to 6/10 burning at right lower thoracic spine. Discontinued at 5 minutes per patient preference due to shortness of breath and pain. (02/02/2021)    Time 12    Period Weeks    Status Partially Met                   Plan - 02/08/21 2002     Clinical Impression Statement Patient tolerated treatment well with no increase in pain or complaints of pain during session. Patient was able to voluntarily increase gait speed on TM and use less UE support during running man. Continued working on LE strength and stability. Plan to progress as tolerated but did not progress significantly today due to soreness in the joint following last PT session. Patient would benefit from continued management of limiting condition by skilled physical therapist to address remaining impairments and functional limitations to work towards stated goals and return to PLOF or maximal functional independence.    Personal Factors and Comorbidities Profession;Time since onset of injury/illness/exacerbation;Comorbidity 3+;Past/Current Experience;Fitness    Comorbidities scoliosis, OSA, anxiety,  chest pain, COPD, depression, GERD, migraines, neck pain,  hyperlipidemia, paroxysmal atrial fibrilation, hypothyroidism,  PTSD, tobacco use    Examination-Activity Limitations Bathing;Carry;Lift;Sit;Stand;Bed Mobility;Locomotion Level;Sleep;Bend;Dressing;Reach Overhead;Squat;Transfers;Hygiene/Grooming;Self Feeding;Stairs   Difficulty with walking, sitting, standing, carrying objects, lifting, going up and down stairs, getting in/out of the car, performing ADLs such as bathing, showering, getting dressed   Examination-Participation Restrictions Cleaning;Yard Work;Occupation;Community Activity;Laundry;Meal Prep    Stability/Clinical Decision Making Stable/Uncomplicated    Rehab Potential Fair    PT Frequency 2x / week    PT Duration 12 weeks    PT Treatment/Interventions ADLs/Self Care Home Management;Electrical Stimulation;Cryotherapy;Traction;Moist Heat;Gait training;Stair training;Functional mobility training;Balance training;Therapeutic activities;Therapeutic exercise;Neuromuscular re-education;Patient/family education;Manual techniques;Passive range of motion;Dry needling;Spinal Manipulations;Joint Manipulations;Aquatic Therapy    PT Next Visit Plan Plan to progress LE/back strengthening as tolerated.    PT Home Exercise Plan [place] code: [email or web address].  progress as tolerated.    Consulted and Agree with Plan of Care Patient             Patient will benefit from skilled therapeutic intervention in order to improve the following deficits and impairments:  Abnormal gait, Decreased activity tolerance, Decreased endurance, Decreased range of motion, Decreased strength, Hypomobility, Impaired perceived functional ability, Impaired UE functional use, Improper body mechanics, Pain, Obesity, Postural dysfunction, Impaired flexibility, Decreased mobility, Difficulty walking  Visit Diagnosis: Pain in thoracic spine  Acute bilateral low back pain, unspecified whether sciatica present  Difficulty in walking, not elsewhere classified  Muscle weakness  (generalized)  Abnormal posture     Problem List Patient Active Problem List   Diagnosis Date Noted   Advance directive discussed with patient 01/30/2021   Scoliosis 08/02/2015   Chronic fatigue 02/06/2015   CKD (chronic kidney disease) stage 3, GFR 30-59 ml/min (HCC) 02/06/2015   Nasal polyp, posterior 02/03/2015   Muscle spasms of head and/or neck 11/11/2014   Neck pain of over 3 months duration 10/21/2014   GERD (gastroesophageal reflux disease)    Hypothyroidism    Migraines    Depression, major, recurrent, moderate (HCC) 07/20/2014    Class: Chronic   OSA (obstructive sleep apnea) 02/15/2014   Atrial fibrillation ([place]) 08/29/2012   Dyspnea on exertion 08/29/2012   DEGENERATIVE JOINT DISEASE 02/25/2008   Hyperlipidemia 01/29/2007   COPD (chronic obstructive pulmonary disease) ([place]) 01/29/2007   Anxiety state 01/28/2007   OVARIAN CYST, LEFT 05/14/2006    Everlean Alstrom. Graylon Good, PT, DPT 02/08/21, 8:02 PM   [facility] [address] [place], [address] Phone: [phone]   Fax:  [phone]  Name: [name] MRN: [id number] Date of Birth: January 23, 1956

## 2021-02-15 ENCOUNTER — Ambulatory Visit: Payer: Medicare Other | Admitting: Physical Therapy

## 2021-02-20 ENCOUNTER — Ambulatory Visit: Payer: Medicare Other | Admitting: Physical Therapy

## 2021-02-20 ENCOUNTER — Telehealth: Payer: Self-pay | Admitting: Physical Therapy

## 2021-02-20 NOTE — Telephone Encounter (Signed)
Called patient when she did not come to her appointment today scheduled for 4:45pm. Patient answered and said she had forgotten it and has been feeling out of it since she has pneumonia. State she had a negative COVID test at home. States she does not think she will feel well enough to come this Wednesday so requested to cancel that appointment. Conformed next appointment scheduled is at 6:15pm on Monday 02/27/2021. States she will contact us if her ability to come changes.   Luretha Murphy. Ilsa Iha, PT, DPT 02/20/21, 5:28 PM

## 2021-02-22 ENCOUNTER — Ambulatory Visit: Payer: Medicare Other | Admitting: Physical Therapy

## 2021-02-27 ENCOUNTER — Ambulatory Visit: Payer: Medicare Other | Admitting: Physical Therapy

## 2021-02-27 ENCOUNTER — Ambulatory Visit: Payer: Self-pay

## 2021-02-27 LAB — COLOGUARD

## 2021-02-27 NOTE — Telephone Encounter (Signed)
The patient has experienced cough and congestion for roughly two weeks   The patient would like to be prescribed Tessalon Perles to help with discomfort   Please contact further to discuss   Pt. Reports she started having a dry "hacking cough 2 weeks ago." Non-productive. No other symptoms. OTC not helping . Has 2 negative COVID 19. States she cannot afford an office visit at this time. Requesting Tessalon Perles be sent to her pharmacy. Please advise pt.   Answer Assessment - Initial Assessment Questions 1. ONSET: "When did the cough begin?"      2 weeks 2. SEVERITY: "How bad is the cough today?"      Severe 3. SPUTUM: "Describe the color of your sputum" (none, dry cough; clear, white, yellow, green)     None 4. HEMOPTYSIS: "Are you coughing up any blood?" If so ask: "How much?" (flecks, streaks, tablespoons, etc.)     No 5. DIFFICULTY BREATHING: "Are you having difficulty breathing?" If Yes, ask: "How bad is it?" (e.g., mild, moderate, severe)    - MILD: No SOB at rest, mild SOB with walking, speaks normally in sentences, can lie down, no retractions, pulse < 100.    - MODERATE: SOB at rest, SOB with minimal exertion and prefers to sit, cannot lie down flat, speaks in phrases, mild retractions, audible wheezing, pulse 100-120.    - SEVERE: Very SOB at rest, speaks in single words, struggling to breathe, sitting hunched forward, retractions, pulse > 120      Mild 6. FEVER: "Do you have a fever?" If Yes, ask: "What is your temperature, how was it measured, and when did it start?"     None today 7. CARDIAC HISTORY: "Do you have any history of heart disease?" (e.g., heart attack, congestive heart failure)      Afib 8. LUNG HISTORY: "Do you have any history of lung disease?"  (e.g., pulmonary embolus, asthma, emphysema)     COPD 9. PE RISK FACTORS: "Do you have a history of blood clots?" (or: recent major surgery, recent prolonged travel, bedridden)     No 10. OTHER SYMPTOMS: "Do you have  any other symptoms?" (e.g., runny nose, wheezing, chest pain)       No 11. PREGNANCY: "Is there any chance you are pregnant?" "When was your last menstrual period?"       No 12. TRAVEL: "Have you traveled out of the country in the last month?" (e.g., travel history, exposures)       No  Protocols used: Cough - Acute Non-Productive-A-AH

## 2021-02-27 NOTE — Telephone Encounter (Signed)
Please see below and advise.  Patient has requested medication and states she can not afford an office visit at this time.  Thank you.

## 2021-02-28 MED ORDER — BENZONATATE 200 MG PO CAPS: 200.0000 mg | ORAL_CAPSULE | Freq: Two times a day (BID) | ORAL | 0 refills | Status: DC | PRN

## 2021-02-28 NOTE — Telephone Encounter (Signed)
Patient notified, did not want to schedule at this time.

## 2021-02-28 NOTE — Telephone Encounter (Signed)
If not better, will need appointment.

## 2021-03-01 ENCOUNTER — Ambulatory Visit: Payer: Medicare Other | Admitting: Physical Therapy

## 2021-03-05 ENCOUNTER — Encounter: Payer: Self-pay | Admitting: Family Medicine

## 2021-03-06 ENCOUNTER — Encounter: Payer: 59 | Admitting: Physical Therapy

## 2021-03-09 ENCOUNTER — Encounter: Payer: 59 | Admitting: Physical Therapy

## 2021-03-13 ENCOUNTER — Encounter: Payer: 59 | Admitting: Physical Therapy

## 2021-03-15 ENCOUNTER — Encounter: Payer: 59 | Admitting: Physical Therapy

## 2021-03-20 ENCOUNTER — Encounter: Payer: 59 | Admitting: Physical Therapy

## 2021-03-22 ENCOUNTER — Encounter: Payer: 59 | Admitting: Physical Therapy

## 2021-03-27 ENCOUNTER — Encounter: Payer: 59 | Admitting: Physical Therapy

## 2021-03-29 ENCOUNTER — Encounter: Payer: 59 | Admitting: Physical Therapy

## 2021-04-03 ENCOUNTER — Encounter: Payer: 59 | Admitting: Physical Therapy

## 2021-04-05 ENCOUNTER — Encounter: Payer: 59 | Admitting: Physical Therapy

## 2021-04-10 ENCOUNTER — Encounter: Payer: 59 | Admitting: Physical Therapy

## 2021-04-12 ENCOUNTER — Encounter: Payer: 59 | Admitting: Physical Therapy

## 2021-04-13 ENCOUNTER — Telehealth: Payer: Self-pay | Admitting: Family Medicine

## 2021-04-13 NOTE — Telephone Encounter (Signed)
Christine from [employer] called in to see if request for evauation of cpap supplies was received, faxed 12/05.

## 2021-04-13 NOTE — Telephone Encounter (Signed)
Request has come through and was faxed back yesterday by [place].

## 2021-04-17 ENCOUNTER — Encounter: Payer: 59 | Admitting: Physical Therapy

## 2021-04-19 ENCOUNTER — Encounter: Payer: 59 | Admitting: Physical Therapy

## 2021-04-21 ENCOUNTER — Telehealth: Payer: Self-pay | Admitting: Family Medicine

## 2021-04-21 NOTE — Telephone Encounter (Signed)
Provider instructed me to call pt to schedule an appt to discuss sleep study/CPAP. Scheduled pt the appt and pt asks for Dr. Laural Benes to give her a call.

## 2021-04-21 NOTE — Telephone Encounter (Signed)
Please find out what patient needs. Thanks.

## 2021-04-21 NOTE — Telephone Encounter (Signed)
She did have a visit in August. Does not need appt. Needs note from August sent.

## 2021-04-21 NOTE — Telephone Encounter (Signed)
Spoke with patient and she says that she is being told that she can't received the rest of CPAP supplies as they have not received the notes stating that it wasn't documented back in her visit in August. Patient states she did not understand why she needed another appointment regarding her CPAP supplies. Please advise?

## 2021-04-21 NOTE — Telephone Encounter (Signed)
Spoke with Chrissie Noa from [employer] to gather information on patient CPAP supplies. Was informed that patient has a balance with their company and that is the reason patient has not received her supplies. Chrissie Noa advised patient to give them a call to discuss a payment plan in order to get her supplies. Patient was notified and states she was not told about that in the e-mail she received from [employer]. Patient was provided with their contact information per Chrissie Noa. Patient verbalized understanding and has no further questions at this time.

## 2021-04-24 ENCOUNTER — Encounter: Payer: 59 | Admitting: Physical Therapy

## 2021-04-26 ENCOUNTER — Encounter: Payer: 59 | Admitting: Physical Therapy

## 2021-05-03 ENCOUNTER — Encounter: Payer: 59 | Admitting: Physical Therapy

## 2021-06-01 ENCOUNTER — Ambulatory Visit: Payer: 59 | Admitting: Internal Medicine

## 2021-06-01 ENCOUNTER — Ambulatory Visit: Payer: 59 | Admitting: Family Medicine

## 2021-06-08 ENCOUNTER — Ambulatory Visit: Payer: Medicare Other | Admitting: Internal Medicine

## 2021-08-01 ENCOUNTER — Ambulatory Visit: Payer: 59 | Admitting: Family Medicine

## 2021-08-07 ENCOUNTER — Ambulatory Visit: Payer: Medicare Other | Admitting: Internal Medicine

## 2021-08-10 ENCOUNTER — Telehealth (INDEPENDENT_AMBULATORY_CARE_PROVIDER_SITE_OTHER): Payer: Medicare Other | Admitting: Family Medicine

## 2021-08-10 ENCOUNTER — Encounter: Payer: Self-pay | Admitting: Family Medicine

## 2021-08-10 DIAGNOSIS — Z720 Tobacco use: Secondary | ICD-10-CM | POA: Diagnosis not present

## 2021-08-10 NOTE — Progress Notes (Signed)
? ?There were no vitals taken for this visit.  ? ?Subjective:  ? ? [name], female    DOB: 27-Jul-1955, 66 y.o.   MRN: [id number] ? ?HPI: ?[name] is a 66 y.o. female ? ?Chief Complaint  ?Patient presents with  ? Nicotine Dependence  ?  Pt states she needs to discuss smoking cessation with the provider for her insurance  ? ?SMOKING CESSATION ?Smoking Status: not every day smoker ?Smoking Amount: 1/4-1/2ppd ?Smoking Onset: 17-18yo ?Smoking Quit Date: not set ?Smoking triggers: stress  ?Type of tobacco use: cigarettes ?Children in the house: no ?Other household members who smoke: no ?Treatments attempted: cold [place], wellbutrin, patches ?Pneumovax: up to date ? ? ?Relevant past medical, surgical, family and social history reviewed and updated as indicated. Interim medical history since our last visit reviewed. ?Allergies and medications reviewed and updated. ? ?Review of Systems  ?Constitutional: Negative.   ?Respiratory: Negative.    ?Cardiovascular: Negative.   ?Gastrointestinal: Negative.   ?Musculoskeletal: Negative.   ?Psychiatric/Behavioral: Negative.    ? ?Per HPI unless specifically indicated above ? ?   ?Objective:  ?  ?There were no vitals taken for this visit.  ?Wt Readings from Last 3 Encounters:  ?01/30/21 210 lb (95.3 kg)  ?12/13/20 208 lb 6.4 oz (94.5 kg)  ?11/02/20 214 lb 3.2 oz (97.2 kg)  ?  ?Physical Exam ?Vitals and nursing note reviewed.  ?Constitutional:   ?   General: She is not in acute distress. ?   Appearance: Normal appearance. She is not ill-appearing, toxic-appearing or diaphoretic.  ?HENT:  ?   Head: Normocephalic and atraumatic.  ?   Right Ear: External ear normal.  ?   Left Ear: External ear normal.  ?   Nose: Nose normal.  ?   Mouth/Throat:  ?   Mouth: Mucous membranes are moist.  ?   Pharynx: Oropharynx is clear.  ?Eyes:  ?   General: No scleral icterus.    ?   Right eye: No discharge.     ?   Left eye: No discharge.  ?   Conjunctiva/sclera: Conjunctivae  normal.  ?   Pupils: Pupils are equal, round, and reactive to light.  ?Pulmonary:  ?   Effort: Pulmonary effort is normal. No respiratory distress.  ?   Comments: Speaking in full sentences ?Musculoskeletal:     ?   General: Normal range of motion.  ?   Cervical back: Normal range of motion.  ?Skin: ?   Coloration: Skin is not jaundiced or pale.  ?   Findings: No bruising, erythema, lesion or rash.  ?Neurological:  ?   Mental Status: She is alert and oriented to person, place, and time. Mental status is at baseline.  ?Psychiatric:     ?   Mood and Affect: Mood normal.     ?   Behavior: Behavior normal.     ?   Thought Content: Thought content normal.     ?   Judgment: Judgment normal.  ? ? ?Results for orders placed or performed in visit on 01/30/21  ?Microscopic Examination  ? BLD  ?Result Value Ref Range  ? WBC, UA 0-5 0 - 5 /hpf  ? RBC None seen 0 - 2 /hpf  ? Epithelial Cells (non renal) 0-10 0 - 10 /hpf  ? Mucus, UA Present (A) Not Estab.  ? Bacteria, UA Few (A) None seen/Few  ?Bayer DCA Hb A1c Waived  ?Result Value Ref Range  ? HB  A1C (BAYER DCA - WAIVED) 5.5 4.8 - 5.6 %  ?CBC with Differential/Platelet  ?Result Value Ref Range  ? WBC 9.1 3.4 - 10.8 x10E3/uL  ? RBC 4.92 3.77 - 5.28 x10E6/uL  ? Hemoglobin 15.4 11.1 - 15.9 g/dL  ? Hematocrit 46.0 34.0 - 46.6 %  ? MCV 94 79 - 97 fL  ? MCH 31.3 26.6 - 33.0 pg  ? MCHC 33.5 31.5 - 35.7 g/dL  ? RDW 11.9 11.7 - 15.4 %  ? Platelets 258 150 - 450 x10E3/uL  ? Neutrophils 49 Not Estab. %  ? Lymphs 42 Not Estab. %  ? Monocytes 7 Not Estab. %  ? Eos 1 Not Estab. %  ? Basos 1 Not Estab. %  ? Neutrophils Absolute 4.5 1.4 - 7.0 x10E3/uL  ? Lymphocytes Absolute 3.8 (H) 0.7 - 3.1 x10E3/uL  ? Monocytes Absolute 0.6 0.1 - 0.9 x10E3/uL  ? EOS (ABSOLUTE) 0.1 0.0 - 0.4 x10E3/uL  ? Basophils Absolute 0.1 0.0 - 0.2 x10E3/uL  ? Immature Granulocytes 0 Not Estab. %  ? Immature Grans (Abs) 0.0 0.0 - 0.1 x10E3/uL  ?Comprehensive metabolic panel  ?Result Value Ref Range  ? Glucose 75 70 - 99  mg/dL  ? BUN 9 8 - 27 mg/dL  ? Creatinine, Ser 0.81 0.57 - 1.00 mg/dL  ? eGFR 81 >59 mL/min/1.73  ? BUN/Creatinine Ratio 11 (L) 12 - 28  ? Sodium 143 134 - 144 mmol/L  ? Potassium 4.4 3.5 - 5.2 mmol/L  ? Chloride 103 96 - 106 mmol/L  ? CO2 25 20 - 29 mmol/L  ? Calcium 9.1 8.7 - 10.3 mg/dL  ? Total Protein 6.6 6.0 - 8.5 g/dL  ? Albumin 4.5 3.8 - 4.8 g/dL  ? Globulin, Total 2.1 1.5 - 4.5 g/dL  ? Albumin/Globulin Ratio 2.1 1.2 - 2.2  ? Bilirubin Total 0.2 0.0 - 1.2 mg/dL  ? Alkaline Phosphatase 86 44 - 121 IU/L  ? AST 17 0 - 40 IU/L  ? ALT 11 0 - 32 IU/L  ?Lipid Panel w/o Chol/HDL Ratio  ?Result Value Ref Range  ? Cholesterol, Total 149 100 - 199 mg/dL  ? Triglycerides 163 (H) 0 - 149 mg/dL  ? HDL 39 (L) >39 mg/dL  ? VLDL Cholesterol Cal 28 5 - 40 mg/dL  ? LDL Chol Calc (NIH) 82 0 - 99 mg/dL  ?Urinalysis, Routine w reflex microscopic  ?Result Value Ref Range  ? Specific Gravity, UA 1.020 1.005 - 1.030  ? pH, UA 5.5 5.0 - 7.5  ? Color, UA Yellow Yellow  ? Appearance Ur Clear Clear  ? Leukocytes,UA Trace (A) Negative  ? Protein,UA Negative Negative/Trace  ? Glucose, UA Negative Negative  ? Ketones, UA Negative Negative  ? RBC, UA Negative Negative  ? Bilirubin, UA Negative Negative  ? Urobilinogen, Ur 0.2 0.2 - 1.0 mg/dL  ? Nitrite, UA Negative Negative  ? Microscopic Examination See below:   ?TSH  ?Result Value Ref Range  ? TSH 1.880 0.450 - 4.500 uIU/mL  ?Microalbumin, Urine Waived  ?Result Value Ref Range  ? Microalb, Ur Waived 30 (H) 0 - 19 mg/L  ? Creatinine, Urine Waived 200 10 - 300 mg/dL  ? Microalb/Creat Ratio <30 <30 mg/g  ?Cologuard  ?Result Value Ref Range  ? COLOGUARD Sample Could Not Be Processed 9 Negative  ? ?   ?Assessment & Plan:  ? ?Problem List Items Addressed This Visit   ? ?  ? Other  ? Tobacco abuse - Primary  ?  Not quite ready to quit, has been under a lot of stress. We discussed triggers and mechanisms to cut back. Declines wellbutrin, chantix and nicotine replacements at this time. Call with  any concerns. Continue to monitor.  ?  ?  ?  ? ?Follow up plan: ?Return if symptoms worsen or fail to improve. ? ? ? ?This visit was completed via video visit through MyChart due to the restrictions of the COVID-19 pandemic. All issues as above were discussed and addressed. Physical exam was done as above through visual confirmation on video through MyChart. If it was felt that the patient should be evaluated in the office, they were directed there. The patient verbally consented to this visit. ?Location of the patient: home ?Location of the provider: work ?Those involved with this call:  ?Provider: Park Liter, DO ?CMA: Louanna Raw, [place] ?Front Desk/Registration: [employer]  ?Time spent on call:  15 minutes with patient face to face via video conference. More than 50% of this time was spent in counseling and coordination of care. 23 minutes total spent in review of patient's record and preparation of their chart. ? ? ? ?

## 2021-08-10 NOTE — Assessment & Plan Note (Signed)
Not quite ready to quit, has been under a lot of stress. We discussed triggers and mechanisms to cut back. Declines wellbutrin, chantix and nicotine replacements at this time. Call with any concerns. Continue to monitor.  ?

## 2021-08-24 ENCOUNTER — Ambulatory Visit: Payer: Self-pay | Admitting: Family Medicine

## 2021-09-22 ENCOUNTER — Ambulatory Visit (INDEPENDENT_AMBULATORY_CARE_PROVIDER_SITE_OTHER): Payer: Medicare Other | Admitting: Family Medicine

## 2021-09-22 ENCOUNTER — Other Ambulatory Visit (HOSPITAL_COMMUNITY)
Admission: RE | Admit: 2021-09-22 | Discharge: 2021-09-22 | Disposition: A | Discharge status: Home / Self Care | Payer: BC Managed Care – PPO | Source: Ambulatory Visit | Attending: Family Medicine | Admitting: Family Medicine

## 2021-09-22 ENCOUNTER — Encounter: Payer: Self-pay | Admitting: Family Medicine

## 2021-09-22 VITALS — BP 100/66 | HR 80 | Temp 98.0°F | Wt 204.0 lb

## 2021-09-22 DIAGNOSIS — Z1283 Encounter for screening for malignant neoplasm of skin: Secondary | ICD-10-CM

## 2021-09-22 DIAGNOSIS — E782 Mixed hyperlipidemia: Secondary | ICD-10-CM | POA: Diagnosis not present

## 2021-09-22 DIAGNOSIS — Z1151 Encounter for screening for human papillomavirus (HPV): Secondary | ICD-10-CM | POA: Diagnosis not present

## 2021-09-22 DIAGNOSIS — Z01419 Encounter for gynecological examination (general) (routine) without abnormal findings: Secondary | ICD-10-CM | POA: Insufficient documentation

## 2021-09-22 DIAGNOSIS — E039 Hypothyroidism, unspecified: Secondary | ICD-10-CM

## 2021-09-22 DIAGNOSIS — G4733 Obstructive sleep apnea (adult) (pediatric): Secondary | ICD-10-CM

## 2021-09-22 DIAGNOSIS — F331 Major depressive disorder, recurrent, moderate: Secondary | ICD-10-CM

## 2021-09-22 DIAGNOSIS — Z5900 Homelessness unspecified: Secondary | ICD-10-CM

## 2021-09-22 DIAGNOSIS — N1831 Chronic kidney disease, stage 3a: Secondary | ICD-10-CM

## 2021-09-22 DIAGNOSIS — I480 Paroxysmal atrial fibrillation: Secondary | ICD-10-CM

## 2021-09-22 DIAGNOSIS — J449 Chronic obstructive pulmonary disease, unspecified: Secondary | ICD-10-CM

## 2021-09-22 DIAGNOSIS — Z124 Encounter for screening for malignant neoplasm of cervix: Secondary | ICD-10-CM

## 2021-09-22 DIAGNOSIS — K219 Gastro-esophageal reflux disease without esophagitis: Secondary | ICD-10-CM | POA: Diagnosis not present

## 2021-09-22 MED ORDER — ATORVASTATIN CALCIUM 40 MG PO TABS: 40.0000 mg | ORAL_TABLET | Freq: Every day | ORAL | 1 refills | Status: DC

## 2021-09-22 MED ORDER — PANTOPRAZOLE SODIUM 20 MG PO TBEC: 20.0000 mg | DELAYED_RELEASE_TABLET | Freq: Every day | ORAL | 3 refills | Status: DC

## 2021-09-22 MED ORDER — CITALOPRAM HYDROBROMIDE 40 MG PO TABS: 60.0000 mg | ORAL_TABLET | Freq: Every day | ORAL | 1 refills | Status: DC

## 2021-09-22 NOTE — Assessment & Plan Note (Signed)
Stable today. Continue to follow with cardiology. Labs drawn today.

## 2021-09-22 NOTE — Assessment & Plan Note (Signed)
Rechecking labs today. Await results. Treat as needed.  °

## 2021-09-22 NOTE — Assessment & Plan Note (Signed)
Has been unable to get her CPAP due to homelessness. Cannot have sleep study for another 6 months due to coverage. Will get NCM involved to see if they can help.

## 2021-09-22 NOTE — Assessment & Plan Note (Signed)
Not doing well due to social issues. Will increase her celexa to 60mg . Call with any concerns. Continue to monitor. Will get social worker involved.

## 2021-09-22 NOTE — Assessment & Plan Note (Signed)
Under good control on current regimen. Continue current regimen. Continue to monitor. Call with any concerns. Refills given. Labs drawn today.   

## 2021-09-22 NOTE — Assessment & Plan Note (Signed)
Under good control on current regimen. Continue current regimen. Continue to monitor. Call with any concerns. Refills given.   

## 2021-09-22 NOTE — Progress Notes (Signed)
BP 100/66   Pulse 80   Temp 98 F (36.7 C)   Wt 204 lb (92.5 kg)   SpO2 97%   BMI 36.14 kg/m    Subjective:    [name], female    DOB: 06-Jun-1955, 66 y.o.   MRN: [id number]  HPI: [name] is a 66 y.o. female  Chief Complaint  Patient presents with   Hypothyroidism   Depression   Hyperlipidemia   SLEEP APNEA- has not been able to use her CPAP for months as she was homeless and living in her car. She was not able to upload her data, so they cut off her CPAP. She has not been able to get it turned back on Sleep apnea status: uncontrolled Duration: chronic Satisfied with current treatment?:  no CPAP use:  no Sleep quality with CPAP use: excellent Treament compliance:poor compliance Last sleep study: 2.5 years ago Treatments attempted: CPAP  Wakes feeling refreshed:  no Daytime hypersomnolence:  yes Fatigue:  yes Insomnia:  yes Good sleep hygiene:  no Difficulty falling asleep:  yes Difficulty staying asleep:  yes Snoring bothers bed partner:  no Observed apnea by bed partner: yes Obesity:  yes Hypertension: no  Pulmonary hypertension:  no Coronary artery disease:  no  HYPERLIPIDEMIA Hyperlipidemia status: excellent compliance Satisfied with current treatment?  yes Side effects:  no Medication compliance: excellent compliance Past cholesterol meds: atorvastatin Supplements: none Aspirin:  no The 10-year ASCVD risk score (Arnett DK, et al., 2019) is: 6.3%   Values used to calculate the score:     Age: 69 years     Sex: Female     Is Non-Hispanic African American: No     Diabetic: No     Tobacco smoker: Yes     Systolic Blood Pressure: 270 mmHg     Is BP treated: No     HDL Cholesterol: 39 mg/dL     Total Cholesterol: 149 mg/dL Chest pain:  no Coronary artery disease:  no  HYPOTHYROIDISM Thyroid control status:controlled Satisfied with current treatment? yes Medication side effects: no Medication compliance: excellent  compliance Recent dose adjustment:no Fatigue: yes Cold intolerance: no Heat intolerance: no Weight gain: no Weight loss: no Constipation: no Diarrhea/loose stools: no Palpitations: no Lower extremity edema: no Anxiety/depressed mood: yes  DEPRESSION Mood status: exacerbated Satisfied with current treatment?: no Symptom severity: moderate  Duration of current treatment : chronic Side effects: no Medication compliance: excellent compliance Psychotherapy/counseling: no  Previous psychiatric medications: celexa Depressed mood: yes Anxious mood: yes Anhedonia: no Significant weight loss or gain: no Insomnia: yes  Fatigue: yes Feelings of worthlessness or guilt: no Impaired concentration/indecisiveness: no Suicidal ideations: no Hopelessness: yes Crying spells: no    08/10/2021    4:17 PM 01/30/2021    1:27 PM 12/13/2020    3:52 PM 09/14/2020    4:10 PM 11/22/2019   12:55 PM  Depression screen PHQ 2/9  Decreased Interest 0 0 0 0 0  Down, Depressed, Hopeless 0 1 0 0 1  PHQ - 2 Score 0 1 0 0 1  Altered sleeping '1 3 3 1 1  ' Tired, decreased energy '1 3 1 2 1  ' Change in appetite 0 0 0 0 2  Feeling bad or failure about yourself  0 0 0 0 0  Trouble concentrating 0 0 0 0 1  Moving slowly or fidgety/restless 0 0 0 0 0  Suicidal thoughts 0 0 0 0 0  PHQ-9 Score 2 7  '4 3 6  ' Difficult doing work/chores Not difficult at all Not difficult at all Not difficult at all Not difficult at all Somewhat difficult    GERD GERD control status: controlled Satisfied with current treatment? no Heartburn frequency: not on medicine Medication side effects: no  Medication compliance: excellent Dysphagia: no Odynophagia:  no Hematemesis: no Blood in stool: no   Relevant past medical, surgical, family and social history reviewed and updated as indicated. Interim medical history since our last visit reviewed. Allergies and medications reviewed and updated.  Review of Systems  Constitutional:  Negative.   HENT: Negative.    Respiratory: Negative.    Cardiovascular:  Positive for palpitations. Negative for chest pain and leg swelling.  Gastrointestinal: Negative.   Musculoskeletal: Negative.   Neurological: Negative.   Psychiatric/Behavioral:  Positive for dysphoric mood and sleep disturbance. Negative for agitation, behavioral problems, confusion, decreased concentration, hallucinations, self-injury and suicidal ideas. The patient is nervous/anxious. The patient is not hyperactive.    Per HPI unless specifically indicated above     Objective:    BP 100/66   Pulse 80   Temp 98 F (36.7 C)   Wt 204 lb (92.5 kg)   SpO2 97%   BMI 36.14 kg/m   Wt Readings from Last 3 Encounters:  09/22/21 204 lb (92.5 kg)  01/30/21 210 lb (95.3 kg)  12/13/20 208 lb 6.4 oz (94.5 kg)    Physical Exam Vitals and nursing note reviewed. Exam conducted with a chaperone present.  Constitutional:      General: She is not in acute distress.    Appearance: Normal appearance. She is obese. She is not ill-appearing, toxic-appearing or diaphoretic.  HENT:     Head: Normocephalic and atraumatic.     Right Ear: External ear normal.     Left Ear: External ear normal.     Nose: Nose normal.     Mouth/Throat:     Mouth: Mucous membranes are moist.     Pharynx: Oropharynx is clear.  Eyes:     General: No scleral icterus.       Right eye: No discharge.        Left eye: No discharge.     Extraocular Movements: Extraocular movements intact.     Conjunctiva/sclera: Conjunctivae normal.     Pupils: Pupils are equal, round, and reactive to light.  Cardiovascular:     Rate and Rhythm: Normal rate and regular rhythm.     Pulses: Normal pulses.     Heart sounds: Normal heart sounds. No murmur heard.   No friction rub. No gallop.  Pulmonary:     Effort: Pulmonary effort is normal. No respiratory distress.     Breath sounds: Normal breath sounds. No stridor. No wheezing, rhonchi or rales.  Chest:      Chest wall: No tenderness.  Genitourinary:    Labia:        Right: No rash, tenderness, lesion or injury.        Left: No rash, tenderness, lesion or injury.      Urethra: No prolapse, urethral pain, urethral swelling or urethral lesion.     Vagina: Normal.     Cervix: Normal.     Uterus: Normal.      Adnexa: Right adnexa normal and left adnexa normal.  Musculoskeletal:        General: Normal range of motion.     Cervical back: Normal range of motion and neck supple.  Skin:    General: Skin is  warm and dry.     Capillary Refill: Capillary refill takes less than 2 seconds.     Coloration: Skin is not jaundiced or pale.     Findings: No bruising, erythema, lesion or rash.  Neurological:     General: No focal deficit present.     Mental Status: She is alert and oriented to person, place, and time. Mental status is at baseline.  Psychiatric:        Mood and Affect: Mood normal.        Behavior: Behavior normal.        Thought Content: Thought content normal.        Judgment: Judgment normal.    Results for orders placed or performed in visit on 01/30/21  Microscopic Examination   BLD  Result Value Ref Range   WBC, UA 0-5 0 - 5 /hpf   RBC None seen 0 - 2 /hpf   Epithelial Cells (non renal) 0-10 0 - 10 /hpf   Mucus, UA Present (A) Not Estab.   Bacteria, UA Few (A) None seen/Few  Bayer DCA Hb A1c Waived  Result Value Ref Range   HB A1C (BAYER DCA - WAIVED) 5.5 4.8 - 5.6 %  CBC with Differential/Platelet  Result Value Ref Range   WBC 9.1 3.4 - 10.8 x10E3/uL   RBC 4.92 3.77 - 5.28 x10E6/uL   Hemoglobin 15.4 11.1 - 15.9 g/dL   Hematocrit 46.0 34.0 - 46.6 %   MCV 94 79 - 97 fL   MCH 31.3 26.6 - 33.0 pg   MCHC 33.5 31.5 - 35.7 g/dL   RDW 11.9 11.7 - 15.4 %   Platelets 258 150 - 450 x10E3/uL   Neutrophils 49 Not Estab. %   Lymphs 42 Not Estab. %   Monocytes 7 Not Estab. %   Eos 1 Not Estab. %   Basos 1 Not Estab. %   Neutrophils Absolute 4.5 1.4 - 7.0 x10E3/uL    Lymphocytes Absolute 3.8 (H) 0.7 - 3.1 x10E3/uL   Monocytes Absolute 0.6 0.1 - 0.9 x10E3/uL   EOS (ABSOLUTE) 0.1 0.0 - 0.4 x10E3/uL   Basophils Absolute 0.1 0.0 - 0.2 x10E3/uL   Immature Granulocytes 0 Not Estab. %   Immature Grans (Abs) 0.0 0.0 - 0.1 x10E3/uL  Comprehensive metabolic panel  Result Value Ref Range   Glucose 75 70 - 99 mg/dL   BUN 9 8 - 27 mg/dL   Creatinine, Ser 0.81 0.57 - 1.00 mg/dL   eGFR 81 >59 mL/min/1.73   BUN/Creatinine Ratio 11 (L) 12 - 28   Sodium 143 134 - 144 mmol/L   Potassium 4.4 3.5 - 5.2 mmol/L   Chloride 103 96 - 106 mmol/L   CO2 25 20 - 29 mmol/L   Calcium 9.1 8.7 - 10.3 mg/dL   Total Protein 6.6 6.0 - 8.5 g/dL   Albumin 4.5 3.8 - 4.8 g/dL   Globulin, Total 2.1 1.5 - 4.5 g/dL   Albumin/Globulin Ratio 2.1 1.2 - 2.2   Bilirubin Total 0.2 0.0 - 1.2 mg/dL   Alkaline Phosphatase 86 44 - 121 IU/L   AST 17 0 - 40 IU/L   ALT 11 0 - 32 IU/L  Lipid Panel w/o Chol/HDL Ratio  Result Value Ref Range   Cholesterol, Total 149 100 - 199 mg/dL   Triglycerides 163 (H) 0 - 149 mg/dL   HDL 39 (L) >39 mg/dL   VLDL Cholesterol Cal 28 5 - 40 mg/dL   LDL Chol Calc (NIH) 82  0 - 99 mg/dL  Urinalysis, Routine w reflex microscopic  Result Value Ref Range   Specific Gravity, UA 1.020 1.005 - 1.030   pH, UA 5.5 5.0 - 7.5   Color, UA Yellow Yellow   Appearance Ur Clear Clear   Leukocytes,UA Trace (A) Negative   Protein,UA Negative Negative/Trace   Glucose, UA Negative Negative   Ketones, UA Negative Negative   RBC, UA Negative Negative   Bilirubin, UA Negative Negative   Urobilinogen, Ur 0.2 0.2 - 1.0 mg/dL   Nitrite, UA Negative Negative   Microscopic Examination See below:   TSH  Result Value Ref Range   TSH 1.880 0.450 - 4.500 uIU/mL  Microalbumin, Urine Waived  Result Value Ref Range   Microalb, Ur Waived 30 (H) 0 - 19 mg/L   Creatinine, Urine Waived 200 10 - 300 mg/dL   Microalb/Creat Ratio <30 <30 mg/g  Cologuard  Result Value Ref Range   COLOGUARD  Sample Could Not Be Processed 9 Negative      Assessment & Plan:   Problem List Items Addressed This Visit       Cardiovascular and Mediastinum   Atrial fibrillation (HCC)    Stable today. Continue to follow with cardiology. Labs drawn today.       Relevant Medications   atorvastatin (LIPITOR) 40 MG tablet     Respiratory   COPD (chronic obstructive pulmonary disease) ([place])    Under good control on current regimen. Continue current regimen. Continue to monitor. Call with any concerns. Refills given.         OSA (obstructive sleep apnea)    Has been unable to get her CPAP due to homelessness. Cannot have sleep study for another 6 months due to coverage. Will get NCM involved to see if they can help.        Relevant Orders   AMB Referral to [facility]     Digestive   GERD (gastroesophageal reflux disease)    Under good control on current regimen. Continue current regimen. Continue to monitor. Call with any concerns. Refills given. Labs drawn today.        Relevant Medications   pantoprazole (PROTONIX) 20 MG tablet   Other Relevant Orders   Comprehensive metabolic panel   CBC with Differential/Platelet     Endocrine   Hypothyroidism - Primary    Rechecking labs today. Await results. Treat as needed.        Relevant Orders   Comprehensive metabolic panel   CBC with Differential/Platelet   TSH     Genitourinary   CKD (chronic kidney disease) stage 3, GFR 30-59 ml/min (HCC)    Rechecking labs today. Await results. Treat as needed.          Other   Hyperlipidemia    Under good control on current regimen. Continue current regimen. Continue to monitor. Call with any concerns. Refills given. Labs drawn today.         Relevant Medications   atorvastatin (LIPITOR) 40 MG tablet   Other Relevant Orders   Comprehensive metabolic panel   CBC with Differential/Platelet   Lipid Profile   Depression, major, recurrent, moderate (HCC)    Not  doing well due to social issues. Will increase her celexa to 17m. Call with any concerns. Continue to monitor. Will get social worker involved.        Relevant Medications   citalopram (CELEXA) 40 MG tablet   Other Relevant Orders   Comprehensive metabolic panel  CBC with Differential/Platelet   AMB Referral to [place]    Will get LCSW involved to see if there is anything they can do. Continue to monitor.        Relevant Orders   AMB Referral to [place]   Other Visit Diagnoses     Screening for skin cancer       Referral to dermatology placed today.   Relevant Orders   Ambulatory referral to Dermatology   Screening for cervical cancer       Pap done today.   Relevant Orders   Cytology - PAP        Follow up plan: Return in about 6 months (around 03/25/2022).

## 2021-09-22 NOTE — Assessment & Plan Note (Signed)
Will get LCSW involved to see if there is anything they can do. Continue to monitor.

## 2021-09-25 ENCOUNTER — Telehealth: Payer: Self-pay | Admitting: *Deleted

## 2021-09-25 NOTE — Chronic Care Management (AMB) (Unsigned)
  Chronic Care Management   Outreach Note  09/25/2021 Name: [name] MRN: [id number] DOB: 1955-08-07  [name] is a 66 y.o. year old female who is a primary care patient of Valerie Roys, DO. I reached out to [employer] by phone today in response to a referral sent by Ms. Aubry S Kalil's primary care provider.  An unsuccessful telephone outreach was attempted today. The patient was referred to the case management team for assistance with care management and care coordination.   Follow Up Plan: A HIPAA compliant phone message was left for the patient providing contact information and requesting a return call. The care management team will reach out to the patient again over the next 1 day. If patient returns call to provider office, please advise to call [place] at [phone].  [facility]  Direct Dial: [phone]

## 2021-09-26 NOTE — Chronic Care Management (AMB) (Signed)
  Chronic Care Management   Note  09/26/2021 Name: ALBERTA LENHARD MRN: [id number] DOB: October 28, 1955  Donia Ast Raybourn is a 66 y.o. year old female who is a primary care patient of Valerie Roys, DO. I reached out to [employer] by phone today in response to a referral sent by Ms. Ajooni S Sottile's PCP.  Ms. Sulak was given information about Chronic Care Management services today including:  CCM service includes personalized support from designated clinical staff supervised by her physician, including individualized plan of care and coordination with other care providers 24/7 contact phone numbers for assistance for urgent and routine care needs. Service will only be billed when office clinical staff spend 20 minutes or more in a month to coordinate care. Only one practitioner may furnish and bill the service in a calendar month. The patient may stop CCM services at any time (effective at the end of the month) by phone call to the office staff. The patient is responsible for co-pay (up to 20% after annual deductible is met) if co-pay is required by the individual health plan.   Patient agreed to services and verbal consent obtained.   Follow up plan: Telephone appointment with care management team member scheduled for: SW on 10/09/21 and RNCM on 10/10/21  [facility]  Direct Dial: [phone]

## 2021-09-27 LAB — CYTOLOGY - PAP
Adequacy: ABSENT
Comment: NEGATIVE
Diagnosis: NEGATIVE
High risk HPV: NEGATIVE

## 2021-09-28 ENCOUNTER — Ambulatory Visit: Payer: Medicare Other | Admitting: Internal Medicine

## 2021-10-09 ENCOUNTER — Ambulatory Visit: Payer: BC Managed Care – PPO | Admitting: Licensed Clinical Social Worker

## 2021-10-09 DIAGNOSIS — N1831 Chronic kidney disease, stage 3a: Secondary | ICD-10-CM

## 2021-10-09 DIAGNOSIS — I480 Paroxysmal atrial fibrillation: Secondary | ICD-10-CM

## 2021-10-09 DIAGNOSIS — Z5900 Homelessness unspecified: Secondary | ICD-10-CM

## 2021-10-09 DIAGNOSIS — F331 Major depressive disorder, recurrent, moderate: Secondary | ICD-10-CM

## 2021-10-09 DIAGNOSIS — F411 Generalized anxiety disorder: Secondary | ICD-10-CM

## 2021-10-10 ENCOUNTER — Telehealth: Payer: Medicare Other

## 2021-10-13 ENCOUNTER — Telehealth: Payer: Self-pay

## 2021-10-13 NOTE — Chronic Care Management (AMB) (Signed)
Chronic Care Management    Clinical Social Work Note  10/13/2021 Name: [name] MRN: [id number] DOB: July 16, 1955  [name] is a 66 y.o. year old female who is a primary care patient of Valerie Roys, DO. The CCM team was consulted to assist the patient with chronic disease management and/or care coordination needs related to: [employer]  and [place] and Resources.   Engaged with patient by telephone for initial visit in response to provider referral for social work chronic care management and care coordination services.   Consent to Services:  The patient was given the following information about Chronic Care Management services today, agreed to services, and gave verbal consent: 1. CCM service includes personalized support from designated clinical staff supervised by the primary care provider, including individualized plan of care and coordination with other care providers 2. 24/7 contact phone numbers for assistance for urgent and routine care needs. 3. Service will only be billed when office clinical staff spend 20 minutes or more in a month to coordinate care. 4. Only one practitioner may furnish and bill the service in a calendar month. 5.The patient may stop CCM services at any time (effective at the end of the month) by phone call to the office staff. 6. The patient will be responsible for cost sharing (co-pay) of up to 20% of the service fee (after annual deductible is met). Patient agreed to services and consent obtained.  Patient agreed to services and consent obtained.   Assessment: Review of patient past medical history, allergies, medications, and health status, including review of relevant consultants reports was performed today as part of a comprehensive evaluation and provision of chronic care management and care coordination services.     SDOH (Social Determinants of Health) assessments and interventions performed:    Advanced Directives  Status: Not addressed in this encounter.  CCM Care Plan  Allergies  Allergen Reactions   Bee Venom Anaphylaxis   Penicillins Rash   Sulfonamide Derivatives Rash   Cardizem  [Diltiazem Hcl] Other (See Comments)    Other Reaction: severe hypotension w/ IV dose   Sulfa Antibiotics Hives   Tape Rash    Uncoded Allergy. Allergen: tape & bandaids    Outpatient Encounter Medications as of 10/09/2021  Medication Sig   atorvastatin (LIPITOR) 40 MG tablet Take 1 tablet (40 mg total) by mouth daily.   citalopram (CELEXA) 40 MG tablet Take 1.5 tablets (60 mg total) by mouth daily.   levothyroxine (SYNTHROID) 125 MCG tablet Take 1 tablet (125 mcg total) by mouth daily with breakfast.   pantoprazole (PROTONIX) 20 MG tablet Take 1 tablet (20 mg total) by mouth daily.   propafenone (RYTHMOL) 225 MG tablet TAKE ONE TABLET BY MOUTH DAILY AS NEEDED FOR AFIB   No facility-administered encounter medications on file as of 10/09/2021.    Patient Active Problem List   Diagnosis Date Noted   Homelessness 09/22/2021   Advance directive discussed with patient 01/30/2021   Scoliosis 08/02/2015   Chronic fatigue 02/06/2015   CKD (chronic kidney disease) stage 3, GFR 30-59 ml/min (HCC) 02/06/2015   Nasal polyp, posterior 02/03/2015   Muscle spasms of head and/or neck 11/11/2014   Neck pain of over 3 months duration 10/21/2014   GERD (gastroesophageal reflux disease)    Hypothyroidism    Migraines    Depression, major, recurrent, moderate ([place]) 07/20/2014    Class: Chronic   OSA (obstructive sleep apnea) 02/15/2014   Atrial fibrillation ([place]) 08/29/2012  Dyspnea on exertion 08/29/2012   DEGENERATIVE JOINT DISEASE 02/25/2008   Hyperlipidemia 01/29/2007   COPD (chronic obstructive pulmonary disease) ([place]) 01/29/2007   Anxiety state 01/28/2007   Tobacco abuse 01/28/2007   OVARIAN CYST, LEFT 05/14/2006    Conditions to be addressed/monitored: Atrial Fibrillation, COPD, CKD Stage 3, Anxiety, and  Depression; Limited social support and Housing barriers  Care Plan : LCSW Plan of Care  Updates made by Rebekah [place], LCSW since 10/13/2021 12:00 AM     Problem: Barriers to Treatment      Long-Range Goal: Barriers to Treatment Identified and Managed   Start Date: 10/09/2021  Expected End Date: 03/06/2022  This Visit's Progress: On track  Priority: High  Note:   Current Barriers:  Limited social support, Housing barriers, and Mental Health Concerns    CSW Clinical Goal(s):  Patient  will demonstrate a reduction in symptoms related to :Depression: anxiety  explore community resource options for unmet needs related to:  Housing  through collaboration with [place] representative, provider, and care team.   Interventions: Patient reports barriers in obtaining independent housing which negatively impacts management of depression symptoms. She is open to surrounding counties [place] and [place] Patient is employed with [employer] and states she is over income for most program assistance. Reports financial strain from having to pay Social Security monthly for overpayment. Legal aid was unable to assist Patient reports compliance with medication management 1:1 collaboration with primary care provider regarding development and update of comprehensive plan of care as evidenced by provider attestation and co-signature Inter-disciplinary care team collaboration (see longitudinal plan of care) Evaluation of current treatment plan related to  self management and patient's adherence to plan as established by provider Review resources, discussed options and provided patient information about  Housing resources ([employer]) Referral to Care Guide completed Solution-Focused Strategies employed:  Active listening / Reflection utilized  Emotional Support Provided Verbalization of feelings [place] representative / information provided    Task & activities to accomplish  goals: Attend provider appointments Utilize resources and/or healthy coping skills discussed Contact PCP office with any questions or concerns         Follow Up Plan: SW will follow up with patient by phone over the next 4 weeks      Christa See, MSW, [email or web address] Phone [phone] 12:40 AM

## 2021-10-13 NOTE — Patient Instructions (Signed)
Visit Information  Thank you for taking time to visit with me today. Please don't hesitate to contact me if I can be of assistance to you before our next scheduled telephone appointment.  Following are the goals we discussed today:  Task & activities to accomplish goals: Attend provider appointments Utilize resources and/or healthy coping skills discussed Contact PCP office with any questions or concerns  Our next appointment is by telephone on 10/23/21 at 1:15 PM  Please call the care guide team at [phone] if you need to cancel or reschedule your appointment.   If you are experiencing a Mental Health or Behavioral Health Crisis or need someone to talk to, please call the Suicide and Crisis Lifeline: 988 call 911   Following is a copy of your full plan of care:  Care Plan : LCSW Plan of Care  Updates made by Bridgett Larsson, LCSW since 10/13/2021 12:00 AM     Problem: Barriers to Treatment      Long-Range Goal: Barriers to Treatment Identified and Managed   Start Date: 10/09/2021  Expected End Date: 03/06/2022  This Visit's Progress: On track  Priority: High  Note:   Current Barriers:  Limited social support, Housing barriers, and Mental Health Concerns    CSW Clinical Goal(s):  Patient  will demonstrate a reduction in symptoms related to :Depression: anxiety  explore community resource options for unmet needs related to:  Housing  through collaboration with Visual merchandiser, provider, and care team.   Interventions: Patient reports barriers in obtaining independent housing which negatively impacts management of depression symptoms. She is open to surrounding counties [place] and [place] Patient is employed with [employer] and states she is over income for most program assistance. Reports financial strain from having to pay Social Security monthly for overpayment. Legal aid was unable to assist Patient reports compliance with medication management 1:1  collaboration with primary care provider regarding development and update of comprehensive plan of care as evidenced by provider attestation and co-signature Inter-disciplinary care team collaboration (see longitudinal plan of care) Evaluation of current treatment plan related to  self management and patient's adherence to plan as established by provider Review resources, discussed options and provided patient information about  Housing resources ([employer]) Referral to Care Guide completed Solution-Focused Strategies employed:  Active listening / Reflection utilized  Emotional Support Provided Verbalization of feelings encouraged  Crisis Resource Education / information provided    Task & activities to accomplish goals: Attend provider appointments Utilize resources and/or healthy coping skills discussed Contact PCP office with any questions or concerns        Ms. Kasperski was given information about Care Management services by the embedded care coordination team including:  Care Management services include personalized support from designated clinical staff supervised by her physician, including individualized plan of care and coordination with other care providers 24/7 contact phone numbers for assistance for urgent and routine care needs. The patient may stop CCM services at any time (effective at the end of the month) by phone call to the office staff.  Patient agreed to services and verbal consent obtained.   Patient verbalizes understanding of instructions and care plan provided today and agrees to view in MyChart. Active MyChart status and patient understanding of how to access instructions and care plan via MyChart confirmed with patient.     Jenel Lucks, MSW, LCSW [facility] Care Management [place]  [facility] [email or web address] Phone [phone] 12:44 AM

## 2021-10-13 NOTE — Telephone Encounter (Signed)
   Telephone encounter was:  Successful.  10/13/2021 Name: [name] MRN: [id number] DOB: 26-Jun-1955  [name] is a 66 y.o. year old female who is a primary care patient of Dorcas Carrow, DO . The community resource team was consulted for assistance with  housing  Care guide performed the following interventions: Patient provided with information about care guide support team and interviewed to confirm resource needs.Patient asked for housing resources for anywhere in [place]. Patient asked me to mail resources to her sons home which is where she is staying now.  Follow Up Plan:  Care guide will follow up with patient by phone over the next two weeks    [facility] Guide, Embedded Care Coordination [facility], Care Management  [phone] [address] [place], [place], [place] [address] Phone: [phone] Email: [email or web address]

## 2021-10-18 ENCOUNTER — Ambulatory Visit: Payer: Self-pay

## 2021-10-18 ENCOUNTER — Telehealth: Payer: Medicare Other

## 2021-10-18 DIAGNOSIS — F411 Generalized anxiety disorder: Secondary | ICD-10-CM

## 2021-10-18 DIAGNOSIS — G4733 Obstructive sleep apnea (adult) (pediatric): Secondary | ICD-10-CM

## 2021-10-18 DIAGNOSIS — F331 Major depressive disorder, recurrent, moderate: Secondary | ICD-10-CM

## 2021-10-18 DIAGNOSIS — E782 Mixed hyperlipidemia: Secondary | ICD-10-CM

## 2021-10-18 DIAGNOSIS — I480 Paroxysmal atrial fibrillation: Secondary | ICD-10-CM

## 2021-10-18 DIAGNOSIS — Z5900 Homelessness unspecified: Secondary | ICD-10-CM

## 2021-10-18 DIAGNOSIS — J449 Chronic obstructive pulmonary disease, unspecified: Secondary | ICD-10-CM

## 2021-10-18 NOTE — Patient Instructions (Addendum)
Visit Information   Thank you for taking time to visit with me today. Please don't hesitate to contact me if I can be of assistance to you before our next scheduled telephone appointment.  Following are the goals we discussed today:  (Copy and paste patient goals from clinical care plan here)  Our next appointment is by telephone on 12-13-2021 at 1145 am  Please call the care guide team at [phone] if you need to cancel or reschedule your appointment.   If you are experiencing a Mental Health or [place] or need someone to talk to, please call the Suicide and Crisis Lifeline: 988 call the [place] National Suicide Prevention Lifeline: 803-807-4448 or TTY: 4152641971 TTY 641-807-1913) to talk to a trained counselor call 1-800-273-TALK (toll free, 24 hour hotline)   Following is a copy of your full care plan:  Care Plan : RNCM: General Plan of Care (Adult) For Chronic Disease Management and Care Coordination Needs  Updates made by [name], RN since 10/18/2021 12:00 AM     Problem: RNCM: Development of plan of Care for Chronic Disease Management (Afib, HLD, COPD, OSA, Anxiety, Depression, Homelessness)   Priority: High  Onset Date: 10/18/2021     Long-Range Goal: RNCM: Effective Management of plan of Care for Chronic Disease Management (Afib, HLD, COPD, OSA, Anxiety, Depression, Homelessness)   Start Date: 10/18/2021  Expected End Date: 10/19/2022  Priority: High  Note:   Current Barriers:  Care Coordination needs related to Housing barriers, Mental Health Concerns , and Family and relationship dysfunction  Chronic Disease Management support and education needs related to HLD, COPD, and OSA, depression, and anxiety Film/video editor.  Homeless- currently staying with her daughter  RNCM Clinical Goal(s):  Patient will verbalize basic understanding of AFIB, HLD, COPD, Anxiety, Depression, and OSA disease process and self health management plan as evidenced by  routine appointments, routine lab testing, following the plan of care and working with the CCM team to effectively manage health and well being take all medications exactly as prescribed and will call provider for medication related questions as evidenced by taking medications as prescribed and calling for refills before running out    attend all scheduled medical appointments: with pcp and specialist as evidenced by keeping appointments and calling for schedule change needs        demonstrate improved and ongoing adherence to prescribed treatment plan for AFIB, HLD, COPD, Anxiety, Depression, and OSA as evidenced by VS stable, labs stable, heart rate managed, no acute changes in chronic conditions with exacerbations. work with Education officer, museum to Civil Service fast streamer constraints related to inability to afford care and homelessness, Housing barriers, Mental Health Concerns , and Family and relationship dysfunction related to the management of Atrial Fibrillation, HLD, COPD, Anxiety, Depression, and OSA as evidenced by review of EMR and patient or social worker report     demonstrate ongoing self health care management ability for effective management of chronic conditions as evidenced by  working with the CCM team   through collaboration with Consulting civil engineer, provider, and care team.   Interventions: 1:1 collaboration with primary care provider regarding development and update of comprehensive plan of care as evidenced by provider attestation and co-signature Inter-disciplinary care team collaboration (see longitudinal plan of care) Evaluation of current treatment plan related to  self management and patient's adherence to plan as established by provider   A-fib:  (Status: New goal.) Long Term Goal  Counseled on increased risk of stroke due to  Afib and benefits of anticoagulation for stroke prevention           Reviewed importance of adherence to anticoagulant exactly as prescribed Advised patient to  discuss changes in heart rate or new concerns related to AFIB and heart health with provider Counseled on bleeding risk associated with AFIB and importance of self-monitoring for signs/symptoms of bleeding Counseled on avoidance of NSAIDs due to increased bleeding risk with anticoagulants Counseled on importance of regular laboratory monitoring as prescribed Counseled on seeking medical attention after a head injury or if there is blood in the urine/stool Afib action plan reviewed Screening for signs and symptoms of depression related to chronic disease state Assessed social determinant of health barriers  COPD/OSA: (Status: New goal.) Long Term Goal  Reviewed medications with patient, including use of prescribed maintenance and rescue inhalers, and provided instruction on medication management and the importance of adherence Provided patient with basic written and verbal COPD education on self care/management/and exacerbation prevention. 10-18-2021: The patient denies any issues with her COPD. Has not been able to use her CPAP in over a year due to her living conditions. Has tried to work with the company but has not been able to get answers or help. Educational information sent to the patient today about programs that help with CPAP equipment. Advised patient to track and manage COPD triggers Provided written and verbal instructions on pursed lip breathing and utilized returned demonstration as teach back Provided instruction about proper use of medications used for management of COPD including inhalers Advised patient to self assesses COPD action plan zone and make appointment with provider if in the yellow zone for 48 hours without improvement Advised patient to engage in light exercise as tolerated 3-5 days a week to aid in the the management of COPD Provided education about and advised patient to utilize infection prevention strategies to reduce risk of respiratory infection Discussed the  importance of adequate rest and management of fatigue with COPD CPAP needs. 10-18-2021: Has a CPAP but it has been turned off by the company due to the inability of the patient to pay and also was homeless for a while. She needs a CPAP that is functional. Educational information sent by [facility] today and other resources to be sent to the patient for assistance in getting a working and functional CPAP machine.  Anxiety, depression and homelessness  (Status: New goal.) Long Term Goal  Evaluation of current treatment plan related to Anxiety, Depression, and Homelessness , Financial constraints related to inability to afford medical equipment, care, and stable housing, Housing barriers, Mental Health Concerns , and Family and relationship dysfunction self-management and patient's adherence to plan as established by provider. Discussed plans with patient for ongoing care management follow up and provided patient with direct contact information for care management team Advised patient to call the office for changes in mood, anxiety, depression, or mental health concerns; Provided education to patient re: resources in [facility], review of care guide referral with information being mailed to the patient and emailing, Pharmacist, community education, and EMMI for support and help with resources; Reviewed medications with patient and discussed The patient states the Celexa change has really been helpful for her and she can tell a positive difference since the change; Provided patient with housing, [facility] services and support, cpap information, and healthy eating options educational materials related to effective management of chronic condtions; Reviewed scheduled/upcoming provider appointments including 03-26-2022 at 1020 am; Care Guide referral for resources in [facility]. Referral place  by LCSW last week and the care guide spoke to the patient on 10-13-2021 and sent information in mail; Social Work referral for  ongoing support and education related to housing barriers, mental health and resources. Has spoken with the LCSW and will follow up with the LCSW next week; Discussed plans with patient for ongoing care management follow up and provided patient with direct contact information for care management team; Advised patient to discuss changes in mood, anxiety, depression, or mental health concerns with provider; Screening for signs and symptoms of depression related to chronic disease state;  Assessed social determinant of health barriers;   Hyperlipidemia:  (Status: New goal.) Long Term Goal  Lab Results  Component Value Date   CHOL 149 01/30/2021   HDL 39 (L) 01/30/2021   LDLCALC 82 01/30/2021   LDLDIRECT 150.0 03/18/2007   TRIG 163 (H) 01/30/2021   CHOLHDL 6.2 11/20/2019     Medication review performed; medication list updated in electronic medical record.  Provider established cholesterol goals reviewed; Counseled on importance of regular laboratory monitoring as prescribed; Provided HLD educational materials; Reviewed role and benefits of statin for ASCVD risk reduction; Discussed strategies to manage statin-induced myalgias; Reviewed importance of limiting foods high in cholesterol; Reviewed exercise goals and target of 150 minutes per week;  Patient Goals/Self-Care Activities: Take medications as prescribed   Attend all scheduled provider appointments Call pharmacy for medication refills 3-7 days in advance of running out of medications Attend church or other social activities Perform all self care activities independently  Perform IADL's (shopping, preparing meals, housekeeping, managing finances) independently Call provider office for new concerns or questions  Work with the social worker to address care coordination needs and will continue to work with the clinical team to address health care and disease management related needs call the Suicide and Crisis Lifeline: 988 call  the [place] National Suicide Prevention Lifeline: 984-042-6710 or TTY: 607 010 4726 TTY 801-580-4115) to talk to a trained counselor call 1-800-273-TALK (toll free, 24 hour hotline) if experiencing a Mental Health or [place]  eliminate smoking in my home identify and avoid work-related triggers identify and remove indoor air pollutants limit outdoor activity during cold weather listen for public air quality announcements every day do breathing exercises every day begin a symptom diary develop a rescue plan eliminate symptom triggers at home follow rescue plan if symptoms flare-up keep follow-up appointments: with pcp and specialist on a regular basis use an extra pillow to sleep develop a new routine to improve sleep don't eat or exercise right before bedtime eat healthy/prescribed diet: heart healthy diet get at least 7 to 8 hours of sleep at night use devices that will help like a cane, sock-puller or reacher practice relaxation or meditation daily do exercises in a comfortable position that makes breathing as easy as possible - begin a symptom diary - bring symptom diary to all appointments - check pulse (heart) rate before taking medicine - check pulse (heart) rate once a day - make a plan to exercise regularly - make a plan to eat healthy - keep all lab appointments - take medicine as prescribed - call for medicine refill 2 or 3 days before it runs out - take all medications exactly as prescribed - call doctor with any symptoms you believe are related to your medicine - call doctor when you experience any new symptoms - go to all doctor appointments as scheduled - adhere to prescribed diet: heart healthy - develop an exercise routine  Consent to CCM Services: [name] was given information about Chronic Care Management services including:  CCM service includes personalized support from designated clinical staff supervised by her physician,  including individualized plan of care and coordination with other care providers 24/7 contact phone numbers for assistance for urgent and routine care needs. Service will only be billed when office clinical staff spend 20 minutes or more in a month to coordinate care. Only one practitioner may furnish and bill the service in a calendar month. The patient may stop CCM services at any time (effective at the end of the month) by phone call to the office staff. The patient will be responsible for cost sharing (co-pay) of up to 20% of the service fee (after annual deductible is met).  Patient agreed to services and verbal consent obtained.   Patient verbalizes understanding of instructions and care plan provided today and agrees to view in [place]. Active MyChart status and patient understanding of how to access instructions and care plan via MyChart confirmed with patient.     Telephone follow up appointment with care management team member scheduled for: 12-13-2021 at 1145 am  [place], MSN, [facility] Mobile: [phone]   Mindfulness-Based Stress Reduction Mindfulness-based stress reduction (MBSR) is a program that helps people learn to practice mindfulness. Mindfulness is the practice of consciously paying attention to the present moment. MBSR focuses on developing self-awareness, which lets you respond to life stress without judgment or negative feelings. It can be learned and practiced through techniques such as education, breathing exercises, meditation, and yoga. MBSR includes several mindfulness techniques in one program. MBSR works best when you understand the treatment, are willing to try new things, and can commit to spending time practicing what you learn. MBSR training may include learning about: How your feelings, thoughts, and reactions affect your body. New ways to respond to things that cause  negative thoughts to start (triggers). How to notice your thoughts and let go of them. Practicing awareness of everyday things that you normally do without thinking. The techniques and goals of different types of meditation. What are the benefits of MBSR? MBSR can have many benefits, which include helping you to: Develop self-awareness. This means knowing and understanding yourself. Learn skills and attitudes that help you to take part in your own health care. Learn new ways to care for yourself. Be more accepting about how things are, and let things go. Be less judgmental and approach things with an open mind. Be patient with yourself and trust yourself more. MBSR has also been shown to: Reduce negative emotions, such as sadness, overwhelm, and worry. Improve memory and focus. Change how you sense and react to pain. Boost your body's ability to fight infections. Help you connect better with other people. Improve your sense of well-being. How to practice mindfulness To do a basic awareness exercise: Find a comfortable place to sit. Pay attention to the present moment. Notice your thoughts, feelings, and surroundings just as they are. Avoid judging yourself, your feelings, or your surroundings. Make note of any judgment that comes up and let it go. Your mind may wander, and that is okay. Make note of when your thoughts drift, and return your attention to the present moment. To do basic mindfulness meditation: Find a comfortable place to sit. This may include a stable chair or a firm floor cushion. Sit upright with your back straight. Let your arms fall next  to your sides, with your hands resting on your legs. If you are sitting in a chair, rest your feet flat on the floor. If you are sitting on a cushion, cross your legs in front of you. Keep your head in a neutral position with your chin dropped slightly. Relax your jaw and rest the tip of your tongue on the roof of your mouth. Drop  your gaze to the floor or close your eyes. Breathe normally and pay attention to your breath. Feel the air moving in and out of your nose. Feel your belly expanding and relaxing with each breath. Your mind may wander, and that is okay. Make note of when your thoughts drift, and return your attention to your breath. Avoid judging yourself, your feelings, or your surroundings. Make note of any judgment or feelings that come up, let them go, and bring your attention back to your breath. When you are ready, lift your gaze or open your eyes. Pay attention to how your body feels after the meditation. Follow these instructions at home:  Find a local in-person or online MBSR program. Set aside some time regularly for mindfulness practice. Practice every day if you can. Even 10 minutes of practice is helpful. Find a mindfulness practice that works best for you. This may include one or more of the following: Meditation. This involves focusing your mind on a certain thought or activity. Breathing awareness exercises. These help you to stay present by focusing on your breath. Body scan. For this practice, you lie down and pay attention to each part of your body from head to toe. You can identify tension and soreness and consciously relax parts of your body. Yoga. Yoga involves stretching and breathing, and it can improve your ability to move and be flexible. It can also help you to test your body's limits, which can help you release stress. Mindful eating. This way of eating involves focusing on the taste, texture, color, and smell of each bite of food. This slows down eating and helps you feel full sooner. For this reason, it can be an important part of a weight loss plan. Find a podcast or recording that provides guidance for breathing awareness, body scan, or meditation exercises. You can listen to these any time when you have a free moment to rest without distractions. Follow your treatment plan as told by  your health care provider. This may include taking regular medicines and making changes to your diet or lifestyle as recommended. Where to find more information You can find more information about MBSR from: Your health care provider. Community-based meditation centers or programs. Programs offered near you. Summary Mindfulness-based stress reduction (MBSR) is a program that teaches you how to consciously pay attention to the present moment. It is used to help you deal better with daily stress, feelings, and pain. MBSR focuses on developing self-awareness, which allows you to respond to life stress without judgment or negative feelings. MBSR programs may involve learning different mindfulness practices, such as breathing exercises, meditation, yoga, body scan, or mindful eating. Find a mindfulness practice that works best for you, and set aside time for it on a regular basis. This information is not intended to replace advice given to you by your health care provider. Make sure you discuss any questions you have with your health care provider. Document Revised: 12/01/2020 Document Reviewed: 12/01/2020 Elsevier Patient Education  [name].

## 2021-10-18 NOTE — Chronic Care Management (AMB) (Signed)
Chronic Care Management   CCM RN Visit Note  10/18/2021 Name: [name] MRN: [id number] DOB: [birth year]  Subjective: [name] is a 66 y.o. year old female who is a primary care patient of Valerie Roys, DO. The care management team was consulted for assistance with disease management and care coordination needs.    Engaged with patient by telephone for initial visit in response to provider referral for case management and/or care coordination services.   Consent to Services:  The patient was given the following information about Chronic Care Management services today, agreed to services, and gave verbal consent: 1. CCM service includes personalized support from designated clinical staff supervised by the primary care provider, including individualized plan of care and coordination with other care providers 2. 24/7 contact phone numbers for assistance for urgent and routine care needs. 3. Service will only be billed when office clinical staff spend 20 minutes or more in a month to coordinate care. 4. Only one practitioner may furnish and bill the service in a calendar month. 5.The patient may stop CCM services at any time (effective at the end of the month) by phone call to the office staff. 6. The patient will be responsible for cost sharing (co-pay) of up to 20% of the service fee (after annual deductible is met). Patient agreed to services and consent obtained.  Patient agreed to services and verbal consent obtained.   Assessment: Review of patient past medical history, allergies, medications, health status, including review of consultants reports, laboratory and other test data, was performed as part of comprehensive evaluation and provision of chronic care management services.   SDOH (Social Determinants of Health) assessments and interventions performed:  SDOH Interventions    Flowsheet Row Most Recent Value  SDOH Interventions   Food Insecurity Interventions Intervention  Not Indicated  Financial Strain Interventions Other (Comment)  [working with LCSW for assistance and care guides]  Housing Interventions Other (Comment)  [Needs housing options, currently living with family]  Stress Interventions Other (Comment)  [Is homeless- living with family. Needs housing]  Social Connections Interventions Other (Comment)  [has good support from her older sister, has friends and family, needs resources to help her get stable with finances]  Transportation Interventions Intervention Not Indicated        CCM Care Plan  Allergies  Allergen Reactions   Bee Venom Anaphylaxis   Penicillins Rash   Sulfonamide Derivatives Rash   Cardizem  [Diltiazem Hcl] Other (See Comments)    Other Reaction: severe hypotension w/ IV dose   Sulfa Antibiotics Hives   Tape Rash    Uncoded Allergy. Allergen: tape & bandaids    Outpatient Encounter Medications as of 10/18/2021  Medication Sig   atorvastatin (LIPITOR) 40 MG tablet Take 1 tablet (40 mg total) by mouth daily.   citalopram (CELEXA) 40 MG tablet Take 1.5 tablets (60 mg total) by mouth daily.   levothyroxine (SYNTHROID) 125 MCG tablet Take 1 tablet (125 mcg total) by mouth daily with breakfast.   pantoprazole (PROTONIX) 20 MG tablet Take 1 tablet (20 mg total) by mouth daily.   propafenone (RYTHMOL) 225 MG tablet TAKE ONE TABLET BY MOUTH DAILY AS NEEDED FOR AFIB   No facility-administered encounter medications on file as of 10/18/2021.    Patient Active Problem List   Diagnosis Date Noted   Homelessness 09/22/2021   Advance directive discussed with patient 01/30/2021   Scoliosis 08/02/2015   Chronic fatigue 02/06/2015   CKD (chronic kidney disease) stage  3, GFR 30-59 ml/min (HCC) 02/06/2015   Nasal polyp, posterior 02/03/2015   Muscle spasms of head and/or neck 11/11/2014   Neck pain of over 3 months duration 10/21/2014   GERD (gastroesophageal reflux disease)    Hypothyroidism    Migraines    Depression, major,  recurrent, moderate (HCC) 07/20/2014    Class: Chronic   OSA (obstructive sleep apnea) 02/15/2014   Atrial fibrillation ([place]) 08/29/2012   Dyspnea on exertion 08/29/2012   DEGENERATIVE JOINT DISEASE 02/25/2008   Hyperlipidemia 01/29/2007   COPD (chronic obstructive pulmonary disease) ([place]) 01/29/2007   Anxiety state 01/28/2007   Tobacco abuse 01/28/2007   OVARIAN CYST, LEFT 05/14/2006    Conditions to be addressed/monitored:Atrial Fibrillation, HLD, COPD, Anxiety, Depression, and OSA  Care Plan : RNCM: General Plan of Care (Adult) For Chronic Disease Management and Care Coordination Needs  Updates made by [name], RN since 10/18/2021 12:00 AM     Problem: RNCM: Development of plan of Care for Chronic Disease Management (Afib, HLD, COPD, OSA, Anxiety, Depression, Homelessness)   Priority: High  Onset Date: 10/18/2021     Long-Range Goal: RNCM: Effective Management of plan of Care for Chronic Disease Management (Afib, HLD, COPD, OSA, Anxiety, Depression, Homelessness)   Start Date: 10/18/2021  Expected End Date: 10/19/2022  Priority: High  Note:   Current Barriers:  Care Coordination needs related to Housing barriers, Mental Health Concerns , and Family and relationship dysfunction  Chronic Disease Management support and education needs related to HLD, COPD, and OSA, depression, and anxiety Film/video editor.  Homeless- currently staying with her daughter  RNCM Clinical Goal(s):  Patient will verbalize basic understanding of AFIB, HLD, COPD, Anxiety, Depression, and OSA disease process and self health management plan as evidenced by routine appointments, routine lab testing, following the plan of care and working with the CCM team to effectively manage health and well being take all medications exactly as prescribed and will call provider for medication related questions as evidenced by taking medications as prescribed and calling for refills before running out    attend  all scheduled medical appointments: with pcp and specialist as evidenced by keeping appointments and calling for schedule change needs        demonstrate improved and ongoing adherence to prescribed treatment plan for AFIB, HLD, COPD, Anxiety, Depression, and OSA as evidenced by VS stable, labs stable, heart rate managed, no acute changes in chronic conditions with exacerbations. work with Education officer, museum to Civil Service fast streamer constraints related to inability to afford care and homelessness, Housing barriers, Mental Health Concerns , and Family and relationship dysfunction related to the management of Atrial Fibrillation, HLD, COPD, Anxiety, Depression, and OSA as evidenced by review of EMR and patient or social worker report     demonstrate ongoing self health care management ability for effective management of chronic conditions as evidenced by  working with the CCM team   through collaboration with Consulting civil engineer, provider, and care team.   Interventions: 1:1 collaboration with primary care provider regarding development and update of comprehensive plan of care as evidenced by provider attestation and co-signature Inter-disciplinary care team collaboration (see longitudinal plan of care) Evaluation of current treatment plan related to  self management and patient's adherence to plan as established by provider   A-fib:  (Status: New goal.) Long Term Goal  Counseled on increased risk of stroke due to Afib and benefits of anticoagulation for stroke prevention           Reviewed  importance of adherence to anticoagulant exactly as prescribed Advised patient to discuss changes in heart rate or new concerns related to AFIB and heart health with provider Counseled on bleeding risk associated with AFIB and importance of self-monitoring for signs/symptoms of bleeding Counseled on avoidance of NSAIDs due to increased bleeding risk with anticoagulants Counseled on importance of regular laboratory monitoring as  prescribed Counseled on seeking medical attention after a head injury or if there is blood in the urine/stool Afib action plan reviewed Screening for signs and symptoms of depression related to chronic disease state Assessed social determinant of health barriers  COPD/OSA: (Status: New goal.) Long Term Goal  Reviewed medications with patient, including use of prescribed maintenance and rescue inhalers, and provided instruction on medication management and the importance of adherence Provided patient with basic written and verbal COPD education on self care/management/and exacerbation prevention. 10-18-2021: The patient denies any issues with her COPD. Has not been able to use her CPAP in over a year due to her living conditions. Has tried to work with the company but has not been able to get answers or help. Educational information sent to the patient today about programs that help with CPAP equipment. Advised patient to track and manage COPD triggers Provided written and verbal instructions on pursed lip breathing and utilized returned demonstration as teach back Provided instruction about proper use of medications used for management of COPD including inhalers Advised patient to self assesses COPD action plan zone and make appointment with provider if in the yellow zone for 48 hours without improvement Advised patient to engage in light exercise as tolerated 3-5 days a week to aid in the the management of COPD Provided education about and advised patient to utilize infection prevention strategies to reduce risk of respiratory infection Discussed the importance of adequate rest and management of fatigue with COPD CPAP needs. 10-18-2021: Has a CPAP but it has been turned off by the company due to the inability of the patient to pay and also was homeless for a while. She needs a CPAP that is functional. Educational information sent by [facility] today and other resources to be sent to the patient for  assistance in getting a working and functional CPAP machine.  Anxiety, depression and homelessness  (Status: New goal.) Long Term Goal  Evaluation of current treatment plan related to Anxiety, Depression, and Homelessness , Financial constraints related to inability to afford medical equipment, care, and stable housing, Housing barriers, Mental Health Concerns , and Family and relationship dysfunction self-management and patient's adherence to plan as established by provider. Discussed plans with patient for ongoing care management follow up and provided patient with direct contact information for care management team Advised patient to call the office for changes in mood, anxiety, depression, or mental health concerns; Provided education to patient re: resources in [facility], review of care guide referral with information being mailed to the patient and emailing, Pharmacist, community education, and EMMI for support and help with resources; Reviewed medications with patient and discussed The patient states the Celexa change has really been helpful for her and she can tell a positive difference since the change; Provided patient with housing, [facility] services and support, cpap information, and healthy eating options educational materials related to effective management of chronic condtions; Reviewed scheduled/upcoming provider appointments including 03-26-2022 at 1020 am; Care Guide referral for resources in [facility]. Referral place by LCSW last week and the care guide spoke to the patient on 10-13-2021 and sent information in mail;  Social Work referral for ongoing support and education related to housing barriers, mental health and resources. Has spoken with the LCSW and will follow up with the LCSW next week; Discussed plans with patient for ongoing care management follow up and provided patient with direct contact information for care management team; Advised patient to discuss changes in mood,  anxiety, depression, or mental health concerns with provider; Screening for signs and symptoms of depression related to chronic disease state;  Assessed social determinant of health barriers;   Hyperlipidemia:  (Status: New goal.) Long Term Goal  Lab Results  Component Value Date   CHOL 149 01/30/2021   HDL 39 (L) 01/30/2021   LDLCALC 82 01/30/2021   LDLDIRECT 150.0 03/18/2007   TRIG 163 (H) 01/30/2021   CHOLHDL 6.2 11/20/2019     Medication review performed; medication list updated in electronic medical record.  Provider established cholesterol goals reviewed; Counseled on importance of regular laboratory monitoring as prescribed; Provided HLD educational materials; Reviewed role and benefits of statin for ASCVD risk reduction; Discussed strategies to manage statin-induced myalgias; Reviewed importance of limiting foods high in cholesterol; Reviewed exercise goals and target of 150 minutes per week;  Patient Goals/Self-Care Activities: Take medications as prescribed   Attend all scheduled provider appointments Call pharmacy for medication refills 3-7 days in advance of running out of medications Attend church or other social activities Perform all self care activities independently  Perform IADL's (shopping, preparing meals, housekeeping, managing finances) independently Call provider office for new concerns or questions  Work with the social worker to address care coordination needs and will continue to work with the clinical team to address health care and disease management related needs call the Suicide and Crisis Lifeline: 988 call the [place] National Suicide Prevention Lifeline: 6203479509 or TTY: (402) 707-4864 TTY 437-097-4564) to talk to a trained counselor call 1-800-273-TALK (toll free, 24 hour hotline) if experiencing a Mental Health or [place]  eliminate smoking in my home identify and avoid work-related triggers identify and remove indoor air  pollutants limit outdoor activity during cold weather listen for public air quality announcements every day do breathing exercises every day begin a symptom diary develop a rescue plan eliminate symptom triggers at home follow rescue plan if symptoms flare-up keep follow-up appointments: with pcp and specialist on a regular basis use an extra pillow to sleep develop a new routine to improve sleep don't eat or exercise right before bedtime eat healthy/prescribed diet: heart healthy diet get at least 7 to 8 hours of sleep at night use devices that will help like a cane, sock-puller or reacher practice relaxation or meditation daily do exercises in a comfortable position that makes breathing as easy as possible - begin a symptom diary - bring symptom diary to all appointments - check pulse (heart) rate before taking medicine - check pulse (heart) rate once a day - make a plan to exercise regularly - make a plan to eat healthy - keep all lab appointments - take medicine as prescribed - call for medicine refill 2 or 3 days before it runs out - take all medications exactly as prescribed - call doctor with any symptoms you believe are related to your medicine - call doctor when you experience any new symptoms - go to all doctor appointments as scheduled - adhere to prescribed diet: heart healthy - develop an exercise routine       Plan:Telephone follow up appointment with care management team member scheduled for:  12-13-2021 at 1145  am  Noreene Larsson RN, MSN, [facility] Mobile: [phone]

## 2021-10-23 ENCOUNTER — Ambulatory Visit: Payer: Medicare Other | Admitting: Licensed Clinical Social Worker

## 2021-10-23 DIAGNOSIS — F331 Major depressive disorder, recurrent, moderate: Secondary | ICD-10-CM

## 2021-10-23 DIAGNOSIS — F411 Generalized anxiety disorder: Secondary | ICD-10-CM

## 2021-10-31 ENCOUNTER — Telehealth: Payer: Self-pay

## 2021-10-31 NOTE — Telephone Encounter (Signed)
   Telephone encounter was:  Successful.  10/31/2021 Name: [name] MRN: [id number] DOB: 30-Aug-1955  [name] is a 66 y.o. year old female who is a primary care patient of Dorcas Carrow, DO . The community resource team was consulted for assistance with  housing  Care guide performed the following interventions: Patient provided with information about care guide support team and interviewed to confirm resource needs.Patient stated she didnt receive mailled resources so I have remailed information to her  Follow Up Plan:  Care guide will follow up with patient by phone over the next two weeks    [facility] Guide, Embedded Care Coordination [facility], Care Management  [phone] [address] [place], [place], [place] [address] Phone: [phone] Email: [email or web address]

## 2021-11-02 ENCOUNTER — Encounter: Payer: Self-pay | Admitting: Internal Medicine

## 2021-11-02 ENCOUNTER — Ambulatory Visit (INDEPENDENT_AMBULATORY_CARE_PROVIDER_SITE_OTHER): Payer: BC Managed Care – PPO | Admitting: Internal Medicine

## 2021-11-02 VITALS — BP 120/74 | HR 66 | Wt 201.0 lb

## 2021-11-02 DIAGNOSIS — I480 Paroxysmal atrial fibrillation: Secondary | ICD-10-CM

## 2021-11-02 DIAGNOSIS — R0600 Dyspnea, unspecified: Secondary | ICD-10-CM

## 2021-11-02 MED ORDER — FUROSEMIDE 20 MG PO TABS: ORAL_TABLET | ORAL | 0 refills | Status: DC

## 2021-11-02 MED ORDER — PROPAFENONE HCL 225 MG PO TABS: ORAL_TABLET | ORAL | 0 refills | Status: DC

## 2021-11-02 NOTE — Progress Notes (Signed)
ELECTROPHYSIOLOGY OFFICE NOTE  [name], MRN: [id number], DOB/AGE: 09-05-1955 66 y.o. Admit date: (Not on file) Date of Consult: 11/02/2021  Primary Physician: Dorcas Carrow, DO Primary Cardiologist: sk        HPI  [name] is a 66 y.o. female seen to reestablish care having been seen previously for atrial fibrillation Most recently in 2017 and before that 2014.  Her atrial fibrillation history dates back to the 90s.  She has a strong family history of early onset atrial fibrillation.  We do not have documentation of A. Fib.  Interestingly, her episodes can be sometimes terminated in the first couple of minutes with Valsalva or cough.  If not then may seem to have a change in character as she developed tunnel vision and lightheadedness.  When initially seen, given the infrequency of events, we had elected to go from propafenone daily--propafenone as needed.  She is using 2-3 times in the last 4 years.  Continues with exertional dyspnea, worsening.  Some exertional back discomfort but does not relieved with rest.  No nocturnal dyspnea orthopnea.  No palpitations. Had COVID intercurrently.  Lost teeth.  Has not felt quite right since.  Hypercholesterolemia and now at target  Chest imaging reviewed, no aortic or coronary artery calcification described. DATE TEST EF   3/09 Echo   65 %   5/14 Myoview   75 % No ischemia        Date Cr K TSH LDL Hgb  7//21 1.12 4.1 2.55 179 15.3  9/22 0.81 4.4 1.88 82 15.4    Thromboembolic risk factors , HTN-1, Gender-1) for a CHADSVASc Score of 2   Past Medical History:  Diagnosis Date   Anxiety state, unspecified    Back pain    Chest pain    a. 2008 Cath: reportedly nl;  b. 09/2012 Lexi MV: EF 75%, soft tissue attenuation->Low risk.   COPD (chronic obstructive pulmonary disease) (HCC)    CTS (carpal tunnel syndrome)    Depression    GERD (gastroesophageal reflux disease)    Migraines    Neck pain     Nontoxic multinodular goiter    Other and unspecified hyperlipidemia    Other and unspecified ovarian cyst    left   Other chest pain    PAF (paroxysmal atrial fibrillation) (HCC)    a. 07/2007 Echo: EF 65%, mildly dil LA;  b. currently on propafenone;  c. CHA2DS2VASc = 1 (gender)-->No anticoagulation.   PTSD (post-traumatic stress disorder)    Tobacco use    Tobacco use disorder    Unspecified hypothyroidism    Unspecified sleep apnea    resolved s/p UPPP surgery   Vitamin B12 deficiency       Surgical History:  Past Surgical History:  Procedure Laterality Date   bladder tac     CARDIAC CATHETERIZATION  08/12/06   neg. Dr. Welton Flakes, cardio   CHOLECYSTECTOMY     FOOT SURGERY     bone removal from her foot   MRI lumber spine     03/12/06-abn   MRI pelvis  05/02/06   abn-[facility], neurosurg   NASAL POLYP SURGERY     nasal ?   PARATHYROIDECTOMY     stress cardiolite  10/06/96   nl, EF 62%   stress myoview  10/25/03   Dr. Park Breed, nl   THYROIDECTOMY  5/92   massive goiter   TONSILLECTOMY     TUBAL LIGATION  1988  UVULOPALATOPHARYNGOPLASTY     and tonsilectomy. [name] 10/04     Home Meds: Current Meds  Medication Sig   atorvastatin (LIPITOR) 40 MG tablet Take 1 tablet (40 mg total) by mouth daily.   citalopram (CELEXA) 40 MG tablet Take 1.5 tablets (60 mg total) by mouth daily.   levothyroxine (SYNTHROID) 125 MCG tablet Take 1 tablet (125 mcg total) by mouth daily with breakfast.   pantoprazole (PROTONIX) 20 MG tablet Take 1 tablet (20 mg total) by mouth daily.   propafenone (RYTHMOL) 225 MG tablet TAKE ONE TABLET BY MOUTH DAILY AS NEEDED FOR AFIB    Allergies:  Allergies  Allergen Reactions   Bee Venom Anaphylaxis   Penicillins Rash   Sulfonamide Derivatives Rash   Cardizem  [Diltiazem Hcl] Other (See Comments)    Other Reaction: severe hypotension w/ IV dose   Sulfa Antibiotics Hives   Tape Rash    Uncoded Allergy. Allergen: tape & bandaids          ROS:   Please see the history of present illness.     All other systems reviewed and negative.   BP 120/74 (BP Location: Left Arm, Patient Position: Sitting, Cuff Size: Normal)   Pulse 66   Wt 201 lb (91.2 kg)   SpO2 94%   BMI 35.61 kg/m   Well developed and nourished in no acute distress HENT normal Neck supple with JVP-6-7 cm Clear Regular rate and rhythm, no murmurs or gallops Abd-soft with active BS No Clubbing cyanosis trace edema Skin-warm and dry A & Oriented  Grossly normal sensory and motor function  ECG sinus at 66 Interval 14/09/47 Isolated PVC Left axis deviation Inferior Q waves no different from 10/21  Assessment and Plan:   Atrial fibrillation paroxysmal/regular S tachypalpitations  Elevated blood pressure  Hypercholesterolemia  Dyspnea on exertion  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  Lymphocytosis-resolved   Infrequent recurrent palpitations.  The regular palpitations she continues to be able to terminate with Valsalva.  We discussed AliveCor last time tried to elucidate a potential primary arrhythmia for which the atrial fibrillation was secondary.  She did not get that, at this point her symptoms are sufficiently infrequent and manageable that she will continue on her current course.  She has Rythmol to use as needed.  Her dyspnea on exertion is her biggest complaint.  This has been progressive.  Particularly in the temporal sequence to her COVID.  We will check a chest x-ray, as well as pulmonary function testing.    Mildly volume overloaded.  We will check an echocardiogram.  We will also put her on furosemide 20 mg empirically for about a week to see if we can make any impact on her dyspnea.    Sherryl Manges

## 2021-11-02 NOTE — Patient Instructions (Signed)
Medication Instructions:  - Your physician has recommended you make the following change in your medication:   1) START lasix (furosemide) 20 mg: - take 1 tablet by mouth once daily x 7 days  *If you need a refill on your cardiac medications before your next appointment, please call your pharmacy*   Lab Work: - none ordered  If you have labs (blood work) drawn today and your tests are completely normal, you will receive your results only by: MyChart Message (if you have MyChart) OR A paper copy in the mail If you have any lab test that is abnormal or we need to change your treatment, we will call you to review the results.   Testing/Procedures:  1) Chest X-ray: - A chest x-ray takes a picture of the organs and structures inside the chest, including the heart, lungs, and blood vessels. This test can show several things, including, whether the heart is enlarges; whether fluid is building up in the lungs; and whether pacemaker / defibrillator leads are still in place.   [facility] En[facility] at [facility] 1st desk on the right to check in (REGISTRATION)  Monday- Friday (7:30 am- 5:30 pm)   2) Echocardiogram: - Your physician has requested that you have an echocardiogram. Echocardiography is a painless test that uses sound waves to create images of your heart. It provides your doctor with information about the size and shape of your heart and how well your heart's chambers and valves are working. This procedure takes approximately one hour. There are no restrictions for this procedure.    3) Pulmonary Function Tests: - Your physician has recommended that you have a pulmonary function test. Pulmonary Function Tests are a group of tests that measure how well air moves in and out of your lungs.    Follow-Up: At [facility], you and your health needs are our priority.  As part of our continuing mission to provide you with exceptional heart care, we have created designated Provider Care  Teams.  These Care Teams include your primary Cardiologist (physician) and Advanced Practice Providers (APPs -  Physician Assistants and Nurse Practitioners) who all work together to provide you with the care you need, when you need it.  We recommend signing up for the patient portal called "MyChart".  Sign up information is provided on this After Visit Summary.  MyChart is used to connect with patients for Virtual Visits (Telemedicine).  Patients are able to view lab/test results, encounter notes, upcoming appointments, etc.  Non-urgent messages can be sent to your provider as well.   To learn more about what you can do with MyChart, go to [email or web address].    Your next appointment:   6 month(s)  The format for your next appointment:   In Person  Provider:   Sherryl Manges, MD    Other Instructions  Echocardiogram An echocardiogram is a test that uses sound waves (ultrasound) to produce images of the heart. Images from an echocardiogram can provide important information about: Heart size and shape. The size and thickness and movement of your heart's walls. Heart muscle function and strength. Heart valve function or if you have stenosis. Stenosis is when the heart valves are too narrow. If blood is flowing backward through the heart valves (regurgitation). A tumor or infectious growth around the heart valves. Areas of heart muscle that are not working well because of poor blood flow or injury from a heart attack. Aneurysm detection. An aneurysm is a weak or damaged part of an  artery wall. The wall bulges out from the normal force of blood pumping through the body. Tell a health care provider about: Any allergies you have. All medicines you are taking, including vitamins, herbs, eye drops, creams, and over-the-counter medicines. Any blood disorders you have. Any surgeries you have had. Any medical conditions you have. Whether you are pregnant or may be pregnant. What are the  risks? Generally, this is a safe test. However, problems may occur, including an allergic reaction to dye (contrast) that may be used during the test. What happens before the test? No specific preparation is needed. You may eat and drink normally. What happens during the test?  You will take off your clothes from the waist up and put on a hospital gown. Electrodes or electrocardiogram (ECG)patches may be placed on your chest. The electrodes or patches are then connected to a device that monitors your heart rate and rhythm. You will lie down on a table for an ultrasound exam. A gel will be applied to your chest to help sound waves pass through your skin. A handheld device, called a transducer, will be pressed against your chest and moved over your heart. The transducer produces sound waves that travel to your heart and bounce back (or "echo" back) to the transducer. These sound waves will be captured in real-time and changed into images of your heart that can be viewed on a video monitor. The images will be recorded on a computer and reviewed by your health care provider. You may be asked to change positions or hold your breath for a short time. This makes it easier to get different views or better views of your heart. In some cases, you may receive contrast through an IV in one of your veins. This can improve the quality of the pictures from your heart. The procedure may vary among health care providers and hospitals. What can I expect after the test? You may return to your normal, everyday life, including diet, activities, and medicines, unless your health care provider tells you not to do that. Follow these instructions at home: It is up to you to get the results of your test. Ask your health care provider, or the department that is doing the test, when your results will be ready. Keep all follow-up visits. This is important. Summary An echocardiogram is a test that uses sound waves (ultrasound)  to produce images of the heart. Images from an echocardiogram can provide important information about the size and shape of your heart, heart muscle function, heart valve function, and other possible heart problems. You do not need to do anything to prepare before this test. You may eat and drink normally. After the echocardiogram is completed, you may return to your normal, everyday life, unless your health care provider tells you not to do that. This information is not intended to replace advice given to you by your health care provider. Make sure you discuss any questions you have with your health care provider. Document Revised: 01/04/2021 Document Reviewed: 12/15/2019 Elsevier Patient Education  2023 Elsevier Inc.   Pulmonary Function Tests Pulmonary function tests (PFTs) are breathing tests that are used to measure how well your lungs work, find out what is causing your lung problems, and figure out the best treatment for you. You may have PFTs: When you have an illness involving your lungs. To watch for changes in your lung function over time if you have a long-term (chronic) lung disease. If you are an Training and development officer  Financial controller. PFTs check the effects of being exposed to chemicals over a long period of time. To check for diseases that affect the lungs, such as asthma or chronic obstructive pulmonary disease (COPD). To check lung function before having surgery or other procedures. To check your lungs, if you smoke. To check if prescribed medicines or treatments are helping your lungs. Your results will be compared with the expected lung function of someone with healthy lungs who is similar to you in several ways. These include age, sex, height, weight, and race or ethnicity. This is done to show how your lung function compares with normal lung function (percent predicted). The percent predicted helps your health care provider to know if your lung function is normal or not. If you have had  PFTs done before, your health care provider will compare your current results with past results. This shows if your lung function is better, worse, or the same as before. Tell a health care provider about: Any allergies you have. All medicines you are taking, including inhaler or nebulizer medicines, vitamins, herbs, eye drops, creams, and over-the-counter medicines. Any blood disorders you have. Any surgeries you have had, especially recent surgery of the eye, abdomen, or chest. These can make PFTs difficult or unsafe. Any medical conditions you have, including chest pain or heart problems, tuberculosis, or respiratory infections such as pneumonia, a cold, or the flu. Any fear of being in closed spaces (claustrophobia). Some of your tests may be in a closed space. What are the risks? Generally, this is a safe procedure. However, problems may occur, including: Feeling light-headed due to fast, deep breathing known as over-breathing or hyperventilation. An asthma attack from deep breathing. What happens before the procedure? Take over-the-counter and prescription medicines only as told by your health care provider. If you take inhaler or nebulizer medicines, ask your health care provider which medicines you should take on the day of your testing. Some inhaler medicines may interfere with PFTs if they are taken shortly before the tests. Follow instructions from your health care provider about eating or drinking restrictions. These may include avoiding eating large meals and avoiding using caffeine before the testing. Do not drink alcohol for up to 4 hours before the test. Do not smoke for up to 4 hours before your procedure. This includes e-cigarettes. Wear comfortable clothing that will not interfere with breathing. Avoid exercise that takes a lot of effort (strenuous exercise) for at least 30 minutes before the testing. What happens during the procedure?  You will be given a soft nose clip to  wear. This is done so all of your breaths will go through your mouth instead of your nose. You will be given a germ-free (sterile) mouthpiece. It will be attached to a machine, called a spirometer, that measures your breathing. You will be asked to do various breathing exercises. The exercises will be done by breathing in (inhaling) and breathing out (exhaling). You may be asked to repeat the exercises several times before the testing is complete. It is important to follow the instructions exactly to get accurate results. Make sure to blow as hard and as fast as you can when you are told to do so. You may be given a medicine called a bronchodilator that makes the small air passages in your lungs larger. This medicine will make it easier for you to breathe. The tests will then be repeated after the medicine takes effect. You will be watched carefully during the procedure for any problems,  such as feeling faint or dizzy, or having trouble breathing. The procedure may vary among health care providers and hospitals. What can I expect after the test procedure? It is up to you to get the results of your procedure. Ask your health care provider, or the department that is doing the procedure, when your results will be ready. After you have received your results, talk with your health care provider about treatment options, if necessary. Follow these instructions at home: Do not use any products that contain nicotine or tobacco, such as cigarettes, e-cigarettes, and chewing tobacco. If you need help quitting, ask your health care provider. Summary Pulmonary function tests (PFTs) are used to measure how well your lungs work, find out what is causing your lung problems, and figure out the best treatment for you. If you have had PFTs done before, your health care provider will compare your current results with past results. This shows if your lung function is better, worse, or the same as before. Wear comfortable  clothing that will not interfere with breathing when you are tested. It is up to you to get the results of your procedure. After you have received them, talk with your health care provider about treatment options, if necessary. This information is not intended to replace advice given to you by your health care provider. Make sure you discuss any questions you have with your health care provider. Document Revised: 01/04/2021 Document Reviewed: 04/21/2019 Elsevier Patient Education  2023 Elsevier Inc.   Important Information About Sugar

## 2021-11-03 DIAGNOSIS — J449 Chronic obstructive pulmonary disease, unspecified: Secondary | ICD-10-CM

## 2021-11-03 DIAGNOSIS — I4891 Unspecified atrial fibrillation: Secondary | ICD-10-CM

## 2021-11-03 DIAGNOSIS — F32A Depression, unspecified: Secondary | ICD-10-CM

## 2021-11-03 DIAGNOSIS — F17210 Nicotine dependence, cigarettes, uncomplicated: Secondary | ICD-10-CM

## 2021-11-03 DIAGNOSIS — E785 Hyperlipidemia, unspecified: Secondary | ICD-10-CM | POA: Diagnosis not present

## 2021-11-06 ENCOUNTER — Ambulatory Visit: Payer: BC Managed Care – PPO | Admitting: Licensed Clinical Social Worker

## 2021-11-06 DIAGNOSIS — Z5900 Homelessness unspecified: Secondary | ICD-10-CM

## 2021-11-06 DIAGNOSIS — I480 Paroxysmal atrial fibrillation: Secondary | ICD-10-CM

## 2021-11-06 DIAGNOSIS — J449 Chronic obstructive pulmonary disease, unspecified: Secondary | ICD-10-CM

## 2021-11-06 DIAGNOSIS — N1831 Chronic kidney disease, stage 3a: Secondary | ICD-10-CM

## 2021-11-06 DIAGNOSIS — F411 Generalized anxiety disorder: Secondary | ICD-10-CM

## 2021-11-06 DIAGNOSIS — F331 Major depressive disorder, recurrent, moderate: Secondary | ICD-10-CM

## 2021-11-06 NOTE — Chronic Care Management (AMB) (Signed)
Chronic Care Management    Clinical Social Work Note  11/06/2021 Name: [name] MRN: [id number] DOB: Sep 07, 1955  [name] is a 66 y.o. year old female who is a primary care patient of Dorcas Carrow, DO. The CCM team was consulted to assist the patient with chronic disease management and/or care coordination needs related to: [employer]  and Mental Health Counseling and Resources.   Engaged with patient by telephone for follow up visit in response to provider referral for social work chronic care management and care coordination services.   Consent to Services:  The patient was given information about Chronic Care Management services, agreed to services, and gave verbal consent prior to initiation of services.  Please see initial visit note for detailed documentation.   Patient agreed to services and consent obtained.   Assessment: Review of patient past medical history, allergies, medications, and health status, including review of relevant consultants reports was performed today as part of a comprehensive evaluation and provision of chronic care management and care coordination services.     SDOH (Social Determinants of Health) assessments and interventions performed:    Advanced Directives Status: Not addressed in this encounter.  CCM Care Plan  Allergies  Allergen Reactions   Bee Venom Anaphylaxis   Penicillins Rash   Sulfonamide Derivatives Rash   Cardizem  [Diltiazem Hcl] Other (See Comments)    Other Reaction: severe hypotension w/ IV dose   Sulfa Antibiotics Hives   Tape Rash    Uncoded Allergy. Allergen: tape & bandaids    Outpatient Encounter Medications as of 11/06/2021  Medication Sig   atorvastatin (LIPITOR) 40 MG tablet Take 1 tablet (40 mg total) by mouth daily.   citalopram (CELEXA) 40 MG tablet Take 1.5 tablets (60 mg total) by mouth daily.   furosemide (LASIX) 20 MG tablet Take 1 tablet (20 mg) by mouth once daily x 7 days   levothyroxine  (SYNTHROID) 125 MCG tablet Take 1 tablet (125 mcg total) by mouth daily with breakfast.   pantoprazole (PROTONIX) 20 MG tablet Take 1 tablet (20 mg total) by mouth daily.   propafenone (RYTHMOL) 225 MG tablet TAKE ONE TABLET BY MOUTH DAILY AS NEEDED FOR AFIB   No facility-administered encounter medications on file as of 11/06/2021.    Patient Active Problem List   Diagnosis Date Noted   Homelessness 09/22/2021   Advance directive discussed with patient 01/30/2021   Scoliosis 08/02/2015   Chronic fatigue 02/06/2015   CKD (chronic kidney disease) stage 3, GFR 30-59 ml/min (HCC) 02/06/2015   Nasal polyp, posterior 02/03/2015   Muscle spasms of head and/or neck 11/11/2014   Neck pain of over 3 months duration 10/21/2014   GERD (gastroesophageal reflux disease)    Hypothyroidism    Migraines    Depression, major, recurrent, moderate (HCC) 07/20/2014    Class: Chronic   OSA (obstructive sleep apnea) 02/15/2014   Atrial fibrillation (HCC) 08/29/2012   Dyspnea on exertion 08/29/2012   DEGENERATIVE JOINT DISEASE 02/25/2008   Hyperlipidemia 01/29/2007   COPD (chronic obstructive pulmonary disease) (HCC) 01/29/2007   Anxiety state 01/28/2007   Tobacco abuse 01/28/2007   OVARIAN CYST, LEFT 05/14/2006    Conditions to be addressed/monitored: Atrial Fibrillation, COPD, CKD Stage 3, Anxiety, and Depression; Housing barriers  Care Plan : LCSW Plan of Care  Updates made by Bridgett Larsson, LCSW since 11/06/2021 12:00 AM     Problem: Barriers to Treatment      Long-Range Goal: Barriers to Treatment  Identified and Managed   Start Date: 10/09/2021  Expected End Date: 03/06/2022  This Visit's Progress: On track  Recent Progress: On track  Priority: High  Note:   Current Barriers:  Limited social support, Housing barriers, and Mental Health Concerns    CSW Clinical Goal(s):  Patient  will demonstrate a reduction in symptoms related to :Depression: anxiety  explore community resource  options for unmet needs related to:  Housing  through collaboration with Visual merchandiser, provider, and care team.   Interventions: Patient verified address and should recieve supportive housing resources this week Patient reports a positive appointment with Cardiologist noting current tx plan decreases stress and anxiety regarding management of a fib Patient endorsed frustration that recent payments to Financial Department weren't reflected on her account, resulting in referral to collections. Validation and encouragement was provided. Patient was provided contact # and encouraged to contact Financial Dept CCM LCSW discussed healthy coping skills to assist with stress management. Encouraged self-care Patient reports compliance with medication management 1:1 collaboration with primary care provider regarding development and update of comprehensive plan of care as evidenced by provider attestation and co-signature Inter-disciplinary care team collaboration (see longitudinal plan of care) Evaluation of current treatment plan related to  self management and patient's adherence to plan as established by provider Review resources, discussed options and provided patient information about  Housing resources ([employer]) Referral to Care Guide completed Solution-Focused Strategies employed:  Active listening / Reflection utilized  Emotional Support Provided Verbalization of feelings Patent examiner / information provided    Task & activities to accomplish goals: Attend provider appointments Utilize resources and/or healthy coping skills discussed Contact PCP office with any questions or concerns       Follow Up Plan: SW will follow up with patient by phone over the next 4-6 weeks      Jenel Lucks, MSW, LCSW [facility] Care Management [place]  [facility] [email or web address] Phone [phone] 12:59  PM

## 2021-11-06 NOTE — Patient Instructions (Signed)
Visit Information  Thank you for taking time to visit with me today. Please don't hesitate to contact me if I can be of assistance to you before our next scheduled telephone appointment.  Following are the goals we discussed today:  Task & activities to accomplish goals: Attend provider appointments Utilize resources and/or healthy coping skills discussed Contact PCP office with any questions or concerns  Our next appointment is by telephone on 12/04/21 at 10:45 AM  Please call the care guide team at [phone] if you need to cancel or reschedule your appointment.   If you are experiencing a Mental Health or Behavioral Health Crisis or need someone to talk to, please call the Suicide and Crisis Lifeline: 988 call 911   Patient verbalizes understanding of instructions and care plan provided today and agrees to view in MyChart. Active MyChart status and patient understanding of how to access instructions and care plan via MyChart confirmed with patient.     Jenel Lucks, MSW, LCSW [facility] Care Management [place]  [facility] [email or web address] Phone [phone] 1:01 PM

## 2021-11-13 ENCOUNTER — Telehealth: Payer: Self-pay

## 2021-11-13 NOTE — Telephone Encounter (Signed)
   Telephone encounter was:  Successful.  11/13/2021 Name: [name] MRN: [id number] DOB: Jun 04, 1955  [name] is a 66 y.o. year old female who is a primary care patient of Dorcas Carrow, DO . The community resource team was consulted for assistance with  housing  Care guide performed the following interventions: Patient provided with information about care guide support team and interviewed to confirm resource needs.Patient has moved to [facility] I have mailed resources to her PO B[address]  Follow Up Plan:  Care guide will follow up with patient by phone over the next two weeks    [facility] Guide, Embedded Care Coordination [facility], Care Management  [phone] [address] [place], [place], [place] [address] Phone: [phone] Email: [name]

## 2021-11-24 ENCOUNTER — Telehealth: Payer: Self-pay

## 2021-11-24 NOTE — Telephone Encounter (Signed)
   Telephone encounter was:  Unsuccessful.  11/24/2021 Name: [name] MRN: [id number] DOB: 10-12-1955  Unsuccessful outbound call made today to assist with:   HOUSING  Outreach Attempt:  3rd Attempt.  Referral closed unable to contact patient.  A HIPAA compliant voice message was left requesting a return call.  Instructed patient to call back at earliest convenience. Lenard Forth Care Guide, Embedded Care Coordination [facility], Care Management  [phone] [address] [place], [place], [place] [address] Phone: [phone] Email: [email or web address]

## 2021-11-30 ENCOUNTER — Telehealth: Payer: Self-pay | Admitting: *Deleted

## 2021-11-30 ENCOUNTER — Other Ambulatory Visit: Payer: Self-pay | Admitting: *Deleted

## 2021-11-30 DIAGNOSIS — R0600 Dyspnea, unspecified: Secondary | ICD-10-CM

## 2021-11-30 NOTE — Telephone Encounter (Signed)
-----   Message from Jefferey Pica, RN sent at 11/03/2021  4:16 PM EDT ----- PFT schedule not out for 8/1 as of 6/30. Call scheduling back to see if August schedule is available that we can coordinate a PFT on 8/1 when she is coming for her echo (1 pm - our office).   Update the patient.

## 2021-11-30 NOTE — Telephone Encounter (Signed)
I called and spoke with [facility] scheduling. The PFT's schedule was just released this afternoon. The lab is closed 81-12/08/21.  Availability for PFT's is: 8/8-8/10: @ 1,2, or 3 pm 8/14-8/18: 1 & 2 pm 8/22-8/25: 1, 2, or 3 pm 8/28-8/31: 1, 2, or 3 pm  I advised I would try to get the patient's echo r/s to one of these available dates and call back to r/s.  Our scheduling team was able to r/s the patient's echo to 12/26/21 at 3 pm. They have confirmed this with the patient and advised she could have her PFT at 1pm that day.   I have notified scheduling to please r/s the PFT to 8/22 at 1:00 pm. Per scheduling the patient should: - arrive at 12:45 pm - no smoking, caffeine, or inhalers the morning of the test  I will call/ my chart the patient and notify her of the above pre test instructions.

## 2021-12-01 ENCOUNTER — Telehealth: Payer: Self-pay

## 2021-12-01 NOTE — Chronic Care Management (AMB) (Signed)
  Chronic Care Management Note  12/01/2021 Name: [name] MRN: [id number] DOB: 05/14/1955  [name] is a 66 y.o. year old female who is a primary care patient of Dorcas Carrow, DO and is actively engaged with the care management team. I reached out to [name] by phone today to assist with re-scheduling a follow up visit with the Licensed Clinical Social Worker  Follow up plan: Unsuccessful telephone outreach attempt made. A HIPAA compliant phone message was left for the patient providing contact information and requesting a return call.  The care management team will reach out to the patient again over the next 7 days.  If patient returns call to provider office, please advise to call Embedded Care Management Care Guide Penne Lash  at [phone]  Penne Lash, RMA Care Guide [facility]  [place], [place] [address] Direct Dial: [phone] [email or web address]

## 2021-12-04 ENCOUNTER — Telehealth: Payer: Medicare Other

## 2021-12-04 DIAGNOSIS — J449 Chronic obstructive pulmonary disease, unspecified: Secondary | ICD-10-CM

## 2021-12-04 DIAGNOSIS — F331 Major depressive disorder, recurrent, moderate: Secondary | ICD-10-CM

## 2021-12-04 DIAGNOSIS — N1831 Chronic kidney disease, stage 3a: Secondary | ICD-10-CM | POA: Diagnosis not present

## 2021-12-04 DIAGNOSIS — I480 Paroxysmal atrial fibrillation: Secondary | ICD-10-CM | POA: Diagnosis not present

## 2021-12-05 ENCOUNTER — Other Ambulatory Visit: Payer: Medicare Other

## 2021-12-05 NOTE — Telephone Encounter (Signed)
PFT instructions/ date/ time of appointment sent to the patient's mychart.

## 2021-12-14 ENCOUNTER — Other Ambulatory Visit: Payer: Self-pay | Admitting: Internal Medicine

## 2021-12-26 ENCOUNTER — Ambulatory Visit (INDEPENDENT_AMBULATORY_CARE_PROVIDER_SITE_OTHER): Payer: BC Managed Care – PPO

## 2021-12-26 ENCOUNTER — Ambulatory Visit: Payer: BC Managed Care – PPO | Attending: Internal Medicine

## 2021-12-26 DIAGNOSIS — R0600 Dyspnea, unspecified: Secondary | ICD-10-CM | POA: Insufficient documentation

## 2021-12-26 LAB — ECHOCARDIOGRAM COMPLETE
AR max vel: 2.33 cm2
AV Area VTI: 2.53 cm2
AV Area mean vel: 2.41 cm2
AV Mean grad: 3 mmHg
AV Peak grad: 6.1 mmHg
Ao pk vel: 1.23 m/s
Area-P 1/2: 3.2 cm2
Calc EF: 61.5 %
Single Plane A2C EF: 57.2 %
Single Plane A4C EF: 65.9 %

## 2021-12-26 MED ORDER — ALBUTEROL SULFATE (2.5 MG/3ML) 0.083% IN NEBU: 2.5000 mg | INHALATION_SOLUTION | Freq: Once | RESPIRATORY_TRACT | Status: AC

## 2021-12-27 ENCOUNTER — Telehealth: Payer: Self-pay

## 2021-12-27 ENCOUNTER — Telehealth: Payer: Medicare Other

## 2021-12-27 NOTE — Telephone Encounter (Signed)
  Care Management   Follow Up Note   12/27/2021 Name: [name] MRN: [id number] DOB: 1955-10-15   Referred by: Dorcas Carrow, DO Reason for referral : Chronic Care Management (RNCM: Follow up for Chronic Disease Management and Care Coordination Needs )   An unsuccessful telephone outreach was attempted today. The patient was referred to the case management team for assistance with care management and care coordination.   Follow Up Plan: No further follow up required: the patient unable to reach and the care plan has been closed.   Alto Denver RN, MSN, CCM Community Care Coordinator [place]  [facility] Mobile: [phone]

## 2022-01-01 ENCOUNTER — Ambulatory Visit: Payer: Self-pay

## 2022-01-01 ENCOUNTER — Ambulatory Visit: Payer: Self-pay | Admitting: *Deleted

## 2022-01-01 NOTE — Patient Outreach (Signed)
  Care Coordination   Follow Up Visit Note   01/01/2022 Name: MATALYNN GRAFF MRN: [id number] DOB: 09-08-1955  Kristian Covey Pineau is a 66 y.o. year old female who sees Dorcas Carrow, DO for primary care. I spoke with  [name] by phone today.  What matters to the patients health and wellness today?  Getting in home sleep study and managing her health and well being. The patient is still homeless but staying with a friend at this time. She states this is not ideal but she is thankful her friend is allowing her to stay there for now.     Goals Addressed             This Visit's Progress    COMPLETED: RNCM: Effective Managment of Sleep Apnea       Care Coordination Interventions: Evaluation of current treatment plan related to OsA and obtaining a sleep apnea and patient's adherence to plan as established by provider Advised patient to call the insurance company and inquire if it will cover her in home sleep study Provided education to patient re: resources available and working with the LCSW for assistance with housing resources and other resources that will help the patient meet her needs Collaborated with LCSW  regarding Medicare Questions and expressed needs. Spoke with the LCSW via phone for and an appointment set up with the patient to talk to LCSW this afternoon at 3 pm Reviewed scheduled/upcoming provider appointments including 03-26-2022 with pcp Discussed plans with patient for ongoing care management follow up and provided patient with direct contact information for care management team Advised patient to discuss call the provider for changes or questions related to OSA or other changes in chronic conditions with provider Screening for signs and symptoms of depression related to chronic disease state  Assessed social determinant of health barriers 01-01-2022: Reviewed with the patient the message sent by CMA from [facility] concerning mammogram. The patient declines scheduling for  mammogram at this time and will discuss with pcp at her visit in November.           SDOH assessments and interventions completed:  Yes  SDOH Interventions Today    Flowsheet Row Most Recent Value  SDOH Interventions   Physical Activity Interventions Other (Comments)  [has a lot of back pain, encouraged increased activity]  Stress Interventions Other (Comment)  [living with a friend right now]  Transportation Interventions Intervention Not Indicated        Care Coordination Interventions Activated:  Yes  Care Coordination Interventions:  Yes, provided   Follow up plan: No further intervention required.   Encounter Outcome:  Pt. Visit Completed    Alto Denver RN, MSN, CCM Community Care Coordinator [facility]  [facility] Mobile: [phone]

## 2022-01-01 NOTE — Patient Outreach (Signed)
  Care Coordination   Initial Visit Note   01/01/2022 Name: [name] MRN: [id number] DOB: 02-Sep-1955  [name] is a 66 y.o. year old female who sees [name] Carrow, DO for primary care. I spoke with  [name] by phone today. What matters to the patients health and wellness today?  Billing questions regarding Medicare coverage   Goals Addressed             This Visit's Progress    I have questions about my medicar coverage       Care Coordination Interventions:  Patient had questions regarding her Medicare coverage stating that she has commercial insurance as well as Medicare but it seems that following office visits, the commercial insurance is only billed This social worker provided patient with the contact number to the [employer] [phone] and to Medicare [phone] to call to address her billing question. Patient agreeable to calling the above contacts to follow up regarding her billing questions        SDOH assessments and interventions completed:  No     Care Coordination Interventions Activated:  Yes  Care Coordination Interventions:  Yes, provided   Follow up plan: No further intervention required.   Encounter Outcome:  Pt. Visit Completed

## 2022-01-01 NOTE — Patient Instructions (Signed)
Visit Information  Thank you for taking time to visit with me today. Please don't hesitate to contact me if I can be of assistance to you.   Following are the goals we discussed today:   Goals Addressed             This Visit's Progress    I have questions about my medicar coverage       Care Coordination Interventions:  Patient had questions regarding her Medicare coverage stating that she has commercial insurance as well as Medicare but it seems that following office visits, the commercial insurance is only billed This social worker provided patient with the contact number to the [employer] [phone] and to Medicare [phone] to call to address her billing question. Patient agreeable to calling the above contacts to follow up regarding her billing questions         Please call the care guide team at [phone] if you need to cancel or reschedule your appointment.   If you are experiencing a Mental Health or Behavioral Health Crisis or need someone to talk to, please call the Suicide and Crisis Lifeline: 988   Patient verbalizes understanding of instructions and care plan provided today and agrees to view in MyChart. Active MyChart status and patient understanding of how to access instructions and care plan via MyChart confirmed with patient.     No further follow up required:   Verna Czech, LCSW Clinical Social Worker  [facility] Care Management [phone]

## 2022-01-01 NOTE — Patient Instructions (Signed)
Visit Information  Thank you for taking time to visit with me today. Please don't hesitate to contact me if I can be of assistance to you.   Following are the goals we discussed today:   Goals Addressed             This Visit's Progress    COMPLETED: RNCM: Effective Managment of Sleep Apnea       Care Coordination Interventions: Evaluation of current treatment plan related to OsA and obtaining a sleep apnea and patient's adherence to plan as established by provider Advised patient to call the insurance company and inquire if it will cover her in home sleep study Provided education to patient re: resources available and working with the LCSW for assistance with housing resources and other resources that will help the patient meet her needs Collaborated with LCSW  regarding Medicare Questions and expressed needs. Spoke with the LCSW via phone for and an appointment set up with the patient to talk to LCSW this afternoon at 3 pm Reviewed scheduled/upcoming provider appointments including 03-26-2022 with pcp Discussed plans with patient for ongoing care management follow up and provided patient with direct contact information for care management team Advised patient to discuss call the provider for changes or questions related to OSA or other changes in chronic conditions with provider Screening for signs and symptoms of depression related to chronic disease state  Assessed social determinant of health barriers 01-01-2022: Reviewed with the patient the message sent by CMA from [facility] concerning mammogram. The patient declines scheduling for mammogram at this time and will discuss with pcp at her visit in November.            Please call the care guide team at [phone] if you need to schedule an appointment.   If you are experiencing a Mental Health or Behavioral Health Crisis or need someone to talk to, please call the Suicide and Crisis Lifeline: 988 call the [place] National Suicide  Prevention Lifeline: 9052632246 or TTY: (913)560-9359 TTY 801-586-1903) to talk to a trained counselor call 1-800-273-TALK (toll free, 24 hour hotline)  Patient verbalizes understanding of instructions and care plan provided today and agrees to view in MyChart. Active MyChart status and patient understanding of how to access instructions and care plan via MyChart confirmed with patient.     No further follow up required: the patient knows to call the [facility] for further questions or concerns  Alto Denver RN, MSN, CCM [facility] Coordinator [facility]  [facility] Mobile: [phone]  Living With Sleep Apnea Sleep apnea is a condition in which breathing pauses or becomes shallow during sleep. Sleep apnea is most commonly caused by a collapsed or blocked airway. People with sleep apnea usually snore loudly. They may have times when they gasp and stop breathing for 10 seconds or more during sleep. This may happen many times during the night. The breaks in breathing also interrupt the deep sleep that you need to feel rested. Even if you do not completely wake up from the gaps in breathing, your sleep may not be restful and you feel tired during the day. You may also have a headache in the morning and low energy during the day, and you may feel anxious or depressed. How can sleep apnea affect me? Sleep apnea increases your chances of extreme tiredness during the day (daytime fatigue). It can also increase your risk for health conditions, such as: Heart attack. Stroke. Obesity. Type 2 diabetes. Heart failure. Irregular heartbeat.  High blood pressure. If you have daytime fatigue as a result of sleep apnea, you may be more likely to: Perform poorly at school or work. Fall asleep while driving. Have difficulty with attention. Develop depression or anxiety. Have sexual dysfunction. What actions can I take to manage sleep apnea? Sleep apnea treatment  If you were given a device  to open your airway while you sleep, use it only as told by your health care provider. You may be given: An oral appliance. This is a custom-made mouthpiece that shifts your lower jaw forward. A continuous positive airway pressure (CPAP) device. This device blows air through a mask when you breathe out (exhale). A nasal expiratory positive airway pressure (EPAP) device. This device has valves that you put into each nostril. A bi-level positive airway pressure (BIPAP) device. This device blows air through a mask when you breathe in (inhale) and breathe out (exhale). You may need surgery if other treatments do not work for you. Sleep habits Go to sleep and wake up at the same time every day. This helps set your internal clock (circadian rhythm) for sleeping. If you stay up later than usual, such as on weekends, try to get up in the morning within 2 hours of your normal wake time. Try to get at least 7-9 hours of sleep each night. Stop using a computer, tablet, and mobile phone a few hours before bedtime. Do not take long naps during the day. If you nap, limit it to 30 minutes. Have a relaxing bedtime routine. Reading or listening to music may relax you and help you sleep. Use your bedroom only for sleep. Keep your television and computer out of your bedroom. Keep your bedroom cool, dark, and quiet. Use a supportive mattress and pillows. Follow your health care provider's instructions for other changes to sleep habits. Nutrition Do not eat heavy meals in the evening. Do not have caffeine in the later part of the day. The effects of caffeine can last for more than 5 hours. Follow your health care provider's or dietitian's instructions for any diet changes. Lifestyle     Do not drink alcohol before bedtime. Alcohol can cause you to fall asleep at first, but then it can cause you to wake up in the middle of the night and have trouble getting back to sleep. Do not use any products that contain  nicotine or tobacco. These products include cigarettes, chewing tobacco, and vaping devices, such as e-cigarettes. If you need help quitting, ask your health care provider. Medicines Take over-the-counter and prescription medicines only as told by your health care provider. Do not use over-the-counter sleep medicine. You can become dependent on this medicine, and it can make sleep apnea worse. Do not use medicines, such as sedatives and narcotics, unless told by your health care provider. Activity Exercise on most days, but avoid exercising in the evening. Exercising near bedtime can interfere with sleeping. If possible, spend time outside every day. Natural light helps regulate your circadian rhythm. General information Lose weight if you need to, and maintain a healthy weight. Keep all follow-up visits. This is important. If you are having surgery, make sure to tell your health care provider that you have sleep apnea. You may need to bring your device with you. Where to find more information Learn more about sleep apnea and daytime fatigue from: American Sleep Association: sleepassociation.[place]: sleepfoundation.org National Heart, Lung, and Blood Institute: https://www.[place].biz/ Summary Sleep apnea is a condition in which breathing  pauses or becomes shallow during sleep. Sleep apnea can cause daytime fatigue and other serious health conditions. You may need to wear a device while sleeping to help keep your airway open. If you are having surgery, make sure to tell your health care provider that you have sleep apnea. You may need to bring your device with you. Making changes to sleep habits, diet, lifestyle, and activity can help you manage sleep apnea. This information is not intended to replace advice given to you by your health care provider. Make sure you discuss any questions you have with your health care provider. Document Revised: 11/30/2020 Document Reviewed:  04/01/2020 Elsevier Patient Education  2023 [employer].

## 2022-01-15 LAB — PULMONARY FUNCTION TEST ARMC ONLY
DL/VA % pred: 71 %
DL/VA: 2.98 ml/min/mmHg/L
DLCO unc % pred: 81 %
DLCO unc: 16.7 ml/min/mmHg
FEF 25-75 Pre: 1.91 L/sec
FEF2575-%Pred-Pre: 87 %
FEV1-%Pred-Pre: 95 %
FEV1-Pre: 2.41 L
FEV1FVC-%Pred-Pre: 95 %
FEV6-%Pred-Pre: 103 %
FEV6-Pre: 3.27 L
FEV6FVC-%Pred-Pre: 104 %
FVC-%Pred-Pre: 99 %
FVC-Pre: 3.27 L
Pre FEV1/FVC ratio: 74 %
Pre FEV6/FVC Ratio: 100 %
RV % pred: 47 %
RV: 1.02 L
TLC % pred: 86 %
TLC: 4.51 L

## 2022-01-30 ENCOUNTER — Telehealth: Payer: Self-pay | Admitting: Internal Medicine

## 2022-01-30 DIAGNOSIS — R0600 Dyspnea, unspecified: Secondary | ICD-10-CM

## 2022-01-30 NOTE — Telephone Encounter (Signed)
I spoke with the patient.  She was exceedingly frustrated as outlined in the phone note previously regarding 1-she did not have an ongoing physician relationship with Dr. Yancey Flemings and #2-frustrated that the pulmonary function testing which apparently was not providing useful information still cost her $800.  She did note that she was better with the diuretics and then that improvement waned with the discontinuation of those drugs.  I told her we would resume the furosemide at 20 mg 3 times a week.  I will discuss with pulmonary as to whether a consultation would be of benefit

## 2022-01-30 NOTE — Telephone Encounter (Signed)
Pt states she is returning a call  

## 2022-01-30 NOTE — Telephone Encounter (Signed)
Patient states that Dr Caryl Comes called her and left a voicemail message about not being able to read a pulmonary test and that she should go back to Dr Humphrey Rolls. She states that she does not go to this prescriber anymore and is adamant that she doesn't want to see him again. She's very frustrated stating that she is "paying out all this money and no one can help her" and also stating "now I don't know, is there something wrong or isn't there?" Advised I will send this back to Dr Caryl Comes and his covering RN to see if someone can call her back with advice.

## 2022-01-30 NOTE — Telephone Encounter (Signed)
Routing to Dr. Caryl Comes to advise further.

## 2022-01-31 ENCOUNTER — Other Ambulatory Visit: Payer: Self-pay | Admitting: Internal Medicine

## 2022-01-31 MED ORDER — FUROSEMIDE 20 MG PO TABS: ORAL_TABLET | ORAL | 2 refills | Status: DC

## 2022-01-31 NOTE — Telephone Encounter (Signed)
Noted- updated the patient's furosemide RX and sent this to her pharmacy so she does not run out.

## 2022-02-01 ENCOUNTER — Ambulatory Visit (INDEPENDENT_AMBULATORY_CARE_PROVIDER_SITE_OTHER): Payer: BC Managed Care – PPO

## 2022-02-01 VITALS — Ht 65.0 in | Wt 208.0 lb

## 2022-02-01 DIAGNOSIS — Z0000 Encounter for general adult medical examination without abnormal findings: Secondary | ICD-10-CM

## 2022-02-01 NOTE — Progress Notes (Signed)
I connected with  [facility] on 02/01/22 by a audio enabled telemedicine application and verified that I am speaking with the correct person using two identifiers.  Patient Location: Home  Provider Location: Office/Clinic  I discussed the limitations of evaluation and management by telemedicine. The patient expressed understanding and agreed to proceed.   Subjective:   [name] is a 66 y.o. female who presents for Medicare Annual (Subsequent) preventive examination.  Review of Systems    Defer to PCP.    Objective:    Today's Vitals   02/01/22 0831  Weight: 208 lb (94.3 kg)  Height: 5\' 5"  (1.651 m)   Body mass index is 34.61 kg/m.     12/28/2020    1:28 PM 04/28/2015    2:28 PM 02/01/2015    2:07 PM  Advanced Directives  Does Patient Have a Medical Advance Directive? No No No  Would patient like information on creating a medical advance directive? Yes (MAU/Ambulatory/Procedural Areas - Information given) No - patient declined information Yes - Educational materials given    Current Medications (verified) Outpatient Encounter Medications as of 02/01/2022  Medication Sig   atorvastatin (LIPITOR) 40 MG tablet Take 1 tablet (40 mg total) by mouth daily.   citalopram (CELEXA) 40 MG tablet Take 1.5 tablets (60 mg total) by mouth daily.   furosemide (LASIX) 20 MG tablet Take 1 tablet (20 mg total) by mouth 3 (three) times a week.   levothyroxine (SYNTHROID) 125 MCG tablet Take 1 tablet (125 mcg total) by mouth daily with breakfast.   pantoprazole (PROTONIX) 20 MG tablet Take 1 tablet (20 mg total) by mouth daily.   propafenone (RYTHMOL) 225 MG tablet TAKE 1 TABLET BY MOUTH DAILY AS NEEDED FOR AFIB   Facility-Administered Encounter Medications as of 02/01/2022  Medication   albuterol (PROVENTIL) (2.5 MG/3ML) 0.083% nebulizer solution 2.5 mg    Allergies (verified) Bee venom, Penicillins, Sulfonamide derivatives, Cardizem  [diltiazem hcl], Sulfa antibiotics, and  Tape   History: Past Medical History:  Diagnosis Date   Anxiety state, unspecified    Back pain    Chest pain    a. 2008 Cath: reportedly nl;  b. 09/2012 Lexi MV: EF 75%, soft tissue attenuation->Low risk.   COPD (chronic obstructive pulmonary disease) (HCC)    CTS (carpal tunnel syndrome)    Depression    GERD (gastroesophageal reflux disease)    Migraines    Neck pain    Nontoxic multinodular goiter    Other and unspecified hyperlipidemia    Other and unspecified ovarian cyst    left   Other chest pain    PAF (paroxysmal atrial fibrillation) ([facility])    a. 07/2007 Echo: EF 65%, mildly dil LA;  b. currently on propafenone;  c. CHA2DS2VASc = 1 (gender)-->No anticoagulation.   PTSD (post-traumatic stress disorder)    Tobacco use    Tobacco use disorder    Unspecified hypothyroidism    Unspecified sleep apnea    resolved s/p UPPP surgery   Vitamin B12 deficiency    Past Surgical History:  Procedure Laterality Date   bladder tac     CARDIAC CATHETERIZATION  08/12/06   neg. Dr. Humphrey Rolls, cardio   CHOLECYSTECTOMY     FOOT SURGERY     bone removal from her foot   MRI lumber spine     03/12/06-abn   MRI pelvis  05/02/06   abn-[facility], neurosurg   NASAL POLYP SURGERY     nasal ?   PARATHYROIDECTOMY  stress cardiolite  10/06/96   nl, EF 62%   stress myoview  10/25/03   Dr. Chancy Milroy, nl   THYROIDECTOMY  5/92   massive goiter   TONSILLECTOMY     TUBAL LIGATION  1988   UVULOPALATOPHARYNGOPLASTY     and tonsilectomy. Nadeen Landau 10/04   Family History  Problem Relation Age of Onset   Heart disease Mother    Atrial fibrillation Mother    Congestive Heart Failure Mother    Stroke Father    Atrial fibrillation Sister        s/p ablation   Breast cancer Sister 62   Hyperlipidemia Daughter    Heart disease Daughter        leaky valve   Atrial fibrillation Other    Heart attack Other        both sides   Stroke Other        both sides   Depression Other        both sides    Prostate cancer Neg Hx    Colon cancer Neg Hx    Social History   Socioeconomic History   Marital status: Widowed    Spouse name: Not on file   Number of children: Not on file   Years of education: Not on file   Highest education level: Not on file  Occupational History   Not on file  Tobacco Use   Smoking status: Former    Packs/day: 0.25    Years: 20.00    Total pack years: 5.00    Types: Cigarettes    Quit date: 09/30/2017    Years since quitting: 4.3   Smokeless tobacco: Never  Vaping Use   Vaping Use: Some days  Substance and Sexual Activity   Alcohol use: Yes    Alcohol/week: 0.0 standard drinks of alcohol    Comment: Rare   Drug use: No   Sexual activity: Not Currently    Birth control/protection: Post-menopausal  Other Topics Concern   Not on file  Social History Narrative   Widowed. 2 children, 1 granddaughter   Freight forwarder at [employer].    Social Determinants of Health   Financial Resource Strain: High Risk (02/01/2022)   Overall Financial Resource Strain (CARDIA)    Difficulty of Paying Living Expenses: Very hard  Food Insecurity: Food Insecurity Present (02/01/2022)   Hunger Vital Sign    Worried About Running Out of Food in the Last Year: Sometimes true    Ran Out of Food in the Last Year: Sometimes true  Transportation Needs: No Transportation Needs (02/01/2022)   PRAPARE - Hydrologist (Medical): No    Lack of Transportation (Non-Medical): No  Physical Activity: Insufficiently Active (02/01/2022)   Exercise Vital Sign    Days of Exercise per Week: 3 days    Minutes of Exercise per Session: 40 min  Stress: No Stress Concern Present (02/01/2022)   [place]    Feeling of Stress : Not at all  Recent Concern: Stress - Stress Concern Present (01/01/2022)   [place]    Feeling of Stress : To some  extent  Social Connections: Moderately Isolated (02/01/2022)   Social Connection and Isolation Panel [NHANES]    Frequency of Communication with Friends and Family: Twice a week    Frequency of Social Gatherings with Friends and Family: More than three times a week    Attends  Religious Services: More than 4 times per year    Active Member of Clubs or Organizations: No    Attends Archivist Meetings: Never    Marital Status: Widowed    Tobacco Counseling Counseling given: Not Answered   Clinical Intake:  Pre-visit preparation completed: Yes  Pain : No/denies pain  Diabetes: No  How often do you need to have someone help you when you read instructions, pamphlets, or other written materials from your doctor or pharmacy?: 1 - Never What is the last grade level you completed in school?: Associate's degree  Diabetic- No  Interpreter Needed?: No    Activities of Daily Living    02/01/2022    8:45 AM 01/28/2022    8:44 AM  In your present state of health, do you have any difficulty performing the following activities:  Hearing? 0 0  Vision? 0 1  Difficulty concentrating or making decisions? 0 0  Walking or climbing stairs? 1 1  Dressing or bathing? 0 0  Doing errands, shopping? 0 0  Preparing Food and eating ?  N  Using the Toilet?  N  In the past six months, have you accidently leaked urine?  N  Managing your Medications?  N  Managing your Finances?  N  Housekeeping or managing your Housekeeping?  N    Patient Care Team: Valerie Roys, DO as PCP - General (Family Medicine) Rebekah Chesterfield, LCSW as Social Worker (Licensed Clinical Social Worker)  Indicate any recent [employer] you may have received from other than [facility] providers in the past year (date may be approximate).     Assessment:   This is a routine wellness examination for [name].  Hearing/Vision screen No results found.  Dietary issues and exercise activities discussed:     Goals  Addressed   None   Depression Screen    02/01/2022    8:44 AM 10/18/2021   11:08 AM 08/10/2021    4:17 PM 01/30/2021    1:27 PM 12/13/2020    3:52 PM 09/14/2020    4:10 PM 11/22/2019   12:55 PM  PHQ 2/9 Scores  PHQ - 2 Score 0 0 0 1 0 0 1  PHQ- 9 Score 3  2 7 4 3 6     Fall Risk    02/01/2022    8:45 AM 01/28/2022    8:44 AM 01/30/2021    1:28 PM 09/14/2020    4:10 PM 12/21/2019    1:18 PM  Fall Risk   Falls in the past year? 0 0 0 0 0  Number falls in past yr: 0  0 0 0  Injury with Fall? 0  0 0 0  Risk for fall due to : No Fall Risks   No Fall Risks   Follow up Falls evaluation completed   Falls evaluation completed     [place]:  Any stairs in or around the home? Yes  If so, are there any without handrails? No  Home free of loose throw rugs in walkways, pet beds, electrical cords, etc? Yes  Adequate lighting in your home to reduce risk of falls? Yes   ASSISTIVE DEVICES UTILIZED TO PREVENT FALLS:  Life alert? No  Use of a cane, walker or w/c? No  Grab bars in the bathroom? No  Shower chair or bench in shower? No  Elevated toilet seat or a handicapped toilet? Yes   TIMED UP AND GO:  Was the test performed?  No .    Cognitive Function:        02/01/2022    8:47 AM 01/30/2021    1:36 PM  6CIT Screen  What Year? 0 points 0 points  What month? 0 points 0 points  What time? 0 points 0 points  Count back from 20 0 points 0 points  Months in reverse 0 points 0 points  Repeat phrase 0 points 2 points  Total Score 0 points 2 points    Immunizations Immunization History  Administered Date(s) Administered   Moderna Sars-Covid-2 Vaccination 05/10/2020   PFIZER(Purple Top)SARS-COV-2 Vaccination 08/18/2019, 09/05/2019   Pneumococcal Conjugate-13 01/30/2021   Td 05/08/1995, 12/14/2008   Tdap 11/20/2019    TDAP status: Up to date  Flu Vaccine status: Declined, Education has been provided regarding the importance of this vaccine but  patient still declined. Advised may receive this vaccine at local pharmacy or Health Dept. Aware to provide a copy of the vaccination record if obtained from local pharmacy or Health Dept. Verbalized acceptance and understanding.  Pneumococcal vaccine status: Up to date  Covid-19 vaccine status: Information provided on how to obtain vaccines.   Qualifies for Shingles Vaccine? Yes   Zostavax completed No   Shingrix Completed?: No.    Education has been provided regarding the importance of this vaccine. Patient has been advised to call insurance company to determine out of pocket expense if they have not yet received this vaccine. Advised may also receive vaccine at local pharmacy or Health Dept. Verbalized acceptance and understanding.  Screening Tests Health Maintenance  Topic Date Due   Fecal DNA (Cologuard)  Never done   Zoster Vaccines- Shingrix (1 of 2) Never done   COVID-19 Vaccine (4 - Pfizer series) 07/05/2020   DEXA SCAN  01/09/2021   Pneumonia Vaccine 67+ Years old (2 - PPSV23 or PCV20) 03/27/2021   MAMMOGRAM  12/09/2021   INFLUENZA VACCINE  08/05/2022 (Originally 12/05/2021)   TETANUS/TDAP  11/19/2029   Hepatitis C Screening  Completed   HPV VACCINES  Aged Out    Health Maintenance  Health Maintenance Due  Topic Date Due   Fecal DNA (Cologuard)  Never done   Zoster Vaccines- Shingrix (1 of 2) Never done   COVID-19 Vaccine (4 - Pfizer series) 07/05/2020   DEXA SCAN  01/09/2021   Pneumonia Vaccine 61+ Years old (2 - PPSV23 or PCV20) 03/27/2021   MAMMOGRAM  12/09/2021    Colorectal Screening- patient declined  Mammogram- patient declined  Bone Density- patient declined  Lung Cancer Screening: (Low Dose CT Chest recommended if Age 60-80 years, 30 pack-year currently smoking OR have quit w/in 15years.) does qualify.    Additional Screening:  Hepatitis C Screening: does qualify; Completed 11/20/19  Vision Screening: Recommended annual ophthalmology exams for early  detection of glaucoma and other disorders of the eye. Is the patient up to date with their annual eye exam?  Yes  Who is the provider or what is the name of the office in which the patient attends annual eye exams? Dr. Marvel Plan If pt is not established with a provider, would they like to be referred to a provider to establish care? No .   Dental Screening: Recommended annual dental exams for proper oral hygiene  Community Resource Referral / Chronic Care Management: CRR required this visit?  No   CCM required this visit?  No      Plan:     I have personally reviewed and noted the following in the patient's chart:  Medical and social history Use of alcohol, tobacco or illicit drugs  Current medications and supplements including opioid prescriptions. Patient is not currently taking opioid prescriptions. Functional ability and status Nutritional status Physical activity Advanced directives List of other physicians Hospitalizations, surgeries, and ER visits in previous 12 months Vitals Screenings to include cognitive, depression, and falls Referrals and appointments  In addition, I have reviewed and discussed with patient certain preventive protocols, quality metrics, and best practice recommendations. A written personalized care plan for preventive services as well as general preventive health recommendations were provided to patient.     Georgina Peer, [place]   02/01/2022   Nurse Notes: Non Face to Face 30 minutes    [name] , Thank you for taking time to come for your Medicare Wellness Visit. I appreciate your ongoing commitment to your health goals. Please review the following plan we discussed and let me know if I can assist you in the future.   These are the goals we discussed:  Goals      CCM Expected Outcome:  Monitor, Self-Manage and Reduce Symptoms of  depression     Help manage symptoms of depression exacerbated by homelessness.     CCM Expected  Outcome:  Monitor, Self-Manage and Reduce Symptoms of  homelessness     Help with finding safe housing. Help manage stress associated with homelessness.     CCM Expected Outcome:  Monitor, Self-Manage and Reduce Symptoms of  OSA     Has been unable to get CPAP to work as it's been turned off due to non-compliance due to homelessness. Help to see what we can do to get this turned back on.     Find Help in My Community     Task & activities to accomplish goals: Attend provider appointments Utilize resources and/or healthy coping skills discussed Contact PCP office with any questions or concerns      I have questions about my medicar coverage     Care Coordination Interventions:  Patient had questions regarding her Medicare coverage stating that she has commercial insurance as well as Medicare but it seems that following office visits, the commercial insurance is only billed This social worker provided patient with the contact number to the [employer] [phone] and to Medicare [phone] to call to address her billing question. Patient agreeable to calling the above contacts to follow up regarding her billing questions        This is a list of the screening recommended for you and due dates:  Health Maintenance  Topic Date Due   Cologuard (Stool DNA test)  Never done   Zoster (Shingles) Vaccine (1 of 2) Never done   COVID-19 Vaccine (4 - Pfizer series) 07/05/2020   DEXA scan (bone density measurement)  01/09/2021   Pneumonia Vaccine (2 - PPSV23 or PCV20) 03/27/2021   Mammogram  12/09/2021   Flu Shot  08/05/2022*   Tetanus Vaccine  11/19/2029   Hepatitis C Screening: USPSTF Recommendation to screen - Ages 18-79 yo.  Completed   HPV Vaccine  Aged Out  *Topic was postponed. The date shown is not the original due date.

## 2022-02-01 NOTE — Patient Instructions (Signed)

## 2022-02-12 ENCOUNTER — Other Ambulatory Visit: Payer: Self-pay | Admitting: Family Medicine

## 2022-02-13 NOTE — Telephone Encounter (Signed)
Requested medication (s) are due for refill today: yes  Requested medication (s) are on the active medication list: yes    Last refill: 02/03/21 #90  3 refills  Future visit scheduled yes 03/26/22  Notes to clinic:Failed due to labs. Labs ordered 09/22/21 not resulted. Please review.  Requested Prescriptions  Pending Prescriptions Disp Refills   levothyroxine (SYNTHROID) 125 MCG tablet [Pharmacy Med Name: LEVOTHYROXINE 125 MCG TABLET] 90 tablet 3    Sig: TAKE ONE TABLET BY MOUTH DAILY WITH BREAKFAST     Endocrinology:  Hypothyroid Agents Failed - 02/12/2022  6:21 AM      Failed - TSH in normal range and within 360 days    TSH  Date Value Ref Range Status  01/30/2021 1.880 0.450 - 4.500 uIU/mL Final         Passed - Valid encounter within last 12 months    Recent Outpatient Visits           4 months ago Hypothyroidism, unspecified type   [place], Megan P, DO   6 months ago Tobacco abuse   [place], [place], DO   1 year ago [employer] preventive visit   [employer], [place], DO   1 year ago Acute right-sided low back pain without sciatica   [place], DO   1 year ago Acute right-sided low back pain without sciatica   [facility] Valerie Roys, DO       Future Appointments             In 1 month Wynetta Emery, Barb Merino, DO [facility], [place]   In 2 months Caryl Comes, Revonda Standard, MD [place]. [place]

## 2022-02-16 NOTE — Telephone Encounter (Signed)
Secure chat received from Dr. Donnal Debar today:  Dr. Caryl Comes: Mickel Baas-- quick followup question for you regarding this lady-- you helped me find the " lost PFTs" on her a few weeks ago--she no longer sees [facility] and the QQ4U is whether there is any reason from PFTs in setting of dyspnea to have her see one of you guys in consultation thx steve   Dr. Patsey Berthold: do not see much on her PFTs that would explain her dyspnea however, we would certainly be happy to evaluate her and render opinion.  Dr. Caryl Comes: that would be great thx   Dr. Patsey Berthold: Have your staff send a consult that we will get her in as soon as we can

## 2022-02-18 ENCOUNTER — Other Ambulatory Visit: Payer: Self-pay | Admitting: Internal Medicine

## 2022-02-20 NOTE — Telephone Encounter (Signed)
Confirmed with Dr. Caryl Comes that the patient was aware that the pulmonary referral would be placed.   Referral order placed.

## 2022-02-23 ENCOUNTER — Telehealth (INDEPENDENT_AMBULATORY_CARE_PROVIDER_SITE_OTHER): Payer: BC Managed Care – PPO | Admitting: Physician Assistant

## 2022-02-23 ENCOUNTER — Ambulatory Visit: Payer: Self-pay

## 2022-02-23 ENCOUNTER — Encounter: Payer: Self-pay | Admitting: Physician Assistant

## 2022-02-23 DIAGNOSIS — R051 Acute cough: Secondary | ICD-10-CM | POA: Diagnosis not present

## 2022-02-23 DIAGNOSIS — R062 Wheezing: Secondary | ICD-10-CM

## 2022-02-23 DIAGNOSIS — J069 Acute upper respiratory infection, unspecified: Secondary | ICD-10-CM | POA: Diagnosis not present

## 2022-02-23 MED ORDER — BENZONATATE 100 MG PO CAPS: 100.0000 mg | ORAL_CAPSULE | Freq: Two times a day (BID) | ORAL | 0 refills | Status: DC | PRN

## 2022-02-23 MED ORDER — SPACER/AERO-HOLDING CHAMBERS DEVI: 1.0000 | 0 refills | Status: AC | PRN

## 2022-02-23 MED ORDER — ALBUTEROL SULFATE HFA 108 (90 BASE) MCG/ACT IN AERS: 2.0000 | INHALATION_SPRAY | Freq: Four times a day (QID) | RESPIRATORY_TRACT | 2 refills | Status: DC | PRN

## 2022-02-23 NOTE — Progress Notes (Signed)
Virtual Visit via Video Note  I connected with [place] on 02/23/22 at  3:20 PM EDT by a video enabled telemedicine application and verified that I am speaking with the correct person using two identifiers.  Today's Provider: Talitha Givens, MHS, PA-C Introduced myself to the patient as a PA-C and provided education on APPs in clinical practice.    Location: Patient: at home in [place], [place]  Provider: [place], [place]    I discussed the limitations of evaluation and management by telemedicine and the availability of in person appointments. The patient expressed understanding and agreed to proceed.  Chief Complaint  Patient presents with   Fever    Patient states she started running a fever today at 102.4. patient states she has cough, congestion, and body aches. Per patient she has not tested for COVID at home and will not until Sunday, states it is too early to test herself.         History of Present Illness:  Reports she woke up this morning with fever, congestion and cough, body aches   Onset:sudden  Duration: one day  Alleviating: nothing  Alleviating: nothing  Interventions: She has taken Ibuprofen Sick contacts: She denies recent sick contacts at home but work may have exposed her to infected people     Review of Systems  Constitutional:  Positive for chills, fever and malaise/fatigue.  HENT:  Positive for congestion and sore throat. Negative for ear pain.   Respiratory:  Positive for cough and wheezing. Negative for sputum production and shortness of breath.   Gastrointestinal:  Negative for diarrhea, nausea and vomiting.  Musculoskeletal:  Positive for myalgias.  Neurological:  Negative for dizziness and headaches.      Observations/Objective:  Due to the nature of the virtual visit, physical exam and observations are limited. Able to obtain the following observations:   Alert, oriented, Appears comfortable, in no acute  distress.  No scleral injection, no appreciated hoarseness, tachypnea, wheeze or strider. Able to maintain conversation without visible strain.   Cough and mild hoarseness  appreciated during visit.    Assessment and Plan:     Follow Up Instructions:    I discussed the assessment and treatment plan with the patient. The patient was provided an opportunity to ask questions and all were answered. The patient agreed with the plan and demonstrated an understanding of the instructions.   The patient was advised to call back or seek an in-person evaluation if the symptoms worsen or if the condition fails to improve as anticipated.  I provided 12 minutes of non-face-to-face time during this encounter.     Problem List Items Addressed This Visit   None Visit Diagnoses     Upper respiratory tract infection, unspecified type    -  Primary Acute, new concern Visit with patient indicates symptoms comprised of congestion, dry cough, chills and fever  since this morning  congruent with acute URI that is likely viral in nature  Patient has not tested for COVID at home - encouraged her to do this to rule out  Offered drive up testing for flu and COVID but patient declined Will provide Tessalon pearls to assist with cough Due to nature and duration of symptoms recommended treatment regimen is symptomatic relief and follow up if needed Discussed with patient the various viral and bacterial etiologies of current illness and appropriate course of treatment Discussed OTC medication options for multisymptom relief such as Dayquil/Nyquil, Theraflu, AlkaSeltzer, etc.  Recommend Mucinex with Tessalon and she can alternate Tylenol and Ibuprofen to assist with body aches as she does not like to take multi-symptom meds due to Afib  Discussed return precautions if symptoms are not improving or worsen over next 5-7 days.     Wheezing     Acute, new concern Suspect this is secondary to current URI  She has  a hx of COPD but I do not see inhalers in her current medication list Will provide Albuterol inhaler and spacer device to assist with wheezing  Reviewed that Albuterol may cause tachycardia so she should only use as necessary for breathing.  Follow up as needed for persistent or progressing symptoms    Relevant Medications   albuterol (VENTOLIN HFA) 108 (90 Base) MCG/ACT inhaler   Spacer/Aero-Holding Chambers DEVI   Acute cough       Relevant Medications   benzonatate (TESSALON) 100 MG capsule        No follow-ups on file.   I, Nigel Ericsson E [place] Franson, PA-C, have reviewed all documentation for this visit. The documentation on 02/23/22 for the exam, diagnosis, procedures, and orders are all accurate and complete.   Jacquelin Hawking, MHS, PA-C [facility]

## 2022-02-23 NOTE — Patient Instructions (Addendum)
Based on your described symptoms and the duration of symptoms it is likely that you have a viral upper respiratory infection (often called a "cold")  Symptoms can last for 3-10 days with lingering cough and intermittent symptoms lasting weeks after that.  The goal of treatment at this time is to reduce your symptoms and discomfort   I have sent in Tessalon pearls for you to take twice per day to help with your cough I have sent in an Albuterol inhaler for you to use to help with your breathing   You can use Mucinex to help with the congestion  Alternate Tylenol and Ibuprofen to help with your fever and body aches    If your symptoms do not improve or become worse in the next 5-7 days please make an apt at the office so we can see you  Go to the ER if you begin to have more serious symptoms such as shortness of breath, trouble breathing, loss of consciousness, swelling around the eyes, high fever, severe lasting headaches, vision changes or neck pain/stiffness.

## 2022-02-23 NOTE — Telephone Encounter (Signed)
  Chief Complaint: URI Symptoms: cough, congestion, fever, body aches, chills, SOB  Frequency: today Pertinent Negatives: NA Disposition: [] ED /[] Urgent Care (no appt availability in office) / [x] Appointment(In office/virtual)/ []  [place] [facility]/ [] Home Care/ [] Refused Recommended Disposition /[] [place] [facility]/ []  Follow-up with PCP Additional Notes: pt is asking for Tessalon perles, says that is what helps her the best. Scheduled VV for today at 76 with [place], [place].   Summary: Cough   Patient states she woke up with a cough this morning and is requesting a prescription for Tessalon perles.      Reason for Disposition  [1] Fever > 101 F (38.3 C) AND [2] age > 60 years  Answer Assessment - Initial Assessment Questions 1. ONSET: "When did the cough begin?"      Today 5. DIFFICULTY BREATHING: "Are you having difficulty breathing?" If Yes, ask: "How bad is it?" (e.g., mild, moderate, severe)    - MILD: No SOB at rest, mild SOB with walking, speaks normally in sentences, can lie down, no retractions, pulse < 100.    - MODERATE: SOB at rest, SOB with minimal exertion and prefers to sit, cannot lie down flat, speaks in phrases, mild retractions, audible wheezing, pulse 100-120.    - SEVERE: Very SOB at rest, speaks in single words, struggling to breathe, sitting hunched forward, retractions, pulse > 120      moderate 6. FEVER: "Do you have a fever?" If Yes, ask: "What is your temperature, how was it measured, and when did it start?"     102 10. OTHER SYMPTOMS: "Do you have any other symptoms?" (e.g., runny nose, wheezing, chest pain)       Congestion, body aches, chills  Protocols used: Cough - Acute Productive-A-AH

## 2022-03-26 ENCOUNTER — Encounter: Payer: Self-pay | Admitting: Family Medicine

## 2022-03-26 ENCOUNTER — Ambulatory Visit (INDEPENDENT_AMBULATORY_CARE_PROVIDER_SITE_OTHER): Payer: BC Managed Care – PPO | Admitting: Family Medicine

## 2022-03-26 VITALS — BP 126/71 | HR 91 | Temp 98.6°F | Wt 210.0 lb

## 2022-03-26 DIAGNOSIS — K219 Gastro-esophageal reflux disease without esophagitis: Secondary | ICD-10-CM

## 2022-03-26 DIAGNOSIS — R8281 Pyuria: Secondary | ICD-10-CM

## 2022-03-26 DIAGNOSIS — R051 Acute cough: Secondary | ICD-10-CM

## 2022-03-26 DIAGNOSIS — F411 Generalized anxiety disorder: Secondary | ICD-10-CM

## 2022-03-26 DIAGNOSIS — E782 Mixed hyperlipidemia: Secondary | ICD-10-CM | POA: Diagnosis not present

## 2022-03-26 DIAGNOSIS — N1831 Chronic kidney disease, stage 3a: Secondary | ICD-10-CM

## 2022-03-26 DIAGNOSIS — E039 Hypothyroidism, unspecified: Secondary | ICD-10-CM

## 2022-03-26 DIAGNOSIS — J0100 Acute maxillary sinusitis, unspecified: Secondary | ICD-10-CM | POA: Diagnosis not present

## 2022-03-26 DIAGNOSIS — F331 Major depressive disorder, recurrent, moderate: Secondary | ICD-10-CM

## 2022-03-26 LAB — URINALYSIS, ROUTINE W REFLEX MICROSCOPIC
Bilirubin, UA: NEGATIVE
Glucose, UA: NEGATIVE
Ketones, UA: NEGATIVE
Nitrite, UA: NEGATIVE
Specific Gravity, UA: 1.02 (ref 1.005–1.030)
Urobilinogen, Ur: 0.2 mg/dL (ref 0.2–1.0)
pH, UA: 6 (ref 5.0–7.5)

## 2022-03-26 LAB — MICROSCOPIC EXAMINATION: Bacteria, UA: NONE SEEN

## 2022-03-26 MED ORDER — BENZONATATE 100 MG PO CAPS: 100.0000 mg | ORAL_CAPSULE | Freq: Two times a day (BID) | ORAL | 0 refills | Status: DC | PRN

## 2022-03-26 MED ORDER — DOXYCYCLINE HYCLATE 100 MG PO TABS: 100.0000 mg | ORAL_TABLET | Freq: Two times a day (BID) | ORAL | 0 refills | Status: DC

## 2022-03-26 MED ORDER — ATORVASTATIN CALCIUM 40 MG PO TABS: 40.0000 mg | ORAL_TABLET | Freq: Every day | ORAL | 1 refills | Status: DC

## 2022-03-26 MED ORDER — CITALOPRAM HYDROBROMIDE 40 MG PO TABS: 60.0000 mg | ORAL_TABLET | Freq: Every day | ORAL | 1 refills | Status: DC

## 2022-03-26 NOTE — Assessment & Plan Note (Signed)
Under good control on current regimen. Continue current regimen. Continue to monitor. Call with any concerns. Refills given. Labs drawn today.   

## 2022-03-26 NOTE — Assessment & Plan Note (Signed)
Under good control on current regimen. Continue current regimen. Continue to monitor. Call with any concerns. Refills given.   

## 2022-03-26 NOTE — Assessment & Plan Note (Signed)
Rechecking labs today. Await results. Treat as needed.  °

## 2022-03-26 NOTE — Progress Notes (Signed)
BP 126/71   Pulse 91   Temp 98.6 F (37 C) (Oral)   Wt 210 lb (95.3 kg)   SpO2 96%   BMI 34.95 kg/m    Subjective:    [name], female    DOB: [birth year], 66 y.o.   MRN: [id number]  HPI: [name] is a 66 y.o. female  Chief Complaint  Patient presents with   Hypothyroidism   Hyperlipidemia   Depression   Medication Refill    Patient is requesting a refill on Tessalon Perles and refill on inhaler.    UPPER RESPIRATORY TRACT INFECTION Duration: about a month Worst symptom: congestion Fever: no Cough: yes Shortness of breath: yes Wheezing: yes Chest pain: no Chest tightness: no Chest congestion: yes Nasal congestion: no Runny nose: no Post nasal drip: no Sneezing: no Sore throat: no Swollen glands: no Sinus pressure: yes Headache: yes Face pain: no Toothache: yes Ear pain: no  Ear pressure: no  Eyes red/itching:no Eye drainage/crusting: yes  Vomiting: no Rash: no Fatigue: yes Sick contacts: no Strep contacts: no  Context: worse Recurrent sinusitis: no Relief with OTC cold/cough medications: no  Treatments attempted: inhaler, perles   HYPERLIPIDEMIA Hyperlipidemia status: excellent compliance Satisfied with current treatment?  yes Side effects:  no Medication compliance: excellent compliance Past cholesterol meds: atorvastatin Supplements: none Aspirin:  no The 10-year ASCVD risk score (Arnett DK, et al., 2019) is: 7.9%   Values used to calculate the score:     Age: 66 years     Sex: Female     Is Non-Hispanic African American: No     Diabetic: No     Tobacco smoker: No     Systolic Blood Pressure: 123XX123 mmHg     Is BP treated: Yes     HDL Cholesterol: 39 mg/dL     Total Cholesterol: 149 mg/dL Chest pain:  no Coronary artery disease:  no  HYPOTHYROIDISM Thyroid control status:controlled Satisfied with current treatment? yes Medication side effects: no Medication compliance: excellent compliance Recent dose  adjustment:no Fatigue: yes Cold intolerance: no Heat intolerance: no Weight gain: no Weight loss: no Constipation: no Diarrhea/loose stools: no Palpitations: no Lower extremity edema: no Anxiety/depressed mood: no  DEPRESSION Mood status: controlled Satisfied with current treatment?: yes Symptom severity: mild  Duration of current treatment : chronic Side effects: no Medication compliance: excellent compliance Psychotherapy/counseling: no  Previous psychiatric medications: celexa Depressed mood: no Anxious mood: no Anhedonia: no Significant weight loss or gain: no Insomnia: no  Fatigue: yes Feelings of worthlessness or guilt: no Impaired concentration/indecisiveness: no Suicidal ideations: no Hopelessness: no Crying spells: no    03/26/2022   10:19 AM 02/01/2022    8:44 AM 10/18/2021   11:08 AM 08/10/2021    4:17 PM 01/30/2021    1:27 PM  Depression screen PHQ 2/9  Decreased Interest 0 0 0 0 0  Down, Depressed, Hopeless 0 0 0 0 1  PHQ - 2 Score 0 0 0 0 1  Altered sleeping 1 3  1 3   Tired, decreased energy 1 0  1 3  Change in appetite 0 0  0 0  Feeling bad or failure about yourself  0 0  0 0  Trouble concentrating 0 0  0 0  Moving slowly or fidgety/restless 0 0  0 0  Suicidal thoughts 0 0  0 0  PHQ-9 Score 2 3  2 7   Difficult doing work/chores  Not difficult at all  Not difficult at  all Not difficult at all    Relevant past medical, surgical, family and social history reviewed and updated as indicated. Interim medical history since our last visit reviewed. Allergies and medications reviewed and updated.  Review of Systems  Constitutional: Negative.   HENT:  Positive for congestion, postnasal drip, rhinorrhea, sinus pressure and sinus pain. Negative for dental problem, drooling, ear discharge, ear pain, facial swelling, hearing loss, mouth sores, nosebleeds, sneezing, sore throat, tinnitus, trouble swallowing and voice change.   Respiratory:  Positive for cough  and shortness of breath. Negative for apnea, choking, chest tightness, wheezing and stridor.   Cardiovascular: Negative.   Gastrointestinal: Negative.   Musculoskeletal: Negative.   Neurological: Negative.   Psychiatric/Behavioral: Negative.      Per HPI unless specifically indicated above     Objective:    BP 126/71   Pulse 91   Temp 98.6 F (37 C) (Oral)   Wt 210 lb (95.3 kg)   SpO2 96%   BMI 34.95 kg/m   Wt Readings from Last 3 Encounters:  03/26/22 210 lb (95.3 kg)  02/01/22 208 lb (94.3 kg)  11/02/21 201 lb (91.2 kg)    Physical Exam Vitals and nursing note reviewed.  Constitutional:      General: She is not in acute distress.    Appearance: Normal appearance. She is obese. She is not ill-appearing, toxic-appearing or diaphoretic.  HENT:     Head: Normocephalic and atraumatic.     Right Ear: External ear normal. There is impacted cerumen.     Left Ear: External ear normal. There is impacted cerumen.     Nose: Congestion and rhinorrhea present.     Mouth/Throat:     Mouth: Mucous membranes are moist.     Pharynx: Oropharynx is clear. No oropharyngeal exudate or posterior oropharyngeal erythema.  Eyes:     General: No scleral icterus.       Right eye: No discharge.        Left eye: No discharge.     Extraocular Movements: Extraocular movements intact.     Conjunctiva/sclera: Conjunctivae normal.     Pupils: Pupils are equal, round, and reactive to light.  Cardiovascular:     Rate and Rhythm: Normal rate and regular rhythm.     Pulses: Normal pulses.     Heart sounds: Normal heart sounds. No murmur heard.    No friction rub. No gallop.  Pulmonary:     Effort: Pulmonary effort is normal. No respiratory distress.     Breath sounds: Normal breath sounds. No stridor. No wheezing, rhonchi or rales.  Chest:     Chest wall: No tenderness.  Musculoskeletal:        General: Normal range of motion.     Cervical back: Normal range of motion and neck supple.  Skin:     General: Skin is warm and dry.     Capillary Refill: Capillary refill takes less than 2 seconds.     Coloration: Skin is not jaundiced or pale.     Findings: No bruising, erythema, lesion or rash.  Neurological:     General: No focal deficit present.     Mental Status: She is alert and oriented to person, place, and time. Mental status is at baseline.  Psychiatric:        Mood and Affect: Mood normal.        Behavior: Behavior normal.        Thought Content: Thought content normal.  Judgment: Judgment normal.     Results for orders placed or performed in visit on 03/26/22  Microscopic Examination   Urine  Result Value Ref Range   WBC, UA 11-30 (A) 0 - 5 /hpf   RBC, Urine 0-2 0 - 2 /hpf   Epithelial Cells (non renal) 0-10 0 - 10 /hpf   Mucus, UA Present (A) Not Estab.   Bacteria, UA None seen None seen/Few  Urinalysis, Routine w reflex microscopic  Result Value Ref Range   Specific Gravity, UA 1.020 1.005 - 1.030   pH, UA 6.0 5.0 - 7.5   Color, UA Yellow Yellow   Appearance Ur Cloudy (A) Clear   Leukocytes,UA 1+ (A) Negative   Protein,UA 1+ (A) Negative/Trace   Glucose, UA Negative Negative   Ketones, UA Negative Negative   RBC, UA 1+ (A) Negative   Bilirubin, UA Negative Negative   Urobilinogen, Ur 0.2 0.2 - 1.0 mg/dL   Nitrite, UA Negative Negative   Microscopic Examination See below:       Assessment & Plan:   Problem List Items Addressed This Visit       Digestive   GERD (gastroesophageal reflux disease)    Under good control on current regimen. Continue current regimen. Continue to monitor. Call with any concerns. Refills given. Labs drawn today.        Relevant Orders   CBC with Differential/Platelet   Comprehensive metabolic panel     Endocrine   Hypothyroidism    Rechecking labs today. Await results. Treat as needed.       Relevant Orders   Comprehensive metabolic panel   TSH     Genitourinary   CKD (chronic kidney disease) stage 3, GFR  30-59 ml/min (HCC)    Rechecking labs today. Await results.      Relevant Orders   Urinalysis, Routine w reflex microscopic (Completed)     Other   Hyperlipidemia    Under good control on current regimen. Continue current regimen. Continue to monitor. Call with any concerns. Refills given. Labs drawn today.        Relevant Medications   atorvastatin (LIPITOR) 40 MG tablet   Other Relevant Orders   Comprehensive metabolic panel   Lipid Panel w/o Chol/HDL Ratio   Anxiety state    Under good control on current regimen. Continue current regimen. Continue to monitor. Call with any concerns. Refills given.        Relevant Medications   citalopram (CELEXA) 40 MG tablet   Depression, major, recurrent, moderate (HCC)    Under good control on current regimen. Continue current regimen. Continue to monitor. Call with any concerns. Refills given.        Relevant Medications   citalopram (CELEXA) 40 MG tablet   Other Relevant Orders   Comprehensive metabolic panel   Other Visit Diagnoses     Acute non-recurrent maxillary sinusitis    -  Primary   Will treat with doxycycline. Call if not getting better or getting worse.   Relevant Medications   doxycycline (VIBRA-TABS) 100 MG tablet   benzonatate (TESSALON) 100 MG capsule   Acute cough       Relevant Medications   benzonatate (TESSALON) 100 MG capsule        Follow up plan: Return in about 6 months (around 09/24/2022).

## 2022-03-26 NOTE — Assessment & Plan Note (Signed)
Rechecking labs today. Await results.  

## 2022-03-27 ENCOUNTER — Other Ambulatory Visit: Payer: Self-pay | Admitting: Family Medicine

## 2022-03-27 LAB — CBC WITH DIFFERENTIAL/PLATELET
Basophils Absolute: 0.1 10*3/uL (ref 0.0–0.2)
Basos: 1 %
EOS (ABSOLUTE): 0.1 10*3/uL (ref 0.0–0.4)
Eos: 1 %
Hematocrit: 46.8 % — ABNORMAL HIGH (ref 34.0–46.6)
Hemoglobin: 15.4 g/dL (ref 11.1–15.9)
Immature Grans (Abs): 0 10*3/uL (ref 0.0–0.1)
Immature Granulocytes: 0 %
Lymphocytes Absolute: 3.2 10*3/uL — ABNORMAL HIGH (ref 0.7–3.1)
Lymphs: 33 %
MCH: 30.9 pg (ref 26.6–33.0)
MCHC: 32.9 g/dL (ref 31.5–35.7)
MCV: 94 fL (ref 79–97)
Monocytes Absolute: 0.7 10*3/uL (ref 0.1–0.9)
Monocytes: 7 %
Neutrophils Absolute: 5.7 10*3/uL (ref 1.4–7.0)
Neutrophils: 58 %
Platelets: 237 10*3/uL (ref 150–450)
RBC: 4.98 x10E6/uL (ref 3.77–5.28)
RDW: 11.9 % (ref 11.7–15.4)
WBC: 9.7 10*3/uL (ref 3.4–10.8)

## 2022-03-27 LAB — COMPREHENSIVE METABOLIC PANEL
ALT: 22 IU/L (ref 0–32)
AST: 23 IU/L (ref 0–40)
Albumin/Globulin Ratio: 1.7 (ref 1.2–2.2)
Albumin: 4.3 g/dL (ref 3.9–4.9)
Alkaline Phosphatase: 87 IU/L (ref 44–121)
BUN/Creatinine Ratio: 12 (ref 12–28)
BUN: 12 mg/dL (ref 8–27)
Bilirubin Total: 0.5 mg/dL (ref 0.0–1.2)
CO2: 27 mmol/L (ref 20–29)
Calcium: 9.5 mg/dL (ref 8.7–10.3)
Chloride: 101 mmol/L (ref 96–106)
Creatinine, Ser: 1.04 mg/dL — ABNORMAL HIGH (ref 0.57–1.00)
Globulin, Total: 2.5 g/dL (ref 1.5–4.5)
Glucose: 82 mg/dL (ref 70–99)
Potassium: 4.7 mmol/L (ref 3.5–5.2)
Sodium: 141 mmol/L (ref 134–144)
Total Protein: 6.8 g/dL (ref 6.0–8.5)
eGFR: 59 mL/min/{1.73_m2} — ABNORMAL LOW (ref >59)

## 2022-03-27 LAB — LIPID PANEL W/O CHOL/HDL RATIO
Cholesterol, Total: 180 mg/dL (ref 100–199)
HDL: 55 mg/dL (ref >39)
LDL Chol Calc (NIH): 105 mg/dL — ABNORMAL HIGH (ref 0–99)
Triglycerides: 112 mg/dL (ref 0–149)
VLDL Cholesterol Cal: 20 mg/dL (ref 5–40)

## 2022-03-27 LAB — TSH: TSH: 3.38 u[IU]/mL (ref 0.450–4.500)

## 2022-04-06 ENCOUNTER — Encounter: Payer: Self-pay | Admitting: Pulmonary Disease

## 2022-04-06 ENCOUNTER — Ambulatory Visit (INDEPENDENT_AMBULATORY_CARE_PROVIDER_SITE_OTHER): Payer: BC Managed Care – PPO | Admitting: Pulmonary Disease

## 2022-04-06 VITALS — BP 124/84 | HR 70 | Temp 98.3°F | Ht 65.0 in | Wt 212.6 lb

## 2022-04-06 DIAGNOSIS — E669 Obesity, unspecified: Secondary | ICD-10-CM | POA: Diagnosis not present

## 2022-04-06 DIAGNOSIS — J449 Chronic obstructive pulmonary disease, unspecified: Secondary | ICD-10-CM | POA: Diagnosis not present

## 2022-04-06 DIAGNOSIS — G4733 Obstructive sleep apnea (adult) (pediatric): Secondary | ICD-10-CM | POA: Diagnosis not present

## 2022-04-06 DIAGNOSIS — R0602 Shortness of breath: Secondary | ICD-10-CM

## 2022-04-06 DIAGNOSIS — Z720 Tobacco use: Secondary | ICD-10-CM

## 2022-04-06 LAB — NITRIC OXIDE: Nitric Oxide: 29

## 2022-04-06 NOTE — Progress Notes (Signed)
Subjective:    [name], female    DOB: 12-07-1955, 66 y.o.   MRN: [id number] Patient Care Team: Valerie Roys, DO as PCP - General (Family Medicine) Rebekah Chesterfield, LCSW as Social Worker (Licensed Clinical Social Worker)  Chief Complaint  Patient presents with   Consult    SOB for 6-8 months. No wheezing or cough. Has sleep apnea.   HPI Patient is a 66 year old current smoker with a 40-pack-year history of smoking as noted below, who presents for evaluation of shortness of breath over the last 6 to 8 months.  She is kindly referred by Dr. Adam Phenix, her primary care physician is Dr. Park Liter.  She states that she notes the shortness of breath mostly on exertion particularly when she is carrying a weight.  She notes that stairs and inclines also exacerbate her symptoms.  Thing helps her symptoms.  She also notices significant bendopnea (shortness of breath on bending over).  She has a history of atrial fibrillation and is followed by Dr. Caryl Comes for this issue.  Apparently has had atrial fibrillation since the 1990s.  She takes propafenone for this.  Her atrial for patient is paroxysmal and she does get tachypalpitations when she is in it.  She believes she may have had COVID but cannot confirm as she never tested but feels she had it.  Has had some exertional back pain that is not usually relieved with rest.  She is not currently on any maintenance inhalers does use albuterol approximately 2 times a week with mixed results on relief of dyspnea.  She has not had any chest pain.  She has noted some weight loss but no anorexia.  She has not had any orthopnea or paroxysmal nocturnal dyspnea.  No lower extremity edema and no calf tenderness.  She has occasional cough productive of clear sputum mostly in the mornings.  No hemoptysis.  Fevers, chills or sweats.  In the past she has been diagnosed with sleep apnea.  She was diagnosed approximately 15+ years ago.  She had UPPP  plus tonsillectomy around that time and did well but then again approximately 70 pounds.  She was seen by Dr. Devona Konig for this issue in September 2021 but does not look like she followed up after that.  She had a sleep study in October 2021 which showed a total AHI of 20.6 and a REM related AHI of 45.9, this was consistent with moderate to severe obstructive sleep apnea.  She had subsequently a titration study in December 2021 that fitted her for CPAP of 9 cm of H2O.  She has used [facility] now [employer] as DME.  She has had a significant break in therapy from CPAP has not been using it.  Does have nonrestorative sleep and daytime sleepiness.  Patient works as a Scientist, clinical (histocompatibility and immunogenetics), she has no significant occupational exposures.  DATA 12/26/2021 PFTs: FEV1 2.41 L or 95% predicted, FVC 3.27 L or 99% predicted FEV1/FVC 74%, lung volumes low normal.  Postbronchodilator study not done.  Diffusion capacity mildly reduced.  There is curvature of the flow-volume loop consistent with mild obstruction. 12/26/2021 echocardiogram: LVEF 60 to 65%, no regional wall motion abnormalities, grade 1 DD, RV function normal, LA size moderately dilated, mild mitral regurg.  No mitral stenosis.  No aortic valve abnormalities.  Review of Systems A 10 point review of systems was performed and it is as noted above otherwise negative.  Past Medical History:  Diagnosis  Date   Anxiety state, unspecified    Back pain    Chest pain    a. 2008 Cath: reportedly nl;  b. 09/2012 Lexi MV: EF 75%, soft tissue attenuation->Low risk.   COPD (chronic obstructive pulmonary disease) (HCC)    CTS (carpal tunnel syndrome)    Depression    GERD (gastroesophageal reflux disease)    Migraines    Neck pain    Nontoxic multinodular goiter    Other and unspecified hyperlipidemia    Other and unspecified ovarian cyst    left   Other chest pain    PAF (paroxysmal atrial fibrillation) ([place])    a. 07/2007 Echo: EF 65%, mildly dil  LA;  b. currently on propafenone;  c. CHA2DS2VASc = 1 (gender)-->No anticoagulation.   PTSD (post-traumatic stress disorder)    Tobacco use    Tobacco use disorder    Unspecified hypothyroidism    Unspecified sleep apnea    resolved s/p UPPP surgery   Vitamin B12 deficiency    Past Surgical History:  Procedure Laterality Date   bladder tac     CARDIAC CATHETERIZATION  08/12/06   neg. Dr. Humphrey Rolls, cardio   CHOLECYSTECTOMY     FOOT SURGERY     bone removal from her foot   MRI lumber spine     03/12/06-abn   MRI pelvis  05/02/06   [facility], neurosurg   NASAL POLYP SURGERY     nasal ?   PARATHYROIDECTOMY     stress cardiolite  10/06/96   nl, EF 62%   stress myoview  10/25/03   Dr. Chancy Milroy, nl   THYROIDECTOMY  5/92   massive goiter   TONSILLECTOMY     TUBAL LIGATION  1988   UVULOPALATOPHARYNGOPLASTY     and tonsilectomy. Nadeen Landau 10/04   Patient Active Problem List   Diagnosis Date Noted   Homelessness 09/22/2021   Advance directive discussed with patient 01/30/2021   Scoliosis 08/02/2015   Chronic fatigue 02/06/2015   CKD (chronic kidney disease) stage 3, GFR 30-59 ml/min (HCC) 02/06/2015   Nasal polyp, posterior 02/03/2015   Muscle spasms of head and/or neck 11/11/2014   Neck pain of over 3 months duration 10/21/2014   GERD (gastroesophageal reflux disease)    Hypothyroidism    Migraines    Depression, major, recurrent, moderate (HCC) 07/20/2014    Class: Chronic   OSA (obstructive sleep apnea) 02/15/2014   Atrial fibrillation ([place]) 08/29/2012   Dyspnea on exertion 08/29/2012   DEGENERATIVE JOINT DISEASE 02/25/2008   Hyperlipidemia 01/29/2007   COPD (chronic obstructive pulmonary disease) ([place]) 01/29/2007   Anxiety state 01/28/2007   Tobacco abuse 01/28/2007   OVARIAN CYST, LEFT 05/14/2006   Family History  Problem Relation Age of Onset   Heart disease Mother    Atrial fibrillation Mother    Congestive Heart Failure Mother    Stroke Father    Atrial  fibrillation Sister        s/p ablation   Breast cancer Sister 4   Hyperlipidemia Daughter    Heart disease Daughter        leaky valve   Atrial fibrillation Other    Heart attack Other        both sides   Stroke Other        both sides   Depression Other        both sides   Prostate cancer Neg Hx    Colon cancer Neg Hx    Allergies  Allergen Reactions  Bee Venom Anaphylaxis   Penicillins Rash   Sulfonamide Derivatives Rash   Cardizem  [Diltiazem Hcl] Other (See Comments)    Other Reaction: severe hypotension w/ IV dose   Sulfa Antibiotics Hives   Tape Rash    Uncoded Allergy. Allergen: tape & bandaids   Social History   Tobacco Use   Smoking status: Some Days    Packs/day: 2.00    Years: 20.00    Total pack years: 40.00    Types: Cigarettes    Last attempt to quit: 09/30/2017    Years since quitting: 4.5   Smokeless tobacco: Never   Tobacco comments:    3 cigarettes a week  Substance Use Topics   Alcohol use: Yes    Alcohol/week: 0.0 standard drinks of alcohol    Comment: Rare   Immunization History  Administered Date(s) Administered   Moderna Sars-Covid-2 Vaccination 05/10/2020   PFIZER(Purple Top)SARS-COV-2 Vaccination 08/18/2019, 09/05/2019   Pneumococcal Conjugate-13 01/30/2021   Td 05/08/1995, 12/14/2008   Tdap 11/20/2019      Objective:   Physical Exam BP 124/84 (BP Location: Left Arm, Cuff Size: Normal)   Pulse 70   Temp 98.3 F (36.8 C)   Ht 5' 5"$  (1.651 m)   Wt 212 lb 9.6 oz (96.4 kg)   SpO2 98%   BMI 35.38 kg/m  GENERAL: Obese woman, no acute distress, fully ambulatory, no conversational dyspnea. HEAD: Normocephalic, atraumatic.  EYES: Pupils equal, round, reactive to light.  No scleral icterus.  MOUTH: Poor dentition, missing teeth, chipped, Mallampati class III. NECK: Supple. No thyromegaly. Trachea midline. No JVD.  No adenopathy. PULMONARY: Good air entry bilaterally.  Very mild end expiratory wheezes on the upper lung zones  otherwise clear. CARDIOVASCULAR: S1 and S2. Regular rate and rhythm.  No rubs, murmurs or gallops heard. ABDOMEN: Obese otherwise, benign. MUSCULOSKELETAL: Significant kyphosis noted no joint deformity, no clubbing, no edema.  NEUROLOGIC: No overt focal deficit, no gait disturbance, speech is fluent. SKIN: Intact,warm,dry. PSYCH: Mood and behavior normal.  Lab Results  Component Value Date   NITRICOXIDE 29 04/06/2022      04/06/2022    9:00 AM  Results of the [facility]worth flowsheet  Sitting and reading 1  Watching TV 1  Sitting, inactive in a public place (e.g. a theatre or a meeting) 0  As a passenger in a car for an hour without a break 0  Lying down to rest in the afternoon when circumstances permit 3  Sitting and talking to someone 0  Sitting quietly after a lunch without alcohol 1  In a car, while stopped for a few minutes in traffic 0  Total score 6         Assessment & Plan:     ICD-10-CM   1. SOB (shortness of breath)  R06.02 Nitric oxide   Suspect multifactorial Obesity/deconditioning main issue  Symptomatic proportion to mild PFT findings    2. Stage 1 mild COPD by GOLD classification (HCC)  J44.9    Continue albuterol as needed for now Keep track of how often it is needed May need maintenance inhaler    3. OSA (obstructive sleep apnea)  G47.33 Home sleep test   Graded as moderate to severe previously Needs home sleep study Suspect will need CPAP therapy    4. Obesity (BMI 35.0-39.9 without comorbidity)  E66.9    Issue adds complexity to her management Recommend weight loss    5. Tobacco abuse, episodic  Z72.0 Ambulatory Referral for Lung Cancer Scre  Patient counseled regards discontinuation of smoking Total counseling time 3 to 5 minutes Refer to lung cancer screening program     Orders Placed This Encounter  Procedures   Ambulatory Referral for Lung Cancer Scre    Referral Priority:   Routine    Referral Type:   Consultation    Referral Reason:    Specialty Services Required    Number of Visits Requested:   1   Nitric oxide   Home sleep test    Standing Status:   Future    Standing Expiration Date:   04/07/2023    Order Specific Question:   Where should this test be performed:    Answer:   [facility]   Will see the patient in follow-up in 2 to 3 months time she is to call sooner should any new problems arise.  We will call her with the results of the home sleep test once this is done.   Renold Don, MD Advanced Bronchoscopy PCCM [facility]    *This note was dictated using voice recognition software/Dragon.  Despite best efforts to proofread, errors can occur which can change the meaning. Any transcriptional errors that result from this process are unintentional and may not be fully corrected at the time of dictation.

## 2022-04-06 NOTE — Patient Instructions (Signed)
We are going to order a home sleep study.  We are referring you to the lung cancer screening program they will schedule a CT of the chest.  Continue using the albuterol as you are doing.  Keep track of how often you require it.  Work on weight loss.  We will see you in follow-up in 2 to 3 months time call sooner should any new problems arise.

## 2022-04-17 ENCOUNTER — Other Ambulatory Visit: Payer: Self-pay | Admitting: Family Medicine

## 2022-04-17 DIAGNOSIS — R062 Wheezing: Secondary | ICD-10-CM

## 2022-04-17 MED ORDER — PANTOPRAZOLE SODIUM 20 MG PO TBEC: 20.0000 mg | DELAYED_RELEASE_TABLET | Freq: Every day | ORAL | 3 refills | Status: DC

## 2022-04-17 NOTE — Telephone Encounter (Signed)
Requested medication (s) are due for refill today: yes   Requested medication (s) are on the active medication list: yes   Last refill:  synthroid- 02/13/22 #90 3 refills, ventolin-02/23/22 #8g 2 refills. Lipitor- 03/26/22 #90 1 refills celexa- 03/26/22 #135 1 refills  Future visit scheduled: yes in 2 weeks   Notes to clinic:  patient requesting medication refills early due to trying to get all medication refilled at the same time. Please advise if can refill early?     Requested Prescriptions  Pending Prescriptions Disp Refills   levothyroxine (SYNTHROID) 125 MCG tablet 90 tablet 3    Sig: Take 1 tablet (125 mcg total) by mouth daily with breakfast.     Endocrinology:  Hypothyroid Agents Passed - 04/17/2022  5:30 PM      Passed - TSH in normal range and within 360 days    TSH  Date Value Ref Range Status  03/26/2022 3.380 0.450 - 4.500 uIU/mL Final         Passed - Valid encounter within last 12 months    Recent Outpatient Visits           3 weeks ago Acute non-recurrent maxillary sinusitis   [facility] [place], Megan P, DO   1 month ago Upper respiratory tract infection, unspecified type   [employer], Erin E, PA-C   6 months ago Hypothyroidism, unspecified type   [employer], [place], DO   8 months ago Tobacco abuse   [facility] [place], [place], DO   1 year ago Welcome to [employer] preventive visit   [employer], [place], DO       Future Appointments             In 1 week Duke Salvia, MD [facility] A Dept Of [place]. [facility]   In 2 weeks Johnson, Megan P, DO [facility], [facility]   In 5 months Johnson, Megan P, DO [facility]             albuterol (VENTOLIN HFA) 108 (90 Base) MCG/ACT inhaler 8 g 2    Sig: Inhale 2 puffs into the lungs every 6 (six) hours as needed for wheezing or shortness of breath.      Pulmonology:  Beta Agonists 2 Passed - 04/17/2022  5:30 PM      Passed - Last BP in normal range    BP Readings from Last 1 Encounters:  04/06/22 124/84         Passed - Last Heart Rate in normal range    Pulse Readings from Last 1 Encounters:  04/06/22 70         Passed - Valid encounter within last 12 months    Recent Outpatient Visits           3 weeks ago Acute non-recurrent maxillary sinusitis   [facility] [place], Megan P, DO   1 month ago Upper respiratory tract infection, unspecified type   [employer], Erin E, PA-C   6 months ago Hypothyroidism, unspecified type   [employer], [place], DO   8 months ago Tobacco abuse   [facility] [place], [place], DO   1 year ago Welcome to [employer] preventive visit   [employer], Oralia Rud, DO       Future Appointments  In 1 week Duke Salvia, MD [facility] A Dept Of [place]. [facility]   In 2 weeks Johnson, Megan P, DO [facility]   In 5 months Johnson, Megan P, DO [facility]             atorvastatin (LIPITOR) 40 MG tablet 90 tablet 1    Sig: Take 1 tablet (40 mg total) by mouth daily.     Cardiovascular:  Antilipid - Statins Failed - 04/17/2022  5:30 PM      Failed - Lipid Panel in normal range within the last 12 months    Cholesterol, Total  Date Value Ref Range Status  03/26/2022 180 100 - 199 mg/dL Final   LDL Chol Calc (NIH)  Date Value Ref Range Status  03/26/2022 105 (H) 0 - 99 mg/dL Final   Direct LDL  Date Value Ref Range Status  03/18/2007 150.0 mg/dL Final    Comment:    See lab report for associated comment(s)   HDL  Date Value Ref Range Status  03/26/2022 55 >39 mg/dL Final   Triglycerides  Date Value Ref Range Status  03/26/2022 112 0 - 149 mg/dL Final         Passed - Patient is not pregnant      Passed - Valid encounter  within last 12 months    Recent Outpatient Visits           3 weeks ago Acute non-recurrent maxillary sinusitis   [facility] [place], Megan P, DO   1 month ago Upper respiratory tract infection, unspecified type   [employer], Erin E, PA-C   6 months ago Hypothyroidism, unspecified type   [employer], [place], DO   8 months ago Tobacco abuse   [facility] [place], [place], DO   1 year ago Welcome to [employer] preventive visit   [employer], [place], DO       Future Appointments             In 1 week Duke Salvia, MD [facility] A Dept Of [place]. [facility]   In 2 weeks Johnson, Megan P, DO [facility]   In 5 months Johnson, Megan P, DO [facility]             citalopram (CELEXA) 40 MG tablet 135 tablet 1    Sig: Take 1.5 tablets (60 mg total) by mouth daily.     Psychiatry:  Antidepressants - SSRI Passed - 04/17/2022  5:30 PM      Passed - Completed PHQ-2 or PHQ-9 in the last 360 days      Passed - Valid encounter within last 6 months    Recent Outpatient Visits           3 weeks ago Acute non-recurrent maxillary sinusitis   [facility] [place], Megan P, DO   1 month ago Upper respiratory tract infection, unspecified type   [employer], Erin E, PA-C   6 months ago Hypothyroidism, unspecified type   [employer], [place], DO   8 months ago Tobacco abuse   [facility] [place], [place], DO   1 year ago Welcome to [employer] preventive visit   [employer], Oralia Rud, DO       Future Appointments  In 1 week Duke Salvia, MD [facility] A Dept Of [place]. [facility]   In 2 weeks Johnson, Megan P, DO [employer] [facility], [facility]   In 5 months Johnson, Megan P, DO [employer] [facility], [facility]             Signed Prescriptions Disp Refills   pantoprazole (PROTONIX) 20 MG tablet 90 tablet 3    Sig: Take 1 tablet (20 mg total) by mouth daily.     Gastroenterology: Proton Pump Inhibitors Passed - 04/17/2022  5:30 PM      Passed - Valid encounter within last 12 months    Recent Outpatient Visits           3 weeks ago Acute non-recurrent maxillary sinusitis   [facility] [place], Megan P, DO   1 month ago Upper respiratory tract infection, unspecified type   [facility] [place], Erin E, PA-C   6 months ago Hypothyroidism, unspecified type   [employer], [place], DO   8 months ago Tobacco abuse   [employer], [place], DO   1 year ago Welcome to [employer] preventive visit   [employer], Oralia Rud, DO       Future Appointments             In 1 week Duke Salvia, MD [facility] A Dept Of [place]. [facility]   In 2 weeks Laural Benes, Oralia Rud, DO [employer] [facility]   In 5 months Laural Benes, Oralia Rud, DO [employer] [facility], [facility]

## 2022-04-17 NOTE — Telephone Encounter (Signed)
Future visit in 2 weeks Requested Prescriptions  Pending Prescriptions Disp Refills   levothyroxine (SYNTHROID) 125 MCG tablet 90 tablet 3    Sig: Take 1 tablet (125 mcg total) by mouth daily with breakfast.     Endocrinology:  Hypothyroid Agents Passed - 04/17/2022  5:30 PM      Passed - TSH in normal range and within 360 days    TSH  Date Value Ref Range Status  03/26/2022 3.380 0.450 - 4.500 uIU/mL Final         Passed - Valid encounter within last 12 months    Recent Outpatient Visits           3 weeks ago Acute non-recurrent maxillary sinusitis   [facility] [place], Megan P, DO   1 month ago Upper respiratory tract infection, unspecified type   [employer], Erin E, PA-C   6 months ago Hypothyroidism, unspecified type   [employer], [place], DO   8 months ago Tobacco abuse   [facility] [place], [place], DO   1 year ago Welcome to [employer] preventive visit   [employer], [place], DO       Future Appointments             In 1 week Duke Salvia, MD [facility] Dept Of [place]. [facility]   In 2 weeks Johnson, Megan P, DO [facility]   In 5 months Johnson, Megan P, DO [facility]             albuterol (VENTOLIN HFA) 108 (90 Base) MCG/ACT inhaler 8 g 2    Sig: Inhale 2 puffs into the lungs every 6 (six) hours as needed for wheezing or shortness of breath.     Pulmonology:  Beta Agonists 2 Passed - 04/17/2022  5:30 PM      Passed - Last BP in normal range    BP Readings from Last 1 Encounters:  04/06/22 124/84         Passed - Last Heart Rate in normal range    Pulse Readings from Last 1 Encounters:  04/06/22 70         Passed - Valid encounter within last 12 months    Recent Outpatient Visits           3 weeks ago Acute non-recurrent maxillary sinusitis   [facility] [place], Megan P, DO    1 month ago Upper respiratory tract infection, unspecified type   [employer], Erin E, PA-C   6 months ago Hypothyroidism, unspecified type   [employer], [place], DO   8 months ago Tobacco abuse   [facility] [place], [place], DO   1 year ago Welcome to [employer] preventive visit   [employer], [place], DO       Future Appointments             In 1 week Duke Salvia, MD [facility] Dept Of [place]. [facility]   In 2 weeks Johnson, Megan P, DO [facility]   In 5 months Johnson, Megan P, DO [facility]             atorvastatin (LIPITOR) 40 MG tablet 90 tablet 1    Sig: Take 1 tablet (40 mg total) by mouth daily.  Cardiovascular:  Antilipid - Statins Failed - 04/17/2022  5:30 PM      Failed - Lipid Panel in normal range within the last 12 months    Cholesterol, Total  Date Value Ref Range Status  03/26/2022 180 100 - 199 mg/dL Final   LDL Chol Calc (NIH)  Date Value Ref Range Status  03/26/2022 105 (H) 0 - 99 mg/dL Final   Direct LDL  Date Value Ref Range Status  03/18/2007 150.0 mg/dL Final    Comment:    See lab report for associated comment(s)   HDL  Date Value Ref Range Status  03/26/2022 55 >39 mg/dL Final   Triglycerides  Date Value Ref Range Status  03/26/2022 112 0 - 149 mg/dL Final         Passed - Patient is not pregnant      Passed - Valid encounter within last 12 months    Recent Outpatient Visits           3 weeks ago Acute non-recurrent maxillary sinusitis   [facility] [place], Megan P, DO   1 month ago Upper respiratory tract infection, unspecified type   [employer], Erin E, PA-C   6 months ago Hypothyroidism, unspecified type   [employer], [place], DO   8 months ago Tobacco abuse   [facility] [place], [place], DO   1 year ago  Welcome to [employer] preventive visit   [employer], [place], DO       Future Appointments             In 1 week Duke Salvia, MD [facility] Dept Of [place]. [facility]   In 2 weeks Johnson, Megan P, DO [facility]   In 5 months Johnson, Megan P, DO [facility]             pantoprazole (PROTONIX) 20 MG tablet 90 tablet 3    Sig: Take 1 tablet (20 mg total) by mouth daily.     Gastroenterology: Proton Pump Inhibitors Passed - 04/17/2022  5:30 PM      Passed - Valid encounter within last 12 months    Recent Outpatient Visits           3 weeks ago Acute non-recurrent maxillary sinusitis   [facility] [place], Megan P, DO   1 month ago Upper respiratory tract infection, unspecified type   [facility] [place], Erin E, PA-C   6 months ago Hypothyroidism, unspecified type   [employer], [place], DO   8 months ago Tobacco abuse   [employer], [place], DO   1 year ago Welcome to [employer] preventive visit   [employer], Oralia Rud, DO       Future Appointments             In 1 week Duke Salvia, MD [facility] A Dept Of [place]. [facility]   In 2 weeks Johnson, Megan P, DO [facility]   In 5 months Johnson, Megan P, DO [facility]             citalopram (CELEXA) 40 MG tablet 135 tablet 1    Sig: Take 1.5 tablets (60 mg total) by mouth daily.     Psychiatry:  Antidepressants - SSRI Passed - 04/17/2022  5:30 PM      Passed - Completed PHQ-2 or PHQ-9 in the last 360 days      Passed - Valid encounter within last 6 months    Recent Outpatient Visits           3 weeks ago Acute non-recurrent maxillary sinusitis   [facility] [place], Megan P, DO   1 month ago Upper respiratory tract infection, unspecified type   [employer], Erin E, PA-C   6 months ago Hypothyroidism, unspecified type   [employer], [place], DO   8 months ago Tobacco abuse   [facility] [place], [place], DO   1 year ago Welcome to [employer] preventive visit   [employer], [place], DO       Future Appointments             In 1 week Duke Salvia, MD [facility] A Dept Of [place]. [facility]   In 2 weeks Laural Benes, Oralia Rud, DO [facility]   In 5 months Laural Benes, Oralia Rud, DO [facility], [facility]

## 2022-04-17 NOTE — Telephone Encounter (Signed)
Medication Refill - Medication: levothyroxine (SYNTHROID) 125 MCG tablet / albuterol (VENTOLIN HFA) 108 (90 Base) MCG/ACT inhaler /atorvastatin (LIPITOR) 40 MG tablet/patient doesn't know the name of the other 2, one for mood swings and the other for gerd.  Has the patient contacted their pharmacy? yes (Agent: If no, request that the patient contact the pharmacy for the refill. If patient does not wish to contact the pharmacy document the reason why and proceed with request.) (Agent: If yes, when and what did the pharmacy advise?)contact pcp  Preferred Pharmacy (with phone number or street name): [id number] [place], [place] - [address] Phone: [phone]  Fax: [phone]   Has the patient been seen for an appointment in the last year OR does the patient have an upcoming appointment? yes  Agent: Please be advised that RX refills may take up to 3 business days. We ask that you follow-up with your pharmacy.

## 2022-04-18 NOTE — Telephone Encounter (Signed)
That is an insurance issue, not something I can control

## 2022-04-18 NOTE — Telephone Encounter (Signed)
Patient was notified via MyChart. Advised to reach out to our office if patient has any questions.

## 2022-04-26 ENCOUNTER — Ambulatory Visit: Payer: BC Managed Care – PPO | Attending: Internal Medicine | Admitting: Internal Medicine

## 2022-04-26 ENCOUNTER — Encounter: Payer: Self-pay | Admitting: Internal Medicine

## 2022-04-26 VITALS — BP 130/80 | HR 71 | Ht 65.0 in | Wt 211.0 lb

## 2022-04-26 DIAGNOSIS — I480 Paroxysmal atrial fibrillation: Secondary | ICD-10-CM | POA: Diagnosis not present

## 2022-04-26 DIAGNOSIS — I5032 Chronic diastolic (congestive) heart failure: Secondary | ICD-10-CM | POA: Diagnosis not present

## 2022-04-26 MED ORDER — FUROSEMIDE 20 MG PO TABS: 20.0000 mg | ORAL_TABLET | ORAL | 3 refills | Status: DC

## 2022-04-26 NOTE — Patient Instructions (Addendum)
Medication Instructions:  - Your physician recommends that you continue on your current medications as directed. Please refer to the Current Medication list given to you today.  *If you need a refill on your cardiac medications before your next appointment, please call your pharmacy*   Lab Work: - none ordered  If you have labs (blood work) drawn today and your tests are completely normal, you will receive your results only by: MyChart Message (if you have MyChart) OR A paper copy in the mail If you have any lab test that is abnormal or we need to change your treatment, we will call you to review the results.   Testing/Procedures: -You have been referred to cardiac rehab. This is a combination program including monitored exercise, dietary education, and support group. We strongly recommend participating in the program. Expect a phone call from them in approximately 2 weeks.    If you have not heard from the Cardiac Rehab Department at [facility] and it has been more than 2 weeks, please call them directly at [phone]    Follow-Up: At [facility], you and your health needs are our priority.  As part of our continuing mission to provide you with exceptional heart care, we have created designated Provider Care Teams.  These Care Teams include your primary Cardiologist (physician) and Advanced Practice Providers (APPs -  Physician Assistants and Nurse Practitioners) who all work together to provide you with the care you need, when you need it.  We recommend signing up for the patient portal called "MyChart".  Sign up information is provided on this After Visit Summary.  MyChart is used to connect with patients for Virtual Visits (Telemedicine).  Patients are able to view lab/test results, encounter notes, upcoming appointments, etc.  Non-urgent messages can be sent to your provider as well.   To learn more about what you can do with MyChart, go to [email or web address].    Your  next appointment:   6 month(s)  The format for your next appointment:   In Person  Provider:   Sherryl Manges, MD    Other Instructions N/a  Important Information About Sugar

## 2022-04-26 NOTE — Progress Notes (Signed)
ELECTROPHYSIOLOGY OFFICE NOTE  [name], MRN: [id number], DOB/AGE: [id number] 66 y.o. Admit date: (Not on file) Date of Consult: 04/26/2022  Primary Physician: Dorcas Carrow, DO Primary Cardiologist: sk        HPI  [name] SHEVAWN [name] is a 66 y.o. female seen follow-up  for undocumented atrial fibrillation with history dates back to the 90s (in her 75s).  She has a strong family history of early onset atrial fibrillation.     Interestingly, her episodes can be sometimes terminated in the first couple of minutes with Valsalva or cough.  If not then may seem to have a change in character as she developed tunnel vision and lightheadedness.  When initially seen,2017  given the infrequency of events, we had elected to go from propafenone daily--propafenone as needed.     With complaints of dyspnea, undertook PFTs.  "Mildly abnormal" can refer her back to her pulmonologist, she elected to not see them and has been seen subsequently by Dr. Jayme Cloud.  Note reviewed, unfortunately incomplete  Breathing has improved initially with diuretics.  Now has an inhaler.  Saw Dr. Jayme Cloud.  Sleep study is on the docket.  Hypercholesterolemia and now at target  Chest imaging reviewed, no aortic or coronary artery calcification described. DATE TEST EF   3/09 Echo   65 %   5/14 Myoview   75 % No ischemia  8/23 Echo  60-65%    Date Cr K TSH LDL Hgb  7//21 1.12 4.1 2.55 179 15.3  9/22 0.81 4.4 1.88 82 15.4  11/23 1.04 4.7 3.38 105 15.4    Thromboembolic risk factors (age-9, HTN-1, Gender-1) for a CHADSVASc Score of 2   Past Medical History:  Diagnosis Date   Anxiety state, unspecified    Back pain    Chest pain    a. 2008 Cath: reportedly nl;  b. 09/2012 Lexi MV: EF 75%, soft tissue attenuation->Low risk.   COPD (chronic obstructive pulmonary disease) (HCC)    CTS (carpal tunnel syndrome)    Depression    GERD (gastroesophageal reflux disease)    Migraines    Neck  pain    Nontoxic multinodular goiter    Other and unspecified hyperlipidemia    Other and unspecified ovarian cyst    left   Other chest pain    PAF (paroxysmal atrial fibrillation) (HCC)    a. 07/2007 Echo: EF 65%, mildly dil LA;  b. currently on propafenone;  c. CHA2DS2VASc = 1 (gender)-->No anticoagulation.   PTSD (post-traumatic stress disorder)    Tobacco use    Tobacco use disorder    Unspecified hypothyroidism    Unspecified sleep apnea    resolved s/p UPPP surgery   Vitamin B12 deficiency       Surgical History:  Past Surgical History:  Procedure Laterality Date   bladder tac     CARDIAC CATHETERIZATION  08/12/06   neg. Dr. Welton Flakes, cardio   CHOLECYSTECTOMY     FOOT SURGERY     bone removal from her foot   MRI lumber spine     03/12/06-abn   MRI pelvis  05/02/06   abn-[place], neurosurg   NASAL POLYP SURGERY     nasal ?   PARATHYROIDECTOMY     stress cardiolite  10/06/96   nl, EF 62%   stress myoview  10/25/03   Dr. Park Breed, nl   THYROIDECTOMY  5/92   massive goiter   TONSILLECTOMY     TUBAL  LIGATION  1988   UVULOPALATOPHARYNGOPLASTY     and tonsilectomy. [name] 10/04     Home Meds: Current Meds  Medication Sig   albuterol (VENTOLIN HFA) 108 (90 Base) MCG/ACT inhaler Inhale 2 puffs into the lungs every 6 (six) hours as needed for wheezing or shortness of breath.   atorvastatin (LIPITOR) 40 MG tablet Take 1 tablet (40 mg total) by mouth daily.   citalopram (CELEXA) 40 MG tablet Take 1.5 tablets (60 mg total) by mouth daily.   furosemide (LASIX) 20 MG tablet Take 1 tablet (20 mg total) by mouth 3 (three) times a week.   levothyroxine (SYNTHROID) 125 MCG tablet TAKE ONE TABLET BY MOUTH DAILY WITH BREAKFAST   pantoprazole (PROTONIX) 20 MG tablet Take 1 tablet (20 mg total) by mouth daily.   propafenone (RYTHMOL) 225 MG tablet TAKE 1 TABLET BY MOUTH DAILY AS NEEDED FOR AFIB   Spacer/Aero-Holding Chambers DEVI 1 Device by Does not apply route as needed.     Allergies:  Allergies  Allergen Reactions   Bee Venom Anaphylaxis   Penicillins Rash   Sulfonamide Derivatives Rash   Cardizem  [Diltiazem Hcl] Other (See Comments)    Other Reaction: severe hypotension w/ IV dose   Sulfa Antibiotics Hives   Tape Rash    Uncoded Allergy. Allergen: tape & bandaids          ROS:  Please see the history of present illness.     All other systems reviewed and negative.   BP 130/80 (BP Location: Left Arm, Patient Position: Sitting, Cuff Size: Normal)   Pulse 71   Ht 5\' 5"  (1.651 m)   Wt 211 lb (95.7 kg)   SpO2 98%   BMI 35.11 kg/m   Well developed and nourished in no acute distress HENT normal Neck supple with J Clear Regular rate and rhythm, no murmurs or gallops Abd-soft with active BS No Clubbing cyanosis edema Skin-warm and dry A & Oriented  Grossly normal sensory and motor function  ECG sinus at 71 Intervals 15/09/42 Axis left -39  Assessment and Plan:   Atrial fibrillation paroxysmal/regular S tachypalpitations  Elevated blood pressure  Hypercholesterolemia  Dyspnea on exertion/HFpEF  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  Lymphocytosis-resolved   Infrequent interval palpitations.  Question mechanism.  Breathing is better.  Have encouraged her to take her diuretics 3 times a week.  Evaluation from Dr. 17/09/42 included the concerns of weight and look into whether the patient is a candidate for cardiopulmonary rehab for exercise   Jayme Cloud

## 2022-05-03 ENCOUNTER — Telehealth (INDEPENDENT_AMBULATORY_CARE_PROVIDER_SITE_OTHER): Payer: BC Managed Care – PPO | Admitting: Family Medicine

## 2022-05-03 ENCOUNTER — Encounter: Payer: Self-pay | Admitting: Family Medicine

## 2022-05-03 DIAGNOSIS — Z720 Tobacco use: Secondary | ICD-10-CM

## 2022-05-03 DIAGNOSIS — F17210 Nicotine dependence, cigarettes, uncomplicated: Secondary | ICD-10-CM

## 2022-05-03 MED ORDER — NICOTINE 7 MG/24HR TD PT24: 7.0000 mg | MEDICATED_PATCH | Freq: Every day | TRANSDERMAL | 1 refills | Status: DC

## 2022-05-03 NOTE — Progress Notes (Signed)
There were no vitals taken for this visit.   Subjective:    [name], female    DOB: 04-04-1956, 66 y.o.   MRN: [id number]  HPI: [name] is a 66 y.o. female  Chief Complaint  Patient presents with   Nicotine Dependence   SMOKING CESSATION Smoking Status: current some day smoker Smoking Amount: pack a week Smoking Onset: 66yo, quit for 8-10 years and picked it up Smoking Quit Date: not set Smoking triggers: stress, being in the car Type of tobacco use: cigarettes Children in the house: no Other household members who smoke: no Treatments attempted: cold [place] Pneumovax:   Relevant past medical, surgical, family and social history reviewed and updated as indicated. Interim medical history since our last visit reviewed. Allergies and medications reviewed and updated.  Review of Systems  Constitutional: Negative.   Respiratory: Negative.    Cardiovascular: Negative.   Gastrointestinal: Negative.   Musculoskeletal: Negative.   Psychiatric/Behavioral: Negative.      Per HPI unless specifically indicated above     Objective:    There were no vitals taken for this visit.  Wt Readings from Last 3 Encounters:  04/26/22 211 lb (95.7 kg)  04/06/22 212 lb 9.6 oz (96.4 kg)  03/26/22 210 lb (95.3 kg)    Physical Exam Vitals and nursing note reviewed.  Constitutional:      General: She is not in acute distress.    Appearance: Normal appearance. She is not ill-appearing, toxic-appearing or diaphoretic.  HENT:     Head: Normocephalic and atraumatic.     Right Ear: External ear normal.     Left Ear: External ear normal.     Nose: Nose normal.     Mouth/Throat:     Mouth: Mucous membranes are moist.     Pharynx: Oropharynx is clear.  Eyes:     General: No scleral icterus.       Right eye: No discharge.        Left eye: No discharge.     Conjunctiva/sclera: Conjunctivae normal.     Pupils: Pupils are equal, round, and reactive to light.   Pulmonary:     Effort: Pulmonary effort is normal. No respiratory distress.     Comments: Speaking in full sentences Musculoskeletal:        General: Normal range of motion.     Cervical back: Normal range of motion.  Skin:    Coloration: Skin is not jaundiced or pale.     Findings: No bruising, erythema, lesion or rash.  Neurological:     Mental Status: She is alert and oriented to person, place, and time. Mental status is at baseline.  Psychiatric:        Mood and Affect: Mood normal.        Behavior: Behavior normal.        Thought Content: Thought content normal.        Judgment: Judgment normal.     Results for orders placed or performed in visit on 04/06/22  Nitric oxide  Result Value Ref Range   Nitric Oxide 29       Assessment & Plan:   Problem List Items Addressed This Visit       Other   Tobacco abuse - Primary    Interested in quitting. Currently smoking 1 pack per week. Will start 7mg  nicoderm and recheck 1 month. Call with any concerns.         Follow up plan: Return  in about 4 weeks (around 05/31/2022).    This visit was completed via video visit through MyChart due to the restrictions of the COVID-19 pandemic. All issues as above were discussed and addressed. Physical exam was done as above through visual confirmation on video through MyChart. If it was felt that the patient should be evaluated in the office, they were directed there. The patient verbally consented to this visit. Location of the patient: home Location of the provider: work Those involved with this call:  Provider: Olevia Perches, DO CMA: Malen Gauze, CMA Front Desk/Registration: [employer]  Time spent on call:  15 minutes with patient face to face via video conference. More than 50% of this time was spent in counseling and coordination of care. 23 minutes total spent in review of patient's record and preparation of their chart.

## 2022-05-03 NOTE — Assessment & Plan Note (Signed)
Interested in quitting. Currently smoking 1 pack per week. Will start 7mg  nicoderm and recheck 1 month. Call with any concerns.

## 2022-05-04 NOTE — Progress Notes (Signed)
LVM asking patient to call back to schedule her follow up appointment 

## 2022-05-31 ENCOUNTER — Ambulatory Visit: Payer: BC Managed Care – PPO | Admitting: Pulmonary Disease

## 2022-05-31 ENCOUNTER — Encounter: Payer: Self-pay | Admitting: Family Medicine

## 2022-05-31 ENCOUNTER — Ambulatory Visit: Payer: BC Managed Care – PPO

## 2022-05-31 ENCOUNTER — Telehealth (INDEPENDENT_AMBULATORY_CARE_PROVIDER_SITE_OTHER): Payer: BC Managed Care – PPO | Admitting: Family Medicine

## 2022-05-31 DIAGNOSIS — T148XXA Other injury of unspecified body region, initial encounter: Secondary | ICD-10-CM

## 2022-05-31 DIAGNOSIS — Z720 Tobacco use: Secondary | ICD-10-CM

## 2022-05-31 DIAGNOSIS — F17210 Nicotine dependence, cigarettes, uncomplicated: Secondary | ICD-10-CM | POA: Diagnosis not present

## 2022-05-31 DIAGNOSIS — G4733 Obstructive sleep apnea (adult) (pediatric): Secondary | ICD-10-CM

## 2022-05-31 NOTE — Progress Notes (Signed)
There were no vitals taken for this visit.   Subjective:    [name], female    DOB: July 03, 1955, 67 y.o.   MRN: [id number]  HPI: [name] is a 67 y.o. female  Chief Complaint  Patient presents with   Nicotine Dependence    Patient says she has stopped taking the Nicoderm as it made her "sick as a dog" and had severe headaches.    SMOKING CESSATION- did not tolerate the patches, gave her a bad headache Smoking Status: current some day smoker Smoking Amount: 1/2 pack per week Smoking Onset: 67yo, quit for 8-10 years and picked it up  Smoking Quit Date: not set Smoking triggers: stress, being in the car  Type of tobacco use: cigarettes Children in the house: no Other household members who smoke: no Treatments attempted: cold [place], patches (bad reactions) Pneumovax: up to date  Bruise on inside of her L calf for about a week and has been hurting and burning. Has history of superficial clots- would like [place] if not getting better. No other concerns.   Relevant past medical, surgical, family and social history reviewed and updated as indicated. Interim medical history since our last visit reviewed. Allergies and medications reviewed and updated.  Review of Systems  Constitutional: Negative.   Respiratory: Negative.    Cardiovascular: Negative.   Gastrointestinal: Negative.   Musculoskeletal: Negative.   Psychiatric/Behavioral: Negative.      Per HPI unless specifically indicated above     Objective:    There were no vitals taken for this visit.  Wt Readings from Last 3 Encounters:  04/26/22 211 lb (95.7 kg)  04/06/22 212 lb 9.6 oz (96.4 kg)  03/26/22 210 lb (95.3 kg)    Physical Exam Vitals and nursing note reviewed.  Constitutional:      General: She is not in acute distress.    Appearance: Normal appearance. She is not ill-appearing, toxic-appearing or diaphoretic.  HENT:     Head: Normocephalic and atraumatic.     Right Ear: External  ear normal.     Left Ear: External ear normal.     Nose: Nose normal.     Mouth/Throat:     Mouth: Mucous membranes are moist.     Pharynx: Oropharynx is clear.  Eyes:     General: No scleral icterus.       Right eye: No discharge.        Left eye: No discharge.     Conjunctiva/sclera: Conjunctivae normal.     Pupils: Pupils are equal, round, and reactive to light.  Pulmonary:     Effort: Pulmonary effort is normal. No respiratory distress.     Comments: Speaking in full sentences Musculoskeletal:        General: Normal range of motion.     Cervical back: Normal range of motion.  Skin:    Coloration: Skin is not jaundiced or pale.     Findings: No bruising, erythema, lesion or rash.  Neurological:     Mental Status: She is alert and oriented to person, place, and time. Mental status is at baseline.  Psychiatric:        Mood and Affect: Mood normal.        Behavior: Behavior normal.        Thought Content: Thought content normal.        Judgment: Judgment normal.     Results for orders placed or performed in visit on 04/06/22  Nitric oxide  Result Value Ref Range   Nitric Oxide 29       Assessment & Plan:   Problem List Items Addressed This Visit       Other   Tobacco abuse - Primary    Has continued to cut down. Doing well. No concerns. Continue to monitor.       Other Visit Diagnoses     Bruise       Will monitor until middle of next week- if not getting better, will set up [place]. Call with any concerns.        Follow up plan: Return if symptoms worsen or fail to improve.   This visit was completed via video visit through MyChart due to the restrictions of the COVID-19 pandemic. All issues as above were discussed and addressed. Physical exam was done as above through visual confirmation on video through MyChart. If it was felt that the patient should be evaluated in the office, they were directed there. The patient verbally consented to this visit. Location  of the patient: parking lot Location of the provider: work Those involved with this call:  Provider: Park Liter, DO CMA: Irena Reichmann, [place] Desk/Registration: [employer]  Time spent on call:  15 minutes with patient face to face via video conference. More than 50% of this time was spent in counseling and coordination of care. 23 minutes total spent in review of patient's record and preparation of their chart.

## 2022-05-31 NOTE — Assessment & Plan Note (Signed)
Has continued to cut down. Doing well. No concerns. Continue to monitor.

## 2022-06-08 ENCOUNTER — Telehealth: Payer: Self-pay

## 2022-06-08 DIAGNOSIS — G4733 Obstructive sleep apnea (adult) (pediatric): Secondary | ICD-10-CM

## 2022-06-08 NOTE — Telephone Encounter (Signed)
ATC patient. LVM to return call. 

## 2022-06-08 NOTE — Telephone Encounter (Signed)
Tyler Pita, MD  [facility] Dibbles, [place]; Hettie Holstein, [place] Patient has severe obstructive sleep apnea with AHI of 40, recommend auto CPAP at 5 to 20 cm H2O, heated humidity and mask of choice.  Dr. Darnell Level.       Previous Messages    ----- Message ----- From: Harland German Sent: 06/08/2022   1:54 PM EST To: Tyler Pita, MD Subject: Scan                                          Scanned by Harland German on 06/08/2022 at  1:54 PM to the following: Home sleep test [[id number]] on 05/31/2022

## 2022-06-11 ENCOUNTER — Telehealth: Payer: Self-pay | Admitting: Pulmonary Disease

## 2022-06-11 DIAGNOSIS — G4733 Obstructive sleep apnea (adult) (pediatric): Secondary | ICD-10-CM

## 2022-06-11 NOTE — Telephone Encounter (Signed)
Please see 06/08/2022 phone note.   Tyler Pita, MD  Linwood Dibbles, [place]; Hettie Holstein, [place] Patient has severe obstructive sleep apnea with AHI of 40, recommend auto CPAP at 5 to 20 cm H2O, heated humidity and mask of choice.  Dr. Darnell Level.          Patient is aware of results and voiced her understanding.  She agrees with plan.  Order has been placed.  She will contact our office to schedule OV once setup on machine.  Nothing further needed.

## 2022-06-11 NOTE — Telephone Encounter (Signed)
Lm x2 for patient.  Letter mailed to address on file.  Will close encounter per office protocol.  

## 2022-06-16 ENCOUNTER — Encounter: Payer: Self-pay | Admitting: Pulmonary Disease

## 2022-06-21 ENCOUNTER — Ambulatory Visit: Payer: BC Managed Care – PPO | Admitting: Pulmonary Disease

## 2022-07-02 ENCOUNTER — Telehealth: Payer: Self-pay | Admitting: Pulmonary Disease

## 2022-07-02 NOTE — Telephone Encounter (Signed)
[name], can you help with this? Order placed 06/11/2022

## 2022-07-02 NOTE — Telephone Encounter (Signed)
I sent email to Sherley Bounds with [facility] to check on this issue

## 2022-07-02 NOTE — Telephone Encounter (Signed)
PT still has not rec'd her Cpap. Last call to [facility] They said someone would be calling her back. She asked why can't they do it now. They said because they wanted to be sure they had all the supplies.  Referral notes from earlier this mo by [place] indicate the following: Rec'd confirmation from Sherley Bounds that she now has this order   PT has called and called and just gets a busy signal. Pls call to advise.   She will have to resched her "Cpap FU appt now as well.

## 2022-07-03 NOTE — Telephone Encounter (Signed)
[name] from [facility] stated "[name] is on the phone with her right now going over her financials and to get her scheduled."

## 2022-07-17 ENCOUNTER — Other Ambulatory Visit: Payer: Self-pay

## 2022-07-17 DIAGNOSIS — Z87891 Personal history of nicotine dependence: Secondary | ICD-10-CM

## 2022-07-17 DIAGNOSIS — F17210 Nicotine dependence, cigarettes, uncomplicated: Secondary | ICD-10-CM

## 2022-07-18 ENCOUNTER — Other Ambulatory Visit: Payer: Self-pay | Admitting: *Deleted

## 2022-07-18 DIAGNOSIS — J449 Chronic obstructive pulmonary disease, unspecified: Secondary | ICD-10-CM

## 2022-07-18 DIAGNOSIS — R0602 Shortness of breath: Secondary | ICD-10-CM

## 2022-07-26 ENCOUNTER — Encounter: Payer: Self-pay | Admitting: *Deleted

## 2022-07-26 ENCOUNTER — Encounter: Payer: Medicare Other | Attending: Pulmonary Disease | Admitting: *Deleted

## 2022-07-26 DIAGNOSIS — R0602 Shortness of breath: Secondary | ICD-10-CM

## 2022-07-26 DIAGNOSIS — J449 Chronic obstructive pulmonary disease, unspecified: Secondary | ICD-10-CM

## 2022-07-26 NOTE — Progress Notes (Signed)
Initial phone call completed. Diagnosis can be found in [facility] 12/1. EP Orientation scheduled for Monday 4/1 at [place] is a current tobacco user. Intervention for tobacco cessation was provided at the initial medical review. She was asked about readiness to quit and reported she is ready to quit . Patient was advised and educated about tobacco cessation using combination therapy, tobacco cessation classes, quit line, and quit smoking apps. Patient demonstrated understanding of this material. Staff will continue to provide encouragement and follow up with the patient throughout the program.

## 2022-08-01 ENCOUNTER — Encounter: Payer: Self-pay | Admitting: Acute Care

## 2022-08-01 ENCOUNTER — Ambulatory Visit (INDEPENDENT_AMBULATORY_CARE_PROVIDER_SITE_OTHER): Payer: BC Managed Care – PPO | Admitting: Acute Care

## 2022-08-01 DIAGNOSIS — F17210 Nicotine dependence, cigarettes, uncomplicated: Secondary | ICD-10-CM

## 2022-08-01 NOTE — Progress Notes (Signed)
Virtual Visit via Telephone Note  I connected with [name] on 08/01/22 at  9:30 AM EDT by telephone and verified that I am speaking with the correct person using two identifiers.  Location: Patient:  At home  Provider: [place], [place], [place], [address]    I discussed the limitations, risks, security and privacy concerns of performing an evaluation and management service by telephone and the availability of in person appointments. I also discussed with the patient that there may be a patient responsible charge related to this service. The patient expressed understanding and agreed to proceed.     Shared Decision Making Visit Lung Cancer Screening Program [phone])   Eligibility: Age 67 y.o. Pack Years Smoking History Calculation 59 pack year smoking history (# packs/per year x # years smoked) Recent History of coughing up blood  no Unexplained weight loss? no ( >Than 15 pounds within the last 6 months ) Prior History Lung / other cancer no (Diagnosis within the last 5 years already requiring surveillance chest CT Scans). Smoking Status Current Smoker Former Smokers: Years since quit:  NA  Quit Date:  NA  Visit Components: Discussion included one or more decision making aids. yes Discussion included risk/benefits of screening. yes Discussion included potential follow up diagnostic testing for abnormal scans. yes Discussion included meaning and risk of over diagnosis. yes Discussion included meaning and risk of False Positives. yes Discussion included meaning of total radiation exposure. yes  Counseling Included: Importance of adherence to annual lung cancer LDCT screening. yes Impact of comorbidities on ability to participate in the program. yes Ability and willingness to under diagnostic treatment. yes  Smoking Cessation Counseling: Current Smokers:  Discussed importance of smoking cessation. yes Information about tobacco cessation classes and  interventions provided to patient. yes Patient provided with "ticket" for LDCT Scan. yes Symptomatic Patient. no  Counseling NA Diagnosis Code: Tobacco Use Z72.0 Asymptomatic Patient yes  Counseling (Intermediate counseling: > three minutes counseling) [id number] Former Smokers:  Discussed the importance of maintaining cigarette abstinence. yes Diagnosis Code: Personal History of Nicotine Dependence. [id number] Information about tobacco cessation classes and interventions provided to patient. Yes Patient provided with "ticket" for LDCT Scan. yes Written Order for Lung Cancer Screening with LDCT placed in Epic. Yes (CT Chest Lung Cancer Screening Low Dose W/O CM) [id number] Z12.2-Screening of respiratory organs Z87.891-Personal history of nicotine dependence  I have spent 25 minutes of face to face/ virtual visit   time with  [name] discussing the risks and benefits of lung cancer screening. We viewed / discussed a power point together that explained in detail the above noted topics. We paused at intervals to allow for questions to be asked and answered to ensure understanding.We discussed that the single most powerful action that she can take to decrease her risk of developing lung cancer is to quit smoking. We discussed whether or not she is ready to commit to setting a quit date. We discussed options for tools to aid in quitting smoking including nicotine replacement therapy, non-nicotine medications, support groups, Quit Smart classes, and behavior modification. We discussed that often times setting smaller, more achievable goals, such as eliminating 1 cigarette a day for a week and then 2 cigarettes a day for a week can be helpful in slowly decreasing the number of cigarettes smoked. This allows for a sense of accomplishment as well as providing a clinical benefit. I provided  her  with smoking cessation  information  with contact information for  community resources, classes, free nicotine replacement  therapy, and access to mobile apps, text messaging, and on-line smoking cessation help. I have also provided  her  the office contact information in the event she needs to contact me, or the screening staff. We discussed the time and location of the scan, and that either Doroteo Glassman RN, Joella Prince, RN  or I will call / send a letter with the results within 24-72 hours of receiving them. The patient verbalized understanding of all of  the above and had no further questions upon leaving the office. They have my contact information in the event they have any further questions.  I spent 3 minutes counseling on smoking cessation and the health risks of continued tobacco abuse.  I explained to the patient that there has been a high incidence of coronary artery disease noted on these exams. I explained that this is a non-gated exam therefore degree or severity cannot be determined. This patient is on statin therapy. I have asked the patient to follow-up with their PCP regarding any incidental finding of coronary artery disease and management with diet or medication as their PCP  feels is clinically indicated. The patient verbalized understanding of the above and had no further questions upon completion of the visit.      Magdalen Spatz, NP 08/01/2022

## 2022-08-01 NOTE — Patient Instructions (Signed)
Thank you for participating in the [facility] Screening Program. It was our pleasure to meet you today. We will call you with the results of your scan within the next few days. Your scan will be assigned a Lung RADS category score by the physicians reading the scans.  This Lung RADS score determines follow up scanning.  See below for description of categories, and follow up screening recommendations. We will be in touch to schedule your follow up screening annually or based on recommendations of our providers. We will fax a copy of your scan results to your Primary Care Physician, or the physician who referred you to the program, to ensure they have the results. Please call the office if you have any questions or concerns regarding your scanning experience or results.  Our office number is [phone]. Please speak with Denise Phelps, RN. , or  Denise Buckner RN, They are  our Lung Cancer Screening RN.'s If They are unavailable when you call, Please leave a message on the voice mail. We will return your call at our earliest convenience.This voice mail is monitored several times a day.  Remember, if your scan is normal, we will scan you annually as long as you continue to meet the criteria for the program. (Age 50-80, Current smoker or smoker who has quit within the last 15 years). If you are a smoker, remember, quitting is the single most powerful action that you can take to decrease your risk of lung cancer and other pulmonary, breathing related problems. We know quitting is hard, and we are here to help.  Please let us know if there is anything we can do to help you meet your goal of quitting. If you are a former smoker, congratulations. We are proud of you! Remain smoke free! Remember you can refer friends or family members through the number above.  We will screen them to make sure they meet criteria for the program. Thank you for helping us take better care of you by  participating in Lung Screening.  You can receive free nicotine replacement therapy ( patches, gum or mints) by calling 1-800-QUIT NOW. Please call so we can get you on the path to becoming  a non-smoker. I know it is hard, but you can do this!  Lung RADS Categories:  Lung RADS 1: no nodules or definitely non-concerning nodules.  Recommendation is for a repeat annual scan in 12 months.  Lung RADS 2:  nodules that are non-concerning in appearance and behavior with a very low likelihood of becoming an active cancer. Recommendation is for a repeat annual scan in 12 months.  Lung RADS 3: nodules that are probably non-concerning , includes nodules with a low likelihood of becoming an active cancer.  Recommendation is for a 6-month repeat screening scan. Often noted after an upper respiratory illness. We will be in touch to make sure you have no questions, and to schedule your 6-month scan.  Lung RADS 4 A: nodules with concerning findings, recommendation is most often for a follow up scan in 3 months or additional testing based on our provider's assessment of the scan. We will be in touch to make sure you have no questions and to schedule the recommended 3 month follow up scan.  Lung RADS 4 B:  indicates findings that are concerning. We will be in touch with you to schedule additional diagnostic testing based on our provider's  assessment of the scan.  Other options for assistance in smoking cessation (   As covered by your insurance benefits)  Hypnosis for smoking cessation  [employer] [phone]  Acupuncture for smoking cessation  [facility] [phone]   

## 2022-08-02 ENCOUNTER — Ambulatory Visit
Admission: RE | Admit: 2022-08-02 | Discharge: 2022-08-02 | Disposition: A | Discharge status: Home / Self Care | Payer: BC Managed Care – PPO | Source: Ambulatory Visit | Attending: Acute Care | Admitting: Acute Care

## 2022-08-02 DIAGNOSIS — F17210 Nicotine dependence, cigarettes, uncomplicated: Secondary | ICD-10-CM | POA: Diagnosis not present

## 2022-08-02 DIAGNOSIS — Z122 Encounter for screening for malignant neoplasm of respiratory organs: Secondary | ICD-10-CM | POA: Insufficient documentation

## 2022-08-02 DIAGNOSIS — J439 Emphysema, unspecified: Secondary | ICD-10-CM | POA: Insufficient documentation

## 2022-08-02 DIAGNOSIS — Z87891 Personal history of nicotine dependence: Secondary | ICD-10-CM

## 2022-08-02 DIAGNOSIS — I700 Atherosclerosis of aorta: Secondary | ICD-10-CM | POA: Insufficient documentation

## 2022-08-06 ENCOUNTER — Other Ambulatory Visit: Payer: Self-pay

## 2022-08-06 ENCOUNTER — Encounter: Payer: Self-pay | Admitting: Pulmonary Disease

## 2022-08-06 ENCOUNTER — Ambulatory Visit (INDEPENDENT_AMBULATORY_CARE_PROVIDER_SITE_OTHER): Payer: BC Managed Care – PPO | Admitting: Pulmonary Disease

## 2022-08-06 ENCOUNTER — Encounter: Payer: BC Managed Care – PPO | Attending: Pulmonary Disease

## 2022-08-06 VITALS — Ht 65.0 in | Wt 215.6 lb

## 2022-08-06 VITALS — BP 124/82 | HR 63 | Temp 97.7°F | Ht 65.0 in | Wt 215.6 lb

## 2022-08-06 DIAGNOSIS — F17210 Nicotine dependence, cigarettes, uncomplicated: Secondary | ICD-10-CM

## 2022-08-06 DIAGNOSIS — R0602 Shortness of breath: Secondary | ICD-10-CM

## 2022-08-06 DIAGNOSIS — G4733 Obstructive sleep apnea (adult) (pediatric): Secondary | ICD-10-CM

## 2022-08-06 DIAGNOSIS — J449 Chronic obstructive pulmonary disease, unspecified: Secondary | ICD-10-CM | POA: Diagnosis not present

## 2022-08-06 DIAGNOSIS — E669 Obesity, unspecified: Secondary | ICD-10-CM | POA: Diagnosis not present

## 2022-08-06 DIAGNOSIS — Z87891 Personal history of nicotine dependence: Secondary | ICD-10-CM

## 2022-08-06 MED ORDER — STIOLTO RESPIMAT 2.5-2.5 MCG/ACT IN AERS: 2.0000 | INHALATION_SPRAY | Freq: Every day | RESPIRATORY_TRACT | 0 refills | Status: DC

## 2022-08-06 NOTE — Patient Instructions (Signed)
Patient Instructions  Patient Details  Name: [name] MRN: [id number] Date of Birth: 10-13-1955 Referring Provider:  Tyler Pita, MD  Below are your personal goals for exercise, nutrition, and risk factors. Our goal is to help you stay on track towards obtaining and maintaining these goals. We will be discussing your progress on these goals with you throughout the program.  Initial Exercise Prescription:  Initial Exercise Prescription - 08/06/22 1100       Date of Initial Exercise RX and Referring Provider   Date 08/06/22    Referring Provider Dr. Vernard Gambles, MD      Oxygen   Maintain Oxygen Saturation 88% or higher      Treadmill   MPH 2.4    Grade 0    Minutes 15    METs 2.84      Recumbant Bike   Level 2    RPM 50    Watts 26    Minutes 15    METs 2.86      NuStep   Level 2    SPM 80    Minutes 15    METs 2.86      Biostep-RELP   Level 2    SPM 50    Minutes 15    METs 2.86      Prescription Details   Frequency (times per week) 2    Duration Progress to 30 minutes of continuous aerobic without signs/symptoms of physical distress      Intensity   THRR 40-80% of Max Heartrate 102-136    Ratings of Perceived Exertion 11-13    Perceived Dyspnea 0-4      Progression   Progression Continue to progress workloads to maintain intensity without signs/symptoms of physical distress.      Resistance Training   Training Prescription Yes    Weight 4 lb    Reps 10-15             Exercise Goals: Frequency: Be able to perform aerobic exercise two to three times per week in program working toward 2-5 days per week of home exercise.  Intensity: Work with a perceived exertion of 11 (fairly light) - 15 (hard) while following your exercise prescription.  We will make changes to your prescription with you as you progress through the program.   Duration: Be able to do 30 to 45 minutes of continuous aerobic exercise in addition to a 5 minute warm-up  and a 5 minute cool-down routine.   Nutrition Goals: Your personal nutrition goals will be established when you do your nutrition analysis with the dietician.  The following are general nutrition guidelines to follow: Cholesterol < 200mg /day Sodium < 1500mg /day Fiber: Women over 50 yrs - 21 grams per day  Personal Goals:  Personal Goals and Risk Factors at Admission - 08/06/22 1035       Core Components/Risk Factors/Patient Goals on Admission    Weight Management Yes;Weight Gain    Intervention Weight Management: Develop a combined nutrition and exercise program designed to reach desired caloric intake, while maintaining appropriate intake of nutrient and fiber, sodium and fats, and appropriate energy expenditure required for the weight goal.;Weight Management: Provide education and appropriate resources to help participant work on and attain dietary goals.;Weight Management/Obesity: Establish reasonable short term and long term weight goals.    Admit Weight 215 lb 9.6 oz (97.8 kg)    Goal Weight: Short Term 205 lb (93 kg)    Goal Weight: Long Term 175 lb (79.4  kg)    Expected Outcomes Short Term: Continue to assess and modify interventions until short term weight is achieved;Long Term: Adherence to nutrition and physical activity/exercise program aimed toward attainment of established weight goal;Weight Loss: Understanding of general recommendations for a balanced deficit meal plan, which promotes 1-2 lb weight loss per week and includes a negative energy balance of [phone] kcal/d;Understanding recommendations for meals to include 15-35% energy as protein, 25-35% energy from fat, 35-60% energy from carbohydrates, less than 200mg  of dietary cholesterol, 20-35 gm of total fiber daily;Understanding of distribution of calorie intake throughout the day with the consumption of 4-5 meals/snacks    Tobacco Cessation Yes    Number of packs per day --   inconsistent   Intervention Assist the  participant in steps to quit. Provide individualized education and counseling about committing to Tobacco Cessation, relapse prevention, and pharmacological support that can be provided by physician.;Advice worker, assist with locating and accessing local/national Quit Smoking programs, and support quit date choice.    Expected Outcomes Short Term: Will demonstrate readiness to quit, by selecting a quit date.;Short Term: Will quit all tobacco product use, adhering to prevention of relapse plan.;Long Term: Complete abstinence from all tobacco products for at least 12 months from quit date.    Improve shortness of breath with ADL's Yes    Intervention Provide education, individualized exercise plan and daily activity instruction to help decrease symptoms of SOB with activities of daily living.    Expected Outcomes Short Term: Improve cardiorespiratory fitness to achieve a reduction of symptoms when performing ADLs;Long Term: Be able to perform more ADLs without symptoms or delay the onset of symptoms    Hypertension Yes    Intervention Provide education on lifestyle modifcations including regular physical activity/exercise, weight management, moderate sodium restriction and increased consumption of fresh fruit, vegetables, and low fat dairy, alcohol moderation, and smoking cessation.;Monitor prescription use compliance.    Expected Outcomes Short Term: Continued assessment and intervention until BP is < 140/27mm HG in hypertensive participants. < 130/25mm HG in hypertensive participants with diabetes, heart failure or chronic kidney disease.;Long Term: Maintenance of blood pressure at goal levels.    Lipids Yes    Intervention Provide education and support for participant on nutrition & aerobic/resistive exercise along with prescribed medications to achieve LDL 70mg , HDL >40mg .    Expected Outcomes Short Term: Participant states understanding of desired cholesterol values and is compliant  with medications prescribed. Participant is following exercise prescription and nutrition guidelines.;Long Term: Cholesterol controlled with medications as prescribed, with individualized exercise RX and with personalized nutrition plan. Value goals: LDL < 70mg , HDL > 40 mg.             Tobacco Use Initial Evaluation: Social History   Tobacco Use  Smoking Status Every Day   Packs/day: 1.50   Years: 39.00   Additional pack years: 0.00   Total pack years: 58.50   Types: Cigarettes  Smokeless Tobacco Never  Tobacco Comments   2 cigarettes a day- 08/06/2022 khj    Exercise Goals and Review:  Exercise Goals     Row Name 08/06/22 1050             Exercise Goals   Increase Physical Activity Yes       Intervention Provide advice, education, support and counseling about physical activity/exercise needs.;Develop an individualized exercise prescription for aerobic and resistive training based on initial evaluation findings, risk stratification, comorbidities and participant's personal goals.  Expected Outcomes Short Term: Attend rehab on a regular basis to increase amount of physical activity.;Long Term: Add in home exercise to make exercise part of routine and to increase amount of physical activity.;Long Term: Exercising regularly at least 3-5 days a week.       Increase Strength and Stamina Yes       Intervention Provide advice, education, support and counseling about physical activity/exercise needs.;Develop an individualized exercise prescription for aerobic and resistive training based on initial evaluation findings, risk stratification, comorbidities and participant's personal goals.       Expected Outcomes Short Term: Increase workloads from initial exercise prescription for resistance, speed, and METs.;Short Term: Perform resistance training exercises routinely during rehab and add in resistance training at home;Long Term: Improve cardiorespiratory fitness, muscular endurance  and strength as measured by increased METs and functional capacity (6MWT)       Able to understand and use rate of perceived exertion (RPE) scale Yes       Intervention Provide education and explanation on how to use RPE scale       Expected Outcomes Short Term: Able to use RPE daily in rehab to express subjective intensity level;Long Term:  Able to use RPE to guide intensity level when exercising independently       Able to understand and use Dyspnea scale Yes       Intervention Provide education and explanation on how to use Dyspnea scale       Expected Outcomes Short Term: Able to use Dyspnea scale daily in rehab to express subjective sense of shortness of breath during exertion;Long Term: Able to use Dyspnea scale to guide intensity level when exercising independently       Knowledge and understanding of Target Heart Rate Range (THRR) Yes       Intervention Provide education and explanation of THRR including how the numbers were predicted and where they are located for reference       Expected Outcomes Short Term: Able to state/look up THRR;Long Term: Able to use THRR to govern intensity when exercising independently;Short Term: Able to use daily as guideline for intensity in rehab       Able to check pulse independently Yes       Intervention Provide education and demonstration on how to check pulse in carotid and radial arteries.;Review the importance of being able to check your own pulse for safety during independent exercise       Expected Outcomes Short Term: Able to explain why pulse checking is important during independent exercise;Long Term: Able to check pulse independently and accurately       Understanding of Exercise Prescription Yes       Intervention Provide education, explanation, and written materials on patient's individual exercise prescription       Expected Outcomes Short Term: Able to explain program exercise prescription;Long Term: Able to explain home exercise prescription  to exercise independently

## 2022-08-06 NOTE — Progress Notes (Signed)
Pulmonary Individual Treatment Plan  Patient Details  Name: [name] MRN: [id number] Date of Birth: 1955/08/29 Referring Provider:   Flowsheet Row Pulmonary Rehab from 08/06/2022 in [facility] Cardiac and Pulmonary Rehab  Referring Provider Dr. Vernard Gambles, MD       Initial Encounter Date:  Flowsheet Row Pulmonary Rehab from 08/06/2022 in [facility] Cardiac and Pulmonary Rehab  Date 08/06/22       Visit Diagnosis: Chronic obstructive pulmonary disease, unspecified COPD type  Patient's Home Medications on Admission:  Current Outpatient Medications:    albuterol (VENTOLIN HFA) 108 (90 Base) MCG/ACT inhaler, Inhale 2 puffs into the lungs every 6 (six) hours as needed for wheezing or shortness of breath., Disp: 8 g, Rfl: 2   atorvastatin (LIPITOR) 40 MG tablet, Take 1 tablet (40 mg total) by mouth daily., Disp: 90 tablet, Rfl: 1   citalopram (CELEXA) 40 MG tablet, Take 1.5 tablets (60 mg total) by mouth daily., Disp: 135 tablet, Rfl: 1   furosemide (LASIX) 20 MG tablet, Take 1 tablet (20 mg total) by mouth 3 (three) times a week., Disp: 45 tablet, Rfl: 3   levothyroxine (SYNTHROID) 125 MCG tablet, TAKE ONE TABLET BY MOUTH DAILY WITH BREAKFAST, Disp: 90 tablet, Rfl: 3   pantoprazole (PROTONIX) 20 MG tablet, Take 1 tablet (20 mg total) by mouth daily., Disp: 90 tablet, Rfl: 3   propafenone (RYTHMOL) 225 MG tablet, TAKE 1 TABLET BY MOUTH DAILY AS NEEDED FOR AFIB, Disp: 30 tablet, Rfl: 1   Spacer/Aero-Holding Chambers DEVI, 1 Device by Does not apply route as needed., Disp: 2 each, Rfl: 0   Tiotropium Bromide-Olodaterol (STIOLTO RESPIMAT) 2.5-2.5 MCG/ACT AERS, Inhale 2 puffs into the lungs daily., Disp: 4 g, Rfl: 0 No current facility-administered medications for this visit.  Facility-Administered Medications Ordered in Other Visits:    albuterol (PROVENTIL) (2.5 MG/3ML) 0.083% nebulizer solution 2.5 mg, 2.5 mg, Nebulization, Once, Deboraha Sprang, MD  Past Medical History: Past Medical  History:  Diagnosis Date   Anxiety state, unspecified    Back pain    Chest pain    a. 2008 Cath: reportedly nl;  b. 09/2012 Lexi MV: EF 75%, soft tissue attenuation->Low risk.   COPD (chronic obstructive pulmonary disease)    CTS (carpal tunnel syndrome)    Depression    GERD (gastroesophageal reflux disease)    Migraines    Neck pain    Nontoxic multinodular goiter    Other and unspecified hyperlipidemia    Other and unspecified ovarian cyst    left   Other chest pain    PAF (paroxysmal atrial fibrillation)    a. 07/2007 Echo: EF 65%, mildly dil LA;  b. currently on propafenone;  c. CHA2DS2VASc = 1 (gender)-->No anticoagulation.   PTSD (post-traumatic stress disorder)    Tobacco use    Tobacco use disorder    Unspecified hypothyroidism    Unspecified sleep apnea    resolved s/p UPPP surgery   Vitamin B12 deficiency     Tobacco Use: Social History   Tobacco Use  Smoking Status Every Day   Packs/day: 1.50   Years: 39.00   Additional pack years: 0.00   Total pack years: 58.50   Types: Cigarettes  Smokeless Tobacco Never  Tobacco Comments   2 cigarettes a day- 08/06/2022 khj    Labs: Review Flowsheet  More data exists      Latest Ref Rng & Units 05/09/2008 10/09/2017 11/20/2019 01/30/2021 03/26/2022  Labs for ITP Cardiac and Pulmonary Rehab  Cholestrol 100 -  199 mg/dL 171        ATP III CLASSIFICATION:  <200     mg/dL   Desirable  200-239  mg/dL   Borderline High  >=240    mg/dL   High         238  256  149  180   LDL (calc) 0 - 99 mg/dL 107        Total Cholesterol/HDL:CHD Risk Coronary Heart Disease Risk Table                     Men   Women  1/2 Average Risk   3.4   3.3  Average Risk       5.0   4.4  2 X Average Risk   9.6   7.1  3 X Average Risk  23.4   11.0        Use the calculated Patient Ratio above and the CHD Risk Table to determine the patient's CHD Risk.        ATP III CLASSIFICATION (LDL):  <100     mg/dL   Optimal  100-129  mg/dL   Near or  Above                    Optimal  130-159  mg/dL   Borderline  160-189  mg/dL   High  >190     mg/dL   Very High  163  179  82  105   HDL-C >39 mg/dL 36  45  41  39  55   Trlycerides 0 - 149 mg/dL 141  150  182  163  112   Hemoglobin A1c 4.8 - 5.6 % - - - 5.5  -     Pulmonary Assessment Scores:  Pulmonary Assessment Scores     Row Name 08/06/22 1034         ADL UCSD   ADL Phase Entry     SOB Score total 39     Rest 0     Walk 3     Stairs 5     Bath 1     Dress 1     Shop 1       CAT Score   CAT Score 8       mMRC Score   mMRC Score 1              UCSD: Self-administered rating of dyspnea associated with activities of daily living (ADLs) 6-point scale (0 = "not at all" to 5 = "maximal or unable to do because of breathlessness")  Scoring Scores range from 0 to 120.  Minimally important difference is 5 units  CAT: CAT can identify the health impairment of COPD patients and is better correlated with disease progression.  CAT has a scoring range of zero to 40. The CAT score is classified into four groups of low (less than 10), medium (10 - 20), high (21-30) and very high (31-40) based on the impact level of disease on health status. A CAT score over 10 suggests significant symptoms.  A worsening CAT score could be explained by an exacerbation, poor medication adherence, poor inhaler technique, or progression of COPD or comorbid conditions.  CAT MCID is 2 points  mMRC: mMRC (Modified Medical Research Council) Dyspnea Scale is used to assess the degree of baseline functional disability in patients of respiratory disease due to dyspnea. No minimal important difference is established. A decrease in score of 1 point  or greater is considered a positive change.   Pulmonary Function Assessment:   Exercise Target Goals: Exercise Program Goal: Individual exercise prescription set using results from initial 6 min walk test and THRR while considering  patient's activity  barriers and safety.   Exercise Prescription Goal: Initial exercise prescription builds to 30-45 minutes a day of aerobic activity, 2-3 days per week.  Home exercise guidelines will be given to patient during program as part of exercise prescription that the participant will acknowledge.  Education: Aerobic Exercise: - Group verbal and visual presentation on the components of exercise prescription. Introduces F.I.T.T principle from ACSM for exercise prescriptions.  Reviews F.I.T.T. principles of aerobic exercise including progression. Written material given at graduation. Flowsheet Row Pulmonary Rehab from 08/06/2022 in [facility] Cardiac and Pulmonary Rehab  Education need identified 08/06/22       Education: Resistance Exercise: - Group verbal and visual presentation on the components of exercise prescription. Introduces F.I.T.T principle from ACSM for exercise prescriptions  Reviews F.I.T.T. principles of resistance exercise including progression. Written material given at graduation.    Education: Exercise & Equipment Safety: - Individual verbal instruction and demonstration of equipment use and safety with use of the equipment. Flowsheet Row Pulmonary Rehab from 08/06/2022 in [facility] Cardiac and Pulmonary Rehab  Date 08/06/22  Educator NT  Instruction Review Code 1- Verbalizes Understanding       Education: Exercise Physiology & General Exercise Guidelines: - Group verbal and written instruction with models to review the exercise physiology of the cardiovascular system and associated critical values. Provides general exercise guidelines with specific guidelines to those with heart or lung disease.    Education: Flexibility, Balance, Mind/Body Relaxation: - Group verbal and visual presentation with interactive activity on the components of exercise prescription. Introduces F.I.T.T principle from ACSM for exercise prescriptions. Reviews F.I.T.T. principles of flexibility and balance exercise  training including progression. Also discusses the mind body connection.  Reviews various relaxation techniques to help reduce and manage stress (i.e. Deep breathing, progressive muscle relaxation, and visualization). Balance handout provided to take home. Written material given at graduation.   Activity Barriers & Risk Stratification:  Activity Barriers & Cardiac Risk Stratification - 08/06/22 1050       Activity Barriers & Cardiac Risk Stratification   Activity Barriers Back Problems;Shortness of Breath;Joint Problems             6 Minute Walk:  6 Minute Walk     Row Name 08/06/22 1043         6 Minute Walk   Phase Initial     Distance 1280 feet     Walk Time 6 minutes     # of Rest Breaks 0     MPH 2.42     METS 2.86     RPE 11     Perceived Dyspnea  1     VO2 Peak 10.01     Symptoms Yes (comment)     Comments Chronic Back Pain 9/10     Resting HR 68 bpm     Resting BP 126/74     Resting Oxygen Saturation  98 %     Exercise Oxygen Saturation  during 6 min walk 93 %     Max Ex. HR 105 bpm     Max Ex. BP 160/80     2 Minute Post BP 144/78       Interval HR   1 Minute HR 88     2 Minute HR 97  3 Minute HR 97     4 Minute HR 102     5 Minute HR 104     6 Minute HR 105     2 Minute Post HR 70     Interval Heart Rate? Yes       Interval Oxygen   Interval Oxygen? Yes     Baseline Oxygen Saturation % 98 %     1 Minute Oxygen Saturation % 93 %     1 Minute Liters of Oxygen 0 L     2 Minute Oxygen Saturation % 94 %     2 Minute Liters of Oxygen 0 L     3 Minute Oxygen Saturation % 94 %     3 Minute Liters of Oxygen 0 L     4 Minute Oxygen Saturation % 94 %     4 Minute Liters of Oxygen 0 L     5 Minute Oxygen Saturation % 93 %     5 Minute Liters of Oxygen 0 L     6 Minute Oxygen Saturation % 95 %     6 Minute Liters of Oxygen 0 L     2 Minute Post Oxygen Saturation % 96 %     2 Minute Post Liters of Oxygen 0 L             Oxygen Initial  Assessment:  Oxygen Initial Assessment - 07/26/22 1333       Home Oxygen   Home Oxygen Device None    Sleep Oxygen Prescription None    Home Exercise Oxygen Prescription None    Home Resting Oxygen Prescription None    Compliance with Home Oxygen Use Yes      Initial 6 min Walk   Oxygen Used None      Program Oxygen Prescription   Program Oxygen Prescription None      Intervention   Short Term Goals To learn and understand importance of monitoring SPO2 with pulse oximeter and demonstrate accurate use of the pulse oximeter.;To learn and understand importance of maintaining oxygen saturations>88%;To learn and demonstrate proper pursed lip breathing techniques or other breathing techniques. ;To learn and demonstrate proper use of respiratory medications    Long  Term Goals Verbalizes importance of monitoring SPO2 with pulse oximeter and return demonstration;Maintenance of O2 saturations>88%;Exhibits proper breathing techniques, such as pursed lip breathing or other method taught during program session;Compliance with respiratory medication;Demonstrates proper use of MDI's             Oxygen Re-Evaluation:   Oxygen Discharge (Final Oxygen Re-Evaluation):   Initial Exercise Prescription:  Initial Exercise Prescription - 08/06/22 1100       Date of Initial Exercise RX and Referring Provider   Date 08/06/22    Referring Provider Dr. Vernard Gambles, MD      Oxygen   Maintain Oxygen Saturation 88% or higher      Treadmill   MPH 2.4    Grade 0    Minutes 15    METs 2.84      Recumbant Bike   Level 2    RPM 50    Watts 26    Minutes 15    METs 2.86      NuStep   Level 2    SPM 80    Minutes 15    METs 2.86      Biostep-RELP   Level 2    SPM 50    Minutes 15  METs 2.86      Prescription Details   Frequency (times per week) 2    Duration Progress to 30 minutes of continuous aerobic without signs/symptoms of physical distress      Intensity   THRR 40-80%  of Max Heartrate 102-136    Ratings of Perceived Exertion 11-13    Perceived Dyspnea 0-4      Progression   Progression Continue to progress workloads to maintain intensity without signs/symptoms of physical distress.      Resistance Training   Training Prescription Yes    Weight 4 lb    Reps 10-15             Perform Capillary Blood Glucose checks as needed.  Exercise Prescription Changes:   Exercise Prescription Changes     Row Name 08/06/22 1100             Response to Exercise   Blood Pressure (Admit) 126/74       Blood Pressure (Exercise) 160/80       Blood Pressure (Exit) 144/78       Heart Rate (Admit) 68 bpm       Heart Rate (Exercise) 105 bpm       Heart Rate (Exit) 70 bpm       Oxygen Saturation (Admit) 98 %       Oxygen Saturation (Exercise) 93 %       Oxygen Saturation (Exit) 96 %       Rating of Perceived Exertion (Exercise) 11       Perceived Dyspnea (Exercise) 1       Symptoms Chronic Back Pain 9/10       Comments 6MWT Results                Exercise Comments:   Exercise Goals and Review:   Exercise Goals     Row Name 08/06/22 1050             Exercise Goals   Increase Physical Activity Yes       Intervention Provide advice, education, support and counseling about physical activity/exercise needs.;Develop an individualized exercise prescription for aerobic and resistive training based on initial evaluation findings, risk stratification, comorbidities and participant's personal goals.       Expected Outcomes Short Term: Attend rehab on a regular basis to increase amount of physical activity.;Long Term: Add in home exercise to make exercise part of routine and to increase amount of physical activity.;Long Term: Exercising regularly at least 3-5 days a week.       Increase Strength and Stamina Yes       Intervention Provide advice, education, support and counseling about physical activity/exercise needs.;Develop an individualized  exercise prescription for aerobic and resistive training based on initial evaluation findings, risk stratification, comorbidities and participant's personal goals.       Expected Outcomes Short Term: Increase workloads from initial exercise prescription for resistance, speed, and METs.;Short Term: Perform resistance training exercises routinely during rehab and add in resistance training at home;Long Term: Improve cardiorespiratory fitness, muscular endurance and strength as measured by increased METs and functional capacity (6MWT)       Able to understand and use rate of perceived exertion (RPE) scale Yes       Intervention Provide education and explanation on how to use RPE scale       Expected Outcomes Short Term: Able to use RPE daily in rehab to express subjective intensity level;Long Term:  Able to use RPE to guide  intensity level when exercising independently       Able to understand and use Dyspnea scale Yes       Intervention Provide education and explanation on how to use Dyspnea scale       Expected Outcomes Short Term: Able to use Dyspnea scale daily in rehab to express subjective sense of shortness of breath during exertion;Long Term: Able to use Dyspnea scale to guide intensity level when exercising independently       Knowledge and understanding of Target Heart Rate Range (THRR) Yes       Intervention Provide education and explanation of THRR including how the numbers were predicted and where they are located for reference       Expected Outcomes Short Term: Able to state/look up THRR;Long Term: Able to use THRR to govern intensity when exercising independently;Short Term: Able to use daily as guideline for intensity in rehab       Able to check pulse independently Yes       Intervention Provide education and demonstration on how to check pulse in carotid and radial arteries.;Review the importance of being able to check your own pulse for safety during independent exercise       Expected  Outcomes Short Term: Able to explain why pulse checking is important during independent exercise;Long Term: Able to check pulse independently and accurately       Understanding of Exercise Prescription Yes       Intervention Provide education, explanation, and written materials on patient's individual exercise prescription       Expected Outcomes Short Term: Able to explain program exercise prescription;Long Term: Able to explain home exercise prescription to exercise independently                Exercise Goals Re-Evaluation :   Discharge Exercise Prescription (Final Exercise Prescription Changes):  Exercise Prescription Changes - 08/06/22 1100       Response to Exercise   Blood Pressure (Admit) 126/74    Blood Pressure (Exercise) 160/80    Blood Pressure (Exit) 144/78    Heart Rate (Admit) 68 bpm    Heart Rate (Exercise) 105 bpm    Heart Rate (Exit) 70 bpm    Oxygen Saturation (Admit) 98 %    Oxygen Saturation (Exercise) 93 %    Oxygen Saturation (Exit) 96 %    Rating of Perceived Exertion (Exercise) 11    Perceived Dyspnea (Exercise) 1    Symptoms Chronic Back Pain 9/10    Comments 6MWT Results             Nutrition:  Target Goals: Understanding of nutrition guidelines, daily intake of sodium 1500mg , cholesterol 200mg , calories 30% from fat and 7% or less from saturated fats, daily to have 5 or more servings of fruits and vegetables.  Education: All About Nutrition: -Group instruction provided by verbal, written material, interactive activities, discussions, models, and posters to present general guidelines for heart healthy nutrition including fat, fiber, MyPlate, the role of sodium in heart healthy nutrition, utilization of the nutrition label, and utilization of this knowledge for meal planning. Follow up email sent as well. Written material given at graduation.   Biometrics:  Pre Biometrics - 08/06/22 1051       Pre Biometrics   Height 5\' 5"  (1.651 m)     Weight 215 lb 9.6 oz (97.8 kg)    Waist Circumference 41.5 inches    Hip Circumference 43 inches    Waist to Hip Ratio 0.97 %  BMI (Calculated) 35.88    Single Leg Stand 30 seconds              Nutrition Therapy Plan and Nutrition Goals:  Nutrition Therapy & Goals - 08/06/22 1030       Intervention Plan   Intervention Prescribe, educate and counsel regarding individualized specific dietary modifications aiming towards targeted core components such as weight, hypertension, lipid management, diabetes, heart failure and other comorbidities.    Expected Outcomes Short Term Goal: A plan has been developed with personal nutrition goals set during dietitian appointment.;Short Term Goal: Understand basic principles of dietary content, such as calories, fat, sodium, cholesterol and nutrients.;Long Term Goal: Adherence to prescribed nutrition plan.             Nutrition Assessments:  MEDIFICTS Score Key: ?70 Need to make dietary changes  40-70 Heart Healthy Diet ? 40 Therapeutic Level Cholesterol Diet  Flowsheet Row Pulmonary Rehab from 08/06/2022 in [facility] Cardiac and Pulmonary Rehab  Picture Your Plate Total Score on Admission 72      Picture Your Plate Scores: [id number] Unhealthy dietary pattern with much room for improvement. 41-50 Dietary pattern unlikely to meet recommendations for good health and room for improvement. 51-60 More healthful dietary pattern, with some room for improvement.  >60 Healthy dietary pattern, although there may be some specific behaviors that could be improved.   Nutrition Goals Re-Evaluation:   Nutrition Goals Discharge (Final Nutrition Goals Re-Evaluation):   Psychosocial: Target Goals: Acknowledge presence or absence of significant depression and/or stress, maximize coping skills, provide positive support system. Participant is able to verbalize types and ability to use techniques and skills needed for reducing stress and depression.   Education:  Stress, Anxiety, and Depression - Group verbal and visual presentation to define topics covered.  Reviews how body is impacted by stress, anxiety, and depression.  Also discusses healthy ways to reduce stress and to treat/manage anxiety and depression.  Written material given at graduation.   Education: Sleep Hygiene -Provides group verbal and written instruction about how sleep can affect your health.  Define sleep hygiene, discuss sleep cycles and impact of sleep habits. Review good sleep hygiene tips.    Initial Review & Psychosocial Screening:  Initial Psych Review & Screening - 07/26/22 1342       Initial Review   Current issues with Current Stress Concerns    Source of Stress Concerns Financial;Chronic Illness      Family Dynamics   Good Support System? Yes   daughters     Barriers   Psychosocial barriers to participate in program There are no identifiable barriers or psychosocial needs.;The patient should benefit from training in stress management and relaxation.      Screening Interventions   Interventions Encouraged to exercise;Provide feedback about the scores to participant    Expected Outcomes Short Term goal: Utilizing psychosocial counselor, staff and physician to assist with identification of specific Stressors or current issues interfering with healing process. Setting desired goal for each stressor or current issue identified.;Long Term Goal: Stressors or current issues are controlled or eliminated.;Short Term goal: Identification and review with participant of any Quality of Life or Depression concerns found by scoring the questionnaire.;Long Term goal: The participant improves quality of Life and PHQ9 Scores as seen by post scores and/or verbalization of changes             Quality of Life Scores:  Scores of 19 and below usually indicate a poorer quality of life in these areas.  A difference of  2-3 points is a clinically meaningful difference.  A difference of  2-3 points in the total score of the Quality of Life Index has been associated with significant improvement in overall quality of life, self-image, physical symptoms, and general health in studies assessing change in quality of life.  PHQ-9: Review Flowsheet  More data exists      08/06/2022 03/26/2022 02/01/2022 10/18/2021 08/10/2021  Depression screen PHQ 2/9  Decreased Interest 0 0 0 0 0  Down, Depressed, Hopeless 0 0 0 0 0  PHQ - 2 Score 0 0 0 0 0  Altered sleeping 1 1 3  - 1  Tired, decreased energy 1 1 0 - 1  Change in appetite 0 0 0 - 0  Feeling bad or failure about yourself  0 0 0 - 0  Trouble concentrating 0 0 0 - 0  Moving slowly or fidgety/restless 0 0 0 - 0  Suicidal thoughts 0 0 0 - 0  PHQ-9 Score 2 2 3  - 2  Difficult doing work/chores Not difficult at all - Not difficult at all - Not difficult at all   Interpretation of Total Score  Total Score Depression Severity:  1-4 = Minimal depression, 5-9 = Mild depression, 10-14 = Moderate depression, 15-19 = Moderately severe depression, 20-27 = Severe depression   Psychosocial Evaluation and Intervention:  Psychosocial Evaluation - 07/26/22 1401       Psychosocial Evaluation & Interventions   Interventions Encouraged to exercise with the program and follow exercise prescription;Stress management education;Relaxation education    Comments Ms. Livingston is coming to Pulmonary Rehab with dypsnea and mild COPD. She has noticed that her breathing and stamina have gotten a lot worse in the last year, where it is hard for her to walk even short distances where before she could walk with no problem. She had her thyroid surgery years ago and states she has gained a lot of weight after that she can't seem to shake. She is working really hard on quitting smoking entirely. She tried the patches and had to stop because of their side effects. She has started changing her lifestyle habits, like drinking more water or going outside on her porch instead  of smoking. She is down to very sporadically smoking. She mentioned that some triggers are her car (since is smells like smoke), when she is on the phone a lot during her work calls, and when her what used to be very big temper flares up. As far as her temper, she notes that she has made a lot of positive changes over the years and she doesn't let as many things bother her as she used to. She really likes her job at [employer] where she works from home most days. However, she notes that she sits a a lot more than she used to. She is looking forward to becoming more active, but hesitant on what she can actually do given her worsening health concerns.    Expected Outcomes Short: attend pulmonary rehab for education and exercise. Long: develop and maintain positive self care habits.    Continue Psychosocial Services  Follow up required by staff             Psychosocial Re-Evaluation:   Psychosocial Discharge (Final Psychosocial Re-Evaluation):   Education: Education Goals: Education classes will be provided on a weekly basis, covering required topics. Participant will state understanding/return demonstration of topics presented.  Learning Barriers/Preferences:  Learning Barriers/Preferences - 07/26/22 1342  Learning Barriers/Preferences   Learning Barriers None    Learning Preferences None             General Pulmonary Education Topics:  Infection Prevention: - Provides verbal and written material to individual with discussion of infection control including proper hand washing and proper equipment cleaning during exercise session. Flowsheet Row Pulmonary Rehab from 08/06/2022 in [facility] Cardiac and Pulmonary Rehab  Date 08/06/22  Educator NT  Instruction Review Code 1- Verbalizes Understanding       Falls Prevention: - Provides verbal and written material to individual with discussion of falls prevention and safety. Flowsheet Row Pulmonary Rehab from 08/06/2022 in [facility]  Cardiac and Pulmonary Rehab  Date 08/06/22  Educator NT  Instruction Review Code 1- Verbalizes Understanding       Chronic Lung Disease Review: - Group verbal instruction with posters, models, PowerPoint presentations and videos,  to review new updates, new respiratory medications, new advancements in procedures and treatments. Providing information on websites and "800" numbers for continued self-education. Includes information about supplement oxygen, available portable oxygen systems, continuous and intermittent flow rates, oxygen safety, concentrators, and Medicare reimbursement for oxygen. Explanation of Pulmonary Drugs, including class, frequency, complications, importance of spacers, rinsing mouth after steroid MDI's, and proper cleaning methods for nebulizers. Review of basic lung anatomy and physiology related to function, structure, and complications of lung disease. Review of risk factors. Discussion about methods for diagnosing sleep apnea and types of masks and machines for OSA. Includes a review of the use of types of environmental controls: home humidity, furnaces, filters, dust mite/pet prevention, HEPA vacuums. Discussion about weather changes, air quality and the benefits of nasal washing. Instruction on Warning signs, infection symptoms, calling MD promptly, preventive modes, and value of vaccinations. Review of effective airway clearance, coughing and/or vibration techniques. Emphasizing that all should Create an Action Plan. Written material given at graduation.   AED/CPR: - Group verbal and written instruction with the use of models to demonstrate the basic use of the AED with the basic ABC's of resuscitation.    Anatomy and Cardiac Procedures: - Group verbal and visual presentation and models provide information about basic cardiac anatomy and function. Reviews the testing methods done to diagnose heart disease and the outcomes of the test results. Describes the treatment  choices: Medical Management, Angioplasty, or Coronary Bypass Surgery for treating various heart conditions including Myocardial Infarction, Angina, Valve Disease, and Cardiac Arrhythmias.  Written material given at graduation.   Medication Safety: - Group verbal and visual instruction to review commonly prescribed medications for heart and lung disease. Reviews the medication, class of the drug, and side effects. Includes the steps to properly store meds and maintain the prescription regimen.  Written material given at graduation.   Other: -Provides group and verbal instruction on various topics (see comments)   Knowledge Questionnaire Score:  Knowledge Questionnaire Score - 08/06/22 1031       Knowledge Questionnaire Score   Pre Score 14/18              Core Components/Risk Factors/Patient Goals at Admission:  Personal Goals and Risk Factors at Admission - 08/06/22 1035       Core Components/Risk Factors/Patient Goals on Admission    Weight Management Yes;Weight Gain    Intervention Weight Management: Develop a combined nutrition and exercise program designed to reach desired caloric intake, while maintaining appropriate intake of nutrient and fiber, sodium and fats, and appropriate energy expenditure required for the weight goal.;Weight Management: Provide education  and appropriate resources to help participant work on and attain dietary goals.;Weight Management/Obesity: Establish reasonable short term and long term weight goals.    Admit Weight 215 lb 9.6 oz (97.8 kg)    Goal Weight: Short Term 205 lb (93 kg)    Goal Weight: Long Term 175 lb (79.4 kg)    Expected Outcomes Short Term: Continue to assess and modify interventions until short term weight is achieved;Long Term: Adherence to nutrition and physical activity/exercise program aimed toward attainment of established weight goal;Weight Loss: Understanding of general recommendations for a balanced deficit meal plan, which  promotes 1-2 lb weight loss per week and includes a negative energy balance of [phone] kcal/d;Understanding recommendations for meals to include 15-35% energy as protein, 25-35% energy from fat, 35-60% energy from carbohydrates, less than 200mg  of dietary cholesterol, 20-35 gm of total fiber daily;Understanding of distribution of calorie intake throughout the day with the consumption of 4-5 meals/snacks    Tobacco Cessation Yes    Number of packs per day --   inconsistent   Intervention Assist the participant in steps to quit. Provide individualized education and counseling about committing to Tobacco Cessation, relapse prevention, and pharmacological support that can be provided by physician.;Advice worker, assist with locating and accessing local/national Quit Smoking programs, and support quit date choice.    Expected Outcomes Short Term: Will demonstrate readiness to quit, by selecting a quit date.;Short Term: Will quit all tobacco product use, adhering to prevention of relapse plan.;Long Term: Complete abstinence from all tobacco products for at least 12 months from quit date.    Improve shortness of breath with ADL's Yes    Intervention Provide education, individualized exercise plan and daily activity instruction to help decrease symptoms of SOB with activities of daily living.    Expected Outcomes Short Term: Improve cardiorespiratory fitness to achieve a reduction of symptoms when performing ADLs;Long Term: Be able to perform more ADLs without symptoms or delay the onset of symptoms    Hypertension Yes    Intervention Provide education on lifestyle modifcations including regular physical activity/exercise, weight management, moderate sodium restriction and increased consumption of fresh fruit, vegetables, and low fat dairy, alcohol moderation, and smoking cessation.;Monitor prescription use compliance.    Expected Outcomes Short Term: Continued assessment and intervention until BP  is < 140/61mm HG in hypertensive participants. < 130/71mm HG in hypertensive participants with diabetes, heart failure or chronic kidney disease.;Long Term: Maintenance of blood pressure at goal levels.    Lipids Yes    Intervention Provide education and support for participant on nutrition & aerobic/resistive exercise along with prescribed medications to achieve LDL 70mg , HDL >40mg .    Expected Outcomes Short Term: Participant states understanding of desired cholesterol values and is compliant with medications prescribed. Participant is following exercise prescription and nutrition guidelines.;Long Term: Cholesterol controlled with medications as prescribed, with individualized exercise RX and with personalized nutrition plan. Value goals: LDL < 70mg , HDL > 40 mg.             Education:Diabetes - Individual verbal and written instruction to review signs/symptoms of diabetes, desired ranges of glucose level fasting, after meals and with exercise. Acknowledge that pre and post exercise glucose checks will be done for 3 sessions at entry of program.   Know Your Numbers and Heart Failure: - Group verbal and visual instruction to discuss disease risk factors for cardiac and pulmonary disease and treatment options.  Reviews associated critical values for Overweight/Obesity, Hypertension, Cholesterol, and Diabetes.  Discusses  basics of heart failure: signs/symptoms and treatments.  Introduces Heart Failure Zone chart for action plan for heart failure.  Written material given at graduation. Flowsheet Row Pulmonary Rehab from 08/06/2022 in [facility] Cardiac and Pulmonary Rehab  Education need identified 08/06/22       Core Components/Risk Factors/Patient Goals Review:    Core Components/Risk Factors/Patient Goals at Discharge (Final Review):    ITP Comments:  ITP Comments     Row Name 07/26/22 1350 08/06/22 1028         ITP Comments Initial phone call completed. Diagnosis can be found in [facility]  12/1. EP Orientation scheduled for Monday 4/1 at [place] is a current tobacco user. Intervention for tobacco cessation was provided at the initial medical review. She was asked about readiness to quit and reported she is ready to quit . Patient was advised and educated about tobacco cessation using combination therapy, tobacco cessation classes, quit line, and quit smoking apps. Patient demonstrated understanding of this material. Staff will continue to provide encouragement and follow up with the patient throughout the program. Completed 6MWT and gym orientation. Initial ITP created and sent for review to Dr. Ottie Glazier, Medical Director.               Comments: Initial ITP

## 2022-08-06 NOTE — Progress Notes (Signed)
Subjective:    [name] ID: [name], female    DOB: 1955/12/26, 67 y.o.   MRN: [id number] [name] Care Team: Valerie Roys, DO as PCP - General (Family Medicine) Rebekah Chesterfield, LCSW as Social Worker (Licensed Clinical Social Worker)  Chief Complaint  [name] presents with   Follow-up    SOB with exertion. No wheezing or cough. CPAP is doing great!     HPI [name] is a 67 year old current smoker with a 40-pack-year history of smoking and a history as noted below who follows for the issue of shortness of breath.  [name] was initially evaluated here on 06 April 2022.  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  Initial impressions were that her dyspnea was likely related to obesity/deconditioning.  [name] also underwent a home sleep study due to prior diagnosis of sleep apnea that was untreated.  [name] had a home sleep study performed on 26 January that shows that [name] has severe sleep apnea with an AHI of 40.  [name] has been using CPAP and notes that this is working well for her.  [name] states "I love my CPAP".  [name] has started cardiopulmonary rehab.  [name] has noted that [name] has to use albuterol at least twice a week and that this is helpful when [name] gets short of breath.  [name] is currently not on maintenance inhalers.  [name] follows with cardiology for atrial fibrillation which is paroxysmal in nature.  [name] is on Rythmol which is also associated with dyspnea.  Since her prior visit [name] has not had any issues with fevers, chills or sweats.  No cough or sputum production.  No wheezing.  Only dyspnea on exertion and by her own admission, getting better.  [name] has not had lower extremity edema, no calf tenderness.  CPAP compliance report shows that the [name] has been compliant and usage for 87% of the 30 days time.  [name] did have some breaks in therapy due to recent move from her current apartment.  Residual AHI is 3.9 (recall baseline AHI was 40).   DATA 12/26/2021 PFTs: FEV1 2.41  L or 95% predicted, FVC 3.27 L or 99% predicted FEV1/FVC 74%, lung volumes low normal.  Postbronchodilator study not done.  Diffusion capacity mildly reduced.  There is curvature of the flow-volume loop consistent with mild obstruction. 12/26/2021 echocardiogram: LVEF 60 to 65%, no regional wall motion abnormalities, grade 1 DD, RV function normal, LA size moderately dilated, mild mitral regurg.  No mitral stenosis.  No aortic valve abnormalities. 06/01/2022 [id number] obstructive sleep apnea with AHI of 40 and SpO2 low of 79%. 08/02/2022 LDCT chest: Centrilobular and paraseptal emphysema evident.  Tiny bilateral pulmonary nodules maximum size of 4.1 mm no suspicious pulmonary nodule or mass.  No focal airspace consolidation.  Lung RADS 2, benign appearance and behavior, continue yearly monitoring.  Review of Systems A 10 point review of systems was performed and it is as noted above otherwise negative.  [name] Active Problem List   Diagnosis Date Noted   Homelessness 09/22/2021   Advance directive discussed with [name] 01/30/2021   Scoliosis 08/02/2015   Chronic fatigue 02/06/2015   CKD (chronic kidney disease) stage 3, GFR 30-59 ml/min 02/06/2015   Nasal polyp, posterior 02/03/2015   Muscle spasms of head and/or neck 11/11/2014   Neck pain of over 3 months duration 10/21/2014   GERD (gastroesophageal reflux disease)    Hypothyroidism    Migraines    Depression, major, recurrent, moderate 07/20/2014  Class: Chronic   OSA (obstructive sleep apnea) 02/15/2014   Atrial fibrillation 08/29/2012   Dyspnea on exertion 08/29/2012   DEGENERATIVE JOINT DISEASE 02/25/2008   Hyperlipidemia 01/29/2007   COPD (chronic obstructive pulmonary disease) ([place]) 01/29/2007   Anxiety state 01/28/2007   Tobacco abuse 01/28/2007   OVARIAN CYST, LEFT 05/14/2006   Social History   Tobacco Use   Smoking status: Every Day    Packs/day: 1.50    Years: 39.00    Additional pack years: 0.00    Total pack  years: 58.50    Types: Cigarettes   Smokeless tobacco: Never   Tobacco comments:    2 cigarettes a day- 08/06/2022 khj  Substance Use Topics   Alcohol use: Yes    Alcohol/week: 0.0 standard drinks of alcohol    Comment: Rare   Allergies  Allergen Reactions   Bee Venom Anaphylaxis   Penicillins Rash   Sulfonamide Derivatives Rash   Cardizem  [Diltiazem Hcl] Other (See Comments)    Other Reaction: severe hypotension w/ IV dose   Sulfa Antibiotics Hives   Tape Rash    Uncoded Allergy. Allergen: tape & bandaids   Current Meds  Medication Sig   albuterol (VENTOLIN HFA) 108 (90 Base) MCG/ACT inhaler Inhale 2 puffs into the lungs every 6 (six) hours as needed for wheezing or shortness of breath.   atorvastatin (LIPITOR) 40 MG tablet Take 1 tablet (40 mg total) by mouth daily.   citalopram (CELEXA) 40 MG tablet Take 1.5 tablets (60 mg total) by mouth daily.   furosemide (LASIX) 20 MG tablet Take 1 tablet (20 mg total) by mouth 3 (three) times a week.   levothyroxine (SYNTHROID) 125 MCG tablet TAKE ONE TABLET BY MOUTH DAILY WITH BREAKFAST   pantoprazole (PROTONIX) 20 MG tablet Take 1 tablet (20 mg total) by mouth daily.   propafenone (RYTHMOL) 225 MG tablet TAKE 1 TABLET BY MOUTH DAILY AS NEEDED FOR AFIB   Spacer/Aero-Holding Chambers DEVI 1 Device by Does not apply route as needed.   Tiotropium Bromide-Olodaterol (STIOLTO RESPIMAT) 2.5-2.5 MCG/ACT AERS Inhale 2 puffs into the lungs daily.   Immunization History  Administered Date(s) Administered   [employer] Vaccination 05/10/2020   PFIZER(Purple Top)SARS-COV-2 Vaccination 08/18/2019, 09/05/2019   Pneumococcal Conjugate-13 01/30/2021   Td 05/08/1995, 12/14/2008   Tdap 11/20/2019       Objective:   Physical Exam BP 124/82 (BP Location: Left Arm, Cuff Size: Large)   Pulse 63   Temp 97.7 F (36.5 C)   Ht 5\' 5"  (1.651 m)   Wt 215 lb 9.6 oz (97.8 kg)   SpO2 97%   BMI 35.88 kg/m   SpO2: 97 % O2 Device: None (Room  air)  GENERAL: Obese woman, no acute distress, fully ambulatory, no conversational dyspnea. HEAD: Normocephalic, atraumatic.  EYES: Pupils equal, round, reactive to light.  No scleral icterus.  MOUTH: Poor dentition, missing teeth, chipped, Mallampati class III. NECK: Supple. No thyromegaly. Trachea midline. No JVD.  No adenopathy. PULMONARY: Good air entry bilaterally.  Very mild end expiratory wheezes on the upper lung zones otherwise clear. CARDIOVASCULAR: S1 and S2. Regular rate and rhythm.  No rubs, murmurs or gallops heard. ABDOMEN: Obese otherwise, benign. MUSCULOSKELETAL: Significant kyphosis noted no joint deformity, no clubbing, no edema.  NEUROLOGIC: No overt focal deficit, no gait disturbance, speech is fluent. SKIN: Intact,warm,dry. PSYCH: Mood and behavior normal.     Assessment & Plan:     ICD-10-CM   1. Stage 1 mild COPD by GOLD  classification  J44.9    Trial of Stiolto 2 puffs daily Continue as needed albuterol Continue efforts to quit smoking    2. SOB (shortness of breath)  R06.02    Enrolled in pulmonary rehab    3. OSA (obstructive sleep apnea) - AHI 40, severe  G47.33    Continue CPAP [name] doing well with residual AHI 3.9 Few gaps due to recent move from her apartment Continue auto CPAP 5 to 20 cm H2O    4. Obesity (BMI 35.0-39.9 without comorbidity)  E66.9    [name] would benefit from weight loss May be adding to her sensation of dyspnea    5. Moderate smoker (20 or less per day)  F17.210    Continue efforts to quit smoking     Meds ordered this encounter  Medications   Tiotropium Bromide-Olodaterol (STIOLTO RESPIMAT) 2.5-2.5 MCG/ACT AERS    Sig: Inhale 2 puffs into the lungs daily.    Dispense:  4 g    Refill:  0    Order Specific Question:   Lot Number?    Answer:   [id number]    Order Specific Question:   Expiration Date?    Answer:   09/04/2024    Order Specific Question:   Quantity    Answer:   2   [name] is doing well.  [name] appears to  be adapting to CPAP well.  [name] had some breaks in therapy due to recent move from her apartment but overall very compliant.  [name] has started cardiopulmonary rehab.  Hopefully this will help her with conditioning.  I have provided her with samples of Stiolto, [name] is to let us know how this works for her.  We will see her in follow-up in 4 months time [name] is to contact us prior to that time should any new difficulties arise.   Renold Don, MD Advanced Bronchoscopy [facility]    *This note was dictated using voice recognition software/Dragon.  Despite best efforts to proofread, errors can occur which can change the meaning. Any transcriptional errors that result from this process are unintentional and may not be fully corrected at the time of dictation.

## 2022-08-06 NOTE — Patient Instructions (Signed)
We are giving you a trial of an inhaler called Stiolto this is 2 puffs daily.  Continue using your CPAP, you are doing well with it.  Continue your rehab.  I think this will be very helpful for you.  We will see you in follow-up in 4 months time call sooner should any new problems arise.

## 2022-08-08 ENCOUNTER — Telehealth: Payer: Self-pay | Admitting: Pulmonary Disease

## 2022-08-08 NOTE — Telephone Encounter (Signed)
Patient needs note stating she is doing pulmonary rehab. Patient phone number is [phone].

## 2022-08-08 NOTE — Telephone Encounter (Signed)
I spoke with the patient. She said she needs a note for work saying she is starting Pulmonary Rehab on 4/8 and will be doing it on Monday, Wednesday and Thursday until 12/06/2022.   I have placed the note in the patient's chart for her to view through her MyChart. Nothing further needed.

## 2022-08-13 ENCOUNTER — Encounter: Payer: BC Managed Care – PPO | Admitting: *Deleted

## 2022-08-13 DIAGNOSIS — J449 Chronic obstructive pulmonary disease, unspecified: Secondary | ICD-10-CM | POA: Diagnosis not present

## 2022-08-13 NOTE — Progress Notes (Signed)
Daily Session Note  Patient Details  Name: [name] MRN: [id number] Date of Birth: May 15, 1955 Referring Provider:   Flowsheet Row Pulmonary Rehab from 08/06/2022 in [facility] Cardiac and Pulmonary Rehab  Referring Provider Dr. Sarina Ser, MD       Encounter Date: 08/13/2022  Check In:  Session Check In - 08/13/22 1720       Check-In   Supervising physician immediately available to respond to emergencies See telemetry face sheet for immediately available ER MD    Location [name]    Staff Present [name], RN BSN;Joseph [place], RCP,RRT,BSRT;Noah [place], [place], Exercise Physiologist    Virtual Visit No    Medication changes reported     No    Fall or balance concerns reported    No    Tobacco Cessation No Change    Current number of cigarettes/nicotine per day     2    Warm-up and Cool-down Performed on first and last piece of equipment    Resistance Training Performed Yes    VAD Patient? No    PAD/SET Patient? No      Pain Assessment   Currently in Pain? No/denies                Social History   Tobacco Use  Smoking Status Every Day   Packs/day: 1.50   Years: 39.00   Additional pack years: 0.00   Total pack years: 58.50   Types: Cigarettes  Smokeless Tobacco Never  Tobacco Comments   2 cigarettes a day- 08/06/2022 khj    Goals Met:  Independence with exercise equipment Exercise tolerated well No report of concerns or symptoms today Strength training completed today  Goals Unmet:  Not Applicable  Comments: First full day of exercise!  Patient was oriented to gym and equipment including functions, settings, policies, and procedures.  Patient's individual exercise prescription and treatment plan were reviewed.  All starting workloads were established based on the results of the 6 minute walk test done at initial orientation visit.  The plan for exercise progression was also introduced and progression will be customized based on  patient's performance and goals.    Dr. Bethann Punches is Medical Director for [facility] Cardiac Rehabilitation.  Dr. Vida Rigger is Medical Director for [facility] Pulmonary Rehabilitation.

## 2022-08-16 ENCOUNTER — Encounter: Payer: BC Managed Care – PPO | Admitting: *Deleted

## 2022-08-16 DIAGNOSIS — J449 Chronic obstructive pulmonary disease, unspecified: Secondary | ICD-10-CM

## 2022-08-16 NOTE — Progress Notes (Signed)
Daily Session Note  Patient Details  Name: [name] MRN: [id number] Date of Birth: 1956-02-07 Referring Provider:   Flowsheet Row Pulmonary Rehab from 08/06/2022 in [facility] Cardiac and Pulmonary Rehab  Referring Provider Dr. Sarina Ser, MD       Encounter Date: 08/16/2022  Check In:  Session Check In - 08/16/22 1706       Check-In   Supervising physician immediately available to respond to emergencies See telemetry face sheet for immediately available ER MD    Location [name]    Staff Present [name], RN BSN;Joseph [place], RCP,RRT,BSRT;Noah [place], [place], Exercise Physiologist    Virtual Visit No    Medication changes reported     No    Fall or balance concerns reported    No    Tobacco Cessation Use Decreased    Current number of cigarettes/nicotine per day     1    Warm-up and Cool-down Performed on first and last piece of equipment    Resistance Training Performed Yes    VAD Patient? No    PAD/SET Patient? No      Pain Assessment   Currently in Pain? No/denies                Social History   Tobacco Use  Smoking Status Every Day   Packs/day: 1.50   Years: 39.00   Additional pack years: 0.00   Total pack years: 58.50   Types: Cigarettes  Smokeless Tobacco Never  Tobacco Comments   2 cigarettes a day- 08/06/2022 khj    Goals Met:  Independence with exercise equipment Exercise tolerated well No report of concerns or symptoms today Strength training completed today  Goals Unmet:  Not Applicable  Comments: Pt able to follow exercise prescription today without complaint.  Will continue to monitor for progression.    Dr. Bethann Punches is Medical Director for [facility] Cardiac Rehabilitation.  Dr. Vida Rigger is Medical Director for [facility] Pulmonary Rehabilitation.

## 2022-08-20 ENCOUNTER — Encounter: Payer: BC Managed Care – PPO | Admitting: *Deleted

## 2022-08-20 DIAGNOSIS — J449 Chronic obstructive pulmonary disease, unspecified: Secondary | ICD-10-CM | POA: Diagnosis not present

## 2022-08-20 NOTE — Progress Notes (Signed)
Daily Session Note  Patient Details  Name: [name] MRN: [id number] Date of Birth: 08/05/1955 Referring Provider:   Flowsheet Row Pulmonary Rehab from 08/06/2022 in [facility]  Referring Provider Dr. Sarina Ser, MD       Encounter Date: 08/20/2022  Check In:  Session Check In - 08/20/22 1736       Check-In   Supervising physician immediately available to respond to emergencies See telemetry face sheet for immediately available ER MD    Location [name]    Staff Present Lanny Hurst, RN, ADN;Joseph Hood, RCP,RRT,BSRT;Noah Tickle, BS, Exercise Physiologist    Virtual Visit No    Medication changes reported     No    Fall or balance concerns reported    No    Tobacco Cessation No Change    Warm-up and Cool-down Performed on first and last piece of equipment    Resistance Training Performed Yes    VAD Patient? No    PAD/SET Patient? No      Pain Assessment   Currently in Pain? No/denies                Social History   Tobacco Use  Smoking Status Every Day   Packs/day: 1.50   Years: 39.00   Additional pack years: 0.00   Total pack years: 58.50   Types: Cigarettes  Smokeless Tobacco Never  Tobacco Comments   2 cigarettes a day- 08/06/2022 khj    Goals Met:  Independence with exercise equipment Exercise tolerated well No report of concerns or symptoms today Strength training completed today  Goals Unmet:  Not Applicable  Comments: Pt able to follow exercise prescription today without complaint.  Will continue to monitor for progression.    Dr. Bethann Punches is Medical Director for [facility].  Dr. Vida Rigger is Medical Director for [facility].

## 2022-08-22 ENCOUNTER — Encounter: Payer: Self-pay | Admitting: *Deleted

## 2022-08-22 DIAGNOSIS — J449 Chronic obstructive pulmonary disease, unspecified: Secondary | ICD-10-CM

## 2022-08-22 DIAGNOSIS — R0602 Shortness of breath: Secondary | ICD-10-CM

## 2022-08-22 NOTE — Progress Notes (Signed)
Pulmonary Individual Treatment Plan  Patient Details  Name: [name] MRN: [id number] Date of Birth: 08-01-1955 Referring Provider:   Flowsheet Row Pulmonary Rehab from 08/06/2022 in [facility] Cardiac and Pulmonary Rehab  Referring Provider Dr. Sarina Ser, MD       Initial Encounter Date:  Flowsheet Row Pulmonary Rehab from 08/06/2022 in [facility] Cardiac and Pulmonary Rehab  Date 08/06/22       Visit Diagnosis: Chronic obstructive pulmonary disease, unspecified COPD type  Shortness of breath  Patient's Home Medications on Admission:  Current Outpatient Medications:    albuterol (VENTOLIN HFA) 108 (90 Base) MCG/ACT inhaler, Inhale 2 puffs into the lungs every 6 (six) hours as needed for wheezing or shortness of breath., Disp: 8 g, Rfl: 2   atorvastatin (LIPITOR) 40 MG tablet, Take 1 tablet (40 mg total) by mouth daily., Disp: 90 tablet, Rfl: 1   citalopram (CELEXA) 40 MG tablet, Take 1.5 tablets (60 mg total) by mouth daily., Disp: 135 tablet, Rfl: 1   furosemide (LASIX) 20 MG tablet, Take 1 tablet (20 mg total) by mouth 3 (three) times a week., Disp: 45 tablet, Rfl: 3   levothyroxine (SYNTHROID) 125 MCG tablet, TAKE ONE TABLET BY MOUTH DAILY WITH BREAKFAST, Disp: 90 tablet, Rfl: 3   pantoprazole (PROTONIX) 20 MG tablet, Take 1 tablet (20 mg total) by mouth daily., Disp: 90 tablet, Rfl: 3   propafenone (RYTHMOL) 225 MG tablet, TAKE 1 TABLET BY MOUTH DAILY AS NEEDED FOR AFIB, Disp: 30 tablet, Rfl: 1   Spacer/Aero-Holding Chambers DEVI, 1 Device by Does not apply route as needed., Disp: 2 each, Rfl: 0   Tiotropium Bromide-Olodaterol (STIOLTO RESPIMAT) 2.5-2.5 MCG/ACT AERS, Inhale 2 puffs into the lungs daily., Disp: 4 g, Rfl: 0 No current facility-administered medications for this visit.  Facility-Administered Medications Ordered in Other Visits:    albuterol (PROVENTIL) (2.5 MG/3ML) 0.083% nebulizer solution 2.5 mg, 2.5 mg, Nebulization, Once, Duke Salvia, MD  Past Medical  History: Past Medical History:  Diagnosis Date   Anxiety state, unspecified    Back pain    Chest pain    a. 2008 Cath: reportedly nl;  b. 09/2012 Lexi MV: EF 75%, soft tissue attenuation->Low risk.   COPD (chronic obstructive pulmonary disease)    CTS (carpal tunnel syndrome)    Depression    GERD (gastroesophageal reflux disease)    Migraines    Neck pain    Nontoxic multinodular goiter    Other and unspecified hyperlipidemia    Other and unspecified ovarian cyst    left   Other chest pain    PAF (paroxysmal atrial fibrillation)    a. 07/2007 Echo: EF 65%, mildly dil LA;  b. currently on propafenone;  c. CHA2DS2VASc = 1 (gender)-->No anticoagulation.   PTSD (post-traumatic stress disorder)    Tobacco use    Tobacco use disorder    Unspecified hypothyroidism    Unspecified sleep apnea    resolved s/p UPPP surgery   Vitamin B12 deficiency     Tobacco Use: Social History   Tobacco Use  Smoking Status Every Day   Packs/day: 1.50   Years: 39.00   Additional pack years: 0.00   Total pack years: 58.50   Types: Cigarettes  Smokeless Tobacco Never  Tobacco Comments   2 cigarettes a day- 08/06/2022 khj    Labs: Review Flowsheet  More data exists      Latest Ref Rng & Units 05/09/2008 10/09/2017 11/20/2019 01/30/2021 03/26/2022  Labs for ITP Cardiac and Pulmonary  Rehab  Cholestrol 100 - 199 mg/dL 161        ATP III CLASSIFICATION:  <200     mg/dL   Desirable  096-045  mg/dL   Borderline High  >=409    mg/dL   High         811  914  149  180   LDL (calc) 0 - 99 mg/dL 782        Total Cholesterol/HDL:CHD Risk Coronary Heart Disease Risk Table                     Men   Women  1/2 Average Risk   3.4   3.3  Average Risk       5.0   4.4  2 X Average Risk   9.6   7.1  3 X Average Risk  23.4   11.0        Use the calculated Patient Ratio above and the CHD Risk Table to determine the patient's CHD Risk.        ATP III CLASSIFICATION (LDL):  <100     mg/dL   Optimal  956-213   mg/dL   Near or Above                    Optimal  130-159  mg/dL   Borderline  086-578  mg/dL   High  >469     mg/dL   Very High  629  528  82  105   HDL-C >39 mg/dL 36  45  41  39  55   Trlycerides 0 - 149 mg/dL 413  244  010  272  536   Hemoglobin A1c 4.8 - 5.6 % - - - 5.5  -     Pulmonary Assessment Scores:  Pulmonary Assessment Scores     Row Name 08/06/22 1034         ADL UCSD   ADL Phase Entry     SOB Score total 39     Rest 0     Walk 3     Stairs 5     Bath 1     Dress 1     Shop 1       CAT Score   CAT Score 8       mMRC Score   mMRC Score 1              UCSD: Self-administered rating of dyspnea associated with activities of daily living (ADLs) 6-point scale (0 = "not at all" to 5 = "maximal or unable to do because of breathlessness")  Scoring Scores range from 0 to 120.  Minimally important difference is 5 units  CAT: CAT can identify the health impairment of COPD patients and is better correlated with disease progression.  CAT has a scoring range of zero to 40. The CAT score is classified into four groups of low (less than 10), medium (10 - 20), high (21-30) and very high (31-40) based on the impact level of disease on health status. A CAT score over 10 suggests significant symptoms.  A worsening CAT score could be explained by an exacerbation, poor medication adherence, poor inhaler technique, or progression of COPD or comorbid conditions.  CAT MCID is 2 points  mMRC: mMRC (Modified Medical Research Council) Dyspnea Scale is used to assess the degree of baseline functional disability in patients of respiratory disease due to dyspnea. No minimal important difference is established. A decrease  in score of 1 point or greater is considered a positive change.   Pulmonary Function Assessment:   Exercise Target Goals: Exercise Program Goal: Individual exercise prescription set using results from initial 6 min walk test and THRR while considering   patient's activity barriers and safety.   Exercise Prescription Goal: Initial exercise prescription builds to 30-45 minutes a day of aerobic activity, 2-3 days per week.  Home exercise guidelines will be given to patient during program as part of exercise prescription that the participant will acknowledge.  Education: Aerobic Exercise: - Group verbal and visual presentation on the components of exercise prescription. Introduces F.I.T.T principle from ACSM for exercise prescriptions.  Reviews F.I.T.T. principles of aerobic exercise including progression. Written material given at graduation. Flowsheet Row Pulmonary Rehab from 08/06/2022 in [facility] Cardiac and Pulmonary Rehab  Education need identified 08/06/22       Education: Resistance Exercise: - Group verbal and visual presentation on the components of exercise prescription. Introduces F.I.T.T principle from ACSM for exercise prescriptions  Reviews F.I.T.T. principles of resistance exercise including progression. Written material given at graduation.    Education: Exercise & Equipment Safety: - Individual verbal instruction and demonstration of equipment use and safety with use of the equipment. Flowsheet Row Pulmonary Rehab from 08/06/2022 in [facility] Cardiac and Pulmonary Rehab  Date 08/06/22  Educator NT  Instruction Review Code 1- Verbalizes Understanding       Education: Exercise Physiology & General Exercise Guidelines: - Group verbal and written instruction with models to review the exercise physiology of the cardiovascular system and associated critical values. Provides general exercise guidelines with specific guidelines to those with heart or lung disease.    Education: Flexibility, Balance, Mind/Body Relaxation: - Group verbal and visual presentation with interactive activity on the components of exercise prescription. Introduces F.I.T.T principle from ACSM for exercise prescriptions. Reviews F.I.T.T. principles of flexibility and  balance exercise training including progression. Also discusses the mind body connection.  Reviews various relaxation techniques to help reduce and manage stress (i.e. Deep breathing, progressive muscle relaxation, and visualization). Balance handout provided to take home. Written material given at graduation.   Activity Barriers & Risk Stratification:  Activity Barriers & Cardiac Risk Stratification - 08/06/22 1050       Activity Barriers & Cardiac Risk Stratification   Activity Barriers Back Problems;Shortness of Breath;Joint Problems             6 Minute Walk:  6 Minute Walk     Row Name 08/06/22 1043         6 Minute Walk   Phase Initial     Distance 1280 feet     Walk Time 6 minutes     # of Rest Breaks 0     MPH 2.42     METS 2.86     RPE 11     Perceived Dyspnea  1     VO2 Peak 10.01     Symptoms Yes (comment)     Comments Chronic Back Pain 9/10     Resting HR 68 bpm     Resting BP 126/74     Resting Oxygen Saturation  98 %     Exercise Oxygen Saturation  during 6 min walk 93 %     Max Ex. HR 105 bpm     Max Ex. BP 160/80     2 Minute Post BP 144/78       Interval HR   1 Minute HR 88  2 Minute HR 97     3 Minute HR 97     4 Minute HR 102     5 Minute HR 104     6 Minute HR 105     2 Minute Post HR 70     Interval Heart Rate? Yes       Interval Oxygen   Interval Oxygen? Yes     Baseline Oxygen Saturation % 98 %     1 Minute Oxygen Saturation % 93 %     1 Minute Liters of Oxygen 0 L     2 Minute Oxygen Saturation % 94 %     2 Minute Liters of Oxygen 0 L     3 Minute Oxygen Saturation % 94 %     3 Minute Liters of Oxygen 0 L     4 Minute Oxygen Saturation % 94 %     4 Minute Liters of Oxygen 0 L     5 Minute Oxygen Saturation % 93 %     5 Minute Liters of Oxygen 0 L     6 Minute Oxygen Saturation % 95 %     6 Minute Liters of Oxygen 0 L     2 Minute Post Oxygen Saturation % 96 %     2 Minute Post Liters of Oxygen 0 L              Oxygen Initial Assessment:  Oxygen Initial Assessment - 07/26/22 1333       Home Oxygen   Home Oxygen Device None    Sleep Oxygen Prescription None    Home Exercise Oxygen Prescription None    Home Resting Oxygen Prescription None    Compliance with Home Oxygen Use Yes      Initial 6 min Walk   Oxygen Used None      Program Oxygen Prescription   Program Oxygen Prescription None      Intervention   Short Term Goals To learn and understand importance of monitoring SPO2 with pulse oximeter and demonstrate accurate use of the pulse oximeter.;To learn and understand importance of maintaining oxygen saturations>88%;To learn and demonstrate proper pursed lip breathing techniques or other breathing techniques. ;To learn and demonstrate proper use of respiratory medications    Long  Term Goals Verbalizes importance of monitoring SPO2 with pulse oximeter and return demonstration;Maintenance of O2 saturations>88%;Exhibits proper breathing techniques, such as pursed lip breathing or other method taught during program session;Compliance with respiratory medication;Demonstrates proper use of MDI's             Oxygen Re-Evaluation:  Oxygen Re-Evaluation     Row Name 08/13/22 1723             Goals/Expected Outcomes   Comments Reviewed PLB technique with pt.  Talked about how it works and it's importance in maintaining their exercise saturations.       Goals/Expected Outcomes Short: Become more profiecient at using PLB.   Long: Become independent at using PLB.                Oxygen Discharge (Final Oxygen Re-Evaluation):  Oxygen Re-Evaluation - 08/13/22 1723       Goals/Expected Outcomes   Comments Reviewed PLB technique with pt.  Talked about how it works and it's importance in maintaining their exercise saturations.    Goals/Expected Outcomes Short: Become more profiecient at using PLB.   Long: Become independent at using PLB.  Initial Exercise  Prescription:  Initial Exercise Prescription - 08/06/22 1100       Date of Initial Exercise RX and Referring Provider   Date 08/06/22    Referring Provider Dr. Sarina Ser, MD      Oxygen   Maintain Oxygen Saturation 88% or higher      Treadmill   MPH 2.4    Grade 0    Minutes 15    METs 2.84      Recumbant Bike   Level 2    RPM 50    Watts 26    Minutes 15    METs 2.86      NuStep   Level 2    SPM 80    Minutes 15    METs 2.86      Biostep-RELP   Level 2    SPM 50    Minutes 15    METs 2.86      Prescription Details   Frequency (times per week) 2    Duration Progress to 30 minutes of continuous aerobic without signs/symptoms of physical distress      Intensity   THRR 40-80% of Max Heartrate 102-136    Ratings of Perceived Exertion 11-13    Perceived Dyspnea 0-4      Progression   Progression Continue to progress workloads to maintain intensity without signs/symptoms of physical distress.      Resistance Training   Training Prescription Yes    Weight 4 lb    Reps 10-15             Perform Capillary Blood Glucose checks as needed.  Exercise Prescription Changes:   Exercise Prescription Changes     Row Name 08/06/22 1100 08/15/22 1700           Response to Exercise   Blood Pressure (Admit) 126/74 138/84      Blood Pressure (Exercise) 160/80 166/86      Blood Pressure (Exit) 144/78 134/72      Heart Rate (Admit) 68 bpm 78 bpm      Heart Rate (Exercise) 105 bpm 118 bpm      Heart Rate (Exit) 70 bpm 80 bpm      Oxygen Saturation (Admit) 98 % 96 %      Oxygen Saturation (Exercise) 93 % 93 %      Oxygen Saturation (Exit) 96 % 97 %      Rating of Perceived Exertion (Exercise) 11 11      Perceived Dyspnea (Exercise) 1 --      Symptoms Chronic Back Pain 9/10 none      Comments Results 1st full day of exercise      Duration -- Progress to 30 minutes of  aerobic without signs/symptoms of physical distress      Intensity -- THRR  unchanged        Progression   Progression -- Continue to progress workloads to maintain intensity without signs/symptoms of physical distress.      Average METs -- 2.62        Resistance Training   Training Prescription -- Yes      Weight -- 4 lb      Reps -- 10-15        Interval Training   Interval Training -- No        Treadmill   MPH -- 1.9      Grade -- 0      Minutes -- 15      METs --  2.45        NuStep   Level -- 2      Minutes -- 15      METs -- 2.8        Oxygen   Maintain Oxygen Saturation -- 88% or higher               Exercise Comments:   Exercise Comments     Row Name 08/13/22 1721           Exercise Comments First full day of exercise!  Patient was oriented to gym and equipment including functions, settings, policies, and procedures.  Patient's individual exercise prescription and treatment plan were reviewed.  All starting workloads were established based on the results of the 6 minute walk test done at initial orientation visit.  The plan for exercise progression was also introduced and progression will be customized based on patient's performance and goals.                Exercise Goals and Review:   Exercise Goals     Row Name 08/06/22 1050             Exercise Goals   Increase Physical Activity Yes       Intervention Provide advice, education, support and counseling about physical activity/exercise needs.;Develop an individualized exercise prescription for aerobic and resistive training based on initial evaluation findings, risk stratification, comorbidities and participant's personal goals.       Expected Outcomes Short Term: Attend rehab on a regular basis to increase amount of physical activity.;Long Term: Add in home exercise to make exercise part of routine and to increase amount of physical activity.;Long Term: Exercising regularly at least 3-5 days a week.       Increase Strength and Stamina Yes       Intervention Provide  advice, education, support and counseling about physical activity/exercise needs.;Develop an individualized exercise prescription for aerobic and resistive training based on initial evaluation findings, risk stratification, comorbidities and participant's personal goals.       Expected Outcomes Short Term: Increase workloads from initial exercise prescription for resistance, speed, and METs.;Short Term: Perform resistance training exercises routinely during rehab and add in resistance training at home;Long Term: Improve cardiorespiratory fitness, muscular endurance and strength as measured by increased METs and functional capacity ( )       Able to understand and use rate of perceived exertion (RPE) scale Yes       Intervention Provide education and explanation on how to use RPE scale       Expected Outcomes Short Term: Able to use RPE daily in rehab to express subjective intensity level;Long Term:  Able to use RPE to guide intensity level when exercising independently       Able to understand and use Dyspnea scale Yes       Intervention Provide education and explanation on how to use Dyspnea scale       Expected Outcomes Short Term: Able to use Dyspnea scale daily in rehab to express subjective sense of shortness of breath during exertion;Long Term: Able to use Dyspnea scale to guide intensity level when exercising independently       Knowledge and understanding of Target Heart Rate Range (THRR) Yes       Intervention Provide education and explanation of THRR including how the numbers were predicted and where they are located for reference       Expected Outcomes Short Term: Able to state/look up THRR;Long Term: Able to  use THRR to govern intensity when exercising independently;Short Term: Able to use daily as guideline for intensity in rehab       Able to check pulse independently Yes       Intervention Provide education and demonstration on how to check pulse in carotid and radial arteries.;Review  the importance of being able to check your own pulse for safety during independent exercise       Expected Outcomes Short Term: Able to explain why pulse checking is important during independent exercise;Long Term: Able to check pulse independently and accurately       Understanding of Exercise Prescription Yes       Intervention Provide education, explanation, and written materials on patient's individual exercise prescription       Expected Outcomes Short Term: Able to explain program exercise prescription;Long Term: Able to explain home exercise prescription to exercise independently                Exercise Goals Re-Evaluation :  Exercise Goals Re-Evaluation     Row Name 08/13/22 1722 08/15/22 1801           Exercise Goal Re-Evaluation   Exercise Goals Review Increase Physical Activity;Able to understand and use rate of perceived exertion (RPE) scale;Knowledge and understanding of Target Heart Rate Range (THRR);Understanding of Exercise Prescription;Increase Strength and Stamina;Able to understand and use Dyspnea scale;Able to check pulse independently Increase Physical Activity;Increase Strength and Stamina;Understanding of Exercise Prescription      Comments Reviewed RPE scale, THR and program prescription with pt today.  Pt voiced understanding and was given a copy of goals to take home. [name] did well for her first session of rehab.  She was able to do level 2 on the Nustep and 1.9 mph on the treadmill. Her RPEs are appropriate  with exercise. We will continue to monitor as she continues to progress in the program.      Expected Outcomes Short: Use RPE daily to regulate intensity.  Long: Follow program prescription in THR. Short: Continue to exercise at initial exercise prescription Long: Increase overall MET level and stamina               Discharge Exercise Prescription (Final Exercise Prescription Changes):  Exercise Prescription Changes - 08/15/22 1700       Response to  Exercise   Blood Pressure (Admit) 138/84    Blood Pressure (Exercise) 166/86    Blood Pressure (Exit) 134/72    Heart Rate (Admit) 78 bpm    Heart Rate (Exercise) 118 bpm    Heart Rate (Exit) 80 bpm    Oxygen Saturation (Admit) 96 %    Oxygen Saturation (Exercise) 93 %    Oxygen Saturation (Exit) 97 %    Rating of Perceived Exertion (Exercise) 11    Symptoms none    Comments 1st full day of exercise    Duration Progress to 30 minutes of  aerobic without signs/symptoms of physical distress    Intensity THRR unchanged      Progression   Progression Continue to progress workloads to maintain intensity without signs/symptoms of physical distress.    Average METs 2.62      Resistance Training   Training Prescription Yes    Weight 4 lb    Reps 10-15      Interval Training   Interval Training No      Treadmill   MPH 1.9    Grade 0    Minutes 15    METs 2.45  NuStep   Level 2    Minutes 15    METs 2.8      Oxygen   Maintain Oxygen Saturation 88% or higher             Nutrition:  Target Goals: Understanding of nutrition guidelines, daily intake of sodium 1500mg , cholesterol 200mg , calories 30% from fat and 7% or less from saturated fats, daily to have 5 or more servings of fruits and vegetables.  Education: All About Nutrition: -Group instruction provided by verbal, written material, interactive activities, discussions, models, and posters to present general guidelines for heart healthy nutrition including fat, fiber, MyPlate, the role of sodium in heart healthy nutrition, utilization of the nutrition label, and utilization of this knowledge for meal planning. Follow up email sent as well. Written material given at graduation.   Biometrics:  Pre Biometrics - 08/06/22 1051       Pre Biometrics   Height 5\' 5"  (1.651 m)    Weight 215 lb 9.6 oz (97.8 kg)    Waist Circumference 41.5 inches    Hip Circumference 43 inches    Waist to Hip Ratio 0.97 %    BMI  (Calculated) 35.88    Single Leg Stand 30 seconds              Nutrition Therapy Plan and Nutrition Goals:  Nutrition Therapy & Goals - 08/20/22 1722       Nutrition Therapy   Diet Heart healthy, low Na    Drug/Food Interactions Statins/Certain Fruits    Protein (specify units) 60-70g    Fiber 25 grams    Whole Grain Foods 3 servings    Saturated Fats 12 max. grams    Fruits and Vegetables 8 servings/day    Sodium 2 grams      Personal Nutrition Goals   Nutrition Goal ST: include 6-8 smaller meals per day including heart healthy fats, fiber, and limiting salt LT: follow MyPlate guidelines    Comments 67 y.o. F admitted to pulmonary rehab for COPD. PMHx includes GERD, hypothyroidism, CKD stg 3, HLD, tobacco use, anxiety/depression, PTSD, vit B12 deficiency, OSA. PSHx includes cholecystectomy, thyroidectomy, parathyroidectomy. Documented homelessness 09/22/21, now housed. Labs reviewed. Medications reviewed atorvastatin, furosemide, synthroid, pantoprazole, celexa. [name] reports being unaware of any CKD stg 3 diagnosis -  discussed how that is in her past medical history and encouraged her to speak with her MD regarding this. [name] reports she would like to get back to 6-8 meals per day and cooking more heart healthy meals as she used to eat this way and enjoyed it - she lives with someone and is not able to eat this way; she hopes to find a new living situation within 2 weeks. She reports not eating much 15-30 oz of water raisin toast or multigrain peaches (canned in it's own juice) and pineapples. L: Small plate of leftovers (usually beans) D: beans with frozen steamable broccoli. Drinks: water with coffee (2-3 10-12oz cups). Discussed heart healthy eating and CKD stg 3 MNT.      Intervention Plan   Intervention Prescribe, educate and counsel regarding individualized specific dietary modifications aiming towards targeted core components such as weight, hypertension, lipid management, diabetes,  heart failure and other comorbidities.    Expected Outcomes Short Term Goal: A plan has been developed with personal nutrition goals set during dietitian appointment.;Short Term Goal: Understand basic principles of dietary content, such as calories, fat, sodium, cholesterol and nutrients.;Long Term Goal: Adherence to prescribed nutrition plan.  Nutrition Assessments:  MEDIFICTS Score Key: ?70 Need to make dietary changes  40-70 Heart Healthy Diet ? 40 Therapeutic Level Cholesterol Diet  Flowsheet Row Pulmonary Rehab from 08/06/2022 in [facility] Cardiac and Pulmonary Rehab  Picture Your Plate Total Score on Admission 72      Picture Your Plate Scores: <16 Unhealthy dietary pattern with much room for improvement. 41-50 Dietary pattern unlikely to meet recommendations for good health and room for improvement. 51-60 More healthful dietary pattern, with some room for improvement.  >60 Healthy dietary pattern, although there may be some specific behaviors that could be improved.   Nutrition Goals Re-Evaluation:   Nutrition Goals Discharge (Final Nutrition Goals Re-Evaluation):   Psychosocial: Target Goals: Acknowledge presence or absence of significant depression and/or stress, maximize coping skills, provide positive support system. Participant is able to verbalize types and ability to use techniques and skills needed for reducing stress and depression.   Education: Stress, Anxiety, and Depression - Group verbal and visual presentation to define topics covered.  Reviews how body is impacted by stress, anxiety, and depression.  Also discusses healthy ways to reduce stress and to treat/manage anxiety and depression.  Written material given at graduation.   Education: Sleep Hygiene -Provides group verbal and written instruction about how sleep can affect your health.  Define sleep hygiene, discuss sleep cycles and impact of sleep habits. Review good sleep hygiene tips.     Initial Review & Psychosocial Screening:  Initial Psych Review & Screening - 07/26/22 1342       Initial Review   Current issues with Current Stress Concerns    Source of Stress Concerns Financial;Chronic Illness      Family Dynamics   Good Support System? Yes   daughters     Barriers   Psychosocial barriers to participate in program There are no identifiable barriers or psychosocial needs.;The patient should benefit from training in stress management and relaxation.      Screening Interventions   Interventions Encouraged to exercise;Provide feedback about the scores to participant    Expected Outcomes Short Term goal: Utilizing psychosocial counselor, staff and physician to assist with identification of specific Stressors or current issues interfering with healing process. Setting desired goal for each stressor or current issue identified.;Long Term Goal: Stressors or current issues are controlled or eliminated.;Short Term goal: Identification and review with participant of any Quality of Life or Depression concerns found by scoring the questionnaire.;Long Term goal: The participant improves quality of Life and PHQ9 Scores as seen by post scores and/or verbalization of changes             Quality of Life Scores:  Scores of 19 and below usually indicate a poorer quality of life in these areas.  A difference of  2-3 points is a clinically meaningful difference.  A difference of 2-3 points in the total score of the Quality of Life Index has been associated with significant improvement in overall quality of life, self-image, physical symptoms, and general health in studies assessing change in quality of life.  PHQ-9: Review Flowsheet  More data exists      08/06/2022 03/26/2022 02/01/2022 10/18/2021 08/10/2021  Depression screen PHQ 2/9  Decreased Interest 0 0 0 0 0  Down, Depressed, Hopeless 0 0 0 0 0  PHQ - 2 Score 0 0 0 0 0  Altered sleeping 1 1 3  - 1  Tired, decreased energy 1  1 0 - 1  Change in appetite 0 0 0 - 0  Feeling bad or failure about yourself  0 0 0 - 0  Trouble concentrating 0 0 0 - 0  Moving slowly or fidgety/restless 0 0 0 - 0  Suicidal thoughts 0 0 0 - 0  PHQ-9 Score 2 2 3  - 2  Difficult doing work/chores Not difficult at all - Not difficult at all - Not difficult at all   Interpretation of Total Score  Total Score Depression Severity:  1-4 = Minimal depression, 5-9 = Mild depression, 10-14 = Moderate depression, 15-19 = Moderately severe depression, 20-27 = Severe depression   Psychosocial Evaluation and Intervention:  Psychosocial Evaluation - 07/26/22 1401       Psychosocial Evaluation & Interventions   Interventions Encouraged to exercise with the program and follow exercise prescription;Stress management education;Relaxation education    Comments Ms. 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 She has noticed that her breathing and stamina have gotten a lot worse in the last year, where it is hard for her to walk even short distances where before she could walk with no problem. She had her thyroid surgery years ago and states she has gained a lot of weight after that she can't seem to shake. She is working really hard on quitting smoking entirely. She tried the patches and had to stop because of their side effects. She has started changing her lifestyle habits, like drinking more water or going outside on her porch instead of smoking. She is down to very sporadically smoking. She mentioned that some triggers are her car (since is smells like smoke), when she is on the phone a lot during her work calls, and when her what used to be very big temper flares up. As far as her temper, she notes that she has made a lot of positive changes over the years and she doesn't let as many things bother her as she used to. She really likes her job at [employer] where she works from home most days. However, she notes that she sits a a lot more  than she used to. She is looking forward to becoming more active, but hesitant on what she can actually do given her worsening health concerns.    Expected Outcomes Short: attend pulmonary rehab for education and exercise. Long: develop and maintain positive self care habits.    Continue Psychosocial Services  Follow up required by staff             Psychosocial Re-Evaluation:   Psychosocial Discharge (Final Psychosocial Re-Evaluation):   Education: Education Goals: Education classes will be provided on a weekly basis, covering required topics. Participant will state understanding/return demonstration of topics presented.  Learning Barriers/Preferences:  Learning Barriers/Preferences - 07/26/22 1342       Learning Barriers/Preferences   Learning Barriers None    Learning Preferences None             General Pulmonary Education Topics:  Infection Prevention: - Provides verbal and written material to individual with discussion of infection control including proper hand washing and proper equipment cleaning during exercise session. Flowsheet Row Pulmonary Rehab from 08/06/2022 in [facility] Cardiac and Pulmonary Rehab  Date 08/06/22  Educator NT  Instruction Review Code 1- Verbalizes Understanding       Falls Prevention: - Provides verbal and written material to individual with discussion of falls prevention and safety. Flowsheet Row Pulmonary Rehab from 08/06/2022 in [facility] Cardiac and Pulmonary Rehab  Date 08/06/22  Educator NT  Instruction Review Code 1-  Verbalizes Understanding       Chronic Lung Disease Review: - Group verbal instruction with posters, models, PowerPoint presentations and videos,  to review new updates, new respiratory medications, new advancements in procedures and treatments. Providing information on websites and "800" numbers for continued self-education. Includes information about supplement oxygen, available portable oxygen systems, continuous and  intermittent flow rates, oxygen safety, concentrators, and Medicare reimbursement for oxygen. Explanation of Pulmonary Drugs, including class, frequency, complications, importance of spacers, rinsing mouth after steroid MDI's, and proper cleaning methods for nebulizers. Review of basic lung anatomy and physiology related to function, structure, and complications of lung disease. Review of risk factors. Discussion about methods for diagnosing sleep apnea and types of masks and machines for OSA. Includes a review of the use of types of environmental controls: home humidity, furnaces, filters, dust mite/pet prevention, HEPA vacuums. Discussion about weather changes, air quality and the benefits of nasal washing. Instruction on Warning signs, infection symptoms, calling MD promptly, preventive modes, and value of vaccinations. Review of effective airway clearance, coughing and/or vibration techniques. Emphasizing that all should Create an Action Plan. Written material given at graduation.   AED/CPR: - Group verbal and written instruction with the use of models to demonstrate the basic use of the AED with the basic ABC's of resuscitation.    Anatomy and Cardiac Procedures: - Group verbal and visual presentation and models provide information about basic cardiac anatomy and function. Reviews the testing methods done to diagnose heart disease and the outcomes of the test results. Describes the treatment choices: Medical Management, Angioplasty, or Coronary Bypass Surgery for treating various heart conditions including Myocardial Infarction, Angina, Valve Disease, and Cardiac Arrhythmias.  Written material given at graduation.   Medication Safety: - Group verbal and visual instruction to review commonly prescribed medications for heart and lung disease. Reviews the medication, class of the drug, and side effects. Includes the steps to properly store meds and maintain the prescription regimen.  Written material  given at graduation.   Other: -Provides group and verbal instruction on various topics (see comments)   Knowledge Questionnaire Score:  Knowledge Questionnaire Score - 08/06/22 1031       Knowledge Questionnaire Score   Pre Score 14/18              Core Components/Risk Factors/Patient Goals at Admission:  Personal Goals and Risk Factors at Admission - 08/06/22 1035       Core Components/Risk Factors/Patient Goals on Admission    Weight Management Yes;Weight Gain    Intervention Weight Management: Develop a combined nutrition and exercise program designed to reach desired caloric intake, while maintaining appropriate intake of nutrient and fiber, sodium and fats, and appropriate energy expenditure required for the weight goal.;Weight Management: Provide education and appropriate resources to help participant work on and attain dietary goals.;Weight Management/Obesity: Establish reasonable short term and long term weight goals.    Admit Weight 215 lb 9.6 oz (97.8 kg)    Goal Weight: Short Term 205 lb (93 kg)    Goal Weight: Long Term 175 lb (79.4 kg)    Expected Outcomes Short Term: Continue to assess and modify interventions until short term weight is achieved;Long Term: Adherence to nutrition and physical activity/exercise program aimed toward attainment of established weight goal;Weight Loss: Understanding of general recommendations for a balanced deficit meal plan, which promotes 1-2 lb weight loss per week and includes a negative energy balance of [phone] kcal/d;Understanding recommendations for meals to include 15-35% energy as protein, 25-35%  energy from fat, 35-60% energy from carbohydrates, less than  of dietary cholesterol, 20-35 gm of total fiber daily;Understanding of distribution of calorie intake throughout the day with the consumption of 4-5 meals/snacks    Tobacco Cessation Yes    Number of packs per day --   inconsistent   Intervention Assist the participant in  steps to quit. Provide individualized education and counseling about committing to Tobacco Cessation, relapse prevention, and pharmacological support that can be provided by physician.;Education officer, environmental, assist with locating and accessing local/national Quit Smoking programs, and support quit date choice.    Expected Outcomes Short Term: Will demonstrate readiness to quit, by selecting a quit date.;Short Term: Will quit all tobacco product use, adhering to prevention of relapse plan.;Long Term: Complete abstinence from all tobacco products for at least 12 months from quit date.    Improve shortness of breath with ADL's Yes    Intervention Provide education, individualized exercise plan and daily activity instruction to help decrease symptoms of SOB with activities of daily living.    Expected Outcomes Short Term: Improve cardiorespiratory fitness to achieve a reduction of symptoms when performing ADLs;Long Term: Be able to perform more ADLs without symptoms or delay the onset of symptoms    Hypertension Yes    Intervention Provide education on lifestyle modifcations including regular physical activity/exercise, weight management, moderate sodium restriction and increased consumption of fresh fruit, vegetables, and low fat dairy, alcohol moderation, and smoking cessation.;Monitor prescription use compliance.    Expected Outcomes Short Term: Continued assessment and intervention until BP is < 140/55mm HG in hypertensive participants. < 130/28mm HG in hypertensive participants with diabetes, heart failure or chronic kidney disease.;Long Term: Maintenance of blood pressure at goal levels.    Lipids Yes    Intervention Provide education and support for participant on nutrition & aerobic/resistive exercise along with prescribed medications to achieve LDL 70mg , HDL >40mg .    Expected Outcomes Short Term: Participant states understanding of desired cholesterol values and is compliant with medications  prescribed. Participant is following exercise prescription and nutrition guidelines.;Long Term: Cholesterol controlled with medications as prescribed, with individualized exercise RX and with personalized nutrition plan. Value goals: LDL < , HDL > 40 mg.             Education:Diabetes - Individual verbal and written instruction to review signs/symptoms of diabetes, desired ranges of glucose level fasting, after meals and with exercise. Acknowledge that pre and post exercise glucose checks will be done for 3 sessions at entry of program.   Know Your Numbers and Heart Failure: - Group verbal and visual instruction to discuss disease risk factors for cardiac and pulmonary disease and treatment options.  Reviews associated critical values for Overweight/Obesity, Hypertension, Cholesterol, and Diabetes.  Discusses basics of heart failure: signs/symptoms and treatments.  Introduces Heart Failure Zone chart for action plan for heart failure.  Written material given at graduation. Flowsheet Row Pulmonary Rehab from 08/06/2022 in [facility] Cardiac and Pulmonary Rehab  Education need identified 08/06/22       Core Components/Risk Factors/Patient Goals Review:    Core Components/Risk Factors/Patient Goals at Discharge (Final Review):    ITP Comments:  ITP Comments     Row Name 07/26/22 1350 08/06/22 1028 08/13/22 1721 08/22/22 1402     ITP Comments Initial phone call completed. Diagnosis can be found in [facility] 12/1. EP Orientation scheduled for Monday 4/1 at 9am.       Cyara is a current tobacco user. Intervention for tobacco cessation  was provided at the initial medical review. She was asked about readiness to quit and reported she is ready to quit . Patient was advised and educated about tobacco cessation using combination therapy, tobacco cessation classes, quit line, and quit smoking apps. Patient demonstrated understanding of this material. Staff will continue to provide encouragement and follow  up with the patient throughout the program. Completed and gym orientation. Initial ITP created and sent for review to Dr. Vida Rigger, Medical Director. First full day of exercise!  Patient was oriented to gym and equipment including functions, settings, policies, and procedures.  Patient's individual exercise prescription and treatment plan were reviewed.  All starting workloads were established based on the results of the 6 minute walk test done at initial orientation visit.  The plan for exercise progression was also introduced and progression will be customized based on patient's performance and goals. 30 day review completed. ITP sent to Dr. Jinny Sanders, Medical Director of  Pulmonary Rehab. Continue with ITP unless changes are made by physician. Pt still new to program.             Comments: 30 day review

## 2022-08-23 ENCOUNTER — Encounter: Payer: BC Managed Care – PPO | Admitting: *Deleted

## 2022-08-23 DIAGNOSIS — J449 Chronic obstructive pulmonary disease, unspecified: Secondary | ICD-10-CM | POA: Diagnosis not present

## 2022-08-23 NOTE — Progress Notes (Signed)
Daily Session Note  Patient Details  Name: [name] MRN: [id number] Date of Birth: [birth year] Referring Provider:   Flowsheet Row Pulmonary Rehab from 08/06/2022 in [facility] Cardiac and Pulmonary Rehab  Referring Provider Dr. Sarina Ser, MD       Encounter Date: 08/23/2022  Check In:  Session Check In - 08/23/22 1719       Check-In   Supervising physician immediately available to respond to emergencies See telemetry face sheet for immediately available ER MD    Location [name]    Staff Present [name], RN BSN;Joseph [place], RCP,RRT,BSRT;Noah Tickle, [place], Exercise Physiologist    Virtual Visit No    Medication changes reported     No    Fall or balance concerns reported    No    Tobacco Cessation No Change    Current number of cigarettes/nicotine per day     1    Warm-up and Cool-down Performed on first and last piece of equipment    Resistance Training Performed Yes    VAD Patient? No    PAD/SET Patient? No      Pain Assessment   Currently in Pain? No/denies                Social History   Tobacco Use  Smoking Status Every Day   Packs/day: 1.50   Years: 39.00   Additional pack years: 0.00   Total pack years: 58.50   Types: Cigarettes  Smokeless Tobacco Never  Tobacco Comments   2 cigarettes a day- 08/06/2022 khj    Goals Met:  Independence with exercise equipment Exercise tolerated well No report of concerns or symptoms today Strength training completed today  Goals Unmet:  Not Applicable  Comments: Pt able to follow exercise prescription today without complaint.  Will continue to monitor for progression.    Dr. Bethann Punches is Medical Director for [facility] Cardiac Rehabilitation.  Dr. Vida Rigger is Medical Director for [facility] Pulmonary Rehabilitation.

## 2022-08-27 ENCOUNTER — Encounter: Payer: BC Managed Care – PPO | Admitting: *Deleted

## 2022-08-27 DIAGNOSIS — J449 Chronic obstructive pulmonary disease, unspecified: Secondary | ICD-10-CM

## 2022-08-27 NOTE — Progress Notes (Signed)
Daily Session Note  Patient Details  Name: [name] MRN: [id number] Date of Birth: January 28, 1956 Referring Provider:   Flowsheet Row Pulmonary Rehab from 08/06/2022 in [facility] Cardiac and Pulmonary Rehab  Referring Provider Dr. Sarina Ser, MD       Encounter Date: 08/27/2022  Check In:  Session Check In - 08/27/22 1715       Check-In   Supervising physician immediately available to respond to emergencies See telemetry face sheet for immediately available ER MD    Location [name]    Staff Present [name], RN BSN;Joseph [place], RCP,RRT,BSRT;Noah [place], [place], Exercise Physiologist    Virtual Visit No    Medication changes reported     No    Fall or balance concerns reported    No    Tobacco Cessation Use Increase    Current number of cigarettes/nicotine per day     4    Warm-up and Cool-down Performed on first and last piece of equipment    Resistance Training Performed Yes    VAD Patient? No    PAD/SET Patient? No      Pain Assessment   Currently in Pain? No/denies                Social History   Tobacco Use  Smoking Status Every Day   Packs/day: 1.50   Years: 39.00   Additional pack years: 0.00   Total pack years: 58.50   Types: Cigarettes  Smokeless Tobacco Never  Tobacco Comments   2 cigarettes a day- 08/06/2022 khj    Goals Met:  Independence with exercise equipment Exercise tolerated well No report of concerns or symptoms today Strength training completed today  Goals Unmet:  Not Applicable  Comments: Pt able to follow exercise prescription today without complaint.  Will continue to monitor for progression.    Dr. Bethann Punches is Medical Director for [facility] Cardiac Rehabilitation.  Dr. Vida Rigger is Medical Director for [facility] Pulmonary Rehabilitation.

## 2022-08-30 ENCOUNTER — Encounter: Payer: BC Managed Care – PPO | Admitting: *Deleted

## 2022-08-30 DIAGNOSIS — J449 Chronic obstructive pulmonary disease, unspecified: Secondary | ICD-10-CM | POA: Diagnosis not present

## 2022-08-30 DIAGNOSIS — R0602 Shortness of breath: Secondary | ICD-10-CM

## 2022-08-30 NOTE — Progress Notes (Signed)
Daily Session Note  Patient Details  Name: [name] MRN: [id number] Date of Birth: Sep 03, 1955 Referring Provider:   Flowsheet Row Pulmonary Rehab from 08/06/2022 in [facility] Cardiac and Pulmonary Rehab  Referring Provider Dr. Sarina Ser, MD       Encounter Date: 08/30/2022  Check In:  Session Check In - 08/30/22 1807       Check-In   Supervising physician immediately available to respond to emergencies See telemetry face sheet for immediately available ER MD    Location [name]    Staff Present Cora Collum, RN, BSN, Osa Craver, MS, ACSM CEP, Exercise Physiologist;Noah Tickle, BS, Exercise Physiologist;Other   [place]elow HFS, Terrilyn Saver   Virtual Visit No    Medication changes reported     No    Fall or balance concerns reported    No    Warm-up and Cool-down Performed on first and last piece of equipment    Resistance Training Performed Yes    VAD Patient? No    PAD/SET Patient? No      Pain Assessment   Currently in Pain? No/denies                Social History   Tobacco Use  Smoking Status Every Day   Packs/day: 1.50   Years: 39.00   Additional pack years: 0.00   Total pack years: 58.50   Types: Cigarettes  Smokeless Tobacco Never  Tobacco Comments   2 cigarettes a day- 08/06/2022 khj    Goals Met:  Proper associated with RPD/PD & O2 Sat Independence with exercise equipment Exercise tolerated well No report of concerns or symptoms today  Goals Unmet:  Not Applicable  Comments: Pt able to follow exercise prescription today without complaint.  Will continue to monitor for progression.    Dr. Bethann Punches is Medical Director for [facility] Cardiac Rehabilitation.  Dr. Vida Rigger is Medical Director for [facility] Pulmonary Rehabilitation.

## 2022-09-03 ENCOUNTER — Encounter: Payer: BC Managed Care – PPO | Admitting: *Deleted

## 2022-09-03 DIAGNOSIS — J449 Chronic obstructive pulmonary disease, unspecified: Secondary | ICD-10-CM

## 2022-09-03 NOTE — Progress Notes (Signed)
Daily Session Note  Patient Details  Name: [name] MRN: [id number] Date of Birth: 07/08/1955 Referring Provider:   Flowsheet Row Pulmonary Rehab from 08/06/2022 in [facility] Cardiac and Pulmonary Rehab  Referring Provider Dr. Sarina Ser, MD       Encounter Date: 09/03/2022  Check In:  Session Check In - 09/03/22 1723       Check-In   Supervising physician immediately available to respond to emergencies See telemetry face sheet for immediately available ER MD    Location [name]    Staff Present [name], RN BSN;Joseph [place], RCP,RRT,BSRT;Noah [place], [place], Exercise Physiologist    Virtual Visit No    Medication changes reported     No    Fall or balance concerns reported    No    Tobacco Cessation No Change    Warm-up and Cool-down Performed on first and last piece of equipment    Resistance Training Performed Yes    VAD Patient? No    PAD/SET Patient? No      Pain Assessment   Currently in Pain? No/denies                Social History   Tobacco Use  Smoking Status Every Day   Packs/day: 1.50   Years: 39.00   Additional pack years: 0.00   Total pack years: 58.50   Types: Cigarettes  Smokeless Tobacco Never  Tobacco Comments   2 cigarettes a day- 08/06/2022 khj    Goals Met:  Independence with exercise equipment Exercise tolerated well No report of concerns or symptoms today Strength training completed today  Goals Unmet:  Not Applicable  Comments: Pt able to follow exercise prescription today without complaint.  Will continue to monitor for progression.    Dr. Bethann Punches is Medical Director for [facility] Cardiac Rehabilitation.  Dr. Vida Rigger is Medical Director for [facility] Pulmonary Rehabilitation.

## 2022-09-07 ENCOUNTER — Other Ambulatory Visit: Payer: Self-pay | Admitting: Family Medicine

## 2022-09-10 ENCOUNTER — Encounter: Payer: BC Managed Care – PPO | Attending: Pulmonary Disease | Admitting: *Deleted

## 2022-09-10 DIAGNOSIS — J449 Chronic obstructive pulmonary disease, unspecified: Secondary | ICD-10-CM | POA: Insufficient documentation

## 2022-09-10 DIAGNOSIS — R0602 Shortness of breath: Secondary | ICD-10-CM | POA: Diagnosis present

## 2022-09-10 NOTE — Progress Notes (Signed)
Daily Session Note  Patient Details  Name: [name] MRN: [id number] Date of Birth: 05-Jun-1955 Referring Provider:   Flowsheet Row Pulmonary Rehab from 08/06/2022 in [facility] Card[facility] and Pulmonary Rehab  Referring Provider Dr. Sarina Ser, MD       Encounter Date: 09/10/2022  Check In:  Session Check In - 09/10/22 1715       Check-In   Supervising physician immediately available to respond to emergencies See telemetry face sheet for immediately available ER MD    Location [name]    Staff Present [name], RN BSN;Noah Tickle, BS, Exercise Physiologist;Joseph Reino [place], [place]    Virtual Visit No    Medication changes reported     No    Fall or balance concerns reported    No    Tobacco Cessation No Change    Current number of cigarettes/nicotine per day     4    Warm-up and Cool-down Performed on first and last piece of equipment    Resistance Training Performed Yes    VAD Patient? No    PAD/SET Patient? No      Pain Assessment   Currently in Pain? No/denies                Social History   Tobacco Use  Smoking Status Every Day   Packs/day: 1.50   Years: 39.00   Additional pack years: 0.00   Total pack years: 58.50   Types: Cigarettes  Smokeless Tobacco Never  Tobacco Comments   2 cigarettes a day- 08/06/2022 khj    Goals Met:  Independence with exercise equipment Exercise tolerated well No report of concerns or symptoms today Strength training completed today  Goals Unmet:  Not Applicable  Comments: Pt able to follow exercise prescription today without complaint.  Will continue to monitor for progression.    Dr. Bethann Punches is Medical Director for [facility] Cardiac Rehabilitation.  Dr. Vida Rigger is Medical Director for [facility] Pulmonary Rehabilitation.

## 2022-09-10 NOTE — Telephone Encounter (Signed)
Labs in date  Requested Prescriptions  Pending Prescriptions Disp Refills   atorvastatin (LIPITOR) 40 MG tablet [Pharmacy Med Name: ATORVASTATIN 40 MG TABLET] 90 tablet 0    Sig: TAKE 1 TABLET BY MOUTH DAILY     Cardiovascular:  Antilipid - Statins Failed - 09/07/2022  6:01 PM      Failed - Lipid Panel in normal range within the last 12 months    Cholesterol, Total  Date Value Ref Range Status  03/26/2022 180 100 - 199 mg/dL Final   LDL Chol Calc (NIH)  Date Value Ref Range Status  03/26/2022 105 (H) 0 - 99 mg/dL Final   Direct LDL  Date Value Ref Range Status  03/18/2007 150.0 mg/dL Final    Comment:    See lab report for associated comment(s)   HDL  Date Value Ref Range Status  03/26/2022 55 >39 mg/dL Final   Triglycerides  Date Value Ref Range Status  03/26/2022 112 0 - 149 mg/dL Final         Passed - Patient is not pregnant      Passed - Valid encounter within last 12 months    Recent Outpatient Visits           3 months ago Tobacco abuse   [place] [facility] [place], [place], DO   4 months ago Tobacco abuse   [place] [facility] [place], Megan P, DO   5 months ago Acute non-recurrent maxillary sinusitis   [place] [facility] [place], Megan P, DO   6 months ago Upper respiratory tract infection, unspecified type   [place] [facility] [place], Erin E, PA-C   11 months ago Hypothyroidism, unspecified type   [place] [facility] [place], Oralia Rud, DO       Future Appointments             In 2 weeks Dorcas Carrow, DO [place] [facility]   In 1 month Duke Salvia, MD [facility]

## 2022-09-12 ENCOUNTER — Encounter: Payer: BC Managed Care – PPO | Admitting: *Deleted

## 2022-09-12 DIAGNOSIS — J449 Chronic obstructive pulmonary disease, unspecified: Secondary | ICD-10-CM | POA: Diagnosis not present

## 2022-09-12 DIAGNOSIS — R0602 Shortness of breath: Secondary | ICD-10-CM

## 2022-09-12 NOTE — Progress Notes (Signed)
Daily Session Note  Patient Details  Name: [name] MRN: [id number] Date of Birth: April 27, 1956 Referring Provider:   Flowsheet Row Pulmonary Rehab from 08/06/2022 in [facility] Cardiac and Pulmonary Rehab  Referring Provider Dr. Sarina Ser, MD       Encounter Date: 09/12/2022  Check In:  Session Check In - 09/12/22 1649       Check-In   Supervising physician immediately available to respond to emergencies See telemetry face sheet for immediately available ER MD    Location [name]    Staff Present [name], RN Mabeline Caras, BS, ACSM CEP, Exercise Physiologist;Joseph Reino [place], [place]    Virtual Visit No    Medication changes reported     No    Fall or balance concerns reported    No    Tobacco Cessation No Change    Current number of cigarettes/nicotine per day     4    Warm-up and Cool-down Performed on first and last piece of equipment    Resistance Training Performed Yes    VAD Patient? No    PAD/SET Patient? No      Pain Assessment   Currently in Pain? No/denies                Social History   Tobacco Use  Smoking Status Every Day   Packs/day: 1.50   Years: 39.00   Additional pack years: 0.00   Total pack years: 58.50   Types: Cigarettes  Smokeless Tobacco Never  Tobacco Comments   2 cigarettes a day- 08/06/2022 khj    Goals Met:  Independence with exercise equipment Exercise tolerated well Personal goals reviewed No report of concerns or symptoms today Strength training completed today  Goals Unmet:  Not Applicable  Comments: Pt able to follow exercise prescription today without complaint.  Will continue to monitor for progression.    Dr. Bethann Punches is Medical Director for [facility] Cardiac Rehabilitation.  Dr. Vida Rigger is Medical Director for [facility] Pulmonary Rehabilitation.

## 2022-09-13 ENCOUNTER — Telehealth: Payer: Self-pay | Admitting: Pulmonary Disease

## 2022-09-13 ENCOUNTER — Encounter: Payer: BC Managed Care – PPO | Admitting: *Deleted

## 2022-09-13 DIAGNOSIS — R0602 Shortness of breath: Secondary | ICD-10-CM

## 2022-09-13 DIAGNOSIS — J449 Chronic obstructive pulmonary disease, unspecified: Secondary | ICD-10-CM

## 2022-09-13 MED ORDER — STIOLTO RESPIMAT 2.5-2.5 MCG/ACT IN AERS: 2.0000 | INHALATION_SPRAY | Freq: Every day | RESPIRATORY_TRACT | 5 refills | Status: AC

## 2022-09-13 NOTE — Progress Notes (Signed)
Daily Session Note  Patient Details  Name: [name] MRN: [id number] Date of Birth: 1956-01-22 Referring Provider:   Flowsheet Row Pulmonary Rehab from 08/06/2022 in [facility] Cardiac and Pulmonary Rehab  Referring Provider Dr. Sarina Ser, MD       Encounter Date: 09/13/2022  Check In:  Session Check In - 09/13/22 1717       Check-In   Supervising physician immediately available to respond to emergencies See telemetry face sheet for immediately available ER MD    Location [name]    Staff Present [name], RN BSN;Noah Tickle, BS, Exercise Physiologist;Joseph Reino [place], [place]    Virtual Visit No    Medication changes reported     No    Fall or balance concerns reported    No    Tobacco Cessation No Change    Current number of cigarettes/nicotine per day     4    Warm-up and Cool-down Performed on first and last piece of equipment    Resistance Training Performed Yes    VAD Patient? No    PAD/SET Patient? No      Pain Assessment   Currently in Pain? No/denies                Social History   Tobacco Use  Smoking Status Every Day   Packs/day: 1.50   Years: 39.00   Additional pack years: 0.00   Total pack years: 58.50   Types: Cigarettes  Smokeless Tobacco Never  Tobacco Comments   2 cigarettes a day- 08/06/2022 khj    Goals Met:  Independence with exercise equipment Exercise tolerated well No report of concerns or symptoms today Strength training completed today  Goals Unmet:  Not Applicable  Comments: Pt able to follow exercise prescription today without complaint.  Will continue to monitor for progression.    Dr. Bethann Punches is Medical Director for [facility] Cardiac Rehabilitation.  Dr. Vida Rigger is Medical Director for [facility] Pulmonary Rehabilitation.

## 2022-09-13 NOTE — Telephone Encounter (Signed)
Pt. Calling needs refills for Tiotropium Bromide-Olodaterol (STIOLTO RESPIMAT) 2.5-2.5 MCG/ACT AERS  sent to pharm. She is ding well with med and was only given sample last time

## 2022-09-13 NOTE — Telephone Encounter (Signed)
I have sent in the prescription and notified the patient.  Nothing further needed. 

## 2022-09-17 ENCOUNTER — Telehealth: Payer: Self-pay | Admitting: Pulmonary Disease

## 2022-09-17 ENCOUNTER — Encounter: Payer: Medicare Other | Admitting: *Deleted

## 2022-09-17 DIAGNOSIS — J449 Chronic obstructive pulmonary disease, unspecified: Secondary | ICD-10-CM

## 2022-09-17 DIAGNOSIS — G4733 Obstructive sleep apnea (adult) (pediatric): Secondary | ICD-10-CM

## 2022-09-17 NOTE — Progress Notes (Signed)
Daily Session Note  Patient Details  Name: [name] MRN: [id number] Date of Birth: 13-Jan-1956 Referring Provider:   Flowsheet Row Pulmonary Rehab from 08/06/2022 in [facility] Cardiac and Pulmonary Rehab  Referring Provider Dr. Sarina Ser, MD       Encounter Date: 09/17/2022  Check In:  Session Check In - 09/17/22 1735       Check-In   Supervising physician immediately available to respond to emergencies See telemetry face sheet for immediately available ER MD    Location [name]    Staff Present Lanny Hurst, RN, ADN;Joseph Hood, RCP,RRT,BSRT;Noah Tickle, BS, Exercise Physiologist    Virtual Visit No    Medication changes reported     No    Fall or balance concerns reported    No    Tobacco Cessation No Change    Warm-up and Cool-down Performed on first and last piece of equipment    Resistance Training Performed Yes    VAD Patient? No    PAD/SET Patient? No      Pain Assessment   Currently in Pain? No/denies                Social History   Tobacco Use  Smoking Status Every Day   Packs/day: 1.50   Years: 39.00   Additional pack years: 0.00   Total pack years: 58.50   Types: Cigarettes  Smokeless Tobacco Never  Tobacco Comments   2 cigarettes a day- 08/06/2022 khj    Goals Met:  Independence with exercise equipment Exercise tolerated well No report of concerns or symptoms today Strength training completed today  Goals Unmet:  Not Applicable  Comments: Pt able to follow exercise prescription today without complaint.  Will continue to monitor for progression.    Dr. Bethann Punches is Medical Director for [facility] Cardiac Rehabilitation.  Dr. Vida Rigger is Medical Director for [facility] Pulmonary Rehabilitation.

## 2022-09-17 NOTE — Telephone Encounter (Signed)
Called and spoke to patient.  She stated that she received a call from [facility] stating that she is non complaint with cpap. She stated that [facility] stated that we need to request a restart.  Patient will start wearing cpap 4-6hr nightly.   [name], can you assist with this? What is needed for a restart?

## 2022-09-17 NOTE — Telephone Encounter (Signed)
[phone] Please call PT per her request. It has to do w/CPAP she needs a restart because of non-compliance. She is not even @ 25% compliance.

## 2022-09-18 NOTE — Telephone Encounter (Signed)
I have sent an email to [facility] with [facility] to check on this issue

## 2022-09-19 ENCOUNTER — Encounter: Payer: Self-pay | Admitting: *Deleted

## 2022-09-19 DIAGNOSIS — R0602 Shortness of breath: Secondary | ICD-10-CM

## 2022-09-19 DIAGNOSIS — J449 Chronic obstructive pulmonary disease, unspecified: Secondary | ICD-10-CM

## 2022-09-19 NOTE — Progress Notes (Signed)
Pulmonary Individual Treatment Plan  Patient Details  Name: [name] MRN: [id number] Date of Birth: [birth year] Referring Provider:   Flowsheet Row Pulmonary Rehab from 08/06/2022 in [facility] Cardiac and Pulmonary Rehab  Referring Provider Dr. Sarina Ser, MD       Initial Encounter Date:  Flowsheet Row Pulmonary Rehab from 08/06/2022 in [facility] Cardiac and Pulmonary Rehab  Date 08/06/22       Visit Diagnosis: Chronic obstructive pulmonary disease, unspecified COPD type (HCC)  Shortness of breath  Patient's Home Medications on Admission:  Current Outpatient Medications:    albuterol (VENTOLIN HFA) 108 (90 Base) MCG/ACT inhaler, Inhale 2 puffs into the lungs every 6 (six) hours as needed for wheezing or shortness of breath., Disp: 8 g, Rfl: 2   atorvastatin (LIPITOR) 40 MG tablet, TAKE 1 TABLET BY MOUTH DAILY, Disp: 90 tablet, Rfl: 0   citalopram (CELEXA) 40 MG tablet, Take 1.5 tablets (60 mg total) by mouth daily., Disp: 135 tablet, Rfl: 1   furosemide (LASIX) 20 MG tablet, Take 1 tablet (20 mg total) by mouth 3 (three) times a week., Disp: 45 tablet, Rfl: 3   levothyroxine (SYNTHROID) 125 MCG tablet, TAKE ONE TABLET BY MOUTH DAILY WITH BREAKFAST, Disp: 90 tablet, Rfl: 3   pantoprazole (PROTONIX) 20 MG tablet, Take 1 tablet (20 mg total) by mouth daily., Disp: 90 tablet, Rfl: 3   propafenone (RYTHMOL) 225 MG tablet, TAKE 1 TABLET BY MOUTH DAILY AS NEEDED FOR AFIB, Disp: 30 tablet, Rfl: 1   Spacer/Aero-Holding Chambers DEVI, 1 Device by Does not apply route as needed., Disp: 2 each, Rfl: 0   Tiotropium Bromide-Olodaterol (STIOLTO RESPIMAT) 2.5-2.5 MCG/ACT AERS, Inhale 2 puffs into the lungs daily., Disp: 4 g, Rfl: 5 No current facility-administered medications for this visit.  Facility-Administered Medications Ordered in Other Visits:    albuterol (PROVENTIL) (2.5 MG/3ML) 0.083% nebulizer solution 2.5 mg, 2.5 mg, Nebulization, Once, Duke Salvia, MD  Past Medical History: Past  Medical History:  Diagnosis Date   Anxiety state, unspecified    Back pain    Chest pain    a. 2008 Cath: reportedly nl;  b. 09/2012 Lexi MV: EF 75%, soft tissue attenuation->Low risk.   COPD (chronic obstructive pulmonary disease) (HCC)    CTS (carpal tunnel syndrome)    Depression    GERD (gastroesophageal reflux disease)    Migraines    Neck pain    Nontoxic multinodular goiter    Other and unspecified hyperlipidemia    Other and unspecified ovarian cyst    left   Other chest pain    PAF (paroxysmal atrial fibrillation) (HCC)    a. 07/2007 Echo: EF 65%, mildly dil LA;  b. currently on propafenone;  c. CHA2DS2VASc = 1 (gender)-->No anticoagulation.   PTSD (post-traumatic stress disorder)    Tobacco use    Tobacco use disorder    Unspecified hypothyroidism    Unspecified sleep apnea    resolved s/p UPPP surgery   Vitamin B12 deficiency     Tobacco Use: Social History   Tobacco Use  Smoking Status Every Day   Packs/day: 1.50   Years: 39.00   Additional pack years: 0.00   Total pack years: 58.50   Types: Cigarettes  Smokeless Tobacco Never  Tobacco Comments   2 cigarettes a day- 08/06/2022 khj    Labs: Review Flowsheet  More data exists      Latest Ref Rng & Units 05/09/2008 10/09/2017 11/20/2019 01/30/2021 03/26/2022  Labs for ITP Cardiac and Pulmonary  Rehab  Cholestrol 100 - 199 mg/dL 161        ATP III CLASSIFICATION:  <200     mg/dL   Desirable  096-045  mg/dL   Borderline High  >=409    mg/dL   High         811  914  149  180   LDL (calc) 0 - 99 mg/dL 782        Total Cholesterol/HDL:CHD Risk Coronary Heart Disease Risk Table                     Men   Women  1/2 Average Risk   3.4   3.3  Average Risk       5.0   4.4  2 X Average Risk   9.6   7.1  3 X Average Risk  23.4   11.0        Use the calculated Patient Ratio above and the CHD Risk Table to determine the patient's CHD Risk.        ATP III CLASSIFICATION (LDL):  <100     mg/dL   Optimal  956-213   mg/dL   Near or Above                    Optimal  130-159  mg/dL   Borderline  086-578  mg/dL   High  >469     mg/dL   Very High  629  528  82  105   HDL-C >39 mg/dL 36  45  41  39  55   Trlycerides 0 - 149 mg/dL 413  244  010  272  536   Hemoglobin A1c 4.8 - 5.6 % - - - 5.5  -     Pulmonary Assessment Scores:  Pulmonary Assessment Scores     Row Name 08/06/22 1034         ADL UCSD   ADL Phase Entry     SOB Score total 39     Rest 0     Walk 3     Stairs 5     Bath 1     Dress 1     Shop 1       CAT Score   CAT Score 8       mMRC Score   mMRC Score 1              UCSD: Self-administered rating of dyspnea associated with activities of daily living (ADLs) 6-point scale (0 = "not at all" to 5 = "maximal or unable to do because of breathlessness")  Scoring Scores range from 0 to 120.  Minimally important difference is 5 units  CAT: CAT can identify the health impairment of COPD patients and is better correlated with disease progression.  CAT has a scoring range of zero to 40. The CAT score is classified into four groups of low (less than 10), medium (10 - 20), high (21-30) and very high (31-40) based on the impact level of disease on health status. A CAT score over 10 suggests significant symptoms.  A worsening CAT score could be explained by an exacerbation, poor medication adherence, poor inhaler technique, or progression of COPD or comorbid conditions.  CAT MCID is 2 points  mMRC: mMRC (Modified Medical Research Council) Dyspnea Scale is used to assess the degree of baseline functional disability in patients of respiratory disease due to dyspnea. No minimal important difference is established. A decrease  in score of 1 point or greater is considered a positive change.   Pulmonary Function Assessment:   Exercise Target Goals: Exercise Program Goal: Individual exercise prescription set using results from initial 6 min walk test and THRR while considering   patient's activity barriers and safety.   Exercise Prescription Goal: Initial exercise prescription builds to 30-45 minutes a day of aerobic activity, 2-3 days per week.  Home exercise guidelines will be given to patient during program as part of exercise prescription that the participant will acknowledge.  Education: Aerobic Exercise: - Group verbal and visual presentation on the components of exercise prescription. Introduces F.I.T.T principle from ACSM for exercise prescriptions.  Reviews F.I.T.T. principles of aerobic exercise including progression. Written material given at graduation. Flowsheet Row Pulmonary Rehab from 09/12/2022 in [facility] Cardiac and Pulmonary Rehab  Education need identified 08/06/22       Education: Resistance Exercise: - Group verbal and visual presentation on the components of exercise prescription. Introduces F.I.T.T principle from ACSM for exercise prescriptions  Reviews F.I.T.T. principles of resistance exercise including progression. Written material given at graduation.    Education: Exercise & Equipment Safety: - Individual verbal instruction and demonstration of equipment use and safety with use of the equipment. Flowsheet Row Pulmonary Rehab from 09/12/2022 in [facility] Cardiac and Pulmonary Rehab  Date 08/06/22  Educator NT  Instruction Review Code 1- Verbalizes Understanding       Education: Exercise Physiology & General Exercise Guidelines: - Group verbal and written instruction with models to review the exercise physiology of the cardiovascular system and associated critical values. Provides general exercise guidelines with specific guidelines to those with heart or lung disease.    Education: Flexibility, Balance, Mind/Body Relaxation: - Group verbal and visual presentation with interactive activity on the components of exercise prescription. Introduces F.I.T.T principle from ACSM for exercise prescriptions. Reviews F.I.T.T. principles of flexibility and  balance exercise training including progression. Also discusses the mind body connection.  Reviews various relaxation techniques to help reduce and manage stress (i.e. Deep breathing, progressive muscle relaxation, and visualization). Balance handout provided to take home. Written material given at graduation.   Activity Barriers & Risk Stratification:  Activity Barriers & Cardiac Risk Stratification - 08/06/22 1050       Activity Barriers & Cardiac Risk Stratification   Activity Barriers Back Problems;Shortness of Breath;Joint Problems             6 Minute Walk:  6 Minute Walk     Row Name 08/06/22 1043         6 Minute Walk   Phase Initial     Distance 1280 feet     Walk Time 6 minutes     # of Rest Breaks 0     MPH 2.42     METS 2.86     RPE 11     Perceived Dyspnea  1     VO2 Peak 10.01     Symptoms Yes (comment)     Comments Chronic Back Pain 9/10     Resting HR 68 bpm     Resting BP 126/74     Resting Oxygen Saturation  98 %     Exercise Oxygen Saturation  during 6 min walk 93 %     Max Ex. HR 105 bpm     Max Ex. BP 160/80     2 Minute Post BP 144/78       Interval HR   1 Minute HR 88  2 Minute HR 97     3 Minute HR 97     4 Minute HR 102     5 Minute HR 104     6 Minute HR 105     2 Minute Post HR 70     Interval Heart Rate? Yes       Interval Oxygen   Interval Oxygen? Yes     Baseline Oxygen Saturation % 98 %     1 Minute Oxygen Saturation % 93 %     1 Minute Liters of Oxygen 0 L     2 Minute Oxygen Saturation % 94 %     2 Minute Liters of Oxygen 0 L     3 Minute Oxygen Saturation % 94 %     3 Minute Liters of Oxygen 0 L     4 Minute Oxygen Saturation % 94 %     4 Minute Liters of Oxygen 0 L     5 Minute Oxygen Saturation % 93 %     5 Minute Liters of Oxygen 0 L     6 Minute Oxygen Saturation % 95 %     6 Minute Liters of Oxygen 0 L     2 Minute Post Oxygen Saturation % 96 %     2 Minute Post Liters of Oxygen 0 L              Oxygen Initial Assessment:  Oxygen Initial Assessment - 07/26/22 1333       Home Oxygen   Home Oxygen Device None    Sleep Oxygen Prescription None    Home Exercise Oxygen Prescription None    Home Resting Oxygen Prescription None    Compliance with Home Oxygen Use Yes      Initial 6 min Walk   Oxygen Used None      Program Oxygen Prescription   Program Oxygen Prescription None      Intervention   Short Term Goals To learn and understand importance of monitoring SPO2 with pulse oximeter and demonstrate accurate use of the pulse oximeter.;To learn and understand importance of maintaining oxygen saturations>88%;To learn and demonstrate proper pursed lip breathing techniques or other breathing techniques. ;To learn and demonstrate proper use of respiratory medications    Long  Term Goals Verbalizes importance of monitoring SPO2 with pulse oximeter and return demonstration;Maintenance of O2 saturations>88%;Exhibits proper breathing techniques, such as pursed lip breathing or other method taught during program session;Compliance with respiratory medication;Demonstrates proper use of MDI's             Oxygen Re-Evaluation:  Oxygen Re-Evaluation     Row Name 08/13/22 1723 09/12/22 1731           Program Oxygen Prescription   Program Oxygen Prescription -- None        Home Oxygen   Home Oxygen Device -- None      Sleep Oxygen Prescription -- CPAP      Home Exercise Oxygen Prescription -- None      Home Resting Oxygen Prescription -- None      Compliance with Home Oxygen Use -- Yes        Goals/Expected Outcomes   Short Term Goals -- To learn and understand importance of monitoring SPO2 with pulse oximeter and demonstrate accurate use of the pulse oximeter.;To learn and understand importance of maintaining oxygen saturations>88%;To learn and demonstrate proper pursed lip breathing techniques or other breathing techniques. ;To learn and demonstrate proper use of respiratory  medications      Long  Term Goals -- Verbalizes importance of monitoring SPO2 with pulse oximeter and return demonstration;Maintenance of O2 saturations>88%;Exhibits proper breathing techniques, such as pursed lip breathing or other method taught during program session;Compliance with respiratory medication;Demonstrates proper use of MDI's      Comments Reviewed PLB technique with pt.  Talked about how it works and it's importance in maintaining their exercise saturations. Patient reported that she uses her CPAP as consistent as she can at home. She aslo reports that she feels comfortable using PLB when she has shortness of breath.      Goals/Expected Outcomes Short: Become more profiecient at using PLB.   Long: Become independent at using PLB. Short: Become more profiecient at using PLB. continue to try to be consistent with CPAP use at night.   Long: Become independent at using PLB.               Oxygen Discharge (Final Oxygen Re-Evaluation):  Oxygen Re-Evaluation - 09/12/22 1731       Program Oxygen Prescription   Program Oxygen Prescription None      Home Oxygen   Home Oxygen Device None    Sleep Oxygen Prescription CPAP    Home Exercise Oxygen Prescription None    Home Resting Oxygen Prescription None    Compliance with Home Oxygen Use Yes      Goals/Expected Outcomes   Short Term Goals To learn and understand importance of monitoring SPO2 with pulse oximeter and demonstrate accurate use of the pulse oximeter.;To learn and understand importance of maintaining oxygen saturations>88%;To learn and demonstrate proper pursed lip breathing techniques or other breathing techniques. ;To learn and demonstrate proper use of respiratory medications    Long  Term Goals Verbalizes importance of monitoring SPO2 with pulse oximeter and return demonstration;Maintenance of O2 saturations>88%;Exhibits proper breathing techniques, such as pursed lip breathing or other method taught during program  session;Compliance with respiratory medication;Demonstrates proper use of MDI's    Comments Patient reported that she uses her CPAP as consistent as she can at home. She aslo reports that she feels comfortable using PLB when she has shortness of breath.    Goals/Expected Outcomes Short: Become more profiecient at using PLB. continue to try to be consistent with CPAP use at night.   Long: Become independent at using PLB.             Initial Exercise Prescription:  Initial Exercise Prescription - 08/06/22 1100       Date of Initial Exercise RX and Referring Provider   Date 08/06/22    Referring Provider Dr. Sarina Ser, MD      Oxygen   Maintain Oxygen Saturation 88% or higher      Treadmill   MPH 2.4    Grade 0    Minutes 15    METs 2.84      Recumbant Bike   Level 2    RPM 50    Watts 26    Minutes 15    METs 2.86      NuStep   Level 2    SPM 80    Minutes 15    METs 2.86      Biostep-RELP   Level 2    SPM 50    Minutes 15    METs 2.86      Prescription Details   Frequency (times per week) 2    Duration Progress to 30 minutes of continuous aerobic without signs/symptoms of physical  distress      Intensity   THRR 40-80% of Max Heartrate 102-136    Ratings of Perceived Exertion 11-13    Perceived Dyspnea 0-4      Progression   Progression Continue to progress workloads to maintain intensity without signs/symptoms of physical distress.      Resistance Training   Training Prescription Yes    Weight 4 lb    Reps 10-15             Perform Capillary Blood Glucose checks as needed.  Exercise Prescription Changes:   Exercise Prescription Changes     Row Name 08/06/22 1100 08/15/22 1700 08/27/22 1300 09/10/22 1100       Response to Exercise   Blood Pressure (Admit) 126/74 138/84 136/70 124/70    Blood Pressure (Exercise) 160/80 166/86 160/82 138/64    Blood Pressure (Exit) 144/78 134/72 112/74 104/62    Heart Rate (Admit) 68 bpm 78 bpm 84  bpm 83 bpm    Heart Rate (Exercise) 105 bpm 118 bpm 114 bpm 117 bpm    Heart Rate (Exit) 70 bpm 80 bpm 93 bpm 89 bpm    Oxygen Saturation (Admit) 98 % 96 % 95 % 94 %    Oxygen Saturation (Exercise) 93 % 93 % 91 % 91 %    Oxygen Saturation (Exit) 96 % 97 % 93 % 95 %    Rating of Perceived Exertion (Exercise) 11 11 13 12     Perceived Dyspnea (Exercise) 1 -- -- --    Symptoms Chronic Back Pain 9/10 none none none    Comments Results 1st full day of exercise -- --    Duration -- Progress to 30 minutes of  aerobic without signs/symptoms of physical distress Continue with 30 min of aerobic exercise without signs/symptoms of physical distress. Continue with 30 min of aerobic exercise without signs/symptoms of physical distress.    Intensity -- THRR unchanged THRR unchanged THRR unchanged      Progression   Progression -- Continue to progress workloads to maintain intensity without signs/symptoms of physical distress. Continue to progress workloads to maintain intensity without signs/symptoms of physical distress. Continue to progress workloads to maintain intensity without signs/symptoms of physical distress.    Average METs -- 2.62 2.45 2.95      Resistance Training   Training Prescription -- Yes Yes Yes    Weight -- 4 lb 4 lb 4 lb    Reps -- 10-15 10-15 10-15      Interval Training   Interval Training -- No No No      Treadmill   MPH -- 1.9 2.1 2.4    Grade -- 0 0 0    Minutes -- 15 15 15     METs -- 2.45 2.61 2.84      Recumbant Bike   Level -- -- 2 3    Watts -- -- 22 27    Minutes -- -- 15 15    METs -- -- 2.65 2.87      NuStep   Level -- 2 2 3     Minutes -- 15 15 15     METs -- 2.8 2.3 3.1      Oxygen   Maintain Oxygen Saturation -- 88% or higher 88% or higher 88% or higher             Exercise Comments:   Exercise Comments     Row Name 08/13/22 1721  Exercise Comments First full day of exercise!  Patient was oriented to gym and equipment  including functions, settings, policies, and procedures.  Patient's individual exercise prescription and treatment plan were reviewed.  All starting workloads were established based on the results of the 6 minute walk test done at initial orientation visit.  The plan for exercise progression was also introduced and progression will be customized based on patient's performance and goals.                Exercise Goals and Review:   Exercise Goals     Row Name 08/06/22 1050             Exercise Goals   Increase Physical Activity Yes       Intervention Provide advice, education, support and counseling about physical activity/exercise needs.;Develop an individualized exercise prescription for aerobic and resistive training based on initial evaluation findings, risk stratification, comorbidities and participant's personal goals.       Expected Outcomes Short Term: Attend rehab on a regular basis to increase amount of physical activity.;Long Term: Add in home exercise to make exercise part of routine and to increase amount of physical activity.;Long Term: Exercising regularly at least 3-5 days a week.       Increase Strength and Stamina Yes       Intervention Provide advice, education, support and counseling about physical activity/exercise needs.;Develop an individualized exercise prescription for aerobic and resistive training based on initial evaluation findings, risk stratification, comorbidities and participant's personal goals.       Expected Outcomes Short Term: Increase workloads from initial exercise prescription for resistance, speed, and METs.;Short Term: Perform resistance training exercises routinely during rehab and add in resistance training at home;Long Term: Improve cardiorespiratory fitness, muscular endurance and strength as measured by increased METs and functional capacity ( )       Able to understand and use rate of perceived exertion (RPE) scale Yes       Intervention  Provide education and explanation on how to use RPE scale       Expected Outcomes Short Term: Able to use RPE daily in rehab to express subjective intensity level;Long Term:  Able to use RPE to guide intensity level when exercising independently       Able to understand and use Dyspnea scale Yes       Intervention Provide education and explanation on how to use Dyspnea scale       Expected Outcomes Short Term: Able to use Dyspnea scale daily in rehab to express subjective sense of shortness of breath during exertion;Long Term: Able to use Dyspnea scale to guide intensity level when exercising independently       Knowledge and understanding of Target Heart Rate Range (THRR) Yes       Intervention Provide education and explanation of THRR including how the numbers were predicted and where they are located for reference       Expected Outcomes Short Term: Able to state/look up THRR;Long Term: Able to use THRR to govern intensity when exercising independently;Short Term: Able to use daily as guideline for intensity in rehab       Able to check pulse independently Yes       Intervention Provide education and demonstration on how to check pulse in carotid and radial arteries.;Review the importance of being able to check your own pulse for safety during independent exercise       Expected Outcomes Short Term: Able to explain why pulse checking is  important during independent exercise;Long Term: Able to check pulse independently and accurately       Understanding of Exercise Prescription Yes       Intervention Provide education, explanation, and written materials on patient's individual exercise prescription       Expected Outcomes Short Term: Able to explain program exercise prescription;Long Term: Able to explain home exercise prescription to exercise independently                Exercise Goals Re-Evaluation :  Exercise Goals Re-Evaluation     Row Name 08/13/22 1722 08/15/22 1801 08/27/22 1315  09/10/22 1158 09/12/22 1755     Exercise Goal Re-Evaluation   Exercise Goals Review Increase Physical Activity;Able to understand and use rate of perceived exertion (RPE) scale;Knowledge and understanding of Target Heart Rate Range (THRR);Understanding of Exercise Prescription;Increase Strength and Stamina;Able to understand and use Dyspnea scale;Able to check pulse independently Increase Physical Activity;Increase Strength and Stamina;Understanding of Exercise Prescription Increase Physical Activity;Increase Strength and Stamina;Understanding of Exercise Prescription Increase Physical Activity;Increase Strength and Stamina;Understanding of Exercise Prescription Increase Physical Activity;Increase Strength and Stamina;Understanding of Exercise Prescription   Comments Reviewed RPE scale, THR and program prescription with pt today.  Pt voiced understanding and was given a copy of goals to take home. [name] did well for her first session of rehab.  She was able to do level 2 on the Nustep and 1.9 mph on the treadmill. Her RPEs are appropriate  with exercise. We will continue to monitor as she continues to progress in the program. [name] continues to do well in the program. She recently increased her treadmill speed to 2.1 mph with no incline. She also began using the recumbent bike and did well with level 2. She has continued to use 4 lb hand weights for resistance training as well. We will continue to monitor her progress in the program. [name] continues to do well in rehab. She increased her treadmill speed to 2.4 mph. She could benefit from adding an incline to her prescription. She also increased to level 3 on the recumbent bike and T4 Nustep. All with appropriate RPEs.She continues to reach her THR most sessions. Will continue to monitor. Patient reports that she is procressing with her exercise in class and continue to increase her work loads when tolerated. She has not been exercising at home but packing boxes. She has  some frustration that she can not do things at home such as lifiting and walking like she used to be able to do. Time prohibited going over home exercise guidelines today but this will be done in the near future with program staff.   Expected Outcomes Short: Use RPE daily to regulate intensity.  Long: Follow program prescription in THR. Short: Continue to exercise at initial exercise prescription Long: Increase overall MET level and stamina Short: Try to add incline to treadmill workload. Long: Continue to improve strength and stamina. Short:Add small incline to treadmill Long: Continue to increase overall MET level and stamina Short: go over home exercise guidelines with staff memeber. Long: become independent with exercise program.            Discharge Exercise Prescription (Final Exercise Prescription Changes):  Exercise Prescription Changes - 09/10/22 1100       Response to Exercise   Blood Pressure (Admit) 124/70    Blood Pressure (Exercise) 138/64    Blood Pressure (Exit) 104/62    Heart Rate (Admit) 83 bpm    Heart Rate (Exercise) 117 bpm  Heart Rate (Exit) 89 bpm    Oxygen Saturation (Admit) 94 %    Oxygen Saturation (Exercise) 91 %    Oxygen Saturation (Exit) 95 %    Rating of Perceived Exertion (Exercise) 12    Symptoms none    Duration Continue with 30 min of aerobic exercise without signs/symptoms of physical distress.    Intensity THRR unchanged      Progression   Progression Continue to progress workloads to maintain intensity without signs/symptoms of physical distress.    Average METs 2.95      Resistance Training   Training Prescription Yes    Weight 4 lb    Reps 10-15      Interval Training   Interval Training No      Treadmill   MPH 2.4    Grade 0    Minutes 15    METs 2.84      Recumbant Bike   Level 3    Watts 27    Minutes 15    METs 2.87      NuStep   Level 3    Minutes 15    METs 3.1      Oxygen   Maintain Oxygen Saturation 88% or  higher             Nutrition:  Target Goals: Understanding of nutrition guidelines, daily intake of sodium 1500mg , cholesterol 200mg , calories 30% from fat and 7% or less from saturated fats, daily to have 5 or more servings of fruits and vegetables.  Education: All About Nutrition: -Group instruction provided by verbal, written material, interactive activities, discussions, models, and posters to present general guidelines for heart healthy nutrition including fat, fiber, MyPlate, the role of sodium in heart healthy nutrition, utilization of the nutrition label, and utilization of this knowledge for meal planning. Follow up email sent as well. Written material given at graduation.   Biometrics:  Pre Biometrics - 08/06/22 1051       Pre Biometrics   Height 5\' 5"  (1.651 m)    Weight 215 lb 9.6 oz (97.8 kg)    Waist Circumference 41.5 inches    Hip Circumference 43 inches    Waist to Hip Ratio 0.97 %    BMI (Calculated) 35.88    Single Leg Stand 30 seconds              Nutrition Therapy Plan and Nutrition Goals:  Nutrition Therapy & Goals - 08/20/22 1722       Nutrition Therapy   Diet Heart healthy, low Na    Drug/Food Interactions Statins/Certain Fruits    Protein (specify units) 60-70g    Fiber 25 grams    Whole Grain Foods 3 servings    Saturated Fats 12 max. grams    Fruits and Vegetables 8 servings/day    Sodium 2 grams      Personal Nutrition Goals   Nutrition Goal ST: include 6-8 smaller meals per day including heart healthy fats, fiber, and limiting salt LT: follow MyPlate guidelines    Comments 67 y.o. F admitted to pulmonary rehab for COPD. PMHx includes GERD, hypothyroidism, CKD stg 3, HLD, tobacco use, anxiety/depression, PTSD, vit B12 deficiency, OSA. PSHx includes cholecystectomy, thyroidectomy, parathyroidectomy. Documented homelessness 09/22/21, now housed. Labs reviewed. Medications reviewed atorvastatin, furosemide, synthroid, pantoprazole, celexa.  [name] reports being unaware of any CKD stg 3 diagnosis -  discussed how that is in her past medical history and encouraged her to speak with her MD regarding this. [name] reports  she would like to get back to 6-8 meals per day and cooking more heart healthy meals as she used to eat this way and enjoyed it - she lives with someone and is not able to eat this way; she hopes to find a new living situation within 2 weeks. She reports not eating much 15-30 oz of water raisin toast or multigrain peaches (canned in it's own juice) and pineapples. L: Small plate of leftovers (usually beans) D: beans with frozen steamable broccoli. Drinks: water with coffee (2-3 10-12oz cups). Discussed heart healthy eating and CKD stg 3 MNT.      Intervention Plan   Intervention Prescribe, educate and counsel regarding individualized specific dietary modifications aiming towards targeted core components such as weight, hypertension, lipid management, diabetes, heart failure and other comorbidities.    Expected Outcomes Short Term Goal: A plan has been developed with personal nutrition goals set during dietitian appointment.;Short Term Goal: Understand basic principles of dietary content, such as calories, fat, sodium, cholesterol and nutrients.;Long Term Goal: Adherence to prescribed nutrition plan.             Nutrition Assessments:  MEDIFICTS Score Key: ?70 Need to make dietary changes  40-70 Heart Healthy Diet ? 40 Therapeutic Level Cholesterol Diet  Flowsheet Row Pulmonary Rehab from 08/06/2022 in [facility]  Picture Your Plate Total Score on Admission 72      Picture Your Plate Scores: <30 Unhealthy dietary pattern with much room for improvement. 41-50 Dietary pattern unlikely to meet recommendations for good health and room for improvement. 51-60 More healthful dietary pattern, with some room for improvement.  >60 Healthy dietary pattern, although there may be some specific behaviors that could  be improved.   Nutrition Goals Re-Evaluation:  Nutrition Goals Re-Evaluation     Row Name 09/12/22 1735             Goals   Nutrition Goal ST: include 6-8 smaller meals per day including heart healthy fats, fiber, and limiting salt LT: follow MyPlate guidelines       Comment Patient reports that she is trying to follow dietary recommendations but has some instability in her living situation and ability to always cook on a routine schedule. She does report that she does not eat katchup becasue that causes her to retain fluid.       Expected Outcome Short: continue to work on securing living situaiton so that she has more control over her routine and ability to have access to a kitchen to cook healthy meals. Long: eat a heart healthy diet and eat 6-8 small meals a day once she has more stability in living situaiton.                Nutrition Goals Discharge (Final Nutrition Goals Re-Evaluation):  Nutrition Goals Re-Evaluation - 09/12/22 1735       Goals   Nutrition Goal ST: include 6-8 smaller meals per day including heart healthy fats, fiber, and limiting salt LT: follow MyPlate guidelines    Comment Patient reports that she is trying to follow dietary recommendations but has some instability in her living situation and ability to always cook on a routine schedule. She does report that she does not eat katchup becasue that causes her to retain fluid.    Expected Outcome Short: continue to work on securing living situaiton so that she has more control over her routine and ability to have access to a kitchen to cook healthy meals. Long: eat  a heart healthy diet and eat 6-8 small meals a day once she has more stability in living situaiton.             Psychosocial: Target Goals: Acknowledge presence or absence of significant depression and/or stress, maximize coping skills, provide positive support system. Participant is able to verbalize types and ability to use techniques and  skills needed for reducing stress and depression.   Education: Stress, Anxiety, and Depression - Group verbal and visual presentation to define topics covered.  Reviews how body is impacted by stress, anxiety, and depression.  Also discusses healthy ways to reduce stress and to treat/manage anxiety and depression.  Written material given at graduation.   Education: Sleep Hygiene -Provides group verbal and written instruction about how sleep can affect your health.  Define sleep hygiene, discuss sleep cycles and impact of sleep habits. Review good sleep hygiene tips.    Initial Review & Psychosocial Screening:  Initial Psych Review & Screening - 07/26/22 1342       Initial Review   Current issues with Current Stress Concerns    Source of Stress Concerns Financial;Chronic Illness      Family Dynamics   Good Support System? Yes   daughters     Barriers   Psychosocial barriers to participate in program There are no identifiable barriers or psychosocial needs.;The patient should benefit from training in stress management and relaxation.      Screening Interventions   Interventions Encouraged to exercise;Provide feedback about the scores to participant    Expected Outcomes Short Term goal: Utilizing psychosocial counselor, staff and physician to assist with identification of specific Stressors or current issues interfering with healing process. Setting desired goal for each stressor or current issue identified.;Long Term Goal: Stressors or current issues are controlled or eliminated.;Short Term goal: Identification and review with participant of any Quality of Life or Depression concerns found by scoring the questionnaire.;Long Term goal: The participant improves quality of Life and PHQ9 Scores as seen by post scores and/or verbalization of changes             Quality of Life Scores:  Scores of 19 and below usually indicate a poorer quality of life in these areas.  A difference of  2-3  points is a clinically meaningful difference.  A difference of 2-3 points in the total score of the Quality of Life Index has been associated with significant improvement in overall quality of life, self-image, physical symptoms, and general health in studies assessing change in quality of life.  PHQ-9: Review Flowsheet  More data exists      08/06/2022 03/26/2022 02/01/2022 10/18/2021 08/10/2021  Depression screen PHQ 2/9  Decreased Interest 0 0 0 0 0  Down, Depressed, Hopeless 0 0 0 0 0  PHQ - 2 Score 0 0 0 0 0  Altered sleeping 1 1 3  - 1  Tired, decreased energy 1 1 0 - 1  Change in appetite 0 0 0 - 0  Feeling bad or failure about yourself  0 0 0 - 0  Trouble concentrating 0 0 0 - 0  Moving slowly or fidgety/restless 0 0 0 - 0  Suicidal thoughts 0 0 0 - 0  PHQ-9 Score 2 2 3  - 2  Difficult doing work/chores Not difficult at all - Not difficult at all - Not difficult at all   Interpretation of Total Score  Total Score Depression Severity:  1-4 = Minimal depression, 5-9 = Mild depression, 10-14 = Moderate depression,  15-19 = Moderately severe depression, 20-27 = Severe depression   Psychosocial Evaluation and Intervention:  Psychosocial Evaluation - 07/26/22 1401       Psychosocial Evaluation & Interventions   Interventions Encouraged to exercise with the program and follow exercise prescription;Stress management education;Relaxation education    Comments [name] is coming to Pulmonary Rehab with dypsnea and mild COPD. She has noticed that her breathing and stamina have gotten a lot worse in the last year, where it is hard for her to walk even short distances where before she could walk with no problem. She had her thyroid surgery years ago and states she has gained a lot of weight after that she can't seem to shake. She is working really hard on quitting smoking entirely. She tried the patches and had to stop because of their side effects. She has started changing her lifestyle habits,  like drinking more water or going outside on her porch instead of smoking. She is down to very sporadically smoking. She mentioned that some triggers are her car (since is smells like smoke), when she is on the phone a lot during her work calls, and when her what used to be very big temper flares up. As far as her temper, she notes that she has made a lot of positive changes over the years and she doesn't let as many things bother her as she used to. She really likes her job at [employer] where she works from home most days. However, she notes that she sits a a lot more than she used to. She is looking forward to becoming more active, but hesitant on what she can actually do given her worsening health concerns.    Expected Outcomes Short: attend pulmonary rehab for education and exercise. Long: develop and maintain positive self care habits.    Continue Psychosocial Services  Follow up required by staff             Psychosocial Re-Evaluation:  Psychosocial Re-Evaluation     Row Name 09/12/22 1750             Psychosocial Re-Evaluation   Current issues with Current Stress Concerns       Comments no new mental health concerns but does continue to have stress related to current living situation and her daughter's health concerns.       Expected Outcomes Short: continue to work to a more stable living situation. Long: Continue to maintain good mental health habits.       Interventions Encouraged to attend Pulmonary Rehabilitation for the exercise       Continue Psychosocial Services  Follow up required by staff                Psychosocial Discharge (Final Psychosocial Re-Evaluation):  Psychosocial Re-Evaluation - 09/12/22 1750       Psychosocial Re-Evaluation   Current issues with Current Stress Concerns    Comments no new mental health concerns but does continue to have stress related to current living situation and her daughter's health concerns.    Expected Outcomes Short:  continue to work to a more stable living situation. Long: Continue to maintain good mental health habits.    Interventions Encouraged to attend Pulmonary Rehabilitation for the exercise    Continue Psychosocial Services  Follow up required by staff             Education: Education Goals: Education classes will be provided on a weekly basis, covering required topics. Participant will state  understanding/return demonstration of topics presented.  Learning Barriers/Preferences:  Learning Barriers/Preferences - 07/26/22 1342       Learning Barriers/Preferences   Learning Barriers None    Learning Preferences None             General Pulmonary Education Topics:  Infection Prevention: - Provides verbal and written material to individual with discussion of infection control including proper hand washing and proper equipment cleaning during exercise session. Flowsheet Row Pulmonary Rehab from 09/12/2022 in [facility] Cardiac and Pulmonary Rehab  Date 08/06/22  Educator NT  Instruction Review Code 1- Verbalizes Understanding       Falls Prevention: - Provides verbal and written material to individual with discussion of falls prevention and safety. Flowsheet Row Pulmonary Rehab from 09/12/2022 in [facility] Cardiac and Pulmonary Rehab  Date 08/06/22  Educator NT  Instruction Review Code 1- Verbalizes Understanding       Chronic Lung Disease Review: - Group verbal instruction with posters, models, PowerPoint presentations and videos,  to review new updates, new respiratory medications, new advancements in procedures and treatments. Providing information on websites and "800" numbers for continued self-education. Includes information about supplement oxygen, available portable oxygen systems, continuous and intermittent flow rates, oxygen safety, concentrators, and Medicare reimbursement for oxygen. Explanation of Pulmonary Drugs, including class, frequency, complications, importance of  spacers, rinsing mouth after steroid MDI's, and proper cleaning methods for nebulizers. Review of basic lung anatomy and physiology related to function, structure, and complications of lung disease. Review of risk factors. Discussion about methods for diagnosing sleep apnea and types of masks and machines for OSA. Includes a review of the use of types of environmental controls: home humidity, furnaces, filters, dust mite/pet prevention, HEPA vacuums. Discussion about weather changes, air quality and the benefits of nasal washing. Instruction on Warning signs, infection symptoms, calling MD promptly, preventive modes, and value of vaccinations. Review of effective airway clearance, coughing and/or vibration techniques. Emphasizing that all should Create an Action Plan. Written material given at graduation.   AED/CPR: - Group verbal and written instruction with the use of models to demonstrate the basic use of the AED with the basic ABC's of resuscitation.    Anatomy and Cardiac Procedures: - Group verbal and visual presentation and models provide information about basic cardiac anatomy and function. Reviews the testing methods done to diagnose heart disease and the outcomes of the test results. Describes the treatment choices: Medical Management, Angioplasty, or Coronary Bypass Surgery for treating various heart conditions including Myocardial Infarction, Angina, Valve Disease, and Cardiac Arrhythmias.  Written material given at graduation.   Medication Safety: - Group verbal and visual instruction to review commonly prescribed medications for heart and lung disease. Reviews the medication, class of the drug, and side effects. Includes the steps to properly store meds and maintain the prescription regimen.  Written material given at graduation.   Other: -Provides group and verbal instruction on various topics (see comments)   Knowledge Questionnaire Score:  Knowledge Questionnaire Score -  08/06/22 1031       Knowledge Questionnaire Score   Pre Score 14/18              Core Components/Risk Factors/Patient Goals at Admission:  Personal Goals and Risk Factors at Admission - 08/06/22 1035       Core Components/Risk Factors/Patient Goals on Admission    Weight Management Yes;Weight Gain    Intervention Weight Management: Develop a combined nutrition and exercise program designed to reach desired caloric intake, while maintaining appropriate  intake of nutrient and fiber, sodium and fats, and appropriate energy expenditure required for the weight goal.;Weight Management: Provide education and appropriate resources to help participant work on and attain dietary goals.;Weight Management/Obesity: Establish reasonable short term and long term weight goals.    Admit Weight 215 lb 9.6 oz (97.8 kg)    Goal Weight: Short Term 205 lb (93 kg)    Goal Weight: Long Term 175 lb (79.4 kg)    Expected Outcomes Short Term: Continue to assess and modify interventions until short term weight is achieved;Long Term: Adherence to nutrition and physical activity/exercise program aimed toward attainment of established weight goal;Weight Loss: Understanding of general recommendations for a balanced deficit meal plan, which promotes 1-2 lb weight loss per week and includes a negative energy balance of [phone] kcal/d;Understanding recommendations for meals to include 15-35% energy as protein, 25-35% energy from fat, 35-60% energy from carbohydrates, less than 200mg  of dietary cholesterol, 20-35 gm of total fiber daily;Understanding of distribution of calorie intake throughout the day with the consumption of 4-5 meals/snacks    Tobacco Cessation Yes    Number of packs per day --   inconsistent   Intervention Assist the participant in steps to quit. Provide individualized education and counseling about committing to Tobacco Cessation, relapse prevention, and pharmacological support that can be provided by  physician.;Education officer, environmental, assist with locating and accessing local/national Quit Smoking programs, and support quit date choice.    Expected Outcomes Short Term: Will demonstrate readiness to quit, by selecting a quit date.;Short Term: Will quit all tobacco product use, adhering to prevention of relapse plan.;Long Term: Complete abstinence from all tobacco products for at least 12 months from quit date.    Improve shortness of breath with ADL's Yes    Intervention Provide education, individualized exercise plan and daily activity instruction to help decrease symptoms of SOB with activities of daily living.    Expected Outcomes Short Term: Improve cardiorespiratory fitness to achieve a reduction of symptoms when performing ADLs;Long Term: Be able to perform more ADLs without symptoms or delay the onset of symptoms    Hypertension Yes    Intervention Provide education on lifestyle modifcations including regular physical activity/exercise, weight management, moderate sodium restriction and increased consumption of fresh fruit, vegetables, and low fat dairy, alcohol moderation, and smoking cessation.;Monitor prescription use compliance.    Expected Outcomes Short Term: Continued assessment and intervention until BP is < 140/65mm HG in hypertensive participants. < 130/5mm HG in hypertensive participants with diabetes, heart failure or chronic kidney disease.;Long Term: Maintenance of blood pressure at goal levels.    Lipids Yes    Intervention Provide education and support for participant on nutrition & aerobic/resistive exercise along with prescribed medications to achieve LDL 70mg , HDL >40mg .    Expected Outcomes Short Term: Participant states understanding of desired cholesterol values and is compliant with medications prescribed. Participant is following exercise prescription and nutrition guidelines.;Long Term: Cholesterol controlled with medications as prescribed, with individualized  exercise RX and with personalized nutrition plan. Value goals: LDL < 70mg , HDL > 40 mg.             Education:Diabetes - Individual verbal and written instruction to review signs/symptoms of diabetes, desired ranges of glucose level fasting, after meals and with exercise. Acknowledge that pre and post exercise glucose checks will be done for 3 sessions at entry of program.   Know Your Numbers and Heart Failure: - Group verbal and visual instruction to discuss disease risk factors for  cardiac and pulmonary disease and treatment options.  Reviews associated critical values for Overweight/Obesity, Hypertension, Cholesterol, and Diabetes.  Discusses basics of heart failure: signs/symptoms and treatments.  Introduces Heart Failure Zone chart for action plan for heart failure.  Written material given at graduation. Flowsheet Row Pulmonary Rehab from 09/12/2022 in [facility] Cardiac and Pulmonary Rehab  Education need identified 08/06/22  Date 09/12/22  Educator MS  Instruction Review Code 1- Verbalizes Understanding       Core Components/Risk Factors/Patient Goals Review:   Goals and Risk Factor Review     Row Name 09/12/22 1745             Core Components/Risk Factors/Patient Goals Review   Personal Goals Review Weight Management/Obesity;Tobacco Cessation       Review Patient reports that her weight has gone up. She is continually dealing with fluid retention. She does not have a scale at home and she was encouraged to buy one to monitor weight. She continues to smoke 2-3 cigarettes a day and does not currently have a quit date. she does take all medications as prescribed.       Expected Outcomes Short: buy and scale and begin to monitor weight at home on a daily basis. Long: work on quitting smoking.                Core Components/Risk Factors/Patient Goals at Discharge (Final Review):   Goals and Risk Factor Review - 09/12/22 1745       Core Components/Risk Factors/Patient Goals  Review   Personal Goals Review Weight Management/Obesity;Tobacco Cessation    Review Patient reports that her weight has gone up. She is continually dealing with fluid retention. She does not have a scale at home and she was encouraged to buy one to monitor weight. She continues to smoke 2-3 cigarettes a day and does not currently have a quit date. she does take all medications as prescribed.    Expected Outcomes Short: buy and scale and begin to monitor weight at home on a daily basis. Long: work on quitting smoking.             ITP Comments:  ITP Comments     Row Name 07/26/22 1350 08/06/22 1028 08/13/22 1721 08/22/22 1402 09/19/22 0939   ITP Comments Initial phone call completed. Diagnosis can be found in [facility] 12/1. EP Orientation scheduled for Monday 4/1 at 9am.       [name] is a current tobacco user. Intervention for tobacco cessation was provided at the initial medical review. She was asked about readiness to quit and reported she is ready to quit . Patient was advised and educated about tobacco cessation using combination therapy, tobacco cessation classes, quit line, and quit smoking apps. Patient demonstrated understanding of this material. Staff will continue to provide encouragement and follow up with the patient throughout the program. Completed and gym orientation. Initial ITP created and sent for review to Dr. Vida Rigger, Medical Director. First full day of exercise!  Patient was oriented to gym and equipment including functions, settings, policies, and procedures.  Patient's individual exercise prescription and treatment plan were reviewed.  All starting workloads were established based on the results of the 6 minute walk test done at initial orientation visit.  The plan for exercise progression was also introduced and progression will be customized based on patient's performance and goals. 30 day review completed. ITP sent to Dr. Jinny Sanders, Medical Director of  Pulmonary  Rehab. Continue with ITP unless changes  are made by physician. Pt still new to program. 30 Day review completed. Medical Director ITP review done, changes made as directed, and signed approval by Medical Director.            Comments:

## 2022-09-20 ENCOUNTER — Encounter: Payer: BC Managed Care – PPO | Admitting: *Deleted

## 2022-09-20 DIAGNOSIS — J449 Chronic obstructive pulmonary disease, unspecified: Secondary | ICD-10-CM

## 2022-09-20 DIAGNOSIS — R0602 Shortness of breath: Secondary | ICD-10-CM

## 2022-09-20 NOTE — Progress Notes (Signed)
Daily Session Note  Patient Details  Name: [name] MRN: [id number] Date of Birth: [birth year] Referring Provider:   Flowsheet Row Pulmonary Rehab from 08/06/2022 in [facility] Cardiac and Pulmonary Rehab  Referring Provider Dr. Sarina Ser, MD       Encounter Date: 09/20/2022  Check In:  Session Check In - 09/20/22 1713       Check-In   Supervising physician immediately available to respond to emergencies See telemetry face sheet for immediately available ER MD    Location [name]    Staff Present [name], RN BSN;Joseph [place], RCP,RRT,BSRT;Kara [place], MS, ACSM CEP, Exercise Physiologist    Virtual Visit No    Medication changes reported     No    Fall or balance concerns reported    No    Tobacco Cessation No Change    Current number of cigarettes/nicotine per day     4    Warm-up and Cool-down Performed on first and last piece of equipment    Resistance Training Performed Yes    VAD Patient? No    PAD/SET Patient? No      Pain Assessment   Currently in Pain? No/denies                Social History   Tobacco Use  Smoking Status Every Day   Packs/day: 1.50   Years: 39.00   Additional pack years: 0.00   Total pack years: 58.50   Types: Cigarettes  Smokeless Tobacco Never  Tobacco Comments   2 cigarettes a day- 08/06/2022 khj    Goals Met:  Independence with exercise equipment Exercise tolerated well No report of concerns or symptoms today Strength training completed today  Goals Unmet:  Not Applicable  Comments: Pt able to follow exercise prescription today without complaint.  Will continue to monitor for progression.    Dr. Bethann Punches is Medical Director for [facility] Cardiac Rehabilitation.  Dr. Vida Rigger is Medical Director for [facility] Pulmonary Rehabilitation.

## 2022-09-20 NOTE — Telephone Encounter (Signed)
I have received a response from [facility] with [employer] ". They are not eligible to get a restart because they are going to have to start the process entirely over because they're secondary insurance is Medicare. I'm sorry I guess the patient is confused. That is what needs to happen. They will have to start the entire process over and get a new face-to-face note and they will have to have a new sleep study and it will have to be an in lab sleep study. that's what Medicare requires."

## 2022-09-21 NOTE — Telephone Encounter (Signed)
Order has been placed. Nothing further needed. 

## 2022-09-21 NOTE — Telephone Encounter (Signed)
Okay to order in lab sleep study order as split-night.

## 2022-09-21 NOTE — Telephone Encounter (Signed)
I spoke with the patient. She is ok with doing the in lab sleep study.  Dr. Jayme Cloud, ok to order in lab sleep study?

## 2022-09-24 ENCOUNTER — Encounter: Payer: Self-pay | Admitting: Family Medicine

## 2022-09-24 ENCOUNTER — Ambulatory Visit (INDEPENDENT_AMBULATORY_CARE_PROVIDER_SITE_OTHER): Payer: BC Managed Care – PPO | Admitting: Family Medicine

## 2022-09-24 ENCOUNTER — Encounter: Payer: Medicare Other | Admitting: *Deleted

## 2022-09-24 VITALS — BP 114/74 | HR 74 | Ht 65.0 in | Wt 215.0 lb

## 2022-09-24 DIAGNOSIS — J449 Chronic obstructive pulmonary disease, unspecified: Secondary | ICD-10-CM

## 2022-09-24 DIAGNOSIS — F331 Major depressive disorder, recurrent, moderate: Secondary | ICD-10-CM

## 2022-09-24 DIAGNOSIS — I480 Paroxysmal atrial fibrillation: Secondary | ICD-10-CM | POA: Diagnosis not present

## 2022-09-24 DIAGNOSIS — E039 Hypothyroidism, unspecified: Secondary | ICD-10-CM | POA: Diagnosis not present

## 2022-09-24 DIAGNOSIS — R0602 Shortness of breath: Secondary | ICD-10-CM

## 2022-09-24 DIAGNOSIS — N1831 Chronic kidney disease, stage 3a: Secondary | ICD-10-CM

## 2022-09-24 DIAGNOSIS — E782 Mixed hyperlipidemia: Secondary | ICD-10-CM

## 2022-09-24 MED ORDER — CITALOPRAM HYDROBROMIDE 40 MG PO TABS: 60.0000 mg | ORAL_TABLET | Freq: Every day | ORAL | 1 refills | Status: DC

## 2022-09-24 MED ORDER — ATORVASTATIN CALCIUM 40 MG PO TABS: 40.0000 mg | ORAL_TABLET | Freq: Every day | ORAL | 1 refills | Status: DC

## 2022-09-24 MED ORDER — FUROSEMIDE 20 MG PO TABS: 20.0000 mg | ORAL_TABLET | ORAL | 3 refills | Status: DC

## 2022-09-24 MED ORDER — ALBUTEROL SULFATE HFA 108 (90 BASE) MCG/ACT IN AERS: 2.0000 | INHALATION_SPRAY | Freq: Four times a day (QID) | RESPIRATORY_TRACT | 2 refills | Status: DC | PRN

## 2022-09-24 MED ORDER — PANTOPRAZOLE SODIUM 20 MG PO TBEC: 20.0000 mg | DELAYED_RELEASE_TABLET | Freq: Every day | ORAL | 3 refills | Status: DC

## 2022-09-24 NOTE — Progress Notes (Signed)
Daily Session Note  Patient Details  Name: [name] MRN: [id number] Date of Birth: 04-Sep-1955 Referring Provider:   Flowsheet Row Pulmonary Rehab from 08/06/2022 in [facility] Cardiac and Pulmonary Rehab  Referring Provider Dr. Sarina Ser, MD       Encounter Date: 09/24/2022  Check In:  Session Check In - 09/24/22 1714       Check-In   Supervising physician immediately available to respond to emergencies See telemetry face sheet for immediately available ER MD    Location [name]    Staff Present [name], RN BSN;Joseph [place], RCP,RRT,BSRT;Noah [place], [place], Exercise Physiologist    Virtual Visit No    Medication changes reported     No    Fall or balance concerns reported    No    Tobacco Cessation No Change    Current number of cigarettes/nicotine per day     4    Warm-up and Cool-down Performed on first and last piece of equipment    Resistance Training Performed Yes    VAD Patient? No    PAD/SET Patient? No      Pain Assessment   Currently in Pain? No/denies                Social History   Tobacco Use  Smoking Status Every Day   Packs/day: 1.50   Years: 39.00   Additional pack years: 0.00   Total pack years: 58.50   Types: Cigarettes  Smokeless Tobacco Never  Tobacco Comments   2 cigarettes a day- 08/06/2022 khj    Goals Met:  Independence with exercise equipment Exercise tolerated well No report of concerns or symptoms today Strength training completed today  Goals Unmet:  Not Applicable  Comments: Pt able to follow exercise prescription today without complaint.  Will continue to monitor for progression.    Dr. Bethann Punches is Medical Director for [facility] Cardiac Rehabilitation.  Dr. Vida Rigger is Medical Director for [facility] Pulmonary Rehabilitation.

## 2022-09-24 NOTE — Assessment & Plan Note (Signed)
Under good control on current regimen. Continue current regimen. Continue to monitor. Call with any concerns. Refills given.   

## 2022-09-24 NOTE — Assessment & Plan Note (Signed)
Under good control on current regimen. Continue current regimen. Continue to monitor. Call with any concerns. Refills given. Labs drawn today.   

## 2022-09-24 NOTE — Progress Notes (Signed)
BP 114/74   Pulse 74   Ht 5\' 5"  (1.651 m)   Wt 215 lb (97.5 kg)   SpO2 96%   BMI 35.78 kg/m    Subjective:    [name], female    DOB: June 30, 1955, 67 y.o.   MRN: [id number]  HPI: [name] is a 67 y.o. female  Chief Complaint  Patient presents with   Gastroesophageal Reflux   Hypothyroidism   Hyperlipidemia   Chronic Kidney Disease   HYPOTHYROIDISM Thyroid control status:controlled Satisfied with current treatment? yes Medication side effects: no Medication compliance: excellent compliance Recent dose adjustment:no Fatigue: yes Cold intolerance: no Heat intolerance: no Weight gain: no Weight loss: no Constipation: no Diarrhea/loose stools: no Palpitations: no Lower extremity edema: no Anxiety/depressed mood: no  HYPERLIPIDEMIA Hyperlipidemia status: excellent compliance Satisfied with current treatment?   yes Side effects:  no Medication compliance: excellent compliance Past cholesterol meds: atorvastatin Supplements: none Aspirin:  no The 10-year ASCVD risk score (Arnett DK, et al., 2019) is: 11.1%   Values used to calculate the score:     Age: 28 years     Sex: Female     Is Non-Hispanic African American: No     Diabetic: No     Tobacco smoker: Yes     Systolic Blood Pressure: 114 mmHg     Is BP treated: Yes     HDL Cholesterol: 55 mg/dL     Total Cholesterol: 180 mg/dL Chest pain:  no Coronary artery disease:  no  GERD GERD control status: controlled Satisfied with current treatment? yes Medication side effects: no  Medication compliance: excellent Dysphagia: no Odynophagia:  no Hematemesis: no Blood in stool: no  DEPRESSION Mood status: controlled Satisfied with current treatment?: yes Symptom severity: moderate  Duration of current treatment : chronic Side effects: no Medication compliance: excellent compliance Psychotherapy/counseling: no  Previous psychiatric medications: celexa Depressed mood:  yes Anxious mood: no Anhedonia: no Significant weight loss or gain: no Insomnia: no  Fatigue: yes Feelings of worthlessness or guilt: no Impaired concentration/indecisiveness: no Suicidal ideations: no Hopelessness: no Crying spells: no    09/24/2022   10:19 AM 08/06/2022   10:29 AM 03/26/2022   10:19 AM 02/01/2022    8:44 AM 10/18/2021   11:08 AM  Depression screen PHQ 2/9  Decreased Interest 1 0 0 0 0  Down, Depressed, Hopeless 1 0 0 0 0  PHQ - 2 Score 2 0 0 0 0  Altered sleeping 2 1 1 3    Tired, decreased energy 2 1 1  0   Change in appetite 1 0 0 0   Feeling bad or failure about yourself  0 0 0 0   Trouble concentrating 0 0 0 0   Moving slowly or fidgety/restless 0 0 0 0   Suicidal thoughts 0 0 0 0   PHQ-9 Score 7 2 2 3    Difficult doing work/chores  Not difficult at all  Not difficult at all      Relevant past medical, surgical, family and social history reviewed and updated as indicated. Interim medical history since our last visit reviewed. Allergies and medications reviewed and updated.  Review of Systems  Constitutional: Negative.   Respiratory: Negative.    Cardiovascular: Negative.   Gastrointestinal: Negative.   Musculoskeletal: Negative.   Neurological: Negative.   Psychiatric/Behavioral: Negative.      Per HPI unless specifically indicated above     Objective:    BP 114/74   Pulse  74   Ht 5\' 5"  (1.651 m)   Wt 215 lb (97.5 kg)   SpO2 96%   BMI 35.78 kg/m   Wt Readings from Last 3 Encounters:  09/24/22 215 lb (97.5 kg)  08/06/22 215 lb 9.6 oz (97.8 kg)  08/06/22 215 lb 9.6 oz (97.8 kg)    Physical Exam Vitals and nursing note reviewed.  Constitutional:      General: She is not in acute distress.    Appearance: Normal appearance. She is not ill-appearing, toxic-appearing or diaphoretic.  HENT:     Head: Normocephalic and atraumatic.     Right Ear: External ear normal.     Left Ear: External ear normal.     Nose: Nose normal.      Mouth/Throat:     Mouth: Mucous membranes are moist.     Pharynx: Oropharynx is clear.  Eyes:     General: No scleral icterus.       Right eye: No discharge.        Left eye: No discharge.     Extraocular Movements: Extraocular movements intact.     Conjunctiva/sclera: Conjunctivae normal.     Pupils: Pupils are equal, round, and reactive to light.  Cardiovascular:     Rate and Rhythm: Normal rate and regular rhythm.     Pulses: Normal pulses.     Heart sounds: Normal heart sounds. No murmur heard.    No friction rub. No gallop.  Pulmonary:     Effort: Pulmonary effort is normal. No respiratory distress.     Breath sounds: Normal breath sounds. No stridor. No wheezing, rhonchi or rales.  Chest:     Chest wall: No tenderness.  Musculoskeletal:        General: Normal range of motion.     Cervical back: Normal range of motion and neck supple.  Skin:    General: Skin is warm and dry.     Capillary Refill: Capillary refill takes less than 2 seconds.     Coloration: Skin is not jaundiced or pale.     Findings: No bruising, erythema, lesion or rash.  Neurological:     General: No focal deficit present.     Mental Status: She is alert and oriented to person, place, and time. Mental status is at baseline.  Psychiatric:        Mood and Affect: Mood normal.        Behavior: Behavior normal.        Thought Content: Thought content normal.        Judgment: Judgment normal.     Results for orders placed or performed in visit on 04/06/22  Nitric oxide  Result Value Ref Range   Nitric Oxide 29       Assessment & Plan:   Problem List Items Addressed This Visit       Cardiovascular and Mediastinum   Atrial fibrillation (HCC) - Primary    Continue to follow with cardiology. Stable. Continue to monitor. Call with any concerns.       Relevant Medications   atorvastatin (LIPITOR) 40 MG tablet   furosemide (LASIX) 20 MG tablet   Other Relevant Orders   CBC with  Differential/Platelet   Comprehensive metabolic panel     Respiratory   COPD (chronic obstructive pulmonary disease) (HCC)    Under good control on current regimen. Continue current regimen. Continue to monitor. Call with any concerns. Refills given.        Relevant Medications  albuterol (VENTOLIN HFA) 108 (90 Base) MCG/ACT inhaler   Other Relevant Orders   CBC with Differential/Platelet   Comprehensive metabolic panel     Endocrine   Hypothyroidism    Rechecking labs today. Await results. Treat as needed.       Relevant Orders   CBC with Differential/Platelet   Comprehensive metabolic panel   TSH     Genitourinary   CKD (chronic kidney disease) stage 3, GFR 30-59 ml/min (HCC)   Relevant Orders   CBC with Differential/Platelet   Comprehensive metabolic panel     Other   Hyperlipidemia    Under good control on current regimen. Continue current regimen. Continue to monitor. Call with any concerns. Refills given. Labs drawn today.        Relevant Medications   atorvastatin (LIPITOR) 40 MG tablet   furosemide (LASIX) 20 MG tablet   Other Relevant Orders   CBC with Differential/Platelet   Comprehensive metabolic panel   Lipid Panel w/o Chol/HDL Ratio   Depression, major, recurrent, moderate (HCC)    Under good control on current regimen. Continue current regimen. Continue to monitor. Call with any concerns. Refills given.        Relevant Medications   citalopram (CELEXA) 40 MG tablet   Other Relevant Orders   CBC with Differential/Platelet   Comprehensive metabolic panel     Follow up plan: Return in about 6 months (around 03/27/2023) for physical.

## 2022-09-24 NOTE — Assessment & Plan Note (Signed)
Continue to follow with cardiology. Stable. Continue to monitor. Call with any concerns.  

## 2022-09-24 NOTE — Assessment & Plan Note (Signed)
Rechecking labs today. Await results. Treat as needed.  °

## 2022-09-25 ENCOUNTER — Other Ambulatory Visit: Payer: Self-pay | Admitting: Family Medicine

## 2022-09-25 LAB — COMPREHENSIVE METABOLIC PANEL
ALT: 12 IU/L (ref 0–32)
AST: 17 IU/L (ref 0–40)
Albumin/Globulin Ratio: 1.8 (ref 1.2–2.2)
Albumin: 4.2 g/dL (ref 3.9–4.9)
Alkaline Phosphatase: 95 IU/L (ref 44–121)
BUN/Creatinine Ratio: 10 — ABNORMAL LOW (ref 12–28)
BUN: 10 mg/dL (ref 8–27)
Bilirubin Total: 0.3 mg/dL (ref 0.0–1.2)
CO2: 24 mmol/L (ref 20–29)
Calcium: 9.1 mg/dL (ref 8.7–10.3)
Chloride: 103 mmol/L (ref 96–106)
Creatinine, Ser: 1.02 mg/dL — ABNORMAL HIGH (ref 0.57–1.00)
Globulin, Total: 2.4 g/dL (ref 1.5–4.5)
Glucose: 107 mg/dL — ABNORMAL HIGH (ref 70–99)
Potassium: 4.3 mmol/L (ref 3.5–5.2)
Sodium: 142 mmol/L (ref 134–144)
Total Protein: 6.6 g/dL (ref 6.0–8.5)
eGFR: 61 mL/min/{1.73_m2} (ref >59)

## 2022-09-25 LAB — CBC WITH DIFFERENTIAL/PLATELET
Basophils Absolute: 0 10*3/uL (ref 0.0–0.2)
Basos: 1 %
EOS (ABSOLUTE): 0.1 10*3/uL (ref 0.0–0.4)
Eos: 1 %
Hematocrit: 45.3 % (ref 34.0–46.6)
Hemoglobin: 14.9 g/dL (ref 11.1–15.9)
Immature Grans (Abs): 0 10*3/uL (ref 0.0–0.1)
Immature Granulocytes: 0 %
Lymphocytes Absolute: 2.2 10*3/uL (ref 0.7–3.1)
Lymphs: 27 %
MCH: 31 pg (ref 26.6–33.0)
MCHC: 32.9 g/dL (ref 31.5–35.7)
MCV: 94 fL (ref 79–97)
Monocytes Absolute: 0.6 10*3/uL (ref 0.1–0.9)
Monocytes: 7 %
Neutrophils Absolute: 5.4 10*3/uL (ref 1.4–7.0)
Neutrophils: 64 %
Platelets: 252 10*3/uL (ref 150–450)
RBC: 4.81 x10E6/uL (ref 3.77–5.28)
RDW: 11.8 % (ref 11.7–15.4)
WBC: 8.3 10*3/uL (ref 3.4–10.8)

## 2022-09-25 LAB — LIPID PANEL W/O CHOL/HDL RATIO
Cholesterol, Total: 236 mg/dL — ABNORMAL HIGH (ref 100–199)
HDL: 45 mg/dL (ref >39)
LDL Chol Calc (NIH): 164 mg/dL — ABNORMAL HIGH (ref 0–99)
Triglycerides: 149 mg/dL (ref 0–149)
VLDL Cholesterol Cal: 27 mg/dL (ref 5–40)

## 2022-09-25 LAB — TSH: TSH: 4.45 u[IU]/mL (ref 0.450–4.500)

## 2022-09-25 MED ORDER — LEVOTHYROXINE SODIUM 125 MCG PO TABS: 125.0000 ug | ORAL_TABLET | Freq: Every day | ORAL | 3 refills | Status: DC

## 2022-09-27 ENCOUNTER — Encounter: Payer: BC Managed Care – PPO | Admitting: *Deleted

## 2022-09-27 DIAGNOSIS — J449 Chronic obstructive pulmonary disease, unspecified: Secondary | ICD-10-CM

## 2022-09-27 DIAGNOSIS — R0602 Shortness of breath: Secondary | ICD-10-CM

## 2022-09-27 NOTE — Progress Notes (Signed)
Daily Session Note  Patient Details  Name: [name] MRN: [id number] Date of Birth: Oct 11, 1955 Referring Provider:   Flowsheet Row Pulmonary Rehab from 08/06/2022 in [facility] Cardiac and Pulmonary Rehab  Referring Provider Dr. Sarina Ser, MD       Encounter Date: 09/27/2022  Check In:  Session Check In - 09/27/22 1718       Check-In   Supervising physician immediately available to respond to emergencies See telemetry face sheet for immediately available ER MD    Location [name]    Staff Present [name], RN BSN;Joseph [place], RCP,RRT,BSRT;Noah [place], [place], Exercise Physiologist    Virtual Visit No    Medication changes reported     No    Fall or balance concerns reported    No    Tobacco Cessation No Change    Current number of cigarettes/nicotine per day     4    Warm-up and Cool-down Performed on first and last piece of equipment    Resistance Training Performed Yes    VAD Patient? No    PAD/SET Patient? No      Pain Assessment   Currently in Pain? No/denies                Social History   Tobacco Use  Smoking Status Every Day   Packs/day: 1.50   Years: 39.00   Additional pack years: 0.00   Total pack years: 58.50   Types: Cigarettes  Smokeless Tobacco Never  Tobacco Comments   2 cigarettes a day- 08/06/2022 khj    Goals Met:  Independence with exercise equipment Exercise tolerated well No report of concerns or symptoms today Strength training completed today  Goals Unmet:  Not Applicable  Comments: Pt able to follow exercise prescription today without complaint.  Will continue to monitor for progression.    Dr. Bethann Punches is Medical Director for [facility] Cardiac Rehabilitation.  Dr. Vida Rigger is Medical Director for [facility] Pulmonary Rehabilitation.

## 2022-10-04 ENCOUNTER — Encounter: Payer: BC Managed Care – PPO | Admitting: *Deleted

## 2022-10-04 DIAGNOSIS — R0602 Shortness of breath: Secondary | ICD-10-CM

## 2022-10-04 DIAGNOSIS — J449 Chronic obstructive pulmonary disease, unspecified: Secondary | ICD-10-CM

## 2022-10-04 NOTE — Progress Notes (Signed)
Daily Session Note  Patient Details  Name: [name] MRN: [id number] Date of Birth: 12-Jun-1955 Referring Provider:   Flowsheet Row Pulmonary Rehab from 08/06/2022 in [facility] Cardiac and Pulmonary Rehab  Referring Provider Dr. Sarina Ser, MD       Encounter Date: 10/04/2022  Check In:  Session Check In - 10/04/22 1717       Check-In   Supervising physician immediately available to respond to emergencies See telemetry face sheet for immediately available ER MD    Location [name]    Staff Present [name], RN BSN;Joseph [place], RCP,RRT,BSRT;Noah [place], [place], Exercise Physiologist    Virtual Visit No    Medication changes reported     No    Fall or balance concerns reported    No    Tobacco Cessation No Change    Current number of cigarettes/nicotine per day     4    Warm-up and Cool-down Performed on first and last piece of equipment    Resistance Training Performed Yes    VAD Patient? No    PAD/SET Patient? No      Pain Assessment   Currently in Pain? No/denies                Social History   Tobacco Use  Smoking Status Every Day   Packs/day: 1.50   Years: 39.00   Additional pack years: 0.00   Total pack years: 58.50   Types: Cigarettes  Smokeless Tobacco Never  Tobacco Comments   2 cigarettes a day- 08/06/2022 khj    Goals Met:  Independence with exercise equipment Exercise tolerated well No report of concerns or symptoms today Strength training completed today  Goals Unmet:  Not Applicable  Comments: Pt able to follow exercise prescription today without complaint.  Will continue to monitor for progression.    Dr. Bethann Punches is Medical Director for [facility] Cardiac Rehabilitation.  Dr. Vida Rigger is Medical Director for [facility] Pulmonary Rehabilitation.

## 2022-10-08 ENCOUNTER — Ambulatory Visit: Payer: BC Managed Care – PPO

## 2022-10-08 ENCOUNTER — Encounter: Payer: BC Managed Care – PPO | Attending: Pulmonary Disease | Admitting: *Deleted

## 2022-10-08 DIAGNOSIS — G4733 Obstructive sleep apnea (adult) (pediatric): Secondary | ICD-10-CM | POA: Insufficient documentation

## 2022-10-08 DIAGNOSIS — R0602 Shortness of breath: Secondary | ICD-10-CM | POA: Insufficient documentation

## 2022-10-08 DIAGNOSIS — R400 Somnolence: Secondary | ICD-10-CM | POA: Diagnosis not present

## 2022-10-08 DIAGNOSIS — I4891 Unspecified atrial fibrillation: Secondary | ICD-10-CM | POA: Insufficient documentation

## 2022-10-08 DIAGNOSIS — I471 Supraventricular tachycardia, unspecified: Secondary | ICD-10-CM | POA: Insufficient documentation

## 2022-10-08 DIAGNOSIS — J449 Chronic obstructive pulmonary disease, unspecified: Secondary | ICD-10-CM | POA: Insufficient documentation

## 2022-10-08 DIAGNOSIS — R0683 Snoring: Secondary | ICD-10-CM | POA: Insufficient documentation

## 2022-10-08 NOTE — Progress Notes (Signed)
Daily Session Note  Patient Details  Name: [name] MRN: [id number] Date of Birth: 1956-02-02 Referring Provider:   Flowsheet Row Pulmonary Rehab from 08/06/2022 in [facility] Cardiac and Pulmonary Rehab  Referring Provider Dr. Sarina Ser, MD       Encounter Date: 10/08/2022  Check In:      Social History   Tobacco Use  Smoking Status Every Day   Packs/day: 1.50   Years: 39.00   Additional pack years: 0.00   Total pack years: 58.50   Types: Cigarettes  Smokeless Tobacco Never  Tobacco Comments   2 cigarettes a day- 08/06/2022 khj    Goals Met:  Independence with exercise equipment Exercise tolerated well No report of concerns or symptoms today  Goals Unmet:  Not Applicable  Comments: Pt able to follow exercise prescription today without complaint.  Will continue to monitor for progression.    Dr. Bethann Punches is Medical Director for [facility] Cardiac Rehabilitation.  Dr. Vida Rigger is Medical Director for [facility] Pulmonary Rehabilitation.

## 2022-10-11 ENCOUNTER — Encounter: Payer: BC Managed Care – PPO | Admitting: *Deleted

## 2022-10-11 DIAGNOSIS — R0602 Shortness of breath: Secondary | ICD-10-CM

## 2022-10-11 DIAGNOSIS — J449 Chronic obstructive pulmonary disease, unspecified: Secondary | ICD-10-CM | POA: Diagnosis not present

## 2022-10-11 NOTE — Progress Notes (Signed)
Daily Session Note  Patient Details  Name: [name] MRN: [id number] Date of Birth: 1955-11-02 Referring Provider:   Flowsheet Row Pulmonary Rehab from 08/06/2022 in [facility] Cardiac and Pulmonary Rehab  Referring Provider Dr. Sarina Ser, MD       Encounter Date: 10/11/2022  Check In:  Session Check In - 10/11/22 1726       Check-In   Supervising physician immediately available to respond to emergencies See telemetry face sheet for immediately available ER MD    Staff Present Elige Ko, RCP,RRT,BSRT;Noah [place], BS, Exercise Physiologist    Virtual Visit No    Medication changes reported     No    Fall or balance concerns reported    No    Tobacco Cessation No Change    Warm-up and Cool-down Performed on first and last piece of equipment    Resistance Training Performed Yes    VAD Patient? No    PAD/SET Patient? No      Pain Assessment   Currently in Pain? No/denies                Social History   Tobacco Use  Smoking Status Every Day   Packs/day: 1.50   Years: 39.00   Additional pack years: 0.00   Total pack years: 58.50   Types: Cigarettes  Smokeless Tobacco Never  Tobacco Comments   2 cigarettes a day- 08/06/2022 khj    Goals Met:  Independence with exercise equipment Exercise tolerated well No report of concerns or symptoms today  Goals Unmet:  Not Applicable  Comments: Pt able to follow exercise prescription today without complaint.  Will continue to monitor for progression.    Dr. Bethann Punches is Medical Director for [facility] Cardiac Rehabilitation.  Dr. Vida Rigger is Medical Director for [facility] Pulmonary Rehabilitation.

## 2022-10-15 ENCOUNTER — Encounter: Payer: BC Managed Care – PPO | Admitting: *Deleted

## 2022-10-15 DIAGNOSIS — J449 Chronic obstructive pulmonary disease, unspecified: Secondary | ICD-10-CM | POA: Diagnosis not present

## 2022-10-15 NOTE — Progress Notes (Signed)
Daily Session Note  Patient Details  Name: [name] MRN: [id number] Date of Birth: [birth year] Referring Provider:   Flowsheet Row Pulmonary Rehab from 08/06/2022 in [facility] Cardiac and Pulmonary Rehab  Referring Provider Dr. Sarina Ser, MD       Encounter Date: 10/15/2022  Check In:  Session Check In - 10/15/22 1712       Check-In   Supervising physician immediately available to respond to emergencies See telemetry face sheet for immediately available ER MD    Location [name]    Staff Present Hulen Luster, BS, RRT, CPFT;Rakan Soffer Jewel Baize, RN BSN;Joseph [place]ino [place], RCP,RRT,BSRT    Virtual Visit No    Medication changes reported     No    Fall or balance concerns reported    No    Tobacco Cessation No Change    Current number of cigarettes/nicotine per day     4    Warm-up and Cool-down Performed on first and last piece of equipment    Resistance Training Performed Yes    VAD Patient? No    PAD/SET Patient? No      Pain Assessment   Currently in Pain? No/denies                Social History   Tobacco Use  Smoking Status Every Day   Packs/day: 1.50   Years: 39.00   Additional pack years: 0.00   Total pack years: 58.50   Types: Cigarettes  Smokeless Tobacco Never  Tobacco Comments   2 cigarettes a day- 08/06/2022 khj    Goals Met:  Independence with exercise equipment Exercise tolerated well No report of concerns or symptoms today Strength training completed today  Goals Unmet:  Not Applicable  Comments: Pt able to follow exercise prescription today without complaint.  Will continue to monitor for progression.    Dr. Bethann Punches is Medical Director for [facility] Cardiac Rehabilitation.  Dr. Vida Rigger is Medical Director for [facility] Pulmonary Rehabilitation.

## 2022-10-17 ENCOUNTER — Encounter: Payer: Self-pay | Admitting: *Deleted

## 2022-10-17 DIAGNOSIS — R0602 Shortness of breath: Secondary | ICD-10-CM

## 2022-10-17 DIAGNOSIS — J449 Chronic obstructive pulmonary disease, unspecified: Secondary | ICD-10-CM

## 2022-10-17 NOTE — Progress Notes (Signed)
Pulmonary Individual Treatment Plan  Patient Details  Name: [name] MRN: [id number] Date of Birth: 1955/11/17 Referring Provider:   Flowsheet Row Pulmonary Rehab from 08/06/2022 in [facility] Cardiac and Pulmonary Rehab  Referring Provider Dr. Sarina Ser, MD       Initial Encounter Date:  Flowsheet Row Pulmonary Rehab from 08/06/2022 in [facility] Cardiac and Pulmonary Rehab  Date 08/06/22       Visit Diagnosis: Chronic obstructive pulmonary disease, unspecified COPD type (HCC)  Shortness of breath  Patient's Home Medications on Admission:  Current Outpatient Medications:    albuterol (VENTOLIN HFA) 108 (90 Base) MCG/ACT inhaler, Inhale 2 puffs into the lungs every 6 (six) hours as needed for wheezing or shortness of breath., Disp: 8 g, Rfl: 2   atorvastatin (LIPITOR) 40 MG tablet, Take 1 tablet (40 mg total) by mouth daily., Disp: 90 tablet, Rfl: 1   citalopram (CELEXA) 40 MG tablet, Take 1.5 tablets (60 mg total) by mouth daily., Disp: 135 tablet, Rfl: 1   furosemide (LASIX) 20 MG tablet, Take 1 tablet (20 mg total) by mouth 3 (three) times a week., Disp: 45 tablet, Rfl: 3   levothyroxine (SYNTHROID) 125 MCG tablet, Take 1 tablet (125 mcg total) by mouth daily with breakfast., Disp: 90 tablet, Rfl: 3   pantoprazole (PROTONIX) 20 MG tablet, Take 1 tablet (20 mg total) by mouth daily., Disp: 90 tablet, Rfl: 3   propafenone (RYTHMOL) 225 MG tablet, TAKE 1 TABLET BY MOUTH DAILY AS NEEDED FOR AFIB, Disp: 30 tablet, Rfl: 1   Spacer/Aero-Holding Chambers DEVI, 1 Device by Does not apply route as needed., Disp: 2 each, Rfl: 0   Tiotropium Bromide-Olodaterol (STIOLTO RESPIMAT) 2.5-2.5 MCG/ACT AERS, Inhale 2 puffs into the lungs daily., Disp: 4 g, Rfl: 5 No current facility-administered medications for this visit.  Facility-Administered Medications Ordered in Other Visits:    albuterol (PROVENTIL) (2.5 MG/3ML) 0.083% nebulizer solution 2.5 mg, 2.5 mg, Nebulization, Once, Duke Salvia,  MD  Past Medical History: Past Medical History:  Diagnosis Date   Anxiety state, unspecified    Back pain    Chest pain    a. 2008 Cath: reportedly nl;  b. 09/2012 Lexi MV: EF 75%, soft tissue attenuation->Low risk.   COPD (chronic obstructive pulmonary disease) (HCC)    CTS (carpal tunnel syndrome)    Depression    GERD (gastroesophageal reflux disease)    Migraines    Neck pain    Nontoxic multinodular goiter    Other and unspecified hyperlipidemia    Other and unspecified ovarian cyst    left   Other chest pain    PAF (paroxysmal atrial fibrillation) (HCC)    a. 07/2007 Echo: EF 65%, mildly dil LA;  b. currently on propafenone;  c. CHA2DS2VASc = 1 (gender)-->No anticoagulation.   PTSD (post-traumatic stress disorder)    Tobacco use    Tobacco use disorder    Unspecified hypothyroidism    Unspecified sleep apnea    resolved s/p UPPP surgery   Vitamin B12 deficiency     Tobacco Use: Social History   Tobacco Use  Smoking Status Every Day   Packs/day: 1.50   Years: 39.00   Additional pack years: 0.00   Total pack years: 58.50   Types: Cigarettes  Smokeless Tobacco Never  Tobacco Comments   2 cigarettes a day- 08/06/2022 khj    Labs: Review Flowsheet  More data exists      Latest Ref Rng & Units 10/09/2017 11/20/2019 01/30/2021 03/26/2022 09/24/2022  Labs for ITP Cardiac and Pulmonary Rehab  Cholestrol 100 - 199 mg/dL 213  086  578  469  629   LDL (calc) 0 - 99 mg/dL 528  413  82  244  010   HDL-C >39 mg/dL 45  41  39  55  45   Trlycerides 0 - 149 mg/dL 272  536  644  034  742   Hemoglobin A1c 4.8 - 5.6 % - - 5.5  - -     Pulmonary Assessment Scores:  Pulmonary Assessment Scores     Row Name 08/06/22 1034         ADL UCSD   ADL Phase Entry     SOB Score total 39     Rest 0     Walk 3     Stairs 5     Bath 1     Dress 1     Shop 1       CAT Score   CAT Score 8       mMRC Score   mMRC Score 1              UCSD: Self-administered rating of  dyspnea associated with activities of daily living (ADLs) 6-point scale (0 = "not at all" to 5 = "maximal or unable to do because of breathlessness")  Scoring Scores range from 0 to 120.  Minimally important difference is 5 units  CAT: CAT can identify the health impairment of COPD patients and is better correlated with disease progression.  CAT has a scoring range of zero to 40. The CAT score is classified into four groups of low (less than 10), medium (10 - 20), high (21-30) and very high (31-40) based on the impact level of disease on health status. A CAT score over 10 suggests significant symptoms.  A worsening CAT score could be explained by an exacerbation, poor medication adherence, poor inhaler technique, or progression of COPD or comorbid conditions.  CAT MCID is 2 points  mMRC: mMRC (Modified Medical Research Council) Dyspnea Scale is used to assess the degree of baseline functional disability in patients of respiratory disease due to dyspnea. No minimal important difference is established. A decrease in score of 1 point or greater is considered a positive change.   Pulmonary Function Assessment:   Exercise Target Goals: Exercise Program Goal: Individual exercise prescription set using results from initial 6 min walk test and THRR while considering  patient's activity barriers and safety.   Exercise Prescription Goal: Initial exercise prescription builds to 30-45 minutes a day of aerobic activity, 2-3 days per week.  Home exercise guidelines will be given to patient during program as part of exercise prescription that the participant will acknowledge.  Education: Aerobic Exercise: - Group verbal and visual presentation on the components of exercise prescription. Introduces F.I.T.T principle from ACSM for exercise prescriptions.  Reviews F.I.T.T. principles of aerobic exercise including progression. Written material given at graduation. Flowsheet Row Pulmonary Rehab from 09/12/2022 in  [facility] Cardiac and Pulmonary Rehab  Education need identified 08/06/22       Education: Resistance Exercise: - Group verbal and visual presentation on the components of exercise prescription. Introduces F.I.T.T principle from ACSM for exercise prescriptions  Reviews F.I.T.T. principles of resistance exercise including progression. Written material given at graduation.    Education: Exercise & Equipment Safety: - Individual verbal instruction and demonstration of equipment use and safety with use of the equipment. Flowsheet Row Pulmonary Rehab from 09/12/2022 in [facility]  Cardiac and Pulmonary Rehab  Date 08/06/22  Educator NT  Instruction Review Code 1- Verbalizes Understanding       Education: Exercise Physiology & General Exercise Guidelines: - Group verbal and written instruction with models to review the exercise physiology of the cardiovascular system and associated critical values. Provides general exercise guidelines with specific guidelines to those with heart or lung disease.    Education: Flexibility, Balance, Mind/Body Relaxation: - Group verbal and visual presentation with interactive activity on the components of exercise prescription. Introduces F.I.T.T principle from ACSM for exercise prescriptions. Reviews F.I.T.T. principles of flexibility and balance exercise training including progression. Also discusses the mind body connection.  Reviews various relaxation techniques to help reduce and manage stress (i.e. Deep breathing, progressive muscle relaxation, and visualization). Balance handout provided to take home. Written material given at graduation.   Activity Barriers & Risk Stratification:  Activity Barriers & Cardiac Risk Stratification - 08/06/22 1050       Activity Barriers & Cardiac Risk Stratification   Activity Barriers Back Problems;Shortness of Breath;Joint Problems             6 Minute Walk:  6 Minute Walk     Row Name 08/06/22 1043         6 Minute  Walk   Phase Initial     Distance 1280 feet     Walk Time 6 minutes     # of Rest Breaks 0     MPH 2.42     METS 2.86     RPE 11     Perceived Dyspnea  1     VO2 Peak 10.01     Symptoms Yes (comment)     Comments Chronic Back Pain 9/10     Resting HR 68 bpm     Resting BP 126/74     Resting Oxygen Saturation  98 %     Exercise Oxygen Saturation  during 6 min walk 93 %     Max Ex. HR 105 bpm     Max Ex. BP 160/80     2 Minute Post BP 144/78       Interval HR   1 Minute HR 88     2 Minute HR 97     3 Minute HR 97     4 Minute HR 102     5 Minute HR 104     6 Minute HR 105     2 Minute Post HR 70     Interval Heart Rate? Yes       Interval Oxygen   Interval Oxygen? Yes     Baseline Oxygen Saturation % 98 %     1 Minute Oxygen Saturation % 93 %     1 Minute Liters of Oxygen 0 L     2 Minute Oxygen Saturation % 94 %     2 Minute Liters of Oxygen 0 L     3 Minute Oxygen Saturation % 94 %     3 Minute Liters of Oxygen 0 L     4 Minute Oxygen Saturation % 94 %     4 Minute Liters of Oxygen 0 L     5 Minute Oxygen Saturation % 93 %     5 Minute Liters of Oxygen 0 L     6 Minute Oxygen Saturation % 95 %     6 Minute Liters of Oxygen 0 L     2 Minute Post Oxygen Saturation % 96 %  2 Minute Post Liters of Oxygen 0 L             Oxygen Initial Assessment:  Oxygen Initial Assessment - 07/26/22 1333       Home Oxygen   Home Oxygen Device None    Sleep Oxygen Prescription None    Home Exercise Oxygen Prescription None    Home Resting Oxygen Prescription None    Compliance with Home Oxygen Use Yes      Initial 6 min Walk   Oxygen Used None      Program Oxygen Prescription   Program Oxygen Prescription None      Intervention   Short Term Goals To learn and understand importance of monitoring SPO2 with pulse oximeter and demonstrate accurate use of the pulse oximeter.;To learn and understand importance of maintaining oxygen saturations>88%;To learn and  demonstrate proper pursed lip breathing techniques or other breathing techniques. ;To learn and demonstrate proper use of respiratory medications    Long  Term Goals Verbalizes importance of monitoring SPO2 with pulse oximeter and return demonstration;Maintenance of O2 saturations>88%;Exhibits proper breathing techniques, such as pursed lip breathing or other method taught during program session;Compliance with respiratory medication;Demonstrates proper use of MDI's             Oxygen Re-Evaluation:  Oxygen Re-Evaluation     Row Name 08/13/22 1723 09/12/22 1731 10/11/22 1739         Program Oxygen Prescription   Program Oxygen Prescription -- None None       Home Oxygen   Home Oxygen Device -- None None     Sleep Oxygen Prescription -- CPAP CPAP     Home Exercise Oxygen Prescription -- None None     Home Resting Oxygen Prescription -- None None     Compliance with Home Oxygen Use -- Yes Yes       Goals/Expected Outcomes   Short Term Goals -- To learn and understand importance of monitoring SPO2 with pulse oximeter and demonstrate accurate use of the pulse oximeter.;To learn and understand importance of maintaining oxygen saturations>88%;To learn and demonstrate proper pursed lip breathing techniques or other breathing techniques. ;To learn and demonstrate proper use of respiratory medications To learn and understand importance of maintaining oxygen saturations>88%;To learn and understand importance of monitoring SPO2 with pulse oximeter and demonstrate accurate use of the pulse oximeter.     Long  Term Goals -- Verbalizes importance of monitoring SPO2 with pulse oximeter and return demonstration;Maintenance of O2 saturations>88%;Exhibits proper breathing techniques, such as pursed lip breathing or other method taught during program session;Compliance with respiratory medication;Demonstrates proper use of MDI's Maintenance of O2 saturations>88%;Verbalizes importance of monitoring SPO2 with  pulse oximeter and return demonstration     Comments Reviewed PLB technique with pt.  Talked about how it works and it's importance in maintaining their exercise saturations. Patient reported that she uses her CPAP as consistent as she can at home. She aslo reports that she feels comfortable using PLB when she has shortness of breath. Patient has a pulse oximeter to check oxygen saturation at home. Informed and explained to patient why it is important to check oxygen at rest and on exertion at home. Informed patient that they should be 88 percent and above with their oxygen readings and that it is important to have levels above 88 to prevent damage to tissues in the body. Patient verbalizes understanding.     Goals/Expected Outcomes Short: Become more profiecient at using PLB.   Long: Become independent  at using PLB. Short: Become more profiecient at using PLB. continue to try to be consistent with CPAP use at night.   Long: Become independent at using PLB. Short: monitor oxygen at rest and with exertion at home. Long: maintain oxygen saturations above 88 percent independently.              Oxygen Discharge (Final Oxygen Re-Evaluation):  Oxygen Re-Evaluation - 10/11/22 1739       Program Oxygen Prescription   Program Oxygen Prescription None      Home Oxygen   Home Oxygen Device None    Sleep Oxygen Prescription CPAP    Home Exercise Oxygen Prescription None    Home Resting Oxygen Prescription None    Compliance with Home Oxygen Use Yes      Goals/Expected Outcomes   Short Term Goals To learn and understand importance of maintaining oxygen saturations>88%;To learn and understand importance of monitoring SPO2 with pulse oximeter and demonstrate accurate use of the pulse oximeter.    Long  Term Goals Maintenance of O2 saturations>88%;Verbalizes importance of monitoring SPO2 with pulse oximeter and return demonstration    Comments Patient has a pulse oximeter to check oxygen saturation at  home. Informed and explained to patient why it is important to check oxygen at rest and on exertion at home. Informed patient that they should be 88 percent and above with their oxygen readings and that it is important to have levels above 88 to prevent damage to tissues in the body. Patient verbalizes understanding.    Goals/Expected Outcomes Short: monitor oxygen at rest and with exertion at home. Long: maintain oxygen saturations above 88 percent independently.             Initial Exercise Prescription:  Initial Exercise Prescription - 08/06/22 1100       Date of Initial Exercise RX and Referring Provider   Date 08/06/22    Referring Provider Dr. Sarina Ser, MD      Oxygen   Maintain Oxygen Saturation 88% or higher      Treadmill   MPH 2.4    Grade 0    Minutes 15    METs 2.84      Recumbant Bike   Level 2    RPM 50    Watts 26    Minutes 15    METs 2.86      NuStep   Level 2    SPM 80    Minutes 15    METs 2.86      Biostep-RELP   Level 2    SPM 50    Minutes 15    METs 2.86      Prescription Details   Frequency (times per week) 2    Duration Progress to 30 minutes of continuous aerobic without signs/symptoms of physical distress      Intensity   THRR 40-80% of Max Heartrate 102-136    Ratings of Perceived Exertion 11-13    Perceived Dyspnea 0-4      Progression   Progression Continue to progress workloads to maintain intensity without signs/symptoms of physical distress.      Resistance Training   Training Prescription Yes    Weight 4 lb    Reps 10-15             Perform Capillary Blood Glucose checks as needed.  Exercise Prescription Changes:   Exercise Prescription Changes     Row Name 08/06/22 1100 08/15/22 1700 08/27/22 1300 09/10/22 1100 09/24/22 1300  Response to Exercise   Blood Pressure (Admit) 126/74 138/84 136/70 124/70 122/72   Blood Pressure (Exercise) 160/80 166/86 160/82 138/64 144/80   Blood Pressure (Exit)  144/78 134/72 112/74 104/62 112/66   Heart Rate (Admit) 68 bpm 78 bpm 84 bpm 83 bpm 75 bpm   Heart Rate (Exercise) 105 bpm 118 bpm 114 bpm 117 bpm 102 bpm   Heart Rate (Exit) 70 bpm 80 bpm 93 bpm 89 bpm 92 bpm   Oxygen Saturation (Admit) 98 % 96 % 95 % 94 % 95 %   Oxygen Saturation (Exercise) 93 % 93 % 91 % 91 % 91 %   Oxygen Saturation (Exit) 96 % 97 % 93 % 95 % 92 %   Rating of Perceived Exertion (Exercise) 11 11 13 12 13    Perceived Dyspnea (Exercise) 1 -- -- -- --   Symptoms Chronic Back Pain 9/10 none none none none   Comments Results 1st full day of exercise -- -- --   Duration -- Progress to 30 minutes of  aerobic without signs/symptoms of physical distress Continue with 30 min of aerobic exercise without signs/symptoms of physical distress. Continue with 30 min of aerobic exercise without signs/symptoms of physical distress. Continue with 30 min of aerobic exercise without signs/symptoms of physical distress.   Intensity -- THRR unchanged THRR unchanged THRR unchanged THRR unchanged     Progression   Progression -- Continue to progress workloads to maintain intensity without signs/symptoms of physical distress. Continue to progress workloads to maintain intensity without signs/symptoms of physical distress. Continue to progress workloads to maintain intensity without signs/symptoms of physical distress. Continue to progress workloads to maintain intensity without signs/symptoms of physical distress.   Average METs -- 2.62 2.45 2.95 2.89     Resistance Training   Training Prescription -- Yes Yes Yes Yes   Weight -- 4 lb 4 lb 4 lb 4 lb   Reps -- 10-15 10-15 10-15 10-15     Interval Training   Interval Training -- No No No No     Treadmill   MPH -- 1.9 2.1 2.4 2.4   Grade -- 0 0 0 0   Minutes -- 15 15 15 15    METs -- 2.45 2.61 2.84 2.84     Recumbant Bike   Level -- -- 2 3 5    Watts -- -- 22 27 34   Minutes -- -- 15 15 15    METs -- -- 2.65 2.87 3.08     NuStep   Level  -- 2 2 3 4    Minutes -- 15 15 15 15    METs -- 2.8 2.3 3.1 3.4     Biostep-RELP   Level -- -- -- -- 1   Minutes -- -- -- -- 15   METs -- -- -- -- 3     Oxygen   Maintain Oxygen Saturation -- 88% or higher 88% or higher 88% or higher 88% or higher    Row Name 10/04/22 1800 10/08/22 1400           Response to Exercise   Blood Pressure (Admit) -- 124/76      Blood Pressure (Exit) -- 132/76      Heart Rate (Admit) -- 72 bpm      Heart Rate (Exercise) -- 86 bpm      Heart Rate (Exit) -- 79 bpm      Oxygen Saturation (Admit) -- 92 %      Oxygen Saturation (Exercise) -- 90 %  Oxygen Saturation (Exit) -- 90 %      Rating of Perceived Exertion (Exercise) -- 11      Perceived Dyspnea (Exercise) -- 0      Symptoms -- none      Duration -- Continue with 30 min of aerobic exercise without signs/symptoms of physical distress.      Intensity -- THRR unchanged        Progression   Progression -- Continue to progress workloads to maintain intensity without signs/symptoms of physical distress.      Average METs -- 2.8        Resistance Training   Training Prescription -- Yes      Weight -- 4 lb      Reps -- 10-15        Interval Training   Interval Training -- No        NuStep   Level -- 4      Minutes -- 15      METs -- 2.6        Biostep-RELP   Level -- 3      Minutes -- 15        Home Exercise Plan   Plans to continue exercise at Home (comment)  Walking twice a week Home (comment)  Walking twice a week      Frequency Add 3 additional days to program exercise sessions. Add 3 additional days to program exercise sessions.      Initial Home Exercises Provided 10/04/22 10/04/22        Oxygen   Maintain Oxygen Saturation 88% or higher 88% or higher               Exercise Comments:   Exercise Comments     Row Name 08/13/22 1721           Exercise Comments First full day of exercise!  Patient was oriented to gym and equipment including functions, settings,  policies, and procedures.  Patient's individual exercise prescription and treatment plan were reviewed.  All starting workloads were established based on the results of the 6 minute walk test done at initial orientation visit.  The plan for exercise progression was also introduced and progression will be customized based on patient's performance and goals.                Exercise Goals and Review:   Exercise Goals     Row Name 08/06/22 1050             Exercise Goals   Increase Physical Activity Yes       Intervention Provide advice, education, support and counseling about physical activity/exercise needs.;Develop an individualized exercise prescription for aerobic and resistive training based on initial evaluation findings, risk stratification, comorbidities and participant's personal goals.       Expected Outcomes Short Term: Attend rehab on a regular basis to increase amount of physical activity.;Long Term: Add in home exercise to make exercise part of routine and to increase amount of physical activity.;Long Term: Exercising regularly at least 3-5 days a week.       Increase Strength and Stamina Yes       Intervention Provide advice, education, support and counseling about physical activity/exercise needs.;Develop an individualized exercise prescription for aerobic and resistive training based on initial evaluation findings, risk stratification, comorbidities and participant's personal goals.       Expected Outcomes Short Term: Increase workloads from initial exercise prescription for resistance, speed, and METs.;Short Term: Perform resistance training  exercises routinely during rehab and add in resistance training at home;Long Term: Improve cardiorespiratory fitness, muscular endurance and strength as measured by increased METs and functional capacity ( )       Able to understand and use rate of perceived exertion (RPE) scale Yes       Intervention Provide education and explanation  on how to use RPE scale       Expected Outcomes Short Term: Able to use RPE daily in rehab to express subjective intensity level;Long Term:  Able to use RPE to guide intensity level when exercising independently       Able to understand and use Dyspnea scale Yes       Intervention Provide education and explanation on how to use Dyspnea scale       Expected Outcomes Short Term: Able to use Dyspnea scale daily in rehab to express subjective sense of shortness of breath during exertion;Long Term: Able to use Dyspnea scale to guide intensity level when exercising independently       Knowledge and understanding of Target Heart Rate Range (THRR) Yes       Intervention Provide education and explanation of THRR including how the numbers were predicted and where they are located for reference       Expected Outcomes Short Term: Able to state/look up THRR;Long Term: Able to use THRR to govern intensity when exercising independently;Short Term: Able to use daily as guideline for intensity in rehab       Able to check pulse independently Yes       Intervention Provide education and demonstration on how to check pulse in carotid and radial arteries.;Review the importance of being able to check your own pulse for safety during independent exercise       Expected Outcomes Short Term: Able to explain why pulse checking is important during independent exercise;Long Term: Able to check pulse independently and accurately       Understanding of Exercise Prescription Yes       Intervention Provide education, explanation, and written materials on patient's individual exercise prescription       Expected Outcomes Short Term: Able to explain program exercise prescription;Long Term: Able to explain home exercise prescription to exercise independently                Exercise Goals Re-Evaluation :  Exercise Goals Re-Evaluation     Row Name 08/13/22 1722 08/15/22 1801 08/27/22 1315 09/10/22 1158 09/12/22 1755      Exercise Goal Re-Evaluation   Exercise Goals Review Increase Physical Activity;Able to understand and use rate of perceived exertion (RPE) scale;Knowledge and understanding of Target Heart Rate Range (THRR);Understanding of Exercise Prescription;Increase Strength and Stamina;Able to understand and use Dyspnea scale;Able to check pulse independently Increase Physical Activity;Increase Strength and Stamina;Understanding of Exercise Prescription Increase Physical Activity;Increase Strength and Stamina;Understanding of Exercise Prescription Increase Physical Activity;Increase Strength and Stamina;Understanding of Exercise Prescription Increase Physical Activity;Increase Strength and Stamina;Understanding of Exercise Prescription   Comments Reviewed RPE scale, THR and program prescription with pt today.  Pt voiced understanding and was given a copy of goals to take home. [name] did well for her first session of rehab.  She was able to do level 2 on the Nustep and 1.9 mph on the treadmill. Her RPEs are appropriate  with exercise. We will continue to monitor as she continues to progress in the program. [name] continues to do well in the program. She recently increased her treadmill speed to 2.1 mph with no incline.  She also began using the recumbent bike and did well with level 2. She has continued to use 4 lb hand weights for resistance training as well. We will continue to monitor her progress in the program. [name] continues to do well in rehab. She increased her treadmill speed to 2.4 mph. She could benefit from adding an incline to her prescription. She also increased to level 3 on the recumbent bike and T4 Nustep. All with appropriate RPEs.She continues to reach her THR most sessions. Will continue to monitor. Patient reports that she is procressing with her exercise in class and continue to increase her work loads when tolerated. She has not been exercising at home but packing boxes. She has some frustration that she can not  do things at home such as lifiting and walking like she used to be able to do. Time prohibited going over home exercise guidelines today but this will be done in the near future with program staff.   Expected Outcomes Short: Use RPE daily to regulate intensity.  Long: Follow program prescription in THR. Short: Continue to exercise at initial exercise prescription Long: Increase overall MET level and stamina Short: Try to add incline to treadmill workload. Long: Continue to improve strength and stamina. Short:Add small incline to treadmill Long: Continue to increase overall MET level and stamina Short: go over home exercise guidelines with staff memeber. Long: become independent with exercise program.    Row Name 09/24/22 1354 10/04/22 1818 10/08/22 1448         Exercise Goal Re-Evaluation   Exercise Goals Review Increase Physical Activity;Increase Strength and Stamina;Understanding of Exercise Prescription Increase Physical Activity;Increase Strength and Stamina;Able to understand and use rate of perceived exertion (RPE) scale;Able to understand and use Dyspnea scale;Able to check pulse independently;Knowledge and understanding of Target Heart Rate Range (THRR);Understanding of Exercise Prescription Increase Physical Activity;Increase Strength and Stamina;Understanding of Exercise Prescription     Comments [name] continues to do well in rehab. She continues to walk on the treadmill at a speed of 2.4 mph with an incline of 0%. She also improved to level 5 on the recumbent bike and level 4 on the T4 nustep. She has conitnued to use 3 lb hand weights for resistance training as well. We will continue to monitor her progress in the program. Reviewed home exercise with pt today.  Pt plans to walk at home for exercise.  Reviewed THR, pulse, RPE, sign and symptoms, pulse oximetery and when to call 911 or MD.  Also discussed weather considerations and indoor options.  Pt voiced understanding. [name] is doing well in rehab.   She [place] up to level 3 on the BioStep.  Her RPE is staying around 11 so we will encourage her to push her spm or increase her workload to aim for 12-13 on RPE.     Expected Outcomes Short: Continue to progressively increase treadmill workload. Long: Continue to improve strength and stamina. Short: Begin walking at home twice a week in addition to rehab. Long: Continue to exercise independently. Short: Increase RPM or level on equipment for RPE 12-13 Long: Continue to improve stamina              Discharge Exercise Prescription (Final Exercise Prescription Changes):  Exercise Prescription Changes - 10/08/22 1400       Response to Exercise   Blood Pressure (Admit) 124/76    Blood Pressure (Exit) 132/76    Heart Rate (Admit) 72 bpm    Heart Rate (Exercise) 86 bpm  Heart Rate (Exit) 79 bpm    Oxygen Saturation (Admit) 92 %    Oxygen Saturation (Exercise) 90 %    Oxygen Saturation (Exit) 90 %    Rating of Perceived Exertion (Exercise) 11    Perceived Dyspnea (Exercise) 0    Symptoms none    Duration Continue with 30 min of aerobic exercise without signs/symptoms of physical distress.    Intensity THRR unchanged      Progression   Progression Continue to progress workloads to maintain intensity without signs/symptoms of physical distress.    Average METs 2.8      Resistance Training   Training Prescription Yes    Weight 4 lb    Reps 10-15      Interval Training   Interval Training No      NuStep   Level 4    Minutes 15    METs 2.6      Biostep-RELP   Level 3    Minutes 15      Home Exercise Plan   Plans to continue exercise at Home (comment)   Walking twice a week   Frequency Add 3 additional days to program exercise sessions.    Initial Home Exercises Provided 10/04/22      Oxygen   Maintain Oxygen Saturation 88% or higher             Nutrition:  Target Goals: Understanding of nutrition guidelines, daily intake of sodium 1500mg , cholesterol 200mg , calories  30% from fat and 7% or less from saturated fats, daily to have 5 or more servings of fruits and vegetables.  Education: All About Nutrition: -Group instruction provided by verbal, written material, interactive activities, discussions, models, and posters to present general guidelines for heart healthy nutrition including fat, fiber, MyPlate, the role of sodium in heart healthy nutrition, utilization of the nutrition label, and utilization of this knowledge for meal planning. Follow up email sent as well. Written material given at graduation.   Biometrics:  Pre Biometrics - 08/06/22 1051       Pre Biometrics   Height 5\' 5"  (1.651 m)    Weight 215 lb 9.6 oz (97.8 kg)    Waist Circumference 41.5 inches    Hip Circumference 43 inches    Waist to Hip Ratio 0.97 %    BMI (Calculated) 35.88    Single Leg Stand 30 seconds              Nutrition Therapy Plan and Nutrition Goals:  Nutrition Therapy & Goals - 08/20/22 1722       Nutrition Therapy   Diet Heart healthy, low Na    Drug/Food Interactions Statins/Certain Fruits    Protein (specify units) 60-70g    Fiber 25 grams    Whole Grain Foods 3 servings    Saturated Fats 12 max. grams    Fruits and Vegetables 8 servings/day    Sodium 2 grams      Personal Nutrition Goals   Nutrition Goal ST: include 6-8 smaller meals per day including heart healthy fats, fiber, and limiting salt LT: follow MyPlate guidelines    Comments 67 y.o. F admitted to pulmonary rehab for COPD. PMHx includes GERD, hypothyroidism, CKD stg 3, HLD, tobacco use, anxiety/depression, PTSD, vit B12 deficiency, OSA. PSHx includes cholecystectomy, thyroidectomy, parathyroidectomy. Documented homelessness 09/22/21, now housed. Labs reviewed. Medications reviewed atorvastatin, furosemide, synthroid, pantoprazole, celexa. [name] reports being unaware of any CKD stg 3 diagnosis -  discussed how that is in her past medical history  and encouraged her to speak with her MD regarding  this. [name] reports she would like to get back to 6-8 meals per day and cooking more heart healthy meals as she used to eat this way and enjoyed it - she lives with someone and is not able to eat this way; she hopes to find a new living situation within 2 weeks. She reports not eating much 15-30 oz of water raisin toast or multigrain peaches (canned in it's own juice) and pineapples. L: Small plate of leftovers (usually beans) D: beans with frozen steamable broccoli. Drinks: water with coffee (2-3 10-12oz cups). Discussed heart healthy eating and CKD stg 3 MNT.      Intervention Plan   Intervention Prescribe, educate and counsel regarding individualized specific dietary modifications aiming towards targeted core components such as weight, hypertension, lipid management, diabetes, heart failure and other comorbidities.    Expected Outcomes Short Term Goal: A plan has been developed with personal nutrition goals set during dietitian appointment.;Short Term Goal: Understand basic principles of dietary content, such as calories, fat, sodium, cholesterol and nutrients.;Long Term Goal: Adherence to prescribed nutrition plan.             Nutrition Assessments:  MEDIFICTS Score Key: ?70 Need to make dietary changes  40-70 Heart Healthy Diet ? 40 Therapeutic Level Cholesterol Diet  Flowsheet Row Pulmonary Rehab from 08/06/2022 in [facility]  Picture Your Plate Total Score on Admission 72      Picture Your Plate Scores: <29 Unhealthy dietary pattern with much room for improvement. 41-50 Dietary pattern unlikely to meet recommendations for good health and room for improvement. 51-60 More healthful dietary pattern, with some room for improvement.  >60 Healthy dietary pattern, although there may be some specific behaviors that could be improved.   Nutrition Goals Re-Evaluation:  Nutrition Goals Re-Evaluation     Row Name 09/12/22 1735 10/11/22 1745           Goals   Current  Weight -- 215 lb (97.5 kg)      Nutrition Goal ST: include 6-8 smaller meals per day including heart healthy fats, fiber, and limiting salt LT: follow MyPlate guidelines Eat smaller      Comment Patient reports that she is trying to follow dietary recommendations but has some instability in her living situation and ability to always cook on a routine schedule. She does report that she does not eat katchup becasue that causes her to retain fluid. Patient was informed on why it is important to maintain a balanced diet when dealing with Respiratory issues. Explained that it takes a lot of energy to breath and when they are short of breath often they will need to have a good diet to help keep up with the calories they are expending for breathing.      Expected Outcome Short: continue to work on securing living situaiton so that she has more control over her routine and ability to have access to a kitchen to cook healthy meals. Long: eat a heart healthy diet and eat 6-8 small meals a day once she has more stability in living situaiton. Short: Choose and plan snacks accordingly to patients caloric intake to improve breathing. Long: Maintain a diet independently that meets their caloric intake to aid in daily shortness of breath.               Nutrition Goals Discharge (Final Nutrition Goals Re-Evaluation):  Nutrition Goals Re-Evaluation - 10/11/22 1745  Goals   Current Weight 215 lb (97.5 kg)    Nutrition Goal Eat smaller    Comment Patient was informed on why it is important to maintain a balanced diet when dealing with Respiratory issues. Explained that it takes a lot of energy to breath and when they are short of breath often they will need to have a good diet to help keep up with the calories they are expending for breathing.    Expected Outcome Short: Choose and plan snacks accordingly to patients caloric intake to improve breathing. Long: Maintain a diet independently that meets their caloric  intake to aid in daily shortness of breath.             Psychosocial: Target Goals: Acknowledge presence or absence of significant depression and/or stress, maximize coping skills, provide positive support system. Participant is able to verbalize types and ability to use techniques and skills needed for reducing stress and depression.   Education: Stress, Anxiety, and Depression - Group verbal and visual presentation to define topics covered.  Reviews how body is impacted by stress, anxiety, and depression.  Also discusses healthy ways to reduce stress and to treat/manage anxiety and depression.  Written material given at graduation.   Education: Sleep Hygiene -Provides group verbal and written instruction about how sleep can affect your health.  Define sleep hygiene, discuss sleep cycles and impact of sleep habits. Review good sleep hygiene tips.    Initial Review & Psychosocial Screening:  Initial Psych Review & Screening - 07/26/22 1342       Initial Review   Current issues with Current Stress Concerns    Source of Stress Concerns Financial;Chronic Illness      Family Dynamics   Good Support System? Yes   daughters     Barriers   Psychosocial barriers to participate in program There are no identifiable barriers or psychosocial needs.;The patient should benefit from training in stress management and relaxation.      Screening Interventions   Interventions Encouraged to exercise;Provide feedback about the scores to participant    Expected Outcomes Short Term goal: Utilizing psychosocial counselor, staff and physician to assist with identification of specific Stressors or current issues interfering with healing process. Setting desired goal for each stressor or current issue identified.;Long Term Goal: Stressors or current issues are controlled or eliminated.;Short Term goal: Identification and review with participant of any Quality of Life or Depression concerns found by scoring  the questionnaire.;Long Term goal: The participant improves quality of Life and PHQ9 Scores as seen by post scores and/or verbalization of changes             Quality of Life Scores:  Scores of 19 and below usually indicate a poorer quality of life in these areas.  A difference of  2-3 points is a clinically meaningful difference.  A difference of 2-3 points in the total score of the Quality of Life Index has been associated with significant improvement in overall quality of life, self-image, physical symptoms, and general health in studies assessing change in quality of life.  PHQ-9: Review Flowsheet  More data exists      09/24/2022 08/06/2022 03/26/2022 02/01/2022 10/18/2021  Depression screen PHQ 2/9  Decreased Interest 1 0 0 0 0  Down, Depressed, Hopeless 1 0 0 0 0  PHQ - 2 Score 2 0 0 0 0  Altered sleeping 2 1 1 3  -  Tired, decreased energy 2 1 1  0 -  Change in appetite 1 0  0 0 -  Feeling bad or failure about yourself  0 0 0 0 -  Trouble concentrating 0 0 0 0 -  Moving slowly or fidgety/restless 0 0 0 0 -  Suicidal thoughts 0 0 0 0 -  PHQ-9 Score 7 2 2 3  -  Difficult doing work/chores - Not difficult at all - Not difficult at all -   Interpretation of Total Score  Total Score Depression Severity:  1-4 = Minimal depression, 5-9 = Mild depression, 10-14 = Moderate depression, 15-19 = Moderately severe depression, 20-27 = Severe depression   Psychosocial Evaluation and Intervention:  Psychosocial Evaluation - 07/26/22 1401       Psychosocial Evaluation & Interventions   Interventions Encouraged to exercise with the program and follow exercise prescription;Stress management education;Relaxation education    Comments [name] is coming to Pulmonary Rehab with dypsnea and mild COPD. She has noticed that her breathing and stamina have gotten a lot worse in the last year, where it is hard for her to walk even short distances where before she could walk with no problem. She had her  thyroid surgery years ago and states she has gained a lot of weight after that she can't seem to shake. She is working really hard on quitting smoking entirely. She tried the patches and had to stop because of their side effects. She has started changing her lifestyle habits, like drinking more water or going outside on her porch instead of smoking. She is down to very sporadically smoking. She mentioned that some triggers are her car (since is smells like smoke), when she is on the phone a lot during her work calls, and when her what used to be very big temper flares up. As far as her temper, she notes that she has made a lot of positive changes over the years and she doesn't let as many things bother her as she used to. She really likes her job at [employer] where she works from home most days. However, she notes that she sits a a lot more than she used to. She is looking forward to becoming more active, but hesitant on what she can actually do given her worsening health concerns.    Expected Outcomes Short: attend pulmonary rehab for education and exercise. Long: develop and maintain positive self care habits.    Continue Psychosocial Services  Follow up required by staff             Psychosocial Re-Evaluation:  Psychosocial Re-Evaluation     Row Name 09/12/22 1750 10/11/22 1747           Psychosocial Re-Evaluation   Current issues with Current Stress Concerns Current Stress Concerns      Comments no new mental health concerns but does continue to have stress related to current living situation and her daughter's health concerns. [name] is living with a friend she has known for 40 years. Her main stressor is her room mate that is an alcoholic. She likes her job and what she does but states that there is some drama that is bothering her at work.      Expected Outcomes Short: continue to work to a more stable living situation. Long: Continue to maintain good mental health habits. Short: Attend  [facility] stress management education to decrease stress. Long: Maintain exercise Post [facility] to keep stress at a minimum.      Interventions Encouraged to attend Pulmonary Rehabilitation for the exercise Encouraged to attend Pulmonary Rehabilitation for  the exercise      Continue Psychosocial Services  Follow up required by staff Follow up required by staff               Psychosocial Discharge (Final Psychosocial Re-Evaluation):  Psychosocial Re-Evaluation - 10/11/22 1747       Psychosocial Re-Evaluation   Current issues with Current Stress Concerns    Comments [name] is living with a friend she has known for 40 years. Her main stressor is her room mate that is an alcoholic. She likes her job and what she does but states that there is some drama that is bothering her at work.    Expected Outcomes Short: Attend [facility] stress management education to decrease stress. Long: Maintain exercise Post [facility] to keep stress at a minimum.    Interventions Encouraged to attend Pulmonary Rehabilitation for the exercise    Continue Psychosocial Services  Follow up required by staff             Education: Education Goals: Education classes will be provided on a weekly basis, covering required topics. Participant will state understanding/return demonstration of topics presented.  Learning Barriers/Preferences:  Learning Barriers/Preferences - 07/26/22 1342       Learning Barriers/Preferences   Learning Barriers None    Learning Preferences None             General Pulmonary Education Topics:  Infection Prevention: - Provides verbal and written material to individual with discussion of infection control including proper hand washing and proper equipment cleaning during exercise session. Flowsheet Row Pulmonary Rehab from 09/12/2022 in [facility] Cardiac and Pulmonary Rehab  Date 08/06/22  Educator NT  Instruction Review Code 1- Verbalizes Understanding       Falls Prevention: -  Provides verbal and written material to individual with discussion of falls prevention and safety. Flowsheet Row Pulmonary Rehab from 09/12/2022 in [facility] Cardiac and Pulmonary Rehab  Date 08/06/22  Educator NT  Instruction Review Code 1- Verbalizes Understanding       Chronic Lung Disease Review: - Group verbal instruction with posters, models, PowerPoint presentations and videos,  to review new updates, new respiratory medications, new advancements in procedures and treatments. Providing information on websites and "800" numbers for continued self-education. Includes information about supplement oxygen, available portable oxygen systems, continuous and intermittent flow rates, oxygen safety, concentrators, and Medicare reimbursement for oxygen. Explanation of Pulmonary Drugs, including class, frequency, complications, importance of spacers, rinsing mouth after steroid MDI's, and proper cleaning methods for nebulizers. Review of basic lung anatomy and physiology related to function, structure, and complications of lung disease. Review of risk factors. Discussion about methods for diagnosing sleep apnea and types of masks and machines for OSA. Includes a review of the use of types of environmental controls: home humidity, furnaces, filters, dust mite/pet prevention, HEPA vacuums. Discussion about weather changes, air quality and the benefits of nasal washing. Instruction on Warning signs, infection symptoms, calling MD promptly, preventive modes, and value of vaccinations. Review of effective airway clearance, coughing and/or vibration techniques. Emphasizing that all should Create an Action Plan. Written material given at graduation.   AED/CPR: - Group verbal and written instruction with the use of models to demonstrate the basic use of the AED with the basic ABC's of resuscitation.    Anatomy and Cardiac Procedures: - Group verbal and visual presentation and models provide information about basic  cardiac anatomy and function. Reviews the testing methods done to diagnose heart disease and the outcomes of the test results.  Describes the treatment choices: Medical Management, Angioplasty, or Coronary Bypass Surgery for treating various heart conditions including Myocardial Infarction, Angina, Valve Disease, and Cardiac Arrhythmias.  Written material given at graduation.   Medication Safety: - Group verbal and visual instruction to review commonly prescribed medications for heart and lung disease. Reviews the medication, class of the drug, and side effects. Includes the steps to properly store meds and maintain the prescription regimen.  Written material given at graduation.   Other: -Provides group and verbal instruction on various topics (see comments)   Knowledge Questionnaire Score:  Knowledge Questionnaire Score - 08/06/22 1031       Knowledge Questionnaire Score   Pre Score 14/18              Core Components/Risk Factors/Patient Goals at Admission:  Personal Goals and Risk Factors at Admission - 08/06/22 1035       Core Components/Risk Factors/Patient Goals on Admission    Weight Management Yes;Weight Gain    Intervention Weight Management: Develop a combined nutrition and exercise program designed to reach desired caloric intake, while maintaining appropriate intake of nutrient and fiber, sodium and fats, and appropriate energy expenditure required for the weight goal.;Weight Management: Provide education and appropriate resources to help participant work on and attain dietary goals.;Weight Management/Obesity: Establish reasonable short term and long term weight goals.    Admit Weight 215 lb 9.6 oz (97.8 kg)    Goal Weight: Short Term 205 lb (93 kg)    Goal Weight: Long Term 175 lb (79.4 kg)    Expected Outcomes Short Term: Continue to assess and modify interventions until short term weight is achieved;Long Term: Adherence to nutrition and physical activity/exercise  program aimed toward attainment of established weight goal;Weight Loss: Understanding of general recommendations for a balanced deficit meal plan, which promotes 1-2 lb weight loss per week and includes a negative energy balance of [phone] kcal/d;Understanding recommendations for meals to include 15-35% energy as protein, 25-35% energy from fat, 35-60% energy from carbohydrates, less than 200mg  of dietary cholesterol, 20-35 gm of total fiber daily;Understanding of distribution of calorie intake throughout the day with the consumption of 4-5 meals/snacks    Tobacco Cessation Yes    Number of packs per day --   inconsistent   Intervention Assist the participant in steps to quit. Provide individualized education and counseling about committing to Tobacco Cessation, relapse prevention, and pharmacological support that can be provided by physician.;Education officer, environmental, assist with locating and accessing local/national Quit Smoking programs, and support quit date choice.    Expected Outcomes Short Term: Will demonstrate readiness to quit, by selecting a quit date.;Short Term: Will quit all tobacco product use, adhering to prevention of relapse plan.;Long Term: Complete abstinence from all tobacco products for at least 12 months from quit date.    Improve shortness of breath with ADL's Yes    Intervention Provide education, individualized exercise plan and daily activity instruction to help decrease symptoms of SOB with activities of daily living.    Expected Outcomes Short Term: Improve cardiorespiratory fitness to achieve a reduction of symptoms when performing ADLs;Long Term: Be able to perform more ADLs without symptoms or delay the onset of symptoms    Hypertension Yes    Intervention Provide education on lifestyle modifcations including regular physical activity/exercise, weight management, moderate sodium restriction and increased consumption of fresh fruit, vegetables, and low fat dairy, alcohol  moderation, and smoking cessation.;Monitor prescription use compliance.    Expected Outcomes Short Term: Continued assessment  and intervention until BP is < 140/70mm HG in hypertensive participants. < 130/29mm HG in hypertensive participants with diabetes, heart failure or chronic kidney disease.;Long Term: Maintenance of blood pressure at goal levels.    Lipids Yes    Intervention Provide education and support for participant on nutrition & aerobic/resistive exercise along with prescribed medications to achieve LDL 70mg , HDL >40mg .    Expected Outcomes Short Term: Participant states understanding of desired cholesterol values and is compliant with medications prescribed. Participant is following exercise prescription and nutrition guidelines.;Long Term: Cholesterol controlled with medications as prescribed, with individualized exercise RX and with personalized nutrition plan. Value goals: LDL < 70mg , HDL > 40 mg.             Education:Diabetes - Individual verbal and written instruction to review signs/symptoms of diabetes, desired ranges of glucose level fasting, after meals and with exercise. Acknowledge that pre and post exercise glucose checks will be done for 3 sessions at entry of program.   Know Your Numbers and Heart Failure: - Group verbal and visual instruction to discuss disease risk factors for cardiac and pulmonary disease and treatment options.  Reviews associated critical values for Overweight/Obesity, Hypertension, Cholesterol, and Diabetes.  Discusses basics of heart failure: signs/symptoms and treatments.  Introduces Heart Failure Zone chart for action plan for heart failure.  Written material given at graduation. Flowsheet Row Pulmonary Rehab from 09/12/2022 in [facility] Cardiac and Pulmonary Rehab  Education need identified 08/06/22  Date 09/12/22  Educator MS  Instruction Review Code 1- Verbalizes Understanding       Core Components/Risk Factors/Patient Goals Review:    Goals and Risk Factor Review     Row Name 09/12/22 1745 10/11/22 1741           Core Components/Risk Factors/Patient Goals Review   Personal Goals Review Weight Management/Obesity;Tobacco Cessation Improve shortness of breath with ADL's      Review Patient reports that her weight has gone up. She is continually dealing with fluid retention. She does not have a scale at home and she was encouraged to buy one to monitor weight. She continues to smoke 2-3 cigarettes a day and does not currently have a quit date. she does take all medications as prescribed. Spoke to patient about their shortness of breath and what they can do to improve. Patient has been informed of breathing techniques when starting the program. Patient is informed to tell staff if they have had any med changes and that certain meds they are taking or not taking can be causing shortness of breath. Informed patient that they can take rest breaks when they get short of breath, regain breath and resume exercise.      Expected Outcomes Short: buy and scale and begin to monitor weight at home on a daily basis. Long: work on quitting smoking. Short: Attend [facility] regularly to improve shortness of breath with ADL's. Long: maintain independence with ADL's               Core Components/Risk Factors/Patient Goals at Discharge (Final Review):   Goals and Risk Factor Review - 10/11/22 1741       Core Components/Risk Factors/Patient Goals Review   Personal Goals Review Improve shortness of breath with ADL's    Review Spoke to patient about their shortness of breath and what they can do to improve. Patient has been informed of breathing techniques when starting the program. Patient is informed to tell staff if they have had any med changes and that  certain meds they are taking or not taking can be causing shortness of breath. Informed patient that they can take rest breaks when they get short of breath, regain breath and resume exercise.     Expected Outcomes Short: Attend [facility] regularly to improve shortness of breath with ADL's. Long: maintain independence with ADL's             ITP Comments:  ITP Comments     Row Name 07/26/22 1350 08/06/22 1028 08/13/22 1721 08/22/22 1402 09/19/22 0939   ITP Comments Initial phone call completed. Diagnosis can be found in [facility] 12[facility]. EP Orientation scheduled for Monday 4/1 at 9am.       [name] is a current tobacco user. Intervention for tobacco cessation was provided at the initial medical review. She was asked about readiness to quit and reported she is ready to quit . Patient was advised and educated about tobacco cessation using combination therapy, tobacco cessation classes, quit line, and quit smoking apps. Patient demonstrated understanding of this material. Staff will continue to provide encouragement and follow up with the patient throughout the program. Completed and gym orientation. Initial ITP created and sent for review to Dr. Vida Rigger, Medical Director. First full day of exercise!  Patient was oriented to gym and equipment including functions, settings, policies, and procedures.  Patient's individual exercise prescription and treatment plan were reviewed.  All starting workloads were established based on the results of the 6 minute walk test done at initial orientation visit.  The plan for exercise progression was also introduced and progression will be customized based on patient's performance and goals. 30 day review completed. ITP sent to Dr. Jinny Sanders, Medical Director of  Pulmonary Rehab. Continue with ITP unless changes are made by physician. Pt still new to program. 30 Day review completed. Medical Director ITP review done, changes made as directed, and signed approval by Medical Director.    Row Name 10/17/22 1130           ITP Comments 30 Day review completed. Medical Director ITP review done, changes made as directed, and signed approval by Medical  Director.                Comments:

## 2022-10-18 ENCOUNTER — Encounter: Payer: BC Managed Care – PPO | Admitting: *Deleted

## 2022-10-18 DIAGNOSIS — J449 Chronic obstructive pulmonary disease, unspecified: Secondary | ICD-10-CM | POA: Diagnosis not present

## 2022-10-18 DIAGNOSIS — R0602 Shortness of breath: Secondary | ICD-10-CM

## 2022-10-18 NOTE — Progress Notes (Signed)
Daily Session Note  Patient Details  Name: [name] MRN: [id number] Date of Birth: 1955-10-29 Referring Provider:   Flowsheet Row Pulmonary Rehab from 08/06/2022 in [facility] Cardiac and Pulmonary Rehab  Referring Provider Dr. Sarina Ser, MD       Encounter Date: 10/18/2022  Check In:  Session Check In - 10/18/22 1713       Check-In   Supervising physician immediately available to respond to emergencies See telemetry face sheet for immediately available ER MD    Location [name]    Staff Present [name], RN BSN;Joseph [place], RCP,RRT,BSRT;Noah Tickle, [place], Exercise Physiologist    Virtual Visit No    Medication changes reported     No    Fall or balance concerns reported    No    Tobacco Cessation No Change    Current number of cigarettes/nicotine per day     4    Warm-up and Cool-down Performed on first and last piece of equipment    Resistance Training Performed Yes    VAD Patient? No    PAD/SET Patient? No      Pain Assessment   Currently in Pain? No/denies                Social History   Tobacco Use  Smoking Status Every Day   Packs/day: 1.50   Years: 39.00   Additional pack years: 0.00   Total pack years: 58.50   Types: Cigarettes  Smokeless Tobacco Never  Tobacco Comments   2 cigarettes a day- 08/06/2022 khj    Goals Met:  Independence with exercise equipment Exercise tolerated well No report of concerns or symptoms today Strength training completed today  Goals Unmet:  Not Applicable  Comments: Pt able to follow exercise prescription today without complaint.  Will continue to monitor for progression.    Dr. Bethann Punches is Medical Director for [facility] Cardiac Rehabilitation.  Dr. Vida Rigger is Medical Director for [facility] Pulmonary Rehabilitation.

## 2022-10-22 ENCOUNTER — Encounter: Payer: BC Managed Care – PPO | Admitting: *Deleted

## 2022-10-22 DIAGNOSIS — R0602 Shortness of breath: Secondary | ICD-10-CM

## 2022-10-22 DIAGNOSIS — J449 Chronic obstructive pulmonary disease, unspecified: Secondary | ICD-10-CM | POA: Diagnosis not present

## 2022-10-22 NOTE — Progress Notes (Signed)
Daily Session Note  Patient Details  Name: [name] MRN: [id number] Date of Birth: 02/17/1956 Referring Provider:   Flowsheet Row Pulmonary Rehab from 08/06/2022 in [facility] Cardiac and Pulmonary Rehab  Referring Provider Dr. Sarina Ser, MD       Encounter Date: 10/22/2022  Check In:  Session Check In - 10/22/22 1719       Check-In   Supervising physician immediately available to respond to emergencies See telemetry face sheet for immediately available ER MD    Location [name]    Staff Present [name], RN BSN;Noah Tickle, BS, Exercise Physiologist;Joseph Reino [place], [place]    Virtual Visit No    Medication changes reported     No    Fall or balance concerns reported    No    Tobacco Cessation No Change    Current number of cigarettes/nicotine per day     4    Warm-up and Cool-down Performed on first and last piece of equipment    Resistance Training Performed Yes    VAD Patient? No    PAD/SET Patient? No      Pain Assessment   Currently in Pain? No/denies                Social History   Tobacco Use  Smoking Status Every Day   Packs/day: 1.50   Years: 39.00   Additional pack years: 0.00   Total pack years: 58.50   Types: Cigarettes  Smokeless Tobacco Never  Tobacco Comments   2 cigarettes a day- 08/06/2022 khj    Goals Met:  Independence with exercise equipment Exercise tolerated well No report of concerns or symptoms today Strength training completed today  Goals Unmet:  Not Applicable  Comments: Pt able to follow exercise prescription today without complaint.  Will continue to monitor for progression.    Dr. Bethann Punches is Medical Director for [facility] Cardiac Rehabilitation.  Dr. Vida Rigger is Medical Director for [facility] Pulmonary Rehabilitation.

## 2022-10-23 ENCOUNTER — Telehealth (HOSPITAL_BASED_OUTPATIENT_CLINIC_OR_DEPARTMENT_OTHER): Payer: BC Managed Care – PPO | Admitting: Pulmonary Disease

## 2022-10-23 DIAGNOSIS — G4733 Obstructive sleep apnea (adult) (pediatric): Secondary | ICD-10-CM | POA: Diagnosis not present

## 2022-10-23 NOTE — Telephone Encounter (Signed)
Patient is aware of results and voiced her understanding.  She agrees with oxygen. Order has been placed.  Nothing further needed 

## 2022-10-23 NOTE — Telephone Encounter (Signed)
Surprisingly, no sig OSA on this study Needed 1 L O2 Not sure how to reconcile with previous HST showing severe oSA Not much supine sleep on this study

## 2022-10-23 NOTE — Addendum Note (Signed)
Addended by: Lajoyce Lauber A on: 10/23/2022 01:39 PM   Modules accepted: Orders

## 2022-10-23 NOTE — Telephone Encounter (Signed)
Her most recent sleep study does not show sleep apnea which is good news.  She does need however oxygen at 1 to 2 L/min nocturnally.

## 2022-10-23 NOTE — Addendum Note (Signed)
Addended by: Lajoyce Lauber A on: 10/23/2022 01:41 PM   Modules accepted: Orders

## 2022-10-25 ENCOUNTER — Ambulatory Visit: Payer: BC Managed Care – PPO | Attending: Internal Medicine | Admitting: Internal Medicine

## 2022-10-25 ENCOUNTER — Encounter: Payer: Self-pay | Admitting: Internal Medicine

## 2022-10-25 ENCOUNTER — Other Ambulatory Visit: Payer: Self-pay

## 2022-10-25 ENCOUNTER — Encounter: Payer: BC Managed Care – PPO | Admitting: *Deleted

## 2022-10-25 VITALS — BP 116/80 | HR 63 | Ht 65.0 in | Wt 214.0 lb

## 2022-10-25 DIAGNOSIS — I5032 Chronic diastolic (congestive) heart failure: Secondary | ICD-10-CM

## 2022-10-25 DIAGNOSIS — I480 Paroxysmal atrial fibrillation: Secondary | ICD-10-CM

## 2022-10-25 DIAGNOSIS — J449 Chronic obstructive pulmonary disease, unspecified: Secondary | ICD-10-CM | POA: Diagnosis not present

## 2022-10-25 DIAGNOSIS — R0602 Shortness of breath: Secondary | ICD-10-CM

## 2022-10-25 NOTE — Progress Notes (Signed)
Daily Session Note  Patient Details  Name: [name] MRN: [id number] Date of Birth: December 16, 1955 Referring Provider:   Flowsheet Row Pulmonary Rehab from 08/06/2022 in [facility] Cardiac and Pulmonary Rehab  Referring Provider Dr. Sarina Ser, MD       Encounter Date: 10/25/2022  Check In:  Session Check In - 10/25/22 1713       Check-In   Supervising physician immediately available to respond to emergencies See telemetry face sheet for immediately available ER MD    Location [name]    Staff Present [name], RN BSN;Noah Tickle, BS, Exercise Physiologist;Megan Katrinka Blazing, RN, ADN    Virtual Visit No    Medication changes reported     No    Fall or balance concerns reported    No    Tobacco Cessation No Change    Current number of cigarettes/nicotine per day     4    Warm-up and Cool-down Performed on first and last piece of equipment    Resistance Training Performed Yes    VAD Patient? No    PAD/SET Patient? No      Pain Assessment   Currently in Pain? No/denies                Social History   Tobacco Use  Smoking Status Every Day   Packs/day: 1.50   Years: 39.00   Additional pack years: 0.00   Total pack years: 58.50   Types: Cigarettes  Smokeless Tobacco Never  Tobacco Comments   2 cigarettes a day- 08/06/2022 khj    Goals Met:  Independence with exercise equipment Exercise tolerated well No report of concerns or symptoms today Strength training completed today  Goals Unmet:  Not Applicable  Comments: Pt able to follow exercise prescription today without complaint.  Will continue to monitor for progression.    Dr. Bethann Punches is Medical Director for [facility] Cardiac Rehabilitation.  Dr. Vida Rigger is Medical Director for [facility] Pulmonary Rehabilitation.

## 2022-10-25 NOTE — Patient Instructions (Addendum)
Medication Instructions:  Patient informed to take furosemide daily for 5 days per Dr Graciela Husbands, then resume as prescribed  Your physician recommends that you continue on your current medications as directed. Please refer to the Current Medication list given to you today. *If you need a refill on your cardiac medications before your next appointment, please call your pharmacy*  Follow-Up: At [facility], you and your health needs are our priority.  As part of our continuing mission to provide you with exceptional heart care, we have created designated Provider Care Teams.  These Care Teams include your primary Cardiologist (physician) and Advanced Practice Providers (APPs -  Physician Assistants and Nurse Practitioners) who all work together to provide you with the care you need, when you need it.  We recommend signing up for the patient portal called "MyChart".  Sign up information is provided on this After Visit Summary.  MyChart is used to connect with patients for Virtual Visits (Telemedicine).  Patients are able to view lab/test results, encounter notes, upcoming appointments, etc.  Non-urgent messages can be sent to your provider as well.   To learn more about what you can do with MyChart, go to [email or web address].    Your next appointment:   1 year(s)  Provider:   Sherryl Manges, MD

## 2022-10-25 NOTE — Progress Notes (Signed)
ELECTROPHYSIOLOGY OFFICE NOTE  [name], MRN: [id number], DOB/AGE: 1956-02-20 67 y.o. Admit date: (Not on file) Date of Consult: 10/25/2022  Primary Physician: Dorcas Carrow, DO Primary Cardiologist: sk        HPI  [name] is a 67 y.o. female seen follow-up  for undocumented atrial fibrillation with history dates back to the 90s (in her 10s).  She has a strong family history of early onset atrial fibrillation.     Interestingly, her episodes can be sometimes terminated in the first couple of minutes with Valsalva or cough.  If not then may seem to have a change in character as she developed tunnel vision and lightheadedness.  When initially seen 2017  given the infrequency of events, we had elected to go from propafenone daily--propafenone as needed.     With complaints of dyspnea, undertook PFTs.  "Mildly abnormal" c seen subsequently by Dr. Jayme Cloud.     Breathing has improved initially with diuretics.  Now has an inhaler.   .  Sleep study was negative, but as of 6/24 on nocturnal oxygen  has followup with Dr Shelle Iron 12/06/22  Occasional palpitations.  Dyspnea has been stable.  No chest pain.  Some peripheral edema.  Diet is not really salt replete but she is fluid replete.  Taking her diuretics irregularly   DATE TEST EF   3/09 Echo   65 %   5/14 Myoview   75 % No ischemia  8/23 Echo  60-65%    Date Cr K TSH LDL Hgb  7//21 1.12 4.1 2.55 179 15.3  9/22 0.81 4.4 1.88 82 15.4  11/23 1.04 4.7 3.38 105 15.4  5/24 1.02 4.3 4.45  14.9    Thromboembolic risk factors (age-86, HTN-1, Gender-1) for a CHADSVASc Score of 2   Past Medical History:  Diagnosis Date   Anxiety state, unspecified    Back pain    Chest pain    a. 2008 Cath: reportedly nl;  b. 09/2012 Lexi MV: EF 75%, soft tissue attenuation->Low risk.   COPD (chronic obstructive pulmonary disease) (HCC)    CTS (carpal tunnel syndrome)    Depression    GERD (gastroesophageal reflux disease)     Migraines    Neck pain    Nontoxic multinodular goiter    Other and unspecified hyperlipidemia    Other and unspecified ovarian cyst    left   Other chest pain    PAF (paroxysmal atrial fibrillation) (HCC)    a. 07/2007 Echo: EF 65%, mildly dil LA;  b. currently on propafenone;  c. CHA2DS2VASc = 1 (gender)-->No anticoagulation.   PTSD (post-traumatic stress disorder)    Tobacco use    Tobacco use disorder    Unspecified hypothyroidism    Unspecified sleep apnea    resolved s/p UPPP surgery   Vitamin B12 deficiency       Surgical History:  Past Surgical History:  Procedure Laterality Date   bladder tac     CARDIAC CATHETERIZATION  08/12/06   neg. Dr. Welton Flakes, cardio   CHOLECYSTECTOMY     FOOT SURGERY     bone removal from her foot   MRI lumber spine     03/12/06-abn   MRI pelvis  05/02/06   abn-[place], neurosurg   NASAL POLYP SURGERY     nasal ?   PARATHYROIDECTOMY     stress cardiolite  10/06/96   nl, EF 62%   stress myoview  10/25/03  Dr. Park Breed, nl   THYROIDECTOMY  5/92   massive goiter   TONSILLECTOMY     TUBAL LIGATION  1988   UVULOPALATOPHARYNGOPLASTY     and tonsilectomy. [name] 10/04     Home Meds: Current Meds  Medication Sig   albuterol (VENTOLIN HFA) 108 (90 Base) MCG/ACT inhaler Inhale 2 puffs into the lungs every 6 (six) hours as needed for wheezing or shortness of breath.   atorvastatin (LIPITOR) 40 MG tablet Take 1 tablet (40 mg total) by mouth daily.   citalopram (CELEXA) 40 MG tablet Take 1.5 tablets (60 mg total) by mouth daily.   furosemide (LASIX) 20 MG tablet Take 1 tablet (20 mg total) by mouth 3 (three) times a week.   levothyroxine (SYNTHROID) 125 MCG tablet Take 1 tablet (125 mcg total) by mouth daily with breakfast.   pantoprazole (PROTONIX) 20 MG tablet Take 1 tablet (20 mg total) by mouth daily.   propafenone (RYTHMOL) 225 MG tablet TAKE 1 TABLET BY MOUTH DAILY AS NEEDED FOR AFIB   Spacer/Aero-Holding Chambers DEVI 1 Device by Does not  apply route as needed.   Tiotropium Bromide-Olodaterol (STIOLTO RESPIMAT) 2.5-2.5 MCG/ACT AERS Inhale 2 puffs into the lungs daily.    Allergies:  Allergies  Allergen Reactions   Bee Venom Anaphylaxis   Penicillins Rash   Sulfonamide Derivatives Rash   Cardizem  [Diltiazem Hcl] Other (See Comments)    Other Reaction: severe hypotension w/ IV dose   Sulfa Antibiotics Hives   Tape Rash    Uncoded Allergy. Allergen: tape & bandaids          ROS:  Please see the history of present illness.     All other systems reviewed and negative.   BP 116/80   Pulse 63   Ht 5\' 5"  (1.651 m)   Wt 214 lb (97.1 kg)   SpO2 92%   BMI 35.61 kg/m   Well developed and nourished in no acute distress HENT normal Neck supple with JVP-  flat   Clear Regular rate and rhythm, no murmurs or gallops Abd-soft with active BS No Clubbing cyanosis 1-2+ edema Skin-warm and dry A & Oriented  Grossly normal sensory and motor function  ECG sinus at 63 Interval 15/09/46 Left axis deviation  Assessment and Plan:   Atrial fibrillation paroxysmal/regular tachypalpitations  Elevated blood pressure/orthostatic lightheadedness  Hypercholesterolemia  Dyspnea on exertion/HFpEF  Dyspnea with volume overload.  Will increase her diuretic from q. OD--daily x 5 days discussed the importance of fluid restriction  Blood pressures are well-controlled.  Some episodes of lightheadedness with standing.  Will be a challenge to navigate between a rock and a hard place of low blood pressure with standing and volume overload.  Palpitations remain relatively quiescent.  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  Lymphocytosis-resolved   Infrequent interval palpitations.  Question mechanism.  Breathing is better.  Have encouraged her to take her diuretics 3 times a week.  Evaluation from Dr. Jayme Cloud included the concerns of weight and look into whether the patient is a candidate  for cardiopulmonary rehab for exercise   Sherryl Manges

## 2022-10-26 ENCOUNTER — Telehealth: Payer: Self-pay | Admitting: Pulmonary Disease

## 2022-10-26 NOTE — Telephone Encounter (Signed)
Patient states needs order for CPAP machine. Patient would like to use [facility]. Patient phone number is [phone].

## 2022-10-29 ENCOUNTER — Encounter: Payer: BC Managed Care – PPO | Admitting: *Deleted

## 2022-10-29 DIAGNOSIS — J449 Chronic obstructive pulmonary disease, unspecified: Secondary | ICD-10-CM

## 2022-10-29 NOTE — Telephone Encounter (Signed)
Spoke to patient. She is aware that she does not qualify for cpap machine based off of last sleep study.  Appt scheduled 12/06/2022 Nothing further needed.

## 2022-10-29 NOTE — Progress Notes (Signed)
Daily Session Note  Patient Details  Name: [name] MRN: [id number] Date of Birth: 08-25-1955 Referring Provider:   Flowsheet Row Pulmonary Rehab from 08/06/2022 in [facility] Cardiac and Pulmonary Rehab  Referring Provider Dr. Sarina Ser, MD       Encounter Date: 10/29/2022  Check In:  Session Check In - 10/29/22 1722       Check-In   Supervising physician immediately available to respond to emergencies See telemetry face sheet for immediately available ER MD    Location [name]    Staff Present [name], RN BSN;Noah Tickle, BS, Exercise Physiologist;Laureen Manson Passey, BS, RRT, CPFT    Virtual Visit No    Medication changes reported     No    Fall or balance concerns reported    No    Tobacco Cessation No Change    Current number of cigarettes/nicotine per day     4    Warm-up and Cool-down Performed on first and last piece of equipment    Resistance Training Performed Yes    VAD Patient? No    PAD/SET Patient? No      Pain Assessment   Currently in Pain? No/denies                Social History   Tobacco Use  Smoking Status Every Day   Packs/day: 1.50   Years: 39.00   Additional pack years: 0.00   Total pack years: 58.50   Types: Cigarettes  Smokeless Tobacco Never  Tobacco Comments   2 cigarettes a day- 08/06/2022 khj    Goals Met:  Independence with exercise equipment Exercise tolerated well No report of concerns or symptoms today Strength training completed today  Goals Unmet:  Not Applicable  Comments: Pt able to follow exercise prescription today without complaint.  Will continue to monitor for progression.    Dr. Bethann Punches is Medical Director for [facility] Cardiac Rehabilitation.  Dr. Vida Rigger is Medical Director for [facility] Pulmonary Rehabilitation.

## 2022-11-01 ENCOUNTER — Encounter: Payer: BC Managed Care – PPO | Admitting: *Deleted

## 2022-11-01 DIAGNOSIS — R0602 Shortness of breath: Secondary | ICD-10-CM

## 2022-11-01 DIAGNOSIS — J449 Chronic obstructive pulmonary disease, unspecified: Secondary | ICD-10-CM | POA: Diagnosis not present

## 2022-11-01 NOTE — Progress Notes (Signed)
Daily Session Note  Patient Details  Name: [name] MRN: [id number] Date of Birth: 12-28-1955 Referring Provider:   Flowsheet Row Pulmonary Rehab from 08/06/2022 in [facility] Cardiac and Pulmonary Rehab  Referring Provider Dr. Sarina Ser, MD       Encounter Date: 11/01/2022  Check In:  Session Check In - 11/01/22 1717       Check-In   Supervising physician immediately available to respond to emergencies See telemetry face sheet for immediately available ER MD    Location [name]    Staff Present [name], RN BSN;Joseph [place], RCP,RRT,BSRT;Noah [place], [place], Exercise Physiologist    Virtual Visit No    Medication changes reported     No    Fall or balance concerns reported    No    Tobacco Cessation Use Decreased    Current number of cigarettes/nicotine per day     2    Warm-up and Cool-down Performed on first and last piece of equipment    Resistance Training Performed Yes    VAD Patient? No    PAD/SET Patient? No      Pain Assessment   Currently in Pain? No/denies                Social History   Tobacco Use  Smoking Status Every Day   Packs/day: 1.50   Years: 39.00   Additional pack years: 0.00   Total pack years: 58.50   Types: Cigarettes  Smokeless Tobacco Never  Tobacco Comments   2 cigarettes a day- 08/06/2022 khj    Goals Met:  Independence with exercise equipment Exercise tolerated well No report of concerns or symptoms today Strength training completed today  Goals Unmet:  Not Applicable  Comments: Pt able to follow exercise prescription today without complaint.  Will continue to monitor for progression.    Dr. Bethann Punches is Medical Director for [facility] Cardiac Rehabilitation.  Dr. Vida Rigger is Medical Director for [facility] Pulmonary Rehabilitation.

## 2022-11-05 ENCOUNTER — Encounter: Payer: BC Managed Care – PPO | Attending: Pulmonary Disease | Admitting: *Deleted

## 2022-11-05 DIAGNOSIS — R0602 Shortness of breath: Secondary | ICD-10-CM | POA: Diagnosis present

## 2022-11-05 DIAGNOSIS — J449 Chronic obstructive pulmonary disease, unspecified: Secondary | ICD-10-CM | POA: Diagnosis present

## 2022-11-05 NOTE — Progress Notes (Signed)
Daily Session Note  Patient Details  Name: [name] MRN: [id number] Date of Birth: May 18, 1955 Referring Provider:   Flowsheet Row Pulmonary Rehab from 08/06/2022 in [facility]  Referring Provider Dr. Sarina Ser, MD       Encounter Date: 11/05/2022  Check In:  Session Check In - 11/05/22 1710       Check-In   Supervising physician immediately available to respond to emergencies See telemetry face sheet for immediately available ER MD    Location [name]    Staff Present [name], RN BSN;Joseph Firestone, RCP,RRT,BSRT;Kelly [place], [place], ACSM CEP, Exercise Physiologist    Virtual Visit No    Medication changes reported     No    Fall or balance concerns reported    No    Tobacco Cessation No Change    Current number of cigarettes/nicotine per day     2    Warm-up and Cool-down Performed on first and last piece of equipment    Resistance Training Performed Yes    VAD Patient? No    PAD/SET Patient? No      Pain Assessment   Currently in Pain? No/denies                Social History   Tobacco Use  Smoking Status Every Day   Packs/day: 1.50   Years: 39.00   Additional pack years: 0.00   Total pack years: 58.50   Types: Cigarettes  Smokeless Tobacco Never  Tobacco Comments   2 cigarettes a day- 08/06/2022 khj    Goals Met:  Independence with exercise equipment Exercise tolerated well No report of concerns or symptoms today Strength training completed today  Goals Unmet:  Not Applicable  Comments: Pt able to follow exercise prescription today without complaint.  Will continue to monitor for progression.    Dr. Bethann Punches is Medical Director for [facility].  Dr. Vida Rigger is Medical Director for [facility].

## 2022-11-12 ENCOUNTER — Encounter: Payer: BC Managed Care – PPO | Admitting: *Deleted

## 2022-11-12 DIAGNOSIS — R0602 Shortness of breath: Secondary | ICD-10-CM

## 2022-11-12 DIAGNOSIS — J449 Chronic obstructive pulmonary disease, unspecified: Secondary | ICD-10-CM | POA: Diagnosis not present

## 2022-11-12 NOTE — Progress Notes (Signed)
Incomplete Session Note   [name] "[name]" did not complete her session today. She did not feel well. Vital signs were stable. She had worked hard today before coming to exercise. She plans to return on Wednesday.

## 2022-11-13 ENCOUNTER — Encounter: Payer: Self-pay | Admitting: *Deleted

## 2022-11-13 DIAGNOSIS — R0602 Shortness of breath: Secondary | ICD-10-CM

## 2022-11-13 DIAGNOSIS — J449 Chronic obstructive pulmonary disease, unspecified: Secondary | ICD-10-CM

## 2022-11-13 NOTE — Progress Notes (Signed)
Pulmonary Individual Treatment Plan  Patient Details  Name: [name] MRN: [id number] Date of Birth: 09-30-1955 Referring Provider:   Flowsheet Row Pulmonary Rehab from 08/06/2022 in [facility] Cardiac and Pulmonary Rehab  Referring Provider Dr. Sarina Ser, MD       Initial Encounter Date:  Flowsheet Row Pulmonary Rehab from 08/06/2022 in [facility] Cardiac and Pulmonary Rehab  Date 08/06/22       Visit Diagnosis: Chronic obstructive pulmonary disease, unspecified COPD type (HCC)  Shortness of breath  Patient's Home Medications on Admission:  Current Outpatient Medications:    albuterol (VENTOLIN HFA) 108 (90 Base) MCG/ACT inhaler, Inhale 2 puffs into the lungs every 6 (six) hours as needed for wheezing or shortness of breath., Disp: 8 g, Rfl: 2   atorvastatin (LIPITOR) 40 MG tablet, Take 1 tablet (40 mg total) by mouth daily., Disp: 90 tablet, Rfl: 1   citalopram (CELEXA) 40 MG tablet, Take 1.5 tablets (60 mg total) by mouth daily., Disp: 135 tablet, Rfl: 1   furosemide (LASIX) 20 MG tablet, Take 1 tablet (20 mg total) by mouth 3 (three) times a week., Disp: 45 tablet, Rfl: 3   levothyroxine (SYNTHROID) 125 MCG tablet, Take 1 tablet (125 mcg total) by mouth daily with breakfast., Disp: 90 tablet, Rfl: 3   pantoprazole (PROTONIX) 20 MG tablet, Take 1 tablet (20 mg total) by mouth daily., Disp: 90 tablet, Rfl: 3   propafenone (RYTHMOL) 225 MG tablet, TAKE 1 TABLET BY MOUTH DAILY AS NEEDED FOR AFIB, Disp: 30 tablet, Rfl: 1   Spacer/Aero-Holding Chambers DEVI, 1 Device by Does not apply route as needed., Disp: 2 each, Rfl: 0   Tiotropium Bromide-Olodaterol (STIOLTO RESPIMAT) 2.5-2.5 MCG/ACT AERS, Inhale 2 puffs into the lungs daily., Disp: 4 g, Rfl: 5 No current facility-administered medications for this visit.  Facility-Administered Medications Ordered in Other Visits:    albuterol (PROVENTIL) (2.5 MG/3ML) 0.083% nebulizer solution 2.5 mg, 2.5 mg, Nebulization, Once, Duke Salvia,  MD  Past Medical History: Past Medical History:  Diagnosis Date   Anxiety state, unspecified    Back pain    Chest pain    a. 2008 Cath: reportedly nl;  b. 09/2012 Lexi MV: EF 75%, soft tissue attenuation->Low risk.   COPD (chronic obstructive pulmonary disease) (HCC)    CTS (carpal tunnel syndrome)    Depression    GERD (gastroesophageal reflux disease)    Migraines    Neck pain    Nontoxic multinodular goiter    Other and unspecified hyperlipidemia    Other and unspecified ovarian cyst    left   Other chest pain    PAF (paroxysmal atrial fibrillation) (HCC)    a. 07/2007 Echo: EF 65%, mildly dil LA;  b. currently on propafenone;  c. CHA2DS2VASc = 1 (gender)-->No anticoagulation.   PTSD (post-traumatic stress disorder)    Tobacco use    Tobacco use disorder    Unspecified hypothyroidism    Unspecified sleep apnea    resolved s/p UPPP surgery   Vitamin B12 deficiency     Tobacco Use: Social History   Tobacco Use  Smoking Status Every Day   Packs/day: 1.50   Years: 39.00   Additional pack years: 0.00   Total pack years: 58.50   Types: Cigarettes  Smokeless Tobacco Never  Tobacco Comments   2 cigarettes a day- 08/06/2022 khj    Labs: Review Flowsheet  More data exists      Latest Ref Rng & Units 10/09/2017 11/20/2019 01/30/2021 03/26/2022 09/24/2022  Labs for ITP Cardiac and Pulmonary Rehab  Cholestrol 100 - 199 mg/dL 161  096  045  409  811   LDL (calc) 0 - 99 mg/dL 914  782  82  956  213   HDL-C >39 mg/dL 45  41  39  55  45   Trlycerides 0 - 149 mg/dL 086  578  469  629  528   Hemoglobin A1c 4.8 - 5.6 % - - 5.5  - -     Pulmonary Assessment Scores:  Pulmonary Assessment Scores     Row Name 08/06/22 1034         ADL UCSD   ADL Phase Entry     SOB Score total 39     Rest 0     Walk 3     Stairs 5     Bath 1     Dress 1     Shop 1       CAT Score   CAT Score 8       mMRC Score   mMRC Score 1              UCSD: Self-administered rating of  dyspnea associated with activities of daily living (ADLs) 6-point scale (0 = "not at all" to 5 = "maximal or unable to do because of breathlessness")  Scoring Scores range from 0 to 120.  Minimally important difference is 5 units  CAT: CAT can identify the health impairment of COPD patients and is better correlated with disease progression.  CAT has a scoring range of zero to 40. The CAT score is classified into four groups of low (less than 10), medium (10 - 20), high (21-30) and very high (31-40) based on the impact level of disease on health status. A CAT score over 10 suggests significant symptoms.  A worsening CAT score could be explained by an exacerbation, poor medication adherence, poor inhaler technique, or progression of COPD or comorbid conditions.  CAT MCID is 2 points  mMRC: mMRC (Modified Medical Research Council) Dyspnea Scale is used to assess the degree of baseline functional disability in patients of respiratory disease due to dyspnea. No minimal important difference is established. A decrease in score of 1 point or greater is considered a positive change.   Pulmonary Function Assessment:   Exercise Target Goals: Exercise Program Goal: Individual exercise prescription set using results from initial 6 min walk test and THRR while considering  patient's activity barriers and safety.   Exercise Prescription Goal: Initial exercise prescription builds to 30-45 minutes a day of aerobic activity, 2-3 days per week.  Home exercise guidelines will be given to patient during program as part of exercise prescription that the participant will acknowledge.  Education: Aerobic Exercise: - Group verbal and visual presentation on the components of exercise prescription. Introduces F.I.T.T principle from ACSM for exercise prescriptions.  Reviews F.I.T.T. principles of aerobic exercise including progression. Written material given at graduation. Flowsheet Row Pulmonary Rehab from 09/12/2022 in  [facility] Cardiac and Pulmonary Rehab  Education need identified 08/06/22       Education: Resistance Exercise: - Group verbal and visual presentation on the components of exercise prescription. Introduces F.I.T.T principle from ACSM for exercise prescriptions  Reviews F.I.T.T. principles of resistance exercise including progression. Written material given at graduation.    Education: Exercise & Equipment Safety: - Individual verbal instruction and demonstration of equipment use and safety with use of the equipment. Flowsheet Row Pulmonary Rehab from 09/12/2022 in [facility]  Cardiac and Pulmonary Rehab  Date 08/06/22  Educator NT  Instruction Review Code 1- Verbalizes Understanding       Education: Exercise Physiology & General Exercise Guidelines: - Group verbal and written instruction with models to review the exercise physiology of the cardiovascular system and associated critical values. Provides general exercise guidelines with specific guidelines to those with heart or lung disease.    Education: Flexibility, Balance, Mind/Body Relaxation: - Group verbal and visual presentation with interactive activity on the components of exercise prescription. Introduces F.I.T.T principle from ACSM for exercise prescriptions. Reviews F.I.T.T. principles of flexibility and balance exercise training including progression. Also discusses the mind body connection.  Reviews various relaxation techniques to help reduce and manage stress (i.e. Deep breathing, progressive muscle relaxation, and visualization). Balance handout provided to take home. Written material given at graduation.   Activity Barriers & Risk Stratification:  Activity Barriers & Cardiac Risk Stratification - 08/06/22 1050       Activity Barriers & Cardiac Risk Stratification   Activity Barriers Back Problems;Shortness of Breath;Joint Problems             6 Minute Walk:  6 Minute Walk     Row Name 08/06/22 1043         6 Minute  Walk   Phase Initial     Distance 1280 feet     Walk Time 6 minutes     # of Rest Breaks 0     MPH 2.42     METS 2.86     RPE 11     Perceived Dyspnea  1     VO2 Peak 10.01     Symptoms Yes (comment)     Comments Chronic Back Pain 9/10     Resting HR 68 bpm     Resting BP 126/74     Resting Oxygen Saturation  98 %     Exercise Oxygen Saturation  during 6 min walk 93 %     Max Ex. HR 105 bpm     Max Ex. BP 160/80     2 Minute Post BP 144/78       Interval HR   1 Minute HR 88     2 Minute HR 97     3 Minute HR 97     4 Minute HR 102     5 Minute HR 104     6 Minute HR 105     2 Minute Post HR 70     Interval Heart Rate? Yes       Interval Oxygen   Interval Oxygen? Yes     Baseline Oxygen Saturation % 98 %     1 Minute Oxygen Saturation % 93 %     1 Minute Liters of Oxygen 0 L     2 Minute Oxygen Saturation % 94 %     2 Minute Liters of Oxygen 0 L     3 Minute Oxygen Saturation % 94 %     3 Minute Liters of Oxygen 0 L     4 Minute Oxygen Saturation % 94 %     4 Minute Liters of Oxygen 0 L     5 Minute Oxygen Saturation % 93 %     5 Minute Liters of Oxygen 0 L     6 Minute Oxygen Saturation % 95 %     6 Minute Liters of Oxygen 0 L     2 Minute Post Oxygen Saturation % 96 %  2 Minute Post Liters of Oxygen 0 L             Oxygen Initial Assessment:  Oxygen Initial Assessment - 07/26/22 1333       Home Oxygen   Home Oxygen Device None    Sleep Oxygen Prescription None    Home Exercise Oxygen Prescription None    Home Resting Oxygen Prescription None    Compliance with Home Oxygen Use Yes      Initial 6 min Walk   Oxygen Used None      Program Oxygen Prescription   Program Oxygen Prescription None      Intervention   Short Term Goals To learn and understand importance of monitoring SPO2 with pulse oximeter and demonstrate accurate use of the pulse oximeter.;To learn and understand importance of maintaining oxygen saturations>88%;To learn and  demonstrate proper pursed lip breathing techniques or other breathing techniques. ;To learn and demonstrate proper use of respiratory medications    Long  Term Goals Verbalizes importance of monitoring SPO2 with pulse oximeter and return demonstration;Maintenance of O2 saturations>88%;Exhibits proper breathing techniques, such as pursed lip breathing or other method taught during program session;Compliance with respiratory medication;Demonstrates proper use of MDI's             Oxygen Re-Evaluation:  Oxygen Re-Evaluation     Row Name 08/13/22 1723 09/12/22 1731 10/11/22 1739         Program Oxygen Prescription   Program Oxygen Prescription -- None None       Home Oxygen   Home Oxygen Device -- None None     Sleep Oxygen Prescription -- CPAP CPAP     Home Exercise Oxygen Prescription -- None None     Home Resting Oxygen Prescription -- None None     Compliance with Home Oxygen Use -- Yes Yes       Goals/Expected Outcomes   Short Term Goals -- To learn and understand importance of monitoring SPO2 with pulse oximeter and demonstrate accurate use of the pulse oximeter.;To learn and understand importance of maintaining oxygen saturations>88%;To learn and demonstrate proper pursed lip breathing techniques or other breathing techniques. ;To learn and demonstrate proper use of respiratory medications To learn and understand importance of maintaining oxygen saturations>88%;To learn and understand importance of monitoring SPO2 with pulse oximeter and demonstrate accurate use of the pulse oximeter.     Long  Term Goals -- Verbalizes importance of monitoring SPO2 with pulse oximeter and return demonstration;Maintenance of O2 saturations>88%;Exhibits proper breathing techniques, such as pursed lip breathing or other method taught during program session;Compliance with respiratory medication;Demonstrates proper use of MDI's Maintenance of O2 saturations>88%;Verbalizes importance of monitoring SPO2 with  pulse oximeter and return demonstration     Comments Reviewed PLB technique with pt.  Talked about how it works and it's importance in maintaining their exercise saturations. Patient reported that she uses her CPAP as consistent as she can at home. She aslo reports that she feels comfortable using PLB when she has shortness of breath. Patient has a pulse oximeter to check oxygen saturation at home. Informed and explained to patient why it is important to check oxygen at rest and on exertion at home. Informed patient that they should be 88 percent and above with their oxygen readings and that it is important to have levels above 88 to prevent damage to tissues in the body. Patient verbalizes understanding.     Goals/Expected Outcomes Short: Become more profiecient at using PLB.   Long: Become independent  at using PLB. Short: Become more profiecient at using PLB. continue to try to be consistent with CPAP use at night.   Long: Become independent at using PLB. Short: monitor oxygen at rest and with exertion at home. Long: maintain oxygen saturations above 88 percent independently.              Oxygen Discharge (Final Oxygen Re-Evaluation):  Oxygen Re-Evaluation - 10/11/22 1739       Program Oxygen Prescription   Program Oxygen Prescription None      Home Oxygen   Home Oxygen Device None    Sleep Oxygen Prescription CPAP    Home Exercise Oxygen Prescription None    Home Resting Oxygen Prescription None    Compliance with Home Oxygen Use Yes      Goals/Expected Outcomes   Short Term Goals To learn and understand importance of maintaining oxygen saturations>88%;To learn and understand importance of monitoring SPO2 with pulse oximeter and demonstrate accurate use of the pulse oximeter.    Long  Term Goals Maintenance of O2 saturations>88%;Verbalizes importance of monitoring SPO2 with pulse oximeter and return demonstration    Comments Patient has a pulse oximeter to check oxygen saturation at  home. Informed and explained to patient why it is important to check oxygen at rest and on exertion at home. Informed patient that they should be 88 percent and above with their oxygen readings and that it is important to have levels above 88 to prevent damage to tissues in the body. Patient verbalizes understanding.    Goals/Expected Outcomes Short: monitor oxygen at rest and with exertion at home. Long: maintain oxygen saturations above 88 percent independently.             Initial Exercise Prescription:  Initial Exercise Prescription - 08/06/22 1100       Date of Initial Exercise RX and Referring Provider   Date 08/06/22    Referring Provider Dr. Sarina Ser, MD      Oxygen   Maintain Oxygen Saturation 88% or higher      Treadmill   MPH 2.4    Grade 0    Minutes 15    METs 2.84      Recumbant Bike   Level 2    RPM 50    Watts 26    Minutes 15    METs 2.86      NuStep   Level 2    SPM 80    Minutes 15    METs 2.86      Biostep-RELP   Level 2    SPM 50    Minutes 15    METs 2.86      Prescription Details   Frequency (times per week) 2    Duration Progress to 30 minutes of continuous aerobic without signs/symptoms of physical distress      Intensity   THRR 40-80% of Max Heartrate 102-136    Ratings of Perceived Exertion 11-13    Perceived Dyspnea 0-4      Progression   Progression Continue to progress workloads to maintain intensity without signs/symptoms of physical distress.      Resistance Training   Training Prescription Yes    Weight 4 lb    Reps 10-15             Perform Capillary Blood Glucose checks as needed.  Exercise Prescription Changes:   Exercise Prescription Changes     Row Name 08/06/22 1100 08/15/22 1700 08/27/22 1300 09/10/22 1100 09/24/22 1300  Response to Exercise   Blood Pressure (Admit) 126/74 138/84 136/70 124/70 122/72   Blood Pressure (Exercise) 160/80 166/86 160/82 138/64 144/80   Blood Pressure (Exit)  144/78 134/72 112/74 104/62 112/66   Heart Rate (Admit) 68 bpm 78 bpm 84 bpm 83 bpm 75 bpm   Heart Rate (Exercise) 105 bpm 118 bpm 114 bpm 117 bpm 102 bpm   Heart Rate (Exit) 70 bpm 80 bpm 93 bpm 89 bpm 92 bpm   Oxygen Saturation (Admit) 98 % 96 % 95 % 94 % 95 %   Oxygen Saturation (Exercise) 93 % 93 % 91 % 91 % 91 %   Oxygen Saturation (Exit) 96 % 97 % 93 % 95 % 92 %   Rating of Perceived Exertion (Exercise) 11 11 13 12 13    Perceived Dyspnea (Exercise) 1 -- -- -- --   Symptoms Chronic Back Pain 9/10 none none none none   Comments Results 1st full day of exercise -- -- --   Duration -- Progress to 30 minutes of  aerobic without signs/symptoms of physical distress Continue with 30 min of aerobic exercise without signs/symptoms of physical distress. Continue with 30 min of aerobic exercise without signs/symptoms of physical distress. Continue with 30 min of aerobic exercise without signs/symptoms of physical distress.   Intensity -- THRR unchanged THRR unchanged THRR unchanged THRR unchanged     Progression   Progression -- Continue to progress workloads to maintain intensity without signs/symptoms of physical distress. Continue to progress workloads to maintain intensity without signs/symptoms of physical distress. Continue to progress workloads to maintain intensity without signs/symptoms of physical distress. Continue to progress workloads to maintain intensity without signs/symptoms of physical distress.   Average METs -- 2.62 2.45 2.95 2.89     Resistance Training   Training Prescription -- Yes Yes Yes Yes   Weight -- 4 lb 4 lb 4 lb 4 lb   Reps -- 10-15 10-15 10-15 10-15     Interval Training   Interval Training -- No No No No     Treadmill   MPH -- 1.9 2.1 2.4 2.4   Grade -- 0 0 0 0   Minutes -- 15 15 15 15    METs -- 2.45 2.61 2.84 2.84     Recumbant Bike   Level -- -- 2 3 5    Watts -- -- 22 27 34   Minutes -- -- 15 15 15    METs -- -- 2.65 2.87 3.08     NuStep   Level  -- 2 2 3 4    Minutes -- 15 15 15 15    METs -- 2.8 2.3 3.1 3.4     Biostep-RELP   Level -- -- -- -- 1   Minutes -- -- -- -- 15   METs -- -- -- -- 3     Oxygen   Maintain Oxygen Saturation -- 88% or higher 88% or higher 88% or higher 88% or higher    Row Name 10/04/22 1800 10/08/22 1400 10/22/22 1400 11/09/22 0800       Response to Exercise   Blood Pressure (Admit) -- 124/76 138/76 132/76    Blood Pressure (Exit) -- 132/76 120/72 112/72    Heart Rate (Admit) -- 72 bpm 84 bpm 75 bpm    Heart Rate (Exercise) -- 86 bpm 107 bpm 102 bpm    Heart Rate (Exit) -- 79 bpm 92 bpm 81 bpm    Oxygen Saturation (Admit) -- 92 % 95 % 96 %  Oxygen Saturation (Exercise) -- 90 % 91 % 90 %    Oxygen Saturation (Exit) -- 90 % 95 % 91 %    Rating of Perceived Exertion (Exercise) -- 11 13 13     Perceived Dyspnea (Exercise) -- 0 1 1    Symptoms -- none none none    Duration -- Continue with 30 min of aerobic exercise without signs/symptoms of physical distress. Continue with 30 min of aerobic exercise without signs/symptoms of physical distress. Continue with 30 min of aerobic exercise without signs/symptoms of physical distress.    Intensity -- THRR unchanged THRR unchanged THRR unchanged      Progression   Progression -- Continue to progress workloads to maintain intensity without signs/symptoms of physical distress. Continue to progress workloads to maintain intensity without signs/symptoms of physical distress. Continue to progress workloads to maintain intensity without signs/symptoms of physical distress.    Average METs -- 2.8 2.86 2.98      Resistance Training   Training Prescription -- Yes Yes Yes    Weight -- 4 lb 4 lb 4 lb    Reps -- 10-15 10-15 10-15      Interval Training   Interval Training -- No No No      Treadmill   MPH -- -- 2.4 2.6    Grade -- -- 0 0    Minutes -- -- 15 15    METs -- -- 2.84 2.99      Recumbant Bike   Level -- -- 5 6    Watts -- -- 41 40    Minutes -- --  15 15    METs -- -- 3.31 --      NuStep   Level -- 4 4 5     Minutes -- 15 15 15     METs -- 2.6 3.4 3.1      REL-XR   Level -- -- 3 --    Minutes -- -- 15 --    METs -- -- 3.3 --      Biostep-RELP   Level -- 3 1 4     Minutes -- 15 15 15     METs -- -- 2 3      Home Exercise Plan   Plans to continue exercise at Home (comment)  Walking twice a week Home (comment)  Walking twice a week Home (comment)  Walking twice a week Home (comment)  Walking twice a week    Frequency Add 3 additional days to program exercise sessions. Add 3 additional days to program exercise sessions. Add 3 additional days to program exercise sessions. Add 3 additional days to program exercise sessions.    Initial Home Exercises Provided 10/04/22 10/04/22 10/04/22 10/04/22      Oxygen   Maintain Oxygen Saturation 88% or higher 88% or higher 88% or higher 88% or higher             Exercise Comments:   Exercise Comments     Row Name 08/13/22 1721           Exercise Comments First full day of exercise!  Patient was oriented to gym and equipment including functions, settings, policies, and procedures.  Patient's individual exercise prescription and treatment plan were reviewed.  All starting workloads were established based on the results of the 6 minute walk test done at initial orientation visit.  The plan for exercise progression was also introduced and progression will be customized based on patient's performance and goals.  Exercise Goals and Review:   Exercise Goals     Row Name 08/06/22 1050             Exercise Goals   Increase Physical Activity Yes       Intervention Provide advice, education, support and counseling about physical activity/exercise needs.;Develop an individualized exercise prescription for aerobic and resistive training based on initial evaluation findings, risk stratification, comorbidities and participant's personal goals.       Expected Outcomes  Short Term: Attend rehab on a regular basis to increase amount of physical activity.;Long Term: Add in home exercise to make exercise part of routine and to increase amount of physical activity.;Long Term: Exercising regularly at least 3-5 days a week.       Increase Strength and Stamina Yes       Intervention Provide advice, education, support and counseling about physical activity/exercise needs.;Develop an individualized exercise prescription for aerobic and resistive training based on initial evaluation findings, risk stratification, comorbidities and participant's personal goals.       Expected Outcomes Short Term: Increase workloads from initial exercise prescription for resistance, speed, and METs.;Short Term: Perform resistance training exercises routinely during rehab and add in resistance training at home;Long Term: Improve cardiorespiratory fitness, muscular endurance and strength as measured by increased METs and functional capacity ( )       Able to understand and use rate of perceived exertion (RPE) scale Yes       Intervention Provide education and explanation on how to use RPE scale       Expected Outcomes Short Term: Able to use RPE daily in rehab to express subjective intensity level;Long Term:  Able to use RPE to guide intensity level when exercising independently       Able to understand and use Dyspnea scale Yes       Intervention Provide education and explanation on how to use Dyspnea scale       Expected Outcomes Short Term: Able to use Dyspnea scale daily in rehab to express subjective sense of shortness of breath during exertion;Long Term: Able to use Dyspnea scale to guide intensity level when exercising independently       Knowledge and understanding of Target Heart Rate Range (THRR) Yes       Intervention Provide education and explanation of THRR including how the numbers were predicted and where they are located for reference       Expected Outcomes Short Term: Able to  state/look up THRR;Long Term: Able to use THRR to govern intensity when exercising independently;Short Term: Able to use daily as guideline for intensity in rehab       Able to check pulse independently Yes       Intervention Provide education and demonstration on how to check pulse in carotid and radial arteries.;Review the importance of being able to check your own pulse for safety during independent exercise       Expected Outcomes Short Term: Able to explain why pulse checking is important during independent exercise;Long Term: Able to check pulse independently and accurately       Understanding of Exercise Prescription Yes       Intervention Provide education, explanation, and written materials on patient's individual exercise prescription       Expected Outcomes Short Term: Able to explain program exercise prescription;Long Term: Able to explain home exercise prescription to exercise independently                Exercise Goals Re-Evaluation :  Exercise Goals Re-Evaluation     Row Name 08/13/22 1722 08/15/22 1801 08/27/22 1315 09/10/22 1158 09/12/22 1755     Exercise Goal Re-Evaluation   Exercise Goals Review Increase Physical Activity;Able to understand and use rate of perceived exertion (RPE) scale;Knowledge and understanding of Target Heart Rate Range (THRR);Understanding of Exercise Prescription;Increase Strength and Stamina;Able to understand and use Dyspnea scale;Able to check pulse independently Increase Physical Activity;Increase Strength and Stamina;Understanding of Exercise Prescription Increase Physical Activity;Increase Strength and Stamina;Understanding of Exercise Prescription Increase Physical Activity;Increase Strength and Stamina;Understanding of Exercise Prescription Increase Physical Activity;Increase Strength and Stamina;Understanding of Exercise Prescription   Comments Reviewed RPE scale, THR and program prescription with pt today.  Pt voiced understanding and was given  a copy of goals to take home. [name] did well for her first session of rehab.  She was able to do level 2 on the Nustep and 1.9 mph on the treadmill. Her RPEs are appropriate  with exercise. We will continue to monitor as she continues to progress in the program. [name] continues to do well in the program. She recently increased her treadmill speed to 2.1 mph with no incline. She also began using the recumbent bike and did well with level 2. She has continued to use 4 lb hand weights for resistance training as well. We will continue to monitor her progress in the program. [name] continues to do well in rehab. She increased her treadmill speed to 2.4 mph. She could benefit from adding an incline to her prescription. She also increased to level 3 on the recumbent bike and T4 Nustep. All with appropriate RPEs.She continues to reach her THR most sessions. Will continue to monitor. Patient reports that she is procressing with her exercise in class and continue to increase her work loads when tolerated. She has not been exercising at home but packing boxes. She has some frustration that she can not do things at home such as lifiting and walking like she used to be able to do. Time prohibited going over home exercise guidelines today but this will be done in the near future with program staff.   Expected Outcomes Short: Use RPE daily to regulate intensity.  Long: Follow program prescription in THR. Short: Continue to exercise at initial exercise prescription Long: Increase overall MET level and stamina Short: Try to add incline to treadmill workload. Long: Continue to improve strength and stamina. Short:Add small incline to treadmill Long: Continue to increase overall MET level and stamina Short: go over home exercise guidelines with staff memeber. Long: become independent with exercise program.    Row Name 09/24/22 1354 10/04/22 1818 10/08/22 1448 10/22/22 1408 11/09/22 0828     Exercise Goal Re-Evaluation   Exercise Goals  Review Increase Physical Activity;Increase Strength and Stamina;Understanding of Exercise Prescription Increase Physical Activity;Increase Strength and Stamina;Able to understand and use rate of perceived exertion (RPE) scale;Able to understand and use Dyspnea scale;Able to check pulse independently;Knowledge and understanding of Target Heart Rate Range (THRR);Understanding of Exercise Prescription Increase Physical Activity;Increase Strength and Stamina;Understanding of Exercise Prescription Increase Physical Activity;Increase Strength and Stamina;Understanding of Exercise Prescription Increase Physical Activity;Increase Strength and Stamina;Understanding of Exercise Prescription   Comments [name] continues to do well in rehab. She continues to walk on the treadmill at a speed of 2.4 mph with an incline of 0%. She also improved to level 5 on the recumbent bike and level 4 on the T4 nustep. She has conitnued to use 3 lb hand weights for resistance training as well.  We will continue to monitor her progress in the program. Reviewed home exercise with pt today.  Pt plans to walk at home for exercise.  Reviewed THR, pulse, RPE, sign and symptoms, pulse oximetery and when to call 911 or MD.  Also discussed weather considerations and indoor options.  Pt voiced understanding. [name] is doing well in rehab.  She [place] up to level 3 on the BioStep.  Her RPE is staying around 11 so we will encourage her to push her spm or increase her workload to aim for 12-13 on RPE. [name] is doing well in rehab. She continues to work at level 4 on the T4 nustep and level 5 on the recumbent bike. She also began using the XR at level and did well. She continues to do well walking on the treadmill at a speed of 2.4 mph with no incline as well. We will continue to monitor her progress in the program. [name] is doing well in rehab. She improved her workloads on the biostep, T4 nustep, and recumbent bike. She also increased her treadmill speed to 2.6 mph with no  incline. She continues to do well with 4 lb hand weights for resistance training as well. We will continue to monitor her progress in the program.   Expected Outcomes Short: Continue to progressively increase treadmill workload. Long: Continue to improve strength and stamina. Short: Begin walking at home twice a week in addition to rehab. Long: Continue to exercise independently. Short: Increase RPM or level on equipment for RPE 12-13 Long: Continue to improve stamina Short: Add incline to treadmill workload. Long: Continue to improve strength and stamina. Short: Try 5 lb hand weights for resistance training. Long: Continue to improve strength and stamina.            Discharge Exercise Prescription (Final Exercise Prescription Changes):  Exercise Prescription Changes - 11/09/22 0800       Response to Exercise   Blood Pressure (Admit) 132/76    Blood Pressure (Exit) 112/72    Heart Rate (Admit) 75 bpm    Heart Rate (Exercise) 102 bpm    Heart Rate (Exit) 81 bpm    Oxygen Saturation (Admit) 96 %    Oxygen Saturation (Exercise) 90 %    Oxygen Saturation (Exit) 91 %    Rating of Perceived Exertion (Exercise) 13    Perceived Dyspnea (Exercise) 1    Symptoms none    Duration Continue with 30 min of aerobic exercise without signs/symptoms of physical distress.    Intensity THRR unchanged      Progression   Progression Continue to progress workloads to maintain intensity without signs/symptoms of physical distress.    Average METs 2.98      Resistance Training   Training Prescription Yes    Weight 4 lb    Reps 10-15      Interval Training   Interval Training No      Treadmill   MPH 2.6    Grade 0    Minutes 15    METs 2.99      Recumbant Bike   Level 6    Watts 40    Minutes 15      NuStep   Level 5    Minutes 15    METs 3.1      Biostep-RELP   Level 4    Minutes 15    METs 3      Home Exercise Plan   Plans to continue exercise at Home (comment)  Walking twice a  week   Frequency Add 3 additional days to program exercise sessions.    Initial Home Exercises Provided 10/04/22      Oxygen   Maintain Oxygen Saturation 88% or higher             Nutrition:  Target Goals: Understanding of nutrition guidelines, daily intake of sodium 1500mg , cholesterol 200mg , calories 30% from fat and 7% or less from saturated fats, daily to have 5 or more servings of fruits and vegetables.  Education: All About Nutrition: -Group instruction provided by verbal, written material, interactive activities, discussions, models, and posters to present general guidelines for heart healthy nutrition including fat, fiber, MyPlate, the role of sodium in heart healthy nutrition, utilization of the nutrition label, and utilization of this knowledge for meal planning. Follow up email sent as well. Written material given at graduation.   Biometrics:  Pre Biometrics - 08/06/22 1051       Pre Biometrics   Height 5\' 5"  (1.651 m)    Weight 215 lb 9.6 oz (97.8 kg)    Waist Circumference 41.5 inches    Hip Circumference 43 inches    Waist to Hip Ratio 0.97 %    BMI (Calculated) 35.88    Single Leg Stand 30 seconds              Nutrition Therapy Plan and Nutrition Goals:  Nutrition Therapy & Goals - 08/20/22 1722       Nutrition Therapy   Diet Heart healthy, low Na    Drug/Food Interactions Statins/Certain Fruits    Protein (specify units) 60-70g    Fiber 25 grams    Whole Grain Foods 3 servings    Saturated Fats 12 max. grams    Fruits and Vegetables 8 servings/day    Sodium 2 grams      Personal Nutrition Goals   Nutrition Goal ST: include 6-8 smaller meals per day including heart healthy fats, fiber, and limiting salt LT: follow MyPlate guidelines    Comments 67 y.o. F admitted to pulmonary rehab for COPD. PMHx includes GERD, hypothyroidism, CKD stg 3, HLD, tobacco use, anxiety/depression, PTSD, vit B12 deficiency, OSA. PSHx includes cholecystectomy,  thyroidectomy, parathyroidectomy. Documented homelessness 09/22/21, now housed. Labs reviewed. Medications reviewed atorvastatin, furosemide, synthroid, pantoprazole, celexa. [name] reports being unaware of any CKD stg 3 diagnosis -  discussed how that is in her past medical history and encouraged her to speak with her MD regarding this. [name] reports she would like to get back to 6-8 meals per day and cooking more heart healthy meals as she used to eat this way and enjoyed it - she lives with someone and is not able to eat this way; she hopes to find a new living situation within 2 weeks. She reports not eating much 15-30 oz of water raisin toast or multigrain peaches (canned in it's own juice) and pineapples. L: Small plate of leftovers (usually beans) D: beans with frozen steamable broccoli. Drinks: water with coffee (2-3 10-12oz cups). Discussed heart healthy eating and CKD stg 3 MNT.      Intervention Plan   Intervention Prescribe, educate and counsel regarding individualized specific dietary modifications aiming towards targeted core components such as weight, hypertension, lipid management, diabetes, heart failure and other comorbidities.    Expected Outcomes Short Term Goal: A plan has been developed with personal nutrition goals set during dietitian appointment.;Short Term Goal: Understand basic principles of dietary content, such as calories, fat, sodium, cholesterol and nutrients.;Long Term  Goal: Adherence to prescribed nutrition plan.             Nutrition Assessments:  MEDIFICTS Score Key: ?70 Need to make dietary changes  40-70 Heart Healthy Diet ? 40 Therapeutic Level Cholesterol Diet  Flowsheet Row Pulmonary Rehab from 08/06/2022 in [facility]  Picture Your Plate Total Score on Admission 72      Picture Your Plate Scores: <16 Unhealthy dietary pattern with much room for improvement. 41-50 Dietary pattern unlikely to meet recommendations for good health and room  for improvement. 51-60 More healthful dietary pattern, with some room for improvement.  >60 Healthy dietary pattern, although there may be some specific behaviors that could be improved.   Nutrition Goals Re-Evaluation:  Nutrition Goals Re-Evaluation     Row Name 09/12/22 1735 10/11/22 1745           Goals   Current Weight -- 215 lb (97.5 kg)      Nutrition Goal ST: include 6-8 smaller meals per day including heart healthy fats, fiber, and limiting salt LT: follow MyPlate guidelines Eat smaller      Comment Patient reports that she is trying to follow dietary recommendations but has some instability in her living situation and ability to always cook on a routine schedule. She does report that she does not eat katchup becasue that causes her to retain fluid. Patient was informed on why it is important to maintain a balanced diet when dealing with Respiratory issues. Explained that it takes a lot of energy to breath and when they are short of breath often they will need to have a good diet to help keep up with the calories they are expending for breathing.      Expected Outcome Short: continue to work on securing living situaiton so that she has more control over her routine and ability to have access to a kitchen to cook healthy meals. Long: eat a heart healthy diet and eat 6-8 small meals a day once she has more stability in living situaiton. Short: Choose and plan snacks accordingly to patients caloric intake to improve breathing. Long: Maintain a diet independently that meets their caloric intake to aid in daily shortness of breath.               Nutrition Goals Discharge (Final Nutrition Goals Re-Evaluation):  Nutrition Goals Re-Evaluation - 10/11/22 1745       Goals   Current Weight 215 lb (97.5 kg)    Nutrition Goal Eat smaller    Comment Patient was informed on why it is important to maintain a balanced diet when dealing with Respiratory issues. Explained that it takes a lot of  energy to breath and when they are short of breath often they will need to have a good diet to help keep up with the calories they are expending for breathing.    Expected Outcome Short: Choose and plan snacks accordingly to patients caloric intake to improve breathing. Long: Maintain a diet independently that meets their caloric intake to aid in daily shortness of breath.             Psychosocial: Target Goals: Acknowledge presence or absence of significant depression and/or stress, maximize coping skills, provide positive support system. Participant is able to verbalize types and ability to use techniques and skills needed for reducing stress and depression.   Education: Stress, Anxiety, and Depression - Group verbal and visual presentation to define topics covered.  Reviews how body is impacted  by stress, anxiety, and depression.  Also discusses healthy ways to reduce stress and to treat/manage anxiety and depression.  Written material given at graduation.   Education: Sleep Hygiene -Provides group verbal and written instruction about how sleep can affect your health.  Define sleep hygiene, discuss sleep cycles and impact of sleep habits. Review good sleep hygiene tips.    Initial Review & Psychosocial Screening:  Initial Psych Review & Screening - 07/26/22 1342       Initial Review   Current issues with Current Stress Concerns    Source of Stress Concerns Financial;Chronic Illness      Family Dynamics   Good Support System? Yes   daughters     Barriers   Psychosocial barriers to participate in program There are no identifiable barriers or psychosocial needs.;The patient should benefit from training in stress management and relaxation.      Screening Interventions   Interventions Encouraged to exercise;Provide feedback about the scores to participant    Expected Outcomes Short Term goal: Utilizing psychosocial counselor, staff and physician to assist with identification of  specific Stressors or current issues interfering with healing process. Setting desired goal for each stressor or current issue identified.;Long Term Goal: Stressors or current issues are controlled or eliminated.;Short Term goal: Identification and review with participant of any Quality of Life or Depression concerns found by scoring the questionnaire.;Long Term goal: The participant improves quality of Life and PHQ9 Scores as seen by post scores and/or verbalization of changes             Quality of Life Scores:  Scores of 19 and below usually indicate a poorer quality of life in these areas.  A difference of  2-3 points is a clinically meaningful difference.  A difference of 2-3 points in the total score of the Quality of Life Index has been associated with significant improvement in overall quality of life, self-image, physical symptoms, and general health in studies assessing change in quality of life.  PHQ-9: Review Flowsheet  More data exists      09/24/2022 08/06/2022 03/26/2022 02/01/2022 10/18/2021  Depression screen PHQ 2/9  Decreased Interest 1 0 0 0 0  Down, Depressed, Hopeless 1 0 0 0 0  PHQ - 2 Score 2 0 0 0 0  Altered sleeping 2 1 1 3  -  Tired, decreased energy 2 1 1  0 -  Change in appetite 1 0 0 0 -  Feeling bad or failure about yourself  0 0 0 0 -  Trouble concentrating 0 0 0 0 -  Moving slowly or fidgety/restless 0 0 0 0 -  Suicidal thoughts 0 0 0 0 -  PHQ-9 Score 7 2 2 3  -  Difficult doing work/chores - Not difficult at all - Not difficult at all -   Interpretation of Total Score  Total Score Depression Severity:  1-4 = Minimal depression, 5-9 = Mild depression, 10-14 = Moderate depression, 15-19 = Moderately severe depression, 20-27 = Severe depression   Psychosocial Evaluation and Intervention:  Psychosocial Evaluation - 07/26/22 1401       Psychosocial Evaluation & Interventions   Interventions Encouraged to exercise with the program and follow exercise  prescription;Stress management education;Relaxation education    Comments [name] is coming to Pulmonary Rehab with dypsnea and mild COPD. She has noticed that her breathing and stamina have gotten a lot worse in the last year, where it is hard for her to walk even short distances where before she could  walk with no problem. She had her thyroid surgery years ago and states she has gained a lot of weight after that she can't seem to shake. She is working really hard on quitting smoking entirely. She tried the patches and had to stop because of their side effects. She has started changing her lifestyle habits, like drinking more water or going outside on her porch instead of smoking. She is down to very sporadically smoking. She mentioned that some triggers are her car (since is smells like smoke), when she is on the phone a lot during her work calls, and when her what used to be very big temper flares up. As far as her temper, she notes that she has made a lot of positive changes over the years and she doesn't let as many things bother her as she used to. She really likes her job at [employer] where she works from home most days. However, she notes that she sits a a lot more than she used to. She is looking forward to becoming more active, but hesitant on what she can actually do given her worsening health concerns.    Expected Outcomes Short: attend pulmonary rehab for education and exercise. Long: develop and maintain positive self care habits.    Continue Psychosocial Services  Follow up required by staff             Psychosocial Re-Evaluation:  Psychosocial Re-Evaluation     Row Name 09/12/22 1750 10/11/22 1747           Psychosocial Re-Evaluation   Current issues with Current Stress Concerns Current Stress Concerns      Comments no new mental health concerns but does continue to have stress related to current living situation and her daughter's health concerns. [name] is living with a  friend she has known for 40 years. Her main stressor is her room mate that is an alcoholic. She likes her job and what she does but states that there is some drama that is bothering her at work.      Expected Outcomes Short: continue to work to a more stable living situation. Long: Continue to maintain good mental health habits. Short: Attend [facility] stress management education to decrease stress. Long: Maintain exercise Post [facility] to keep stress at a minimum.      Interventions Encouraged to attend Pulmonary Rehabilitation for the exercise Encouraged to attend Pulmonary Rehabilitation for the exercise      Continue Psychosocial Services  Follow up required by staff Follow up required by staff               Psychosocial Discharge (Final Psychosocial Re-Evaluation):  Psychosocial Re-Evaluation - 10/11/22 1747       Psychosocial Re-Evaluation   Current issues with Current Stress Concerns    Comments [name] is living with a friend she has known for 40 years. Her main stressor is her room mate that is an alcoholic. She likes her job and what she does but states that there is some drama that is bothering her at work.    Expected Outcomes Short: Attend [facility] stress management education to decrease stress. Long: Maintain exercise Post [facility] to keep stress at a minimum.    Interventions Encouraged to attend Pulmonary Rehabilitation for the exercise    Continue Psychosocial Services  Follow up required by staff             Education: Education Goals: Education classes will be provided on a weekly basis, covering required  topics. Participant will state understanding/return demonstration of topics presented.  Learning Barriers/Preferences:  Learning Barriers/Preferences - 07/26/22 1342       Learning Barriers/Preferences   Learning Barriers None    Learning Preferences None             General Pulmonary Education Topics:  Infection Prevention: - Provides verbal and  written material to individual with discussion of infection control including proper hand washing and proper equipment cleaning during exercise session. Flowsheet Row Pulmonary Rehab from 09/12/2022 in [facility] Card[facility] and Pulmonary Rehab  Date 08/06/22  Educator NT  Instruction Review Code 1- Verbalizes Understanding       Falls Prevention: - Provides verbal and written material to individual with discussion of falls prevention and safety. Flowsheet Row Pulmonary Rehab from 09/12/2022 in [facility] Card[facility] and Pulmonary Rehab  Date 08/06/22  Educator NT  Instruction Review Code 1- Verbalizes Understanding       Chronic Lung Disease Review: - Group verbal instruction with posters, models, PowerPoint presentations and videos,  to review new updates, new respiratory medications, new advancements in procedures and treatments. Providing information on websites and "800" numbers for continued self-education. Includes information about supplement oxygen, available portable oxygen systems, continuous and intermittent flow rates, oxygen safety, concentrators, and Medicare reimbursement for oxygen. Explanation of Pulmonary Drugs, including class, frequency, complications, importance of spacers, rinsing mouth after steroid MDI's, and proper cleaning methods for nebulizers. Review of basic lung anatomy and physiology related to function, structure, and complications of lung disease. Review of risk factors. Discussion about methods for diagnosing sleep apnea and types of masks and machines for OSA. Includes a review of the use of types of environmental controls: home humidity, furnaces, filters, dust mite/pet prevention, HEPA vacuums. Discussion about weather changes, air quality and the benefits of nasal washing. Instruction on Warning signs, infection symptoms, calling MD promptly, preventive modes, and value of vaccinations. Review of effective airway clearance, coughing and/or vibration techniques. Emphasizing that  all should Create an Action Plan. Written material given at graduation.   AED/CPR: - Group verbal and written instruction with the use of models to demonstrate the basic use of the AED with the basic ABC's of resuscitation.    Anatomy and Cardiac Procedures: - Group verbal and visual presentation and models provide information about basic cardiac anatomy and function. Reviews the testing methods done to diagnose heart disease and the outcomes of the test results. Describes the treatment choices: Medical Management, Angioplasty, or Coronary Bypass Surgery for treating various heart conditions including Myocardial Infarction, Angina, Valve Disease, and Cardiac Arrhythmias.  Written material given at graduation.   Medication Safety: - Group verbal and visual instruction to review commonly prescribed medications for heart and lung disease. Reviews the medication, class of the drug, and side effects. Includes the steps to properly store meds and maintain the prescription regimen.  Written material given at graduation.   Other: -Provides group and verbal instruction on various topics (see comments)   Knowledge Questionnaire Score:  Knowledge Questionnaire Score - 08/06/22 1031       Knowledge Questionnaire Score   Pre Score 14/18              Core Components/Risk Factors/Patient Goals at Admission:  Personal Goals and Risk Factors at Admission - 08/06/22 1035       Core Components/Risk Factors/Patient Goals on Admission    Weight Management Yes;Weight Gain    Intervention Weight Management: Develop a combined nutrition and exercise program designed to reach desired caloric  intake, while maintaining appropriate intake of nutrient and fiber, sodium and fats, and appropriate energy expenditure required for the weight goal.;Weight Management: Provide education and appropriate resources to help participant work on and attain dietary goals.;Weight Management/Obesity: Establish reasonable  short term and long term weight goals.    Admit Weight 215 lb 9.6 oz (97.8 kg)    Goal Weight: Short Term 205 lb (93 kg)    Goal Weight: Long Term 175 lb (79.4 kg)    Expected Outcomes Short Term: Continue to assess and modify interventions until short term weight is achieved;Long Term: Adherence to nutrition and physical activity/exercise program aimed toward attainment of established weight goal;Weight Loss: Understanding of general recommendations for a balanced deficit meal plan, which promotes 1-2 lb weight loss per week and includes a negative energy balance of [phone] kcal/d;Understanding recommendations for meals to include 15-35% energy as protein, 25-35% energy from fat, 35-60% energy from carbohydrates, less than 200mg  of dietary cholesterol, 20-35 gm of total fiber daily;Understanding of distribution of calorie intake throughout the day with the consumption of 4-5 meals/snacks    Tobacco Cessation Yes    Number of packs per day --   inconsistent   Intervention Assist the participant in steps to quit. Provide individualized education and counseling about committing to Tobacco Cessation, relapse prevention, and pharmacological support that can be provided by physician.;Education officer, environmental, assist with locating and accessing local/national Quit Smoking programs, and support quit date choice.    Expected Outcomes Short Term: Will demonstrate readiness to quit, by selecting a quit date.;Short Term: Will quit all tobacco product use, adhering to prevention of relapse plan.;Long Term: Complete abstinence from all tobacco products for at least 12 months from quit date.    Improve shortness of breath with ADL's Yes    Intervention Provide education, individualized exercise plan and daily activity instruction to help decrease symptoms of SOB with activities of daily living.    Expected Outcomes Short Term: Improve cardiorespiratory fitness to achieve a reduction of symptoms when performing  ADLs;Long Term: Be able to perform more ADLs without symptoms or delay the onset of symptoms    Hypertension Yes    Intervention Provide education on lifestyle modifcations including regular physical activity/exercise, weight management, moderate sodium restriction and increased consumption of fresh fruit, vegetables, and low fat dairy, alcohol moderation, and smoking cessation.;Monitor prescription use compliance.    Expected Outcomes Short Term: Continued assessment and intervention until BP is < 140/63mm HG in hypertensive participants. < 130/61mm HG in hypertensive participants with diabetes, heart failure or chronic kidney disease.;Long Term: Maintenance of blood pressure at goal levels.    Lipids Yes    Intervention Provide education and support for participant on nutrition & aerobic/resistive exercise along with prescribed medications to achieve LDL 70mg , HDL >40mg .    Expected Outcomes Short Term: Participant states understanding of desired cholesterol values and is compliant with medications prescribed. Participant is following exercise prescription and nutrition guidelines.;Long Term: Cholesterol controlled with medications as prescribed, with individualized exercise RX and with personalized nutrition plan. Value goals: LDL < 70mg , HDL > 40 mg.             Education:Diabetes - Individual verbal and written instruction to review signs/symptoms of diabetes, desired ranges of glucose level fasting, after meals and with exercise. Acknowledge that pre and post exercise glucose checks will be done for 3 sessions at entry of program.   Know Your Numbers and Heart Failure: - Group verbal and visual instruction to discuss  disease risk factors for cardiac and pulmonary disease and treatment options.  Reviews associated critical values for Overweight/Obesity, Hypertension, Cholesterol, and Diabetes.  Discusses basics of heart failure: signs/symptoms and treatments.  Introduces Heart Failure Zone  chart for action plan for heart failure.  Written material given at graduation. Flowsheet Row Pulmonary Rehab from 09/12/2022 in [facility] Cardiac and Pulmonary Rehab  Education need identified 08/06/22  Date 09/12/22  Educator MS  Instruction Review Code 1- Verbalizes Understanding       Core Components/Risk Factors/Patient Goals Review:   Goals and Risk Factor Review     Row Name 09/12/22 1745 10/11/22 1741           Core Components/Risk Factors/Patient Goals Review   Personal Goals Review Weight Management/Obesity;Tobacco Cessation Improve shortness of breath with ADL's      Review Patient reports that her weight has gone up. She is continually dealing with fluid retention. She does not have a scale at home and she was encouraged to buy one to monitor weight. She continues to smoke 2-3 cigarettes a day and does not currently have a quit date. she does take all medications as prescribed. Spoke to patient about their shortness of breath and what they can do to improve. Patient has been informed of breathing techniques when starting the program. Patient is informed to tell staff if they have had any med changes and that certain meds they are taking or not taking can be causing shortness of breath. Informed patient that they can take rest breaks when they get short of breath, regain breath and resume exercise.      Expected Outcomes Short: buy and scale and begin to monitor weight at home on a daily basis. Long: work on quitting smoking. Short: Attend [facility] regularly to improve shortness of breath with ADL's. Long: maintain independence with ADL's               Core Components/Risk Factors/Patient Goals at Discharge (Final Review):   Goals and Risk Factor Review - 10/11/22 1741       Core Components/Risk Factors/Patient Goals Review   Personal Goals Review Improve shortness of breath with ADL's    Review Spoke to patient about their shortness of breath and what they can do to improve.  Patient has been informed of breathing techniques when starting the program. Patient is informed to tell staff if they have had any med changes and that certain meds they are taking or not taking can be causing shortness of breath. Informed patient that they can take rest breaks when they get short of breath, regain breath and resume exercise.    Expected Outcomes Short: Attend [facility] regularly to improve shortness of breath with ADL's. Long: maintain independence with ADL's             ITP Comments:  ITP Comments     Row Name 07/26/22 1350 08/06/22 1028 08/13/22 1721 08/22/22 1402 09/19/22 0939   ITP Comments Initial phone call completed. Diagnosis can be found in [facility] 12/1. EP Orientation scheduled for Monday 4/1 at 9am.       [name] is a current tobacco user. Intervention for tobacco cessation was provided at the initial medical review. She was asked about readiness to quit and reported she is ready to quit . Patient was advised and educated about tobacco cessation using combination therapy, tobacco cessation classes, quit line, and quit smoking apps. Patient demonstrated understanding of this material. Staff will continue to provide encouragement and follow up with  the patient throughout the program. Completed and gym orientation. Initial ITP created and sent for review to Dr. Vida Rigger, Medical Director. First full day of exercise!  Patient was oriented to gym and equipment including functions, settings, policies, and procedures.  Patient's individual exercise prescription and treatment plan were reviewed.  All starting workloads were established based on the results of the 6 minute walk test done at initial orientation visit.  The plan for exercise progression was also introduced and progression will be customized based on patient's performance and goals. 30 day review completed. ITP sent to Dr. Jinny Sanders, Medical Director of  Pulmonary Rehab. Continue with ITP unless changes are  made by physician. Pt still new to program. 30 Day review completed. Medical Director ITP review done, changes made as directed, and signed approval by Medical Director.    Row Name 10/17/22 1130 11/13/22 1503         ITP Comments 30 Day review completed. Medical Director ITP review done, changes made as directed, and signed approval by Medical Director. 30 Day review completed. Medical Director ITP review done, changes made as directed, and signed approval by Medical Director.               Comments:

## 2022-11-15 ENCOUNTER — Encounter: Payer: BC Managed Care – PPO | Admitting: *Deleted

## 2022-11-15 DIAGNOSIS — J449 Chronic obstructive pulmonary disease, unspecified: Secondary | ICD-10-CM | POA: Diagnosis not present

## 2022-11-15 NOTE — Progress Notes (Signed)
Daily Session Note  Patient Details  Name: [name] MRN: [id number] Date of Birth: 04-26-1956 Referring Provider:   Flowsheet Row Pulmonary Rehab from 08/06/2022 in [facility] Cardiac and Pulmonary Rehab  Referring Provider Dr. Sarina Ser, MD       Encounter Date: 11/15/2022  Check In:  Session Check In - 11/15/22 1719       Check-In   Supervising physician immediately available to respond to emergencies See telemetry face sheet for immediately available ER MD    Location [name]    Staff Present [name], RN BSN;Susanne Bice, RN, BSN, CCRP    Virtual Visit No    Medication changes reported     No    Fall or balance concerns reported    No    Tobacco Cessation No Change    Current number of cigarettes/nicotine per day     2    Warm-up and Cool-down Performed on first and last piece of equipment    Resistance Training Performed Yes    VAD Patient? No    PAD/SET Patient? No      Pain Assessment   Currently in Pain? No/denies                Social History   Tobacco Use  Smoking Status Every Day   Current packs/day: 1.50   Average packs/day: 1.5 packs/day for 39.0 years (58.5 ttl pk-yrs)   Types: Cigarettes  Smokeless Tobacco Never  Tobacco Comments   2 cigarettes a day- 08/06/2022 khj    Goals Met:  Independence with exercise equipment Exercise tolerated well No report of concerns or symptoms today Strength training completed today  Goals Unmet:  Not Applicable  Comments: Pt able to follow exercise prescription today without complaint.  Will continue to monitor for progression.    Dr. Bethann Punches is Medical Director for [facility] Cardiac Rehabilitation.  Dr. Vida Rigger is Medical Director for [facility] Pulmonary Rehabilitation.

## 2022-11-19 ENCOUNTER — Encounter: Payer: BC Managed Care – PPO | Admitting: *Deleted

## 2022-11-19 DIAGNOSIS — J449 Chronic obstructive pulmonary disease, unspecified: Secondary | ICD-10-CM | POA: Diagnosis not present

## 2022-11-19 NOTE — Progress Notes (Signed)
Daily Session Note  Patient Details  Name: [name] MRN: [id number] Date of Birth: 01/23/1956 Referring Provider:   Flowsheet Row Pulmonary Rehab from 08/06/2022 in [facility] Cardiac and Pulmonary Rehab  Referring Provider Dr. Sarina Ser, MD       Encounter Date: 11/19/2022  Check In:  Session Check In - 11/19/22 1731       Check-In   Supervising physician immediately available to respond to emergencies See telemetry face sheet for immediately available ER MD    Location [name]    Staff Present [name], RN BSN;Joseph [place], RCP,RRT,BSRT;Laureen [place], [place], RRT, CPFT    Virtual Visit No    Medication changes reported     No    Fall or balance concerns reported    No    Tobacco Cessation No Change    Current number of cigarettes/nicotine per day     2    Warm-up and Cool-down Performed on first and last piece of equipment    Resistance Training Performed Yes    VAD Patient? No    PAD/SET Patient? No      Pain Assessment   Currently in Pain? No/denies                Social History   Tobacco Use  Smoking Status Every Day   Current packs/day: 1.50   Average packs/day: 1.5 packs/day for 39.0 years (58.5 ttl pk-yrs)   Types: Cigarettes  Smokeless Tobacco Never  Tobacco Comments   2 cigarettes a day- 08/06/2022 khj    Goals Met:  Independence with exercise equipment Exercise tolerated well No report of concerns or symptoms today Strength training completed today  Goals Unmet:  Not Applicable  Comments: Pt able to follow exercise prescription today without complaint.  Will continue to monitor for progression.    Dr. Bethann Punches is Medical Director for [facility] Cardiac Rehabilitation.  Dr. Vida Rigger is Medical Director for [facility] Pulmonary Rehabilitation.

## 2022-11-22 ENCOUNTER — Encounter: Payer: BC Managed Care – PPO | Admitting: *Deleted

## 2022-11-22 DIAGNOSIS — J449 Chronic obstructive pulmonary disease, unspecified: Secondary | ICD-10-CM

## 2022-11-22 DIAGNOSIS — R0602 Shortness of breath: Secondary | ICD-10-CM

## 2022-11-22 NOTE — Progress Notes (Signed)
Daily Session Note  Patient Details  Name: [name] MRN: [id number] Date of Birth: 04/29/1956 Referring Provider:   Flowsheet Row Pulmonary Rehab from 08/06/2022 in [facility] Cardiac and Pulmonary Rehab  Referring Provider Dr. Sarina Ser, MD       Encounter Date: 11/22/2022  Check In:  Session Check In - 11/22/22 1706       Check-In   Supervising physician immediately available to respond to emergencies See telemetry face sheet for immediately available ER MD    Location [name]    Staff Present Cora Collum, RN, BSN, CCRP;Dunya Meiners Jewel Baize, RN BSN;Joseph [place], [place]    Virtual Visit No    Medication changes reported     No    Fall or balance concerns reported    No    Tobacco Cessation No Change    Current number of cigarettes/nicotine per day     2    Warm-up and Cool-down Performed on first and last piece of equipment    Resistance Training Performed Yes    VAD Patient? No    PAD/SET Patient? No      Pain Assessment   Currently in Pain? No/denies                Social History   Tobacco Use  Smoking Status Every Day   Current packs/day: 1.50   Average packs/day: 1.5 packs/day for 39.0 years (58.5 ttl pk-yrs)   Types: Cigarettes  Smokeless Tobacco Never  Tobacco Comments   2 cigarettes a day- 08/06/2022 khj    Goals Met:  Independence with exercise equipment Exercise tolerated well No report of concerns or symptoms today Strength training completed today  Goals Unmet:  Not Applicable  Comments: Pt able to follow exercise prescription today without complaint.  Will continue to monitor for progression.    Dr. Bethann Punches is Medical Director for [facility] Cardiac Rehabilitation.  Dr. Vida Rigger is Medical Director for [facility] Pulmonary Rehabilitation.

## 2022-11-26 ENCOUNTER — Encounter: Payer: Medicare Other | Admitting: *Deleted

## 2022-11-26 DIAGNOSIS — J449 Chronic obstructive pulmonary disease, unspecified: Secondary | ICD-10-CM

## 2022-11-26 NOTE — Progress Notes (Signed)
Daily Session Note  Patient Details  Name: [name] MRN: [id number] Date of Birth: 20-Dec-1955 Referring Provider:   Flowsheet Row Pulmonary Rehab from 08/06/2022 in [facility] Cardiac and Pulmonary Rehab  Referring Provider Dr. Sarina Ser, MD       Encounter Date: 11/26/2022  Check In:  Session Check In - 11/26/22 1724       Check-In   Supervising physician immediately available to respond to emergencies See telemetry face sheet for immediately available ER MD    Location [name]    Staff Present [name], RN BSN;Joseph [place], RCP,RRT,BSRT;Laureen [place], [place], RRT, CPFT    Virtual Visit No    Medication changes reported     No    Fall or balance concerns reported    No    Tobacco Cessation No Change    Current number of cigarettes/nicotine per day     2    Warm-up and Cool-down Performed on first and last piece of equipment    Resistance Training Performed Yes    VAD Patient? No    PAD/SET Patient? No      Pain Assessment   Currently in Pain? No/denies                Social History   Tobacco Use  Smoking Status Every Day   Current packs/day: 1.50   Average packs/day: 1.5 packs/day for 39.0 years (58.5 ttl pk-yrs)   Types: Cigarettes  Smokeless Tobacco Never  Tobacco Comments   2 cigarettes a day- 08/06/2022 khj    Goals Met:  Independence with exercise equipment Exercise tolerated well No report of concerns or symptoms today Strength training completed today  Goals Unmet:  Not Applicable  Comments: Pt able to follow exercise prescription today without complaint.  Will continue to monitor for progression.    Dr. Bethann Punches is Medical Director for [facility] Cardiac Rehabilitation.  Dr. Vida Rigger is Medical Director for [facility] Pulmonary Rehabilitation.

## 2022-11-29 ENCOUNTER — Encounter: Payer: Medicare Other | Admitting: *Deleted

## 2022-11-29 DIAGNOSIS — J449 Chronic obstructive pulmonary disease, unspecified: Secondary | ICD-10-CM

## 2022-11-29 NOTE — Progress Notes (Signed)
Daily Session Note  Patient Details  Name: [name] MRN: [id number] Date of Birth: [birth year] Referring Provider:   Flowsheet Row Pulmonary Rehab from 08/06/2022 in [name] Cardiac and Pulmonary Rehab  Referring Provider Dr. Sarina Ser, MD       Encounter Date: 11/29/2022  Check In:  Session Check In - 11/29/22 1721       Check-In   Supervising physician immediately available to respond to emergencies See telemetry face sheet for immediately available ER MD    Location [name]    Staff Present [name], RN BSN;[name], RCP,RRT,BSRT;Other   [name]   Virtual Visit No    Medication changes reported     No    Fall or balance concerns reported    No    Tobacco Cessation No Change    Current number of cigarettes/nicotine per day     2    Warm-up and Cool-down Performed on first and last piece of equipment    Resistance Training Performed Yes    VAD Patient? No    PAD/SET Patient? No      Pain Assessment   Currently in Pain? No/denies                Social History   Tobacco Use  Smoking Status Every Day   Current packs/day: 1.50   Average packs/day: 1.5 packs/day for 39.0 years (58.5 ttl pk-yrs)   Types: Cigarettes  Smokeless Tobacco Never  Tobacco Comments   2 cigarettes a day- 08/06/2022 khj    Goals Met:  Independence with exercise equipment Exercise tolerated well No report of concerns or symptoms today Strength training completed today  Goals Unmet:  Not Applicable  Comments: Pt able to follow exercise prescription today without complaint.  Will continue to monitor for progression.    Dr. Bethann Punches is Medical Director for [name] Cardiac Rehabilitation.  Dr. Vida Rigger is Medical Director for [name] Pulmonary Rehabilitation.

## 2022-12-03 ENCOUNTER — Encounter: Payer: BC Managed Care – PPO | Admitting: *Deleted

## 2022-12-03 DIAGNOSIS — J449 Chronic obstructive pulmonary disease, unspecified: Secondary | ICD-10-CM

## 2022-12-03 NOTE — Progress Notes (Signed)
Daily Session Note  Patient Details  Name: [name] MRN: [id number] Date of Birth: 11/10/1955 Referring Provider:   Flowsheet Row Pulmonary Rehab from 08/06/2022 in [facility] Cardiac and Pulmonary Rehab  Referring Provider Dr. Sarina Ser, MD       Encounter Date: 12/03/2022  Check In:  Session Check In - 12/03/22 1722       Check-In   Supervising physician immediately available to respond to emergencies See telemetry face sheet for immediately available ER MD    Location [name]    Staff Present [name], RN BSN;Joseph [place], RCP,RRT,BSRT;Other   Girtha Rm, MS   Virtual Visit No    Medication changes reported     No    Fall or balance concerns reported    No    Tobacco Cessation No Change    Current number of cigarettes/nicotine per day     2    Warm-up and Cool-down Performed on first and last piece of equipment    Resistance Training Performed Yes    VAD Patient? No    PAD/SET Patient? No      Pain Assessment   Currently in Pain? No/denies                Social History   Tobacco Use  Smoking Status Every Day   Current packs/day: 1.50   Average packs/day: 1.5 packs/day for 39.0 years (58.5 ttl pk-yrs)   Types: Cigarettes  Smokeless Tobacco Never  Tobacco Comments   2 cigarettes a day- 08/06/2022 khj    Goals Met:  Independence with exercise equipment Exercise tolerated well No report of concerns or symptoms today Strength training completed today  Goals Unmet:  Not Applicable  Comments: Pt able to follow exercise prescription today without complaint.  Will continue to monitor for progression.    Dr. Bethann Punches is Medical Director for [facility] Cardiac Rehabilitation.  Dr. Vida Rigger is Medical Director for [facility] Pulmonary Rehabilitation.

## 2022-12-06 ENCOUNTER — Encounter: Payer: Self-pay | Admitting: Pulmonary Disease

## 2022-12-06 ENCOUNTER — Ambulatory Visit (INDEPENDENT_AMBULATORY_CARE_PROVIDER_SITE_OTHER): Payer: BC Managed Care – PPO | Admitting: Pulmonary Disease

## 2022-12-06 ENCOUNTER — Encounter: Payer: BC Managed Care – PPO | Attending: Pulmonary Disease | Admitting: *Deleted

## 2022-12-06 VITALS — BP 124/80 | HR 69 | Temp 98.2°F | Ht 65.0 in | Wt 214.0 lb

## 2022-12-06 VITALS — Ht 65.0 in | Wt 214.0 lb

## 2022-12-06 DIAGNOSIS — R0602 Shortness of breath: Secondary | ICD-10-CM | POA: Insufficient documentation

## 2022-12-06 DIAGNOSIS — Z87891 Personal history of nicotine dependence: Secondary | ICD-10-CM

## 2022-12-06 DIAGNOSIS — E669 Obesity, unspecified: Secondary | ICD-10-CM

## 2022-12-06 DIAGNOSIS — G4736 Sleep related hypoventilation in conditions classified elsewhere: Secondary | ICD-10-CM

## 2022-12-06 DIAGNOSIS — J449 Chronic obstructive pulmonary disease, unspecified: Secondary | ICD-10-CM | POA: Diagnosis present

## 2022-12-06 NOTE — Progress Notes (Signed)
Daily Session Note  Patient Details  Name: [name] MRN: [id number] Date of Birth: 12-May-1955 Referring Provider:   Flowsheet Row Pulmonary Rehab from 08/06/2022 in [facility] Cardiac and Pulmonary Rehab  Referring Provider Dr. Sarina Ser, MD       Encounter Date: 12/06/2022  Check In:  Session Check In - 12/06/22 1705       Check-In   Supervising physician immediately available to respond to emergencies See telemetry face sheet for immediately available ER MD    Location [name]    Staff Present Elige Ko, RCP,RRT,BSRT;Other   [name] ad Jetta Lout   Virtual Visit No    Medication changes reported     No    Fall or balance concerns reported    No    Tobacco Cessation No Change    Current number of cigarettes/nicotine per day     2    Warm-up and Cool-down Performed on first and last piece of equipment    Resistance Training Performed Yes    VAD Patient? No    PAD/SET Patient? No      Pain Assessment   Currently in Pain? No/denies                Social History   Tobacco Use  Smoking Status Former   Current packs/day: 1.50   Average packs/day: 1.5 packs/day for 39.0 years (58.5 ttl pk-yrs)   Types: Cigarettes  Smokeless Tobacco Never  Tobacco Comments   Quit smoking cigarettes 10/20/2022 khj    Goals Met:  Independence with exercise equipment Exercise tolerated well No report of concerns or symptoms today Strength training completed today  Goals Unmet:  Not Applicable  Comments:   6 Minute Walk     Row Name 08/06/22 1043 12/06/22 1728       6 Minute Walk   Phase Initial Discharge    Distance 1280 feet 1500 feet    Distance % Change -- 17 %    Distance Feet Change -- 220 ft    Walk Time 6 minutes 6 minutes    # of Rest Breaks 0 0    MPH 2.42 2.84    METS 2.86 3.56    RPE 11 12    Perceived Dyspnea  1 1    VO2 Peak 10.01 12.46    Symptoms Yes (comment) No    Comments Chronic Back Pain 9/10 --    Resting HR  68 bpm 73 bpm    Resting BP 126/74 118/70    Resting Oxygen Saturation  98 % 95 %    Exercise Oxygen Saturation  during 6 min walk 93 % 89 %    Max Ex. HR 105 bpm 127 bpm    Max Ex. BP 160/80 164/72    2 Minute Post BP 144/78 146/90      Interval HR   1 Minute HR 88 106    2 Minute HR 97 113    3 Minute HR 97 115    4 Minute HR 102 121    5 Minute HR 104 118    6 Minute HR 105 127    2 Minute Post HR 70 87    Interval Heart Rate? Yes Yes      Interval Oxygen   Interval Oxygen? Yes Yes    Baseline Oxygen Saturation % 98 % 95 %    1 Minute Oxygen Saturation % 93 % 93 %    1 Minute Liters  of Oxygen 0 L 0 L    2 Minute Oxygen Saturation % 94 % 90 %    2 Minute Liters of Oxygen 0 L 0 L    3 Minute Oxygen Saturation % 94 % 91 %    3 Minute Liters of Oxygen 0 L 0 L    4 Minute Oxygen Saturation % 94 % 90 %    4 Minute Liters of Oxygen 0 L 0 L    5 Minute Oxygen Saturation % 93 % 89 %    5 Minute Liters of Oxygen 0 L 0 L    6 Minute Oxygen Saturation % 95 % 90 %    6 Minute Liters of Oxygen 0 L 0 L    2 Minute Post Oxygen Saturation % 96 % 90 %    2 Minute Post Liters of Oxygen 0 L 0 L            Pt able to follow exercise prescription today without complaint.  Will continue to monitor for progression.    Dr. Bethann Punches is Medical Director for [facility] Cardiac Rehabilitation.  Dr. Vida Rigger is Medical Director for [facility] Pulmonary Rehabilitation.

## 2022-12-06 NOTE — Patient Instructions (Signed)
Discharge Patient Instructions  Patient Details  Name: [name] MRN: [id number] Date of Birth: 09/26/1955 Referring Provider:  Dorcas Carrow, DO   Number of Visits: 36  Reason for Discharge:  Patient reached a stable level of exercise. Patient independent in their exercise. Patient has met program and personal goals.  Smoking History:  Social History   Tobacco Use  Smoking Status Former   Current packs/day: 1.50   Average packs/day: 1.5 packs/day for 39.0 years (58.5 ttl pk-yrs)   Types: Cigarettes  Smokeless Tobacco Never  Tobacco Comments   Quit smoking cigarettes 10/20/2022 khj    Diagnosis:  Shortness of breath  Chronic obstructive pulmonary disease, unspecified COPD type (HCC)  Initial Exercise Prescription:  Initial Exercise Prescription - 08/06/22 1100       Date of Initial Exercise RX and Referring Provider   Date 08/06/22    Referring Provider Dr. Sarina Ser, MD      Oxygen   Maintain Oxygen Saturation 88% or higher      Treadmill   MPH 2.4    Grade 0    Minutes 15    METs 2.84      Recumbant Bike   Level 2    RPM 50    Watts 26    Minutes 15    METs 2.86      NuStep   Level 2    SPM 80    Minutes 15    METs 2.86      Biostep-RELP   Level 2    SPM 50    Minutes 15    METs 2.86      Prescription Details   Frequency (times per week) 2    Duration Progress to 30 minutes of continuous aerobic without signs/symptoms of physical distress      Intensity   THRR 40-80% of Max Heartrate 102-136    Ratings of Perceived Exertion 11-13    Perceived Dyspnea 0-4      Progression   Progression Continue to progress workloads to maintain intensity without signs/symptoms of physical distress.      Resistance Training   Training Prescription Yes    Weight 4 lb    Reps 10-15             Discharge Exercise Prescription (Final Exercise Prescription Changes):  Exercise Prescription Changes - 12/06/22 0700       Response to  Exercise   Blood Pressure (Admit) 122/80    Blood Pressure (Exit) 128/80    Heart Rate (Admit) 76 bpm    Heart Rate (Exercise) 105 bpm    Heart Rate (Exit) 84 bpm    Oxygen Saturation (Admit) 96 %    Oxygen Saturation (Exercise) 91 %    Oxygen Saturation (Exit) 94 %    Rating of Perceived Exertion (Exercise) 12    Perceived Dyspnea (Exercise) 1    Symptoms none    Duration Continue with 30 min of aerobic exercise without signs/symptoms of physical distress.    Intensity THRR unchanged      Progression   Progression Continue to progress workloads to maintain intensity without signs/symptoms of physical distress.    Average METs 3.34      Resistance Training   Training Prescription Yes    Weight 4 lb    Reps 10-15      Interval Training   Interval Training No      Treadmill   MPH 2.7    Grade 0  Minutes 15    METs 3.07      Recumbant Bike   Level 5    Watts 41    Minutes 15    METs 3.34      NuStep   Level 4    Minutes 15    METs 3.1      Biostep-RELP   Level 4    Minutes 15    METs 4      Home Exercise Plan   Plans to continue exercise at Home (comment)   Walking twice a week   Frequency Add 3 additional days to program exercise sessions.    Initial Home Exercises Provided 10/04/22      Oxygen   Maintain Oxygen Saturation 88% or higher             Functional Capacity:  6 Minute Walk     Row Name 08/06/22 1043 12/06/22 1728       6 Minute Walk   Phase Initial Discharge    Distance 1280 feet 1500 feet    Distance % Change -- 17 %    Distance Feet Change -- 220 ft    Walk Time 6 minutes 6 minutes    # of Rest Breaks 0 0    MPH 2.42 2.84    METS 2.86 3.56    RPE 11 12    Perceived Dyspnea  1 1    VO2 Peak 10.01 12.46    Symptoms Yes (comment) No    Comments Chronic Back Pain 9/10 --    Resting HR 68 bpm 73 bpm    Resting BP 126/74 118/70    Resting Oxygen Saturation  98 % 95 %    Exercise Oxygen Saturation  during 6 min walk 93 % 89 %     Max Ex. HR 105 bpm 127 bpm    Max Ex. BP 160/80 164/72    2 Minute Post BP 144/78 146/90      Interval HR   1 Minute HR 88 106    2 Minute HR 97 113    3 Minute HR 97 115    4 Minute HR 102 121    5 Minute HR 104 118    6 Minute HR 105 127    2 Minute Post HR 70 87    Interval Heart Rate? Yes Yes      Interval Oxygen   Interval Oxygen? Yes Yes    Baseline Oxygen Saturation % 98 % 95 %    1 Minute Oxygen Saturation % 93 % 93 %    1 Minute Liters of Oxygen 0 L 0 L    2 Minute Oxygen Saturation % 94 % 90 %    2 Minute Liters of Oxygen 0 L 0 L    3 Minute Oxygen Saturation % 94 % 91 %    3 Minute Liters of Oxygen 0 L 0 L    4 Minute Oxygen Saturation % 94 % 90 %    4 Minute Liters of Oxygen 0 L 0 L    5 Minute Oxygen Saturation % 93 % 89 %    5 Minute Liters of Oxygen 0 L 0 L    6 Minute Oxygen Saturation % 95 % 90 %    6 Minute Liters of Oxygen 0 L 0 L    2 Minute Post Oxygen Saturation % 96 % 90 %    2 Minute Post Liters of Oxygen 0 L 0 L  Exercise Goals Re-Evaluation:  Exercise Goals Re-Evaluation     Row Name 08/13/22 1722 08/15/22 1801 08/27/22 1315 09/10/22 1158 09/12/22 1755     Exercise Goal Re-Evaluation   Exercise Goals Review Increase Physical Activity;Able to understand and use rate of perceived exertion (RPE) scale;Knowledge and understanding of Target Heart Rate Range (THRR);Understanding of Exercise Prescription;Increase Strength and Stamina;Able to understand and use Dyspnea scale;Able to check pulse independently Increase Physical Activity;Increase Strength and Stamina;Understanding of Exercise Prescription Increase Physical Activity;Increase Strength and Stamina;Understanding of Exercise Prescription Increase Physical Activity;Increase Strength and Stamina;Understanding of Exercise Prescription Increase Physical Activity;Increase Strength and Stamina;Understanding of Exercise Prescription   Comments Reviewed RPE scale, THR and program  prescription with pt today.  Pt voiced understanding and was given a copy of goals to take home. [name] did well for her first session of rehab.  She was able to do level 2 on the Nustep and 1.9 mph on the treadmill. Her RPEs are appropriate  with exercise. We will continue to monitor as she continues to progress in the program. [name] continues to do well in the program. She recently increased her treadmill speed to 2.1 mph with no incline. She also began using the recumbent bike and did well with level 2. She has continued to use 4 lb hand weights for resistance training as well. We will continue to monitor her progress in the program. [name] continues to do well in rehab. She increased her treadmill speed to 2.4 mph. She could benefit from adding an incline to her prescription. She also increased to level 3 on the recumbent bike and T4 Nustep. All with appropriate RPEs.She continues to reach her THR most sessions. Will continue to monitor. Patient reports that she is procressing with her exercise in class and continue to increase her work loads when tolerated. She has not been exercising at home but packing boxes. She has some frustration that she can not do things at home such as lifiting and walking like she used to be able to do. Time prohibited going over home exercise guidelines today but this will be done in the near future with program staff.   Expected Outcomes Short: Use RPE daily to regulate intensity.  Long: Follow program prescription in THR. Short: Continue to exercise at initial exercise prescription Long: Increase overall MET level and stamina Short: Try to add incline to treadmill workload. Long: Continue to improve strength and stamina. Short:Add small incline to treadmill Long: Continue to increase overall MET level and stamina Short: go over home exercise guidelines with staff memeber. Long: become independent with exercise program.    Row Name 09/24/22 1354 10/04/22 1818 10/08/22 1448 10/22/22 1408  11/09/22 0828     Exercise Goal Re-Evaluation   Exercise Goals Review Increase Physical Activity;Increase Strength and Stamina;Understanding of Exercise Prescription Increase Physical Activity;Increase Strength and Stamina;Able to understand and use rate of perceived exertion (RPE) scale;Able to understand and use Dyspnea scale;Able to check pulse independently;Knowledge and understanding of Target Heart Rate Range (THRR);Understanding of Exercise Prescription Increase Physical Activity;Increase Strength and Stamina;Understanding of Exercise Prescription Increase Physical Activity;Increase Strength and Stamina;Understanding of Exercise Prescription Increase Physical Activity;Increase Strength and Stamina;Understanding of Exercise Prescription   Comments [name] continues to do well in rehab. She continues to walk on the treadmill at a speed of 2.4 mph with an incline of 0%. She also improved to level 5 on the recumbent bike and level 4 on the T4 nustep. She has conitnued to use 3 lb hand weights for  resistance training as well. We will continue to monitor her progress in the program. Reviewed home exercise with pt today.  Pt plans to walk at home for exercise.  Reviewed THR, pulse, RPE, sign and symptoms, pulse oximetery and when to call 911 or MD.  Also discussed weather considerations and indoor options.  Pt voiced understanding. [name] is doing well in rehab.  She [place] up to level 3 on the BioStep.  Her RPE is staying around 11 so we will encourage her to push her spm or increase her workload to aim for 12-13 on RPE. [name] is doing well in rehab. She continues to work at level 4 on the T4 nustep and level 5 on the recumbent bike. She also began using the XR at level and did well. She continues to do well walking on the treadmill at a speed of 2.4 mph with no incline as well. We will continue to monitor her progress in the program. [name] is doing well in rehab. She improved her workloads on the biostep, T4 nustep, and  recumbent bike. She also increased her treadmill speed to 2.6 mph with no incline. She continues to do well with 4 lb hand weights for resistance training as well. We will continue to monitor her progress in the program.   Expected Outcomes Short: Continue to progressively increase treadmill workload. Long: Continue to improve strength and stamina. Short: Begin walking at home twice a week in addition to rehab. Long: Continue to exercise independently. Short: Increase RPM or level on equipment for RPE 12-13 Long: Continue to improve stamina Short: Add incline to treadmill workload. Long: Continue to improve strength and stamina. Short: Try 5 lb hand weights for resistance training. Long: Continue to improve strength and stamina.    Row Name 11/22/22 0747 12/06/22 0747           Exercise Goal Re-Evaluation   Exercise Goals Review Increase Physical Activity;Increase Strength and Stamina;Understanding of Exercise Prescription Increase Physical Activity;Increase Strength and Stamina;Understanding of Exercise Prescription      Comments [name] continues to do well in rehab. She recently increased her treadmill speed to 2.7 mph with no incline. She also has continued to work at level 4 on both the T4 nustep and biostep, and level 5 on the recumbent bike. She has done well with 4 lb hand weights for resistance training as well. We will continue to monitor her progress in the program. [name] continues to do well in rehab. She has stayed consistent with her treadmill speed at 2.7 mph with no incline. She also has continued to work at level 4 on both the T4 nustep and biostep, and level 5 on the recumbent bike. She continues to do well with 4 lb hand weights for resistance training as well. We will continue to monitor her progress in the program.      Expected Outcomes Short: Try 5 lb hand weights for resistance training. Long: Continue exercise to improve strength and stamina. Short: Add incline to treadmill workload.  Long: Continue exercise to improve strength and stamina.              Nutrition & Weight - Outcomes:  Pre Biometrics - 08/06/22 1051       Pre Biometrics   Height 5\' 5"  (1.651 m)    Weight 215 lb 9.6 oz (97.8 kg)    Waist Circumference 41.5 inches    Hip Circumference 43 inches    Waist to Hip Ratio 0.97 %  BMI (Calculated) 35.88    Single Leg Stand 30 seconds             Post Biometrics - 12/06/22 1734        Post  Biometrics   Height 5\' 5"  (1.651 m)    Weight 214 lb (97.1 kg)    BMI (Calculated) 35.61    Single Leg Stand 19.66 seconds            Education Questionnaire Score:  Knowledge Questionnaire Score - 08/06/22 1031       Knowledge Questionnaire Score   Pre Score 14/18            Goals reviewed with patient; copy given to patient.

## 2022-12-06 NOTE — Progress Notes (Signed)
Subjective:    [name], female    DOB: [birth year], 67 y.o.   MRN: [id number]  Patient Care Team: Dorcas Carrow, DO as PCP - General (Family Medicine) Bridgett Larsson, LCSW as Social Worker (Licensed Clinical Social Worker)  Chief Complaint  Patient presents with   Follow-up    DOE. No wheezing or cough. Not using CPAP or O2 at night.    HPI [name] is a 67 year old recent former smoker with a 40-pack-year history of smoking and a history as noted below who follows for the issue of shortness of breath.  She was last seen here on 06 August 2022.  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  Initial impressions were that her dyspnea was likely related to obesity/deconditioning.  She also underwent a home sleep study due to prior diagnosis of sleep apnea that was untreated.  She had a home sleep study performed on 26 January that showed severe sleep apnea with an AHI of 40.  She initially was compliant with CPAP however had a break in therapy and had to be restudied.  She had an in lab study performed on 09 October 2022 that showed no sleep apnea however she did have significant nocturnal desaturation to 83% and the recommendation was for 1 L/min of oxygen nocturnally.  The patient has not been using CPAP nor oxygen since her last visit.  She has been engaged in pulmonary rehab and feels that this helps her.  She has been on Stiolto 2 puffs daily and feels that this inhaler helps her.  She has noted that she has rarely to use her albuterol rescue inhaler. She follows with cardiology for atrial fibrillation which is paroxysmal in nature.  She is on Rythmol which is also associated with dyspnea.  Since her prior visit she has not had any issues with fevers, chills or sweats.  No cough or sputum production.  No wheezing.  Only dyspnea on exertion and by her own admission, getting better.  She has not had lower extremity edema, no calf tenderness.  We discussed the  importance of being compliant with nocturnal oxygen as persistent nocturnal hypoxemia can have significant cardiovascular consequences.    DATA 12/26/2021 PFTs: FEV1 2.41 L or 95% predicted, FVC 3.27 L or 99% predicted FEV1/FVC 74%, lung volumes low normal.  Postbronchodilator study not done.  Diffusion capacity mildly reduced.  There is curvature of the flow-volume loop consistent with mild obstruction. 12/26/2021 echocardiogram: LVEF 60 to 65%, no regional wall motion abnormalities, grade 1 DD, RV function normal, LA size moderately dilated, mild mitral regurg.  No mitral stenosis.  No aortic valve abnormalities. 06/01/2022 [facility]:QIONGE obstructive sleep apnea with AHI of 40 and SpO2 low of 79%. 08/02/2022 LDCT chest: Centrilobular and paraseptal emphysema evident.  Tiny bilateral pulmonary nodules maximum size of 4.1 mm no suspicious pulmonary nodule or mass.  No focal airspace consolidation.  Lung RADS 2, benign appearance and behavior, continue yearly monitoring. 10/09/2022 in lab sleep study: No evidence of sleep apnea!  AHI 0.4, supine AHI 2.3, patient was noted to have nocturnal hypoxemia likely related to COPD/obesity O2 nadir 83% recommended 1 L/min supplemental oxygen.  Some PLMS with arousals.  Review of Systems A 10 point review of systems was performed and it is as noted above otherwise negative.   Patient Active Problem List   Diagnosis Date Noted   Homelessness 09/22/2021   Advance directive discussed with patient 01/30/2021   Scoliosis 08/02/2015  Chronic fatigue 02/06/2015   CKD (chronic kidney disease) stage 3, GFR 30-59 ml/min (HCC) 02/06/2015   Nasal polyp, posterior 02/03/2015   Muscle spasms of head and/or neck 11/11/2014   Neck pain of over 3 months duration 10/21/2014   GERD (gastroesophageal reflux disease)    Hypothyroidism    Migraines    Depression, major, recurrent, moderate (HCC) 07/20/2014    Class: Chronic   OSA (obstructive sleep apnea) 02/15/2014    Atrial fibrillation (HCC) 08/29/2012   Dyspnea on exertion 08/29/2012   DEGENERATIVE JOINT DISEASE 02/25/2008   Hyperlipidemia 01/29/2007   COPD (chronic obstructive pulmonary disease) (HCC) 01/29/2007   Anxiety state 01/28/2007   Tobacco abuse 01/28/2007   OVARIAN CYST, LEFT 05/14/2006    Social History   Tobacco Use   Smoking status: Former    Current packs/day: 1.50    Average packs/day: 1.5 packs/day for 39.0 years (58.5 ttl pk-yrs)    Types: Cigarettes   Smokeless tobacco: Never   Tobacco comments:    Quit smoking cigarettes 10/20/2022 khj  Substance Use Topics   Alcohol use: Yes    Alcohol/week: 0.0 standard drinks of alcohol    Comment: Rare    Allergies  Allergen Reactions   Bee Venom Anaphylaxis   Penicillins Rash   Sulfonamide Derivatives Rash   Cardizem  [Diltiazem Hcl] Other (See Comments)    Other Reaction: severe hypotension w/ IV dose   Sulfa Antibiotics Hives   Tape Rash    Uncoded Allergy. Allergen: tape & bandaids    Current Meds  Medication Sig   albuterol (VENTOLIN HFA) 108 (90 Base) MCG/ACT inhaler Inhale 2 puffs into the lungs every 6 (six) hours as needed for wheezing or shortness of breath.   atorvastatin (LIPITOR) 40 MG tablet Take 1 tablet (40 mg total) by mouth daily.   citalopram (CELEXA) 40 MG tablet Take 1.5 tablets (60 mg total) by mouth daily.   furosemide (LASIX) 20 MG tablet Take 1 tablet (20 mg total) by mouth 3 (three) times a week.   levothyroxine (SYNTHROID) 125 MCG tablet Take 1 tablet (125 mcg total) by mouth daily with breakfast.   pantoprazole (PROTONIX) 20 MG tablet Take 1 tablet (20 mg total) by mouth daily.   propafenone (RYTHMOL) 225 MG tablet TAKE 1 TABLET BY MOUTH DAILY AS NEEDED FOR AFIB   Spacer/Aero-Holding Chambers DEVI 1 Device by Does not apply route as needed.   Tiotropium Bromide-Olodaterol (STIOLTO RESPIMAT) 2.5-2.5 MCG/ACT AERS Inhale 2 puffs into the lungs daily.    Immunization History  Administered Date(s)  Administered   [employer] Vaccination 05/10/2020   PFIZER(Purple Top)SARS-COV-2 Vaccination 08/18/2019, 09/05/2019   Pneumococcal Conjugate-13 01/30/2021   Td 05/08/1995, 12/14/2008   Tdap 11/20/2019        Objective:   BP 124/80 (BP Location: Left Arm, Cuff Size: Large)   Pulse 69   Temp 98.2 F (36.8 C)   Ht 5\' 5"  (1.651 m)   Wt 214 lb (97.1 kg)   SpO2 99%   BMI 35.61 kg/m   SpO2: 99 % O2 Device: None (Room air)  GENERAL: Obese woman, no acute distress, fully ambulatory, no conversational dyspnea. HEAD: Normocephalic, atraumatic.  EYES: Pupils equal, round, reactive to light.  No scleral icterus.  MOUTH: Poor dentition, missing teeth, chipped, Mallampati class III. NECK: Supple. No thyromegaly. Trachea midline. No JVD.  No adenopathy. PULMONARY: Good air entry bilaterally.  No adventitious sounds. CARDIOVASCULAR: S1 and S2. Regular rate and rhythm.  No rubs, murmurs or gallops  heard. ABDOMEN: Obese otherwise, benign. MUSCULOSKELETAL: Significant kyphosis noted no joint deformity, no clubbing, no edema.  NEUROLOGIC: No overt focal deficit, no gait disturbance, speech is fluent. SKIN: Intact,warm,dry. PSYCH: Mood and behavior normal.   Assessment & Plan:     ICD-10-CM   1. Stage 1 mild COPD by GOLD classification (HCC)  J44.9    Continue Stiolto 2 puffs daily Continue as needed albuterol    2. Nocturnal hypoxemia due to obesity  E66.9    G47.36    Instructed to use O2 at 1 L/min Discussed cardiovascular consequences of nocturnal hypoxemia    3. SOB (shortness of breath)  R06.02    Improved with pulmonary rehab Weight loss recommended Patient quit smoking    4. Former smoker  Z87.891    No relapse Recent (6/15)    5. Obesity (BMI 35.0-39.9 without comorbidity)  E66.9    Weight loss recommended     Will see the patient in follow-up in 4 to 6 months time she is to contact us prior to that time should any new difficulties arise.  Gailen Shelter, MD Advanced Bronchoscopy [facility]    *This note was dictated using voice recognition software/Dragon.  Despite best efforts to proofread, errors can occur which can change the meaning. Any transcriptional errors that result from this process are unintentional and may not be fully corrected at the time of dictation.

## 2022-12-06 NOTE — Patient Instructions (Signed)
You have nocturnal hypoxemia, you need to continue using oxygen at nighttime 1 L/min seems to maintain you well.  Continue the Stiolto 2 puffs daily.  I am glad that the pulmonary rehab has helped you.  We will see her in follow-up in 4 to 6 months time call sooner should any new problems arise.

## 2022-12-08 ENCOUNTER — Encounter: Payer: Self-pay | Admitting: Pulmonary Disease

## 2022-12-10 ENCOUNTER — Encounter: Payer: BC Managed Care – PPO | Admitting: *Deleted

## 2022-12-10 DIAGNOSIS — J449 Chronic obstructive pulmonary disease, unspecified: Secondary | ICD-10-CM

## 2022-12-10 DIAGNOSIS — R0602 Shortness of breath: Secondary | ICD-10-CM | POA: Diagnosis not present

## 2022-12-10 NOTE — Progress Notes (Signed)
Daily Session Note  Patient Details  Name: [name] MRN: [id number] Date of Birth: 1956-01-31 Referring Provider:   Flowsheet Row Pulmonary Rehab from 08/06/2022 in [facility]  Referring Provider Dr. Sarina Ser, MD       Encounter Date: 12/10/2022  Check In:  Session Check In - 12/10/22 1738       Check-In   Supervising physician immediately available to respond to emergencies See telemetry face sheet for immediately available ER MD    Location [name]    Staff Present [name], RN Mabeline Caras, BS, ACSM CEP, Exercise Physiologist;Joseph Reino [place], [place]    Virtual Visit No    Medication changes reported     No    Fall or balance concerns reported    No    Tobacco Cessation No Change    Current number of cigarettes/nicotine per day     2    Warm-up and Cool-down Performed on first and last piece of equipment    Resistance Training Performed Yes    VAD Patient? No    PAD/SET Patient? No      Pain Assessment   Currently in Pain? No/denies                Social History   Tobacco Use  Smoking Status Former   Current packs/day: 1.50   Average packs/day: 1.5 packs/day for 39.0 years (58.5 ttl pk-yrs)   Types: Cigarettes  Smokeless Tobacco Never  Tobacco Comments   Quit smoking cigarettes 10/20/2022 khj    Goals Met:  Independence with exercise equipment Exercise tolerated well No report of concerns or symptoms today Strength training completed today  Goals Unmet:  Not Applicable  Comments: Pt able to follow exercise prescription today without complaint.  Will continue to monitor for progression.    Dr. Bethann Punches is Medical Director for [facility].  Dr. Vida Rigger is Medical Director for [facility].

## 2022-12-12 ENCOUNTER — Encounter: Payer: Self-pay | Admitting: *Deleted

## 2022-12-12 DIAGNOSIS — R0602 Shortness of breath: Secondary | ICD-10-CM

## 2022-12-12 DIAGNOSIS — J449 Chronic obstructive pulmonary disease, unspecified: Secondary | ICD-10-CM

## 2022-12-12 NOTE — Progress Notes (Signed)
Pulmonary Individual Treatment Plan  Patient Details  Name: [name] MRN: [id number] Date of Birth: Mar 13, 1956 Referring Provider:   Flowsheet Row Pulmonary Rehab from 08/06/2022 in [facility] Re[facility]  Referring Provider Dr. Sarina Ser, MD       Initial Encounter Date:  Flowsheet Row Pulmonary Rehab from 08/06/2022 in [facility] Cardiac and Pulmonary Rehab  Date 08/06/22       Visit Diagnosis: Shortness of breath  Chronic obstructive pulmonary disease, unspecified COPD type (HCC)  Patient's Home Medications on Admission:  Current Outpatient Medications:    albuterol (VENTOLIN HFA) 108 (90 Base) MCG/ACT inhaler, Inhale 2 puffs into the lungs every 6 (six) hours as needed for wheezing or shortness of breath., Disp: 8 g, Rfl: 2   atorvastatin (LIPITOR) 40 MG tablet, Take 1 tablet (40 mg total) by mouth daily., Disp: 90 tablet, Rfl: 1   citalopram (CELEXA) 40 MG tablet, Take 1.5 tablets (60 mg total) by mouth daily., Disp: 135 tablet, Rfl: 1   furosemide (LASIX) 20 MG tablet, Take 1 tablet (20 mg total) by mouth 3 (three) times a week., Disp: 45 tablet, Rfl: 3   levothyroxine (SYNTHROID) 125 MCG tablet, Take 1 tablet (125 mcg total) by mouth daily with breakfast., Disp: 90 tablet, Rfl: 3   pantoprazole (PROTONIX) 20 MG tablet, Take 1 tablet (20 mg total) by mouth daily., Disp: 90 tablet, Rfl: 3   propafenone (RYTHMOL) 225 MG tablet, TAKE 1 TABLET BY MOUTH DAILY AS NEEDED FOR AFIB, Disp: 30 tablet, Rfl: 1   Spacer/Aero-Holding Chambers DEVI, 1 Device by Does not apply route as needed., Disp: 2 each, Rfl: 0   Tiotropium Bromide-Olodaterol (STIOLTO RESPIMAT) 2.5-2.5 MCG/ACT AERS, Inhale 2 puffs into the lungs daily., Disp: 4 g, Rfl: 5 No current facility-administered medications for this visit.  Facility-Administered Medications Ordered in Other Visits:    albuterol (PROVENTIL) (2.5 MG/3ML) 0.083% nebulizer solution 2.5 mg, 2.5 mg, Nebulization, Once, Duke Salvia,  MD  Past Medical History: Past Medical History:  Diagnosis Date   Anxiety state, unspecified    Back pain    Chest pain    a. 2008 Cath: reportedly nl;  b. 09/2012 Lexi MV: EF 75%, soft tissue attenuation->Low risk.   COPD (chronic obstructive pulmonary disease) (HCC)    CTS (carpal tunnel syndrome)    Depression    GERD (gastroesophageal reflux disease)    Migraines    Neck pain    Nontoxic multinodular goiter    Other and unspecified hyperlipidemia    Other and unspecified ovarian cyst    left   Other chest pain    PAF (paroxysmal atrial fibrillation) (HCC)    a. 07/2007 Echo: EF 65%, mildly dil LA;  b. currently on propafenone;  c. CHA2DS2VASc = 1 (gender)-->No anticoagulation.   PTSD (post-traumatic stress disorder)    Tobacco use    Tobacco use disorder    Unspecified hypothyroidism    Unspecified sleep apnea    resolved s/p UPPP surgery   Vitamin B12 deficiency     Tobacco Use: Social History   Tobacco Use  Smoking Status Former   Current packs/day: 1.50   Average packs/day: 1.5 packs/day for 39.0 years (58.5 ttl pk-yrs)   Types: Cigarettes  Smokeless Tobacco Never  Tobacco Comments   Quit smoking cigarettes 10/20/2022 khj    Labs: Review Flowsheet  More data exists      Latest Ref Rng & Units 10/09/2017 11/20/2019 01/30/2021 03/26/2022 09/24/2022  Labs for ITP Cardiac and  Pulmonary Rehab  Cholestrol 100 - 199 mg/dL 347  425  956  387  564   LDL (calc) 0 - 99 mg/dL 332  951  82  884  166   HDL-C >39 mg/dL 45  41  39  55  45   Trlycerides 0 - 149 mg/dL 063  016  010  932  355   Hemoglobin A1c 4.8 - 5.6 % - - 5.5  - -    Details             Pulmonary Assessment Scores:  Pulmonary Assessment Scores     Row Name 08/06/22 1034 12/06/22 1735       ADL UCSD   ADL Phase Entry Exit    SOB Score total 39 --    Rest 0 --    Walk 3 --    Stairs 5 --    Bath 1 --    Dress 1 --    Shop 1 --      CAT Score   CAT Score 8 --      mMRC Score   mMRC  Score 1 0             UCSD: Self-administered rating of dyspnea associated with activities of daily living (ADLs) 6-point scale (0 = "not at all" to 5 = "maximal or unable to do because of breathlessness")  Scoring Scores range from 0 to 120.  Minimally important difference is 5 units  CAT: CAT can identify the health impairment of COPD patients and is better correlated with disease progression.  CAT has a scoring range of zero to 40. The CAT score is classified into four groups of low (less than 10), medium (10 - 20), high (21-30) and very high (31-40) based on the impact level of disease on health status. A CAT score over 10 suggests significant symptoms.  A worsening CAT score could be explained by an exacerbation, poor medication adherence, poor inhaler technique, or progression of COPD or comorbid conditions.  CAT MCID is 2 points  mMRC: mMRC (Modified Medical Research Council) Dyspnea Scale is used to assess the degree of baseline functional disability in patients of respiratory disease due to dyspnea. No minimal important difference is established. A decrease in score of 1 point or greater is considered a positive change.   Pulmonary Function Assessment:   Exercise Target Goals: Exercise Program Goal: Individual exercise prescription set using results from initial 6 min walk test and THRR while considering  patient's activity barriers and safety.   Exercise Prescription Goal: Initial exercise prescription builds to 30-45 minutes a day of aerobic activity, 2-3 days per week.  Home exercise guidelines will be given to patient during program as part of exercise prescription that the participant will acknowledge.  Education: Aerobic Exercise: - Group verbal and visual presentation on the components of exercise prescription. Introduces F.I.T.T principle from ACSM for exercise prescriptions.  Reviews F.I.T.T. principles of aerobic exercise including progression. Written material  given at graduation. Flowsheet Row Pulmonary Rehab from 09/12/2022 in [facility] Cardiac and Pulmonary Rehab  Education need identified 08/06/22       Education: Resistance Exercise: - Group verbal and visual presentation on the components of exercise prescription. Introduces F.I.T.T principle from ACSM for exercise prescriptions  Reviews F.I.T.T. principles of resistance exercise including progression. Written material given at graduation.    Education: Exercise & Equipment Safety: - Individual verbal instruction and demonstration of equipment use and safety with use of the equipment. Flowsheet  Row Pulmonary Rehab from 09/12/2022 in [facility] Cardiac and Pulmonary Rehab  Date 08/06/22  Educator NT  Instruction Review Code 1- Verbalizes Understanding       Education: Exercise Physiology & General Exercise Guidelines: - Group verbal and written instruction with models to review the exercise physiology of the cardiovascular system and associated critical values. Provides general exercise guidelines with specific guidelines to those with heart or lung disease.    Education: Flexibility, Balance, Mind/Body Relaxation: - Group verbal and visual presentation with interactive activity on the components of exercise prescription. Introduces F.I.T.T principle from ACSM for exercise prescriptions. Reviews F.I.T.T. principles of flexibility and balance exercise training including progression. Also discusses the mind body connection.  Reviews various relaxation techniques to help reduce and manage stress (i.e. Deep breathing, progressive muscle relaxation, and visualization). Balance handout provided to take home. Written material given at graduation.   Activity Barriers & Risk Stratification:  Activity Barriers & Cardiac Risk Stratification - 08/06/22 1050       Activity Barriers & Cardiac Risk Stratification   Activity Barriers Back Problems;Shortness of Breath;Joint Problems             6 Minute  Walk:  6 Minute Walk     Row Name 08/06/22 1043 12/06/22 1728       6 Minute Walk   Phase Initial Discharge    Distance 1280 feet 1500 feet    Distance % Change -- 17 %    Distance Feet Change -- 220 ft    Walk Time 6 minutes 6 minutes    # of Rest Breaks 0 0    MPH 2.42 2.84    METS 2.86 3.56    RPE 11 12    Perceived Dyspnea  1 1    VO2 Peak 10.01 12.46    Symptoms Yes (comment) No    Comments Chronic Back Pain 9/10 --    Resting HR 68 bpm 73 bpm    Resting BP 126/74 118/70    Resting Oxygen Saturation  98 % 95 %    Exercise Oxygen Saturation  during 6 min walk 93 % 89 %    Max Ex. HR 105 bpm 127 bpm    Max Ex. BP 160/80 164/72    2 Minute Post BP 144/78 146/90      Interval HR   1 Minute HR 88 106    2 Minute HR 97 113    3 Minute HR 97 115    4 Minute HR 102 121    5 Minute HR 104 118    6 Minute HR 105 127    2 Minute Post HR 70 87    Interval Heart Rate? Yes Yes      Interval Oxygen   Interval Oxygen? Yes Yes    Baseline Oxygen Saturation % 98 % 95 %    1 Minute Oxygen Saturation % 93 % 93 %    1 Minute Liters of Oxygen 0 L 0 L    2 Minute Oxygen Saturation % 94 % 90 %    2 Minute Liters of Oxygen 0 L 0 L    3 Minute Oxygen Saturation % 94 % 91 %    3 Minute Liters of Oxygen 0 L 0 L    4 Minute Oxygen Saturation % 94 % 90 %    4 Minute Liters of Oxygen 0 L 0 L    5 Minute Oxygen Saturation % 93 % 89 %  5 Minute Liters of Oxygen 0 L 0 L    6 Minute Oxygen Saturation % 95 % 90 %    6 Minute Liters of Oxygen 0 L 0 L    2 Minute Post Oxygen Saturation % 96 % 90 %    2 Minute Post Liters of Oxygen 0 L 0 L            Oxygen Initial Assessment:  Oxygen Initial Assessment - 07/26/22 1333       Home Oxygen   Home Oxygen Device None    Sleep Oxygen Prescription None    Home Exercise Oxygen Prescription None    Home Resting Oxygen Prescription None    Compliance with Home Oxygen Use Yes      Initial 6 min Walk   Oxygen Used None      Program  Oxygen Prescription   Program Oxygen Prescription None      Intervention   Short Term Goals To learn and understand importance of monitoring SPO2 with pulse oximeter and demonstrate accurate use of the pulse oximeter.;To learn and understand importance of maintaining oxygen saturations>88%;To learn and demonstrate proper pursed lip breathing techniques or other breathing techniques. ;To learn and demonstrate proper use of respiratory medications    Long  Term Goals Verbalizes importance of monitoring SPO2 with pulse oximeter and return demonstration;Maintenance of O2 saturations>88%;Exhibits proper breathing techniques, such as pursed lip breathing or other method taught during program session;Compliance with respiratory medication;Demonstrates proper use of MDI's             Oxygen Re-Evaluation:  Oxygen Re-Evaluation     Row Name 08/13/22 1723 09/12/22 1731 10/11/22 1739 11/19/22 1747       Program Oxygen Prescription   Program Oxygen Prescription -- None None None      Home Oxygen   Home Oxygen Device -- None None None    Sleep Oxygen Prescription -- CPAP CPAP None    Home Exercise Oxygen Prescription -- None None None    Home Resting Oxygen Prescription -- None None None    Compliance with Home Oxygen Use -- Yes Yes --      Goals/Expected Outcomes   Short Term Goals -- To learn and understand importance of monitoring SPO2 with pulse oximeter and demonstrate accurate use of the pulse oximeter.;To learn and understand importance of maintaining oxygen saturations>88%;To learn and demonstrate proper pursed lip breathing techniques or other breathing techniques. ;To learn and demonstrate proper use of respiratory medications To learn and understand importance of maintaining oxygen saturations>88%;To learn and understand importance of monitoring SPO2 with pulse oximeter and demonstrate accurate use of the pulse oximeter. Other    Long  Term Goals -- Verbalizes importance of monitoring  SPO2 with pulse oximeter and return demonstration;Maintenance of O2 saturations>88%;Exhibits proper breathing techniques, such as pursed lip breathing or other method taught during program session;Compliance with respiratory medication;Demonstrates proper use of MDI's Maintenance of O2 saturations>88%;Verbalizes importance of monitoring SPO2 with pulse oximeter and return demonstration Other    Comments Reviewed PLB technique with pt.  Talked about how it works and it's importance in maintaining their exercise saturations. Patient reported that she uses her CPAP as consistent as she can at home. She aslo reports that she feels comfortable using PLB when she has shortness of breath. Patient has a pulse oximeter to check oxygen saturation at home. Informed and explained to patient why it is important to check oxygen at rest and on exertion at home. Informed patient that  they should be 88 percent and above with their oxygen readings and that it is important to have levels above 88 to prevent damage to tissues in the body. Patient verbalizes understanding. [name] states that her latest sleep study states that she does not have sleep apnea. She has an oxygen concentrator but does not use it. She does not have any questions about her respiratory status.    Goals/Expected Outcomes Short: Become more profiecient at using PLB.   Long: Become independent at using PLB. Short: Become more profiecient at using PLB. continue to try to be consistent with CPAP use at night.   Long: Become independent at using PLB. Short: monitor oxygen at rest and with exertion at home. Long: maintain oxygen saturations above 88 percent independently. Short: continue working on breathing techniques. Long: graduate [employer].             Oxygen Discharge (Final Oxygen Re-Evaluation):  Oxygen Re-Evaluation - 11/19/22 1747       Program Oxygen Prescription   Program Oxygen Prescription None      Home Oxygen   Home Oxygen Device None     Sleep Oxygen Prescription None    Home Exercise Oxygen Prescription None    Home Resting Oxygen Prescription None      Goals/Expected Outcomes   Short Term Goals Other    Long  Term Goals Other    Comments [name] states that her latest sleep study states that she does not have sleep apnea. She has an oxygen concentrator but does not use it. She does not have any questions about her respiratory status.    Goals/Expected Outcomes Short: continue working on breathing techniques. Long: graduate [employer].             Initial Exercise Prescription:  Initial Exercise Prescription - 08/06/22 1100       Date of Initial Exercise RX and Referring Provider   Date 08/06/22    Referring Provider Dr. Sarina Ser, MD      Oxygen   Maintain Oxygen Saturation 88% or higher      Treadmill   MPH 2.4    Grade 0    Minutes 15    METs 2.84      Recumbant Bike   Level 2    RPM 50    Watts 26    Minutes 15    METs 2.86      NuStep   Level 2    SPM 80    Minutes 15    METs 2.86      Biostep-RELP   Level 2    SPM 50    Minutes 15    METs 2.86      Prescription Details   Frequency (times per week) 2    Duration Progress to 30 minutes of continuous aerobic without signs/symptoms of physical distress      Intensity   THRR 40-80% of Max Heartrate 102-136    Ratings of Perceived Exertion 11-13    Perceived Dyspnea 0-4      Progression   Progression Continue to progress workloads to maintain intensity without signs/symptoms of physical distress.      Resistance Training   Training Prescription Yes    Weight 4 lb    Reps 10-15             Perform Capillary Blood Glucose checks as needed.  Exercise Prescription Changes:   Exercise Prescription Changes     Row Name 08/06/22 1100 08/15/22 1700 08/27/22 1300  09/10/22 1100 09/24/22 1300     Response to Exercise   Blood Pressure (Admit) 126/74 138/84 136/70 124/70 122/72   Blood Pressure (Exercise) 160/80 166/86  160/82 138/64 144/80   Blood Pressure (Exit) 144/78 134/72 112/74 104/62 112/66   Heart Rate (Admit) 68 bpm 78 bpm 84 bpm 83 bpm 75 bpm   Heart Rate (Exercise) 105 bpm 118 bpm 114 bpm 117 bpm 102 bpm   Heart Rate (Exit) 70 bpm 80 bpm 93 bpm 89 bpm 92 bpm   Oxygen Saturation (Admit) 98 % 96 % 95 % 94 % 95 %   Oxygen Saturation (Exercise) 93 % 93 % 91 % 91 % 91 %   Oxygen Saturation (Exit) 96 % 97 % 93 % 95 % 92 %   Rating of Perceived Exertion (Exercise) 11 11 13 12 13    Perceived Dyspnea (Exercise) 1 -- -- -- --   Symptoms Chronic Back Pain 9/10 none none none none   Comments Results 1st full day of exercise -- -- --   Duration -- Progress to 30 minutes of  aerobic without signs/symptoms of physical distress Continue with 30 min of aerobic exercise without signs/symptoms of physical distress. Continue with 30 min of aerobic exercise without signs/symptoms of physical distress. Continue with 30 min of aerobic exercise without signs/symptoms of physical distress.   Intensity -- THRR unchanged THRR unchanged THRR unchanged THRR unchanged     Progression   Progression -- Continue to progress workloads to maintain intensity without signs/symptoms of physical distress. Continue to progress workloads to maintain intensity without signs/symptoms of physical distress. Continue to progress workloads to maintain intensity without signs/symptoms of physical distress. Continue to progress workloads to maintain intensity without signs/symptoms of physical distress.   Average METs -- 2.62 2.45 2.95 2.89     Resistance Training   Training Prescription -- Yes Yes Yes Yes   Weight -- 4 lb 4 lb 4 lb 4 lb   Reps -- 10-15 10-15 10-15 10-15     Interval Training   Interval Training -- No No No No     Treadmill   MPH -- 1.9 2.1 2.4 2.4   Grade -- 0 0 0 0   Minutes -- 15 15 15 15    METs -- 2.45 2.61 2.84 2.84     Recumbant Bike   Level -- -- 2 3 5    Watts -- -- 22 27 34   Minutes -- -- 15 15 15     METs -- -- 2.65 2.87 3.08     NuStep   Level -- 2 2 3 4    Minutes -- 15 15 15 15    METs -- 2.8 2.3 3.1 3.4     Biostep-RELP   Level -- -- -- -- 1   Minutes -- -- -- -- 15   METs -- -- -- -- 3     Oxygen   Maintain Oxygen Saturation -- 88% or higher 88% or higher 88% or higher 88% or higher    Row Name 10/04/22 1800 10/08/22 1400 10/22/22 1400 11/09/22 0800 11/22/22 0700     Response to Exercise   Blood Pressure (Admit) -- 124/76 138/76 132/76 102/74   Blood Pressure (Exit) -- 132/76 120/72 112/72 118/70   Heart Rate (Admit) -- 72 bpm 84 bpm 75 bpm 75 bpm   Heart Rate (Exercise) -- 86 bpm 107 bpm 102 bpm 98 bpm   Heart Rate (Exit) -- 79 bpm 92 bpm 81 bpm 80 bpm  Oxygen Saturation (Admit) -- 92 % 95 % 96 % 95 %   Oxygen Saturation (Exercise) -- 90 % 91 % 90 % 93 %   Oxygen Saturation (Exit) -- 90 % 95 % 91 % 94 %   Rating of Perceived Exertion (Exercise) -- 11 13 13 12    Perceived Dyspnea (Exercise) -- 0 1 1 0   Symptoms -- none none none none   Duration -- Continue with 30 min of aerobic exercise without signs/symptoms of physical distress. Continue with 30 min of aerobic exercise without signs/symptoms of physical distress. Continue with 30 min of aerobic exercise without signs/symptoms of physical distress. Continue with 30 min of aerobic exercise without signs/symptoms of physical distress.   Intensity -- THRR unchanged THRR unchanged THRR unchanged THRR unchanged     Progression   Progression -- Continue to progress workloads to maintain intensity without signs/symptoms of physical distress. Continue to progress workloads to maintain intensity without signs/symptoms of physical distress. Continue to progress workloads to maintain intensity without signs/symptoms of physical distress. Continue to progress workloads to maintain intensity without signs/symptoms of physical distress.   Average METs -- 2.8 2.86 2.98 3.32     Resistance Training   Training Prescription -- Yes Yes  Yes Yes   Weight -- 4 lb 4 lb 4 lb 4 lb   Reps -- 10-15 10-15 10-15 10-15     Interval Training   Interval Training -- No No No No     Treadmill   MPH -- -- 2.4 2.6 2.7   Grade -- -- 0 0 0   Minutes -- -- 15 15 15    METs -- -- 2.84 2.99 3.07     Recumbant Bike   Level -- -- 5 6 5    Watts -- -- 41 40 41   Minutes -- -- 15 15 15    METs -- -- 3.31 -- 3.32     NuStep   Level -- 4 4 5 4    Minutes -- 15 15 15 15    METs -- 2.6 3.4 3.1 2.9     REL-XR   Level -- -- 3 -- --   Minutes -- -- 15 -- --   METs -- -- 3.3 -- --     Biostep-RELP   Level -- 3 1 4 4    Minutes -- 15 15 15 15    METs -- -- 2 3 4      Home Exercise Plan   Plans to continue exercise at Home (comment)  Walking twice a week Home (comment)  Walking twice a week Home (comment)  Walking twice a week Home (comment)  Walking twice a week Home (comment)  Walking twice a week   Frequency Add 3 additional days to program exercise sessions. Add 3 additional days to program exercise sessions. Add 3 additional days to program exercise sessions. Add 3 additional days to program exercise sessions. Add 3 additional days to program exercise sessions.   Initial Home Exercises Provided 10/04/22 10/04/22 10/04/22 10/04/22 10/04/22     Oxygen   Maintain Oxygen Saturation 88% or higher 88% or higher 88% or higher 88% or higher 88% or higher    Row Name 12/06/22 0700             Response to Exercise   Blood Pressure (Admit) 122/80       Blood Pressure (Exit) 128/80       Heart Rate (Admit) 76 bpm       Heart Rate (Exercise) 105  bpm       Heart Rate (Exit) 84 bpm       Oxygen Saturation (Admit) 96 %       Oxygen Saturation (Exercise) 91 %       Oxygen Saturation (Exit) 94 %       Rating of Perceived Exertion (Exercise) 12       Perceived Dyspnea (Exercise) 1       Symptoms none       Duration Continue with 30 min of aerobic exercise without signs/symptoms of physical distress.       Intensity THRR unchanged          Progression   Progression Continue to progress workloads to maintain intensity without signs/symptoms of physical distress.       Average METs 3.34         Resistance Training   Training Prescription Yes       Weight 4 lb       Reps 10-15         Interval Training   Interval Training No         Treadmill   MPH 2.7       Grade 0       Minutes 15       METs 3.07         Recumbant Bike   Level 5       Watts 41       Minutes 15       METs 3.34         NuStep   Level 4       Minutes 15       METs 3.1         Biostep-RELP   Level 4       Minutes 15       METs 4         Home Exercise Plan   Plans to continue exercise at Home (comment)  Walking twice a week       Frequency Add 3 additional days to program exercise sessions.       Initial Home Exercises Provided 10/04/22         Oxygen   Maintain Oxygen Saturation 88% or higher                Exercise Comments:   Exercise Comments     Row Name 08/13/22 1721           Exercise Comments First full day of exercise!  Patient was oriented to gym and equipment including functions, settings, policies, and procedures.  Patient's individual exercise prescription and treatment plan were reviewed.  All starting workloads were established based on the results of the 6 minute walk test done at initial orientation visit.  The plan for exercise progression was also introduced and progression will be customized based on patient's performance and goals.                Exercise Goals and Review:   Exercise Goals     Row Name 08/06/22 1050             Exercise Goals   Increase Physical Activity Yes       Intervention Provide advice, education, support and counseling about physical activity/exercise needs.;Develop an individualized exercise prescription for aerobic and resistive training based on initial evaluation findings, risk stratification, comorbidities and participant's personal goals.       Expected Outcomes  Short Term: Attend rehab on a  regular basis to increase amount of physical activity.;Long Term: Add in home exercise to make exercise part of routine and to increase amount of physical activity.;Long Term: Exercising regularly at least 3-5 days a week.       Increase Strength and Stamina Yes       Intervention Provide advice, education, support and counseling about physical activity/exercise needs.;Develop an individualized exercise prescription for aerobic and resistive training based on initial evaluation findings, risk stratification, comorbidities and participant's personal goals.       Expected Outcomes Short Term: Increase workloads from initial exercise prescription for resistance, speed, and METs.;Short Term: Perform resistance training exercises routinely during rehab and add in resistance training at home;Long Term: Improve cardiorespiratory fitness, muscular endurance and strength as measured by increased METs and functional capacity ( )       Able to understand and use rate of perceived exertion (RPE) scale Yes       Intervention Provide education and explanation on how to use RPE scale       Expected Outcomes Short Term: Able to use RPE daily in rehab to express subjective intensity level;Long Term:  Able to use RPE to guide intensity level when exercising independently       Able to understand and use Dyspnea scale Yes       Intervention Provide education and explanation on how to use Dyspnea scale       Expected Outcomes Short Term: Able to use Dyspnea scale daily in rehab to express subjective sense of shortness of breath during exertion;Long Term: Able to use Dyspnea scale to guide intensity level when exercising independently       Knowledge and understanding of Target Heart Rate Range (THRR) Yes       Intervention Provide education and explanation of THRR including how the numbers were predicted and where they are located for reference       Expected Outcomes Short Term: Able to  state/look up THRR;Long Term: Able to use THRR to govern intensity when exercising independently;Short Term: Able to use daily as guideline for intensity in rehab       Able to check pulse independently Yes       Intervention Provide education and demonstration on how to check pulse in carotid and radial arteries.;Review the importance of being able to check your own pulse for safety during independent exercise       Expected Outcomes Short Term: Able to explain why pulse checking is important during independent exercise;Long Term: Able to check pulse independently and accurately       Understanding of Exercise Prescription Yes       Intervention Provide education, explanation, and written materials on patient's individual exercise prescription       Expected Outcomes Short Term: Able to explain program exercise prescription;Long Term: Able to explain home exercise prescription to exercise independently                Exercise Goals Re-Evaluation :  Exercise Goals Re-Evaluation     Row Name 08/13/22 1722 08/15/22 1801 08/27/22 1315 09/10/22 1158 09/12/22 1755     Exercise Goal Re-Evaluation   Exercise Goals Review Increase Physical Activity;Able to understand and use rate of perceived exertion (RPE) scale;Knowledge and understanding of Target Heart Rate Range (THRR);Understanding of Exercise Prescription;Increase Strength and Stamina;Able to understand and use Dyspnea scale;Able to check pulse independently Increase Physical Activity;Increase Strength and Stamina;Understanding of Exercise Prescription Increase Physical Activity;Increase Strength and Stamina;Understanding of Exercise Prescription Increase Physical Activity;Increase  Strength and Stamina;Understanding of Exercise Prescription Increase Physical Activity;Increase Strength and Stamina;Understanding of Exercise Prescription   Comments Reviewed RPE scale, THR and program prescription with pt today.  Pt voiced understanding and was given  a copy of goals to take home. [name] did well for her first session of rehab.  She was able to do level 2 on the Nustep and 1.9 mph on the treadmill. Her RPEs are appropriate  with exercise. We will continue to monitor as she continues to progress in the program. [name] continues to do well in the program. She recently increased her treadmill speed to 2.1 mph with no incline. She also began using the recumbent bike and did well with level 2. She has continued to use 4 lb hand weights for resistance training as well. We will continue to monitor her progress in the program. [name] continues to do well in rehab. She increased her treadmill speed to 2.4 mph. She could benefit from adding an incline to her prescription. She also increased to level 3 on the recumbent bike and T4 Nustep. All with appropriate RPEs.She continues to reach her THR most sessions. Will continue to monitor. Patient reports that she is procressing with her exercise in class and continue to increase her work loads when tolerated. She has not been exercising at home but packing boxes. She has some frustration that she can not do things at home such as lifiting and walking like she used to be able to do. Time prohibited going over home exercise guidelines today but this will be done in the near future with program staff.   Expected Outcomes Short: Use RPE daily to regulate intensity.  Long: Follow program prescription in THR. Short: Continue to exercise at initial exercise prescription Long: Increase overall MET level and stamina Short: Try to add incline to treadmill workload. Long: Continue to improve strength and stamina. Short:Add small incline to treadmill Long: Continue to increase overall MET level and stamina Short: go over home exercise guidelines with staff memeber. Long: become independent with exercise program.    Row Name 09/24/22 1354 10/04/22 1818 10/08/22 1448 10/22/22 1408 11/09/22 0828     Exercise Goal Re-Evaluation   Exercise Goals  Review Increase Physical Activity;Increase Strength and Stamina;Understanding of Exercise Prescription Increase Physical Activity;Increase Strength and Stamina;Able to understand and use rate of perceived exertion (RPE) scale;Able to understand and use Dyspnea scale;Able to check pulse independently;Knowledge and understanding of Target Heart Rate Range (THRR);Understanding of Exercise Prescription Increase Physical Activity;Increase Strength and Stamina;Understanding of Exercise Prescription Increase Physical Activity;Increase Strength and Stamina;Understanding of Exercise Prescription Increase Physical Activity;Increase Strength and Stamina;Understanding of Exercise Prescription   Comments [name] continues to do well in rehab. She continues to walk on the treadmill at a speed of 2.4 mph with an incline of 0%. She also improved to level 5 on the recumbent bike and level 4 on the T4 nustep. She has conitnued to use 3 lb hand weights for resistance training as well. We will continue to monitor her progress in the program. Reviewed home exercise with pt today.  Pt plans to walk at home for exercise.  Reviewed THR, pulse, RPE, sign and symptoms, pulse oximetery and when to call 911 or MD.  Also discussed weather considerations and indoor options.  Pt voiced understanding. [name] is doing well in rehab.  She [place] up to level 3 on the BioStep.  Her RPE is staying around 11 so we will encourage her to push her spm or increase her  workload to aim for 12-13 on RPE. [name] is doing well in rehab. She continues to work at level 4 on the T4 nustep and level 5 on the recumbent bike. She also began using the XR at level and did well. She continues to do well walking on the treadmill at a speed of 2.4 mph with no incline as well. We will continue to monitor her progress in the program. [name] is doing well in rehab. She improved her workloads on the biostep, T4 nustep, and recumbent bike. She also increased her treadmill speed to 2.6 mph with no  incline. She continues to do well with 4 lb hand weights for resistance training as well. We will continue to monitor her progress in the program.   Expected Outcomes Short: Continue to progressively increase treadmill workload. Long: Continue to improve strength and stamina. Short: Begin walking at home twice a week in addition to rehab. Long: Continue to exercise independently. Short: Increase RPM or level on equipment for RPE 12-13 Long: Continue to improve stamina Short: Add incline to treadmill workload. Long: Continue to improve strength and stamina. Short: Try 5 lb hand weights for resistance training. Long: Continue to improve strength and stamina.    Row Name 11/22/22 0747 12/06/22 0747           Exercise Goal Re-Evaluation   Exercise Goals Review Increase Physical Activity;Increase Strength and Stamina;Understanding of Exercise Prescription Increase Physical Activity;Increase Strength and Stamina;Understanding of Exercise Prescription      Comments [name] continues to do well in rehab. She recently increased her treadmill speed to 2.7 mph with no incline. She also has continued to work at level 4 on both the T4 nustep and biostep, and level 5 on the recumbent bike. She has done well with 4 lb hand weights for resistance training as well. We will continue to monitor her progress in the program. [name] continues to do well in rehab. She has stayed consistent with her treadmill speed at 2.7 mph with no incline. She also has continued to work at level 4 on both the T4 nustep and biostep, and level 5 on the recumbent bike. She continues to do well with 4 lb hand weights for resistance training as well. We will continue to monitor her progress in the program.      Expected Outcomes Short: Try 5 lb hand weights for resistance training. Long: Continue exercise to improve strength and stamina. Short: Add incline to treadmill workload. Long: Continue exercise to improve strength and stamina.                Discharge Exercise Prescription (Final Exercise Prescription Changes):  Exercise Prescription Changes - 12/06/22 0700       Response to Exercise   Blood Pressure (Admit) 122/80    Blood Pressure (Exit) 128/80    Heart Rate (Admit) 76 bpm    Heart Rate (Exercise) 105 bpm    Heart Rate (Exit) 84 bpm    Oxygen Saturation (Admit) 96 %    Oxygen Saturation (Exercise) 91 %    Oxygen Saturation (Exit) 94 %    Rating of Perceived Exertion (Exercise) 12    Perceived Dyspnea (Exercise) 1    Symptoms none    Duration Continue with 30 min of aerobic exercise without signs/symptoms of physical distress.    Intensity THRR unchanged      Progression   Progression Continue to progress workloads to maintain intensity without signs/symptoms of physical distress.    Average METs 3.34  Resistance Training   Training Prescription Yes    Weight 4 lb    Reps 10-15      Interval Training   Interval Training No      Treadmill   MPH 2.7    Grade 0    Minutes 15    METs 3.07      Recumbant Bike   Level 5    Watts 41    Minutes 15    METs 3.34      NuStep   Level 4    Minutes 15    METs 3.1      Biostep-RELP   Level 4    Minutes 15    METs 4      Home Exercise Plan   Plans to continue exercise at Home (comment)   Walking twice a week   Frequency Add 3 additional days to program exercise sessions.    Initial Home Exercises Provided 10/04/22      Oxygen   Maintain Oxygen Saturation 88% or higher             Nutrition:  Target Goals: Understanding of nutrition guidelines, daily intake of sodium 1500mg , cholesterol 200mg , calories 30% from fat and 7% or less from saturated fats, daily to have 5 or more servings of fruits and vegetables.  Education: All About Nutrition: -Group instruction provided by verbal, written material, interactive activities, discussions, models, and posters to present general guidelines for heart healthy nutrition including fat, fiber,  MyPlate, the role of sodium in heart healthy nutrition, utilization of the nutrition label, and utilization of this knowledge for meal planning. Follow up email sent as well. Written material given at graduation.   Biometrics:  Pre Biometrics - 08/06/22 1051       Pre Biometrics   Height 5\' 5"  (1.651 m)    Weight 215 lb 9.6 oz (97.8 kg)    Waist Circumference 41.5 inches    Hip Circumference 43 inches    Waist to Hip Ratio 0.97 %    BMI (Calculated) 35.88    Single Leg Stand 30 seconds             Post Biometrics - 12/06/22 1734        Post  Biometrics   Height 5\' 5"  (1.651 m)    Weight 214 lb (97.1 kg)    BMI (Calculated) 35.61    Single Leg Stand 19.66 seconds             Nutrition Therapy Plan and Nutrition Goals:  Nutrition Therapy & Goals - 08/20/22 1722       Nutrition Therapy   Diet Heart healthy, low Na    Drug/Food Interactions Statins/Certain Fruits    Protein (specify units) 60-70g    Fiber 25 grams    Whole Grain Foods 3 servings    Saturated Fats 12 max. grams    Fruits and Vegetables 8 servings/day    Sodium 2 grams      Personal Nutrition Goals   Nutrition Goal ST: include 6-8 smaller meals per day including heart healthy fats, fiber, and limiting salt LT: follow MyPlate guidelines    Comments 67 y.o. F admitted to pulmonary rehab for COPD. PMHx includes GERD, hypothyroidism, CKD stg 3, HLD, tobacco use, anxiety/depression, PTSD, vit B12 deficiency, OSA. PSHx includes cholecystectomy, thyroidectomy, parathyroidectomy. Documented homelessness 09/22/21, now housed. Labs reviewed. Medications reviewed atorvastatin, furosemide, synthroid, pantoprazole, celexa. [name] reports being unaware of any CKD stg 3 diagnosis -  discussed how  that is in her past medical history and encouraged her to speak with her MD regarding this. [name] reports she would like to get back to 6-8 meals per day and cooking more heart healthy meals as she used to eat this way and enjoyed it  - she lives with someone and is not able to eat this way; she hopes to find a new living situation within 2 weeks. She reports not eating much 15-30 oz of water raisin toast or multigrain peaches (canned in it's own juice) and pineapples. L: Small plate of leftovers (usually beans) D: beans with frozen steamable broccoli. Drinks: water with coffee (2-3 10-12oz cups). Discussed heart healthy eating and CKD stg 3 MNT.      Intervention Plan   Intervention Prescribe, educate and counsel regarding individualized specific dietary modifications aiming towards targeted core components such as weight, hypertension, lipid management, diabetes, heart failure and other comorbidities.    Expected Outcomes Short Term Goal: A plan has been developed with personal nutrition goals set during dietitian appointment.;Short Term Goal: Understand basic principles of dietary content, such as calories, fat, sodium, cholesterol and nutrients.;Long Term Goal: Adherence to prescribed nutrition plan.             Nutrition Assessments:  MEDIFICTS Score Key: ?70 Need to make dietary changes  40-70 Heart Healthy Diet ? 40 Therapeutic Level Cholesterol Diet  Flowsheet Row Pulmonary Rehab from 08/06/2022 in [facility]  Picture Your Plate Total Score on Admission 72      Picture Your Plate Scores: <38 Unhealthy dietary pattern with much room for improvement. 41-50 Dietary pattern unlikely to meet recommendations for good health and room for improvement. 51-60 More healthful dietary pattern, with some room for improvement.  >60 Healthy dietary pattern, although there may be some specific behaviors that could be improved.   Nutrition Goals Re-Evaluation:  Nutrition Goals Re-Evaluation     Row Name 09/12/22 1735 10/11/22 1745 11/19/22 1755         Goals   Current Weight -- 215 lb (97.5 kg) 211 lb (95.7 kg)     Nutrition Goal ST: include 6-8 smaller meals per day including heart healthy fats,  fiber, and limiting salt LT: follow MyPlate guidelines Eat smaller Eat smaller more frequent meals     Comment Patient reports that she is trying to follow dietary recommendations but has some instability in her living situation and ability to always cook on a routine schedule. She does report that she does not eat katchup becasue that causes her to retain fluid. Patient was informed on why it is important to maintain a balanced diet when dealing with Respiratory issues. Explained that it takes a lot of energy to breath and when they are short of breath often they will need to have a good diet to help keep up with the calories they are expending for breathing. Meet with RD.     Expected Outcome Short: continue to work on securing living situaiton so that she has more control over her routine and ability to have access to a kitchen to cook healthy meals. Long: eat a heart healthy diet and eat 6-8 small meals a day once she has more stability in living situaiton. Short: Choose and plan snacks accordingly to patients caloric intake to improve breathing. Long: Maintain a diet independently that meets their caloric intake to aid in daily shortness of breath. Short: meet with RD. Long: adhere to a diet that pertains to her.  Nutrition Goals Discharge (Final Nutrition Goals Re-Evaluation):  Nutrition Goals Re-Evaluation - 11/19/22 1755       Goals   Current Weight 211 lb (95.7 kg)    Nutrition Goal Eat smaller more frequent meals    Comment Meet with RD.    Expected Outcome Short: meet with RD. Long: adhere to a diet that pertains to her.             Psychosocial: Target Goals: Acknowledge presence or absence of significant depression and/or stress, maximize coping skills, provide positive support system. Participant is able to verbalize types and ability to use techniques and skills needed for reducing stress and depression.   Education: Stress, Anxiety, and Depression - Group  verbal and visual presentation to define topics covered.  Reviews how body is impacted by stress, anxiety, and depression.  Also discusses healthy ways to reduce stress and to treat/manage anxiety and depression.  Written material given at graduation.   Education: Sleep Hygiene -Provides group verbal and written instruction about how sleep can affect your health.  Define sleep hygiene, discuss sleep cycles and impact of sleep habits. Review good sleep hygiene tips.    Initial Review & Psychosocial Screening:  Initial Psych Review & Screening - 07/26/22 1342       Initial Review   Current issues with Current Stress Concerns    Source of Stress Concerns Financial;Chronic Illness      Family Dynamics   Good Support System? Yes   daughters     Barriers   Psychosocial barriers to participate in program There are no identifiable barriers or psychosocial needs.;The patient should benefit from training in stress management and relaxation.      Screening Interventions   Interventions Encouraged to exercise;Provide feedback about the scores to participant    Expected Outcomes Short Term goal: Utilizing psychosocial counselor, staff and physician to assist with identification of specific Stressors or current issues interfering with healing process. Setting desired goal for each stressor or current issue identified.;Long Term Goal: Stressors or current issues are controlled or eliminated.;Short Term goal: Identification and review with participant of any Quality of Life or Depression concerns found by scoring the questionnaire.;Long Term goal: The participant improves quality of Life and PHQ9 Scores as seen by post scores and/or verbalization of changes             Quality of Life Scores:  Scores of 19 and below usually indicate a poorer quality of life in these areas.  A difference of  2-3 points is a clinically meaningful difference.  A difference of 2-3 points in the total score of the  Quality of Life Index has been associated with significant improvement in overall quality of life, self-image, physical symptoms, and general health in studies assessing change in quality of life.  PHQ-9: Review Flowsheet  More data exists      09/24/2022 08/06/2022 03/26/2022 02/01/2022 10/18/2021  Depression screen PHQ 2/9  Decreased Interest 1 0 0 0 0  Down, Depressed, Hopeless 1 0 0 0 0  PHQ - 2 Score 2 0 0 0 0  Altered sleeping 2 1 1 3  -  Tired, decreased energy 2 1 1  0 -  Change in appetite 1 0 0 0 -  Feeling bad or failure about yourself  0 0 0 0 -  Trouble concentrating 0 0 0 0 -  Moving slowly or fidgety/restless 0 0 0 0 -  Suicidal thoughts 0 0 0 0 -  PHQ-9 Score 7 2  2 3 -  Difficult doing work/chores - Not difficult at all - Not difficult at all -    Details           Interpretation of Total Score  Total Score Depression Severity:  1-4 = Minimal depression, 5-9 = Mild depression, 10-14 = Moderate depression, 15-19 = Moderately severe depression, 20-27 = Severe depression   Psychosocial Evaluation and Intervention:  Psychosocial Evaluation - 07/26/22 1401       Psychosocial Evaluation & Interventions   Interventions Encouraged to exercise with the program and follow exercise prescription;Stress management education;Relaxation education    Comments Ms. Fornes is coming to Pulmonary Rehab with dypsnea and mild COPD. She has noticed that her breathing and stamina have gotten a lot worse in the last year, where it is hard for her to walk even short distances where before she could walk with no problem. She had her thyroid surgery years ago and states she has gained a lot of weight after that she can't seem to shake. She is working really hard on quitting smoking entirely. She tried the patches and had to stop because of their side effects. She has started changing her lifestyle habits, like drinking more water or going outside on her porch instead of smoking. She is down to very  sporadically smoking. She mentioned that some triggers are her car (since is smells like smoke), when she is on the phone a lot during her work calls, and when her what used to be very big temper flares up. As far as her temper, she notes that she has made a lot of positive changes over the years and she doesn't let as many things bother her as she used to. She really likes her job at [employer] where she works from home most days. However, she notes that she sits a a lot more than she used to. She is looking forward to becoming more active, but hesitant on what she can actually do given her worsening health concerns.    Expected Outcomes Short: attend pulmonary rehab for education and exercise. Long: develop and maintain positive self care habits.    Continue Psychosocial Services  Follow up required by staff             Psychosocial Re-Evaluation:  Psychosocial Re-Evaluation     Row Name 09/12/22 1750 10/11/22 1747 11/19/22 1752         Psychosocial Re-Evaluation   Current issues with Current Stress Concerns Current Stress Concerns Current Stress Concerns     Comments no new mental health concerns but does continue to have stress related to current living situation and her daughter's health concerns. [name] is living with a friend she has known for 40 years. Her main stressor is her room mate that is an alcoholic. She likes her job and what she does but states that there is some drama that is bothering her at work. Patient reports no issues with their current mental states, sleep, stress, depression or anxiety. Will follow up with patient in a few weeks for any changes.     Expected Outcomes Short: continue to work to a more stable living situation. Long: Continue to maintain good mental health habits. Short: Attend [facility] stress management education to decrease stress. Long: Maintain exercise Post [facility] to keep stress at a minimum. Short: Continue to exercise regularly to support mental  health and notify staff of any changes. Long: maintain mental health and well being through teaching of rehab or prescribed  medications independently.     Interventions Encouraged to attend Pulmonary Rehabilitation for the exercise Encouraged to attend Pulmonary Rehabilitation for the exercise Encouraged to attend Pulmonary Rehabilitation for the exercise     Continue Psychosocial Services  Follow up required by staff Follow up required by staff Follow up required by staff              Psychosocial Discharge (Final Psychosocial Re-Evaluation):  Psychosocial Re-Evaluation - 11/19/22 1752       Psychosocial Re-Evaluation   Current issues with Current Stress Concerns    Comments Patient reports no issues with their current mental states, sleep, stress, depression or anxiety. Will follow up with patient in a few weeks for any changes.    Expected Outcomes Short: Continue to exercise regularly to support mental health and notify staff of any changes. Long: maintain mental health and well being through teaching of rehab or prescribed medications independently.    Interventions Encouraged to attend Pulmonary Rehabilitation for the exercise    Continue Psychosocial Services  Follow up required by staff             Education: Education Goals: Education classes will be provided on a weekly basis, covering required topics. Participant will state understanding/return demonstration of topics presented.  Learning Barriers/Preferences:  Learning Barriers/Preferences - 07/26/22 1342       Learning Barriers/Preferences   Learning Barriers None    Learning Preferences None             General Pulmonary Education Topics:  Infection Prevention: - Provides verbal and written material to individual with discussion of infection control including proper hand washing and proper equipment cleaning during exercise session. Flowsheet Row Pulmonary Rehab from 09/12/2022 in [place] [facility] Cardiac and Pulmonary  Rehab  Date 08/06/22  Educator NT  Instruction Review Code 1- Verbalizes Understanding       Falls Prevention: - Provides verbal and written material to individual with discussion of falls prevention and safety. Flowsheet Row Pulmonary Rehab from 09/12/2022 in [facility] Cardiac and Pulmonary Rehab  Date 08/06/22  Educator NT  Instruction Review Code 1- Verbalizes Understanding       Chronic Lung Disease Review: - Group verbal instruction with posters, models, PowerPoint presentations and videos,  to review new updates, new respiratory medications, new advancements in procedures and treatments. Providing information on websites and "800" numbers for continued self-education. Includes information about supplement oxygen, available portable oxygen systems, continuous and intermittent flow rates, oxygen safety, concentrators, and Medicare reimbursement for oxygen. Explanation of Pulmonary Drugs, including class, frequency, complications, importance of spacers, rinsing mouth after steroid MDI's, and proper cleaning methods for nebulizers. Review of basic lung anatomy and physiology related to function, structure, and complications of lung disease. Review of risk factors. Discussion about methods for diagnosing sleep apnea and types of masks and machines for OSA. Includes a review of the use of types of environmental controls: home humidity, furnaces, filters, dust mite/pet prevention, HEPA vacuums. Discussion about weather changes, air quality and the benefits of nasal washing. Instruction on Warning signs, infection symptoms, calling MD promptly, preventive modes, and value of vaccinations. Review of effective airway clearance, coughing and/or vibration techniques. Emphasizing that all should Create an Action Plan. Written material given at graduation.   AED/CPR: - Group verbal and written instruction with the use of models to demonstrate the basic use of the AED with the basic ABC's of  resuscitation.    Anatomy and Cardiac Procedures: - Group verbal and visual presentation and models  provide information about basic cardiac anatomy and function. Reviews the testing methods done to diagnose heart disease and the outcomes of the test results. Describes the treatment choices: Medical Management, Angioplasty, or Coronary Bypass Surgery for treating various heart conditions including Myocardial Infarction, Angina, Valve Disease, and Cardiac Arrhythmias.  Written material given at graduation.   Medication Safety: - Group verbal and visual instruction to review commonly prescribed medications for heart and lung disease. Reviews the medication, class of the drug, and side effects. Includes the steps to properly store meds and maintain the prescription regimen.  Written material given at graduation.   Other: -Provides group and verbal instruction on various topics (see comments)   Knowledge Questionnaire Score:  Knowledge Questionnaire Score - 08/06/22 1031       Knowledge Questionnaire Score   Pre Score 14/18              Core Components/Risk Factors/Patient Goals at Admission:  Personal Goals and Risk Factors at Admission - 08/06/22 1035       Core Components/Risk Factors/Patient Goals on Admission    Weight Management Yes;Weight Gain    Intervention Weight Management: Develop a combined nutrition and exercise program designed to reach desired caloric intake, while maintaining appropriate intake of nutrient and fiber, sodium and fats, and appropriate energy expenditure required for the weight goal.;Weight Management: Provide education and appropriate resources to help participant work on and attain dietary goals.;Weight Management/Obesity: Establish reasonable short term and long term weight goals.    Admit Weight 215 lb 9.6 oz (97.8 kg)    Goal Weight: Short Term 205 lb (93 kg)    Goal Weight: Long Term 175 lb (79.4 kg)    Expected Outcomes Short Term: Continue to  assess and modify interventions until short term weight is achieved;Long Term: Adherence to nutrition and physical activity/exercise program aimed toward attainment of established weight goal;Weight Loss: Understanding of general recommendations for a balanced deficit meal plan, which promotes 1-2 lb weight loss per week and includes a negative energy balance of [phone] kcal/d;Understanding recommendations for meals to include 15-35% energy as protein, 25-35% energy from fat, 35-60% energy from carbohydrates, less than 200mg  of dietary cholesterol, 20-35 gm of total fiber daily;Understanding of distribution of calorie intake throughout the day with the consumption of 4-5 meals/snacks    Tobacco Cessation Yes    Number of packs per day --   inconsistent   Intervention Assist the participant in steps to quit. Provide individualized education and counseling about committing to Tobacco Cessation, relapse prevention, and pharmacological support that can be provided by physician.;Education officer, environmental, assist with locating and accessing local/national Quit Smoking programs, and support quit date choice.    Expected Outcomes Short Term: Will demonstrate readiness to quit, by selecting a quit date.;Short Term: Will quit all tobacco product use, adhering to prevention of relapse plan.;Long Term: Complete abstinence from all tobacco products for at least 12 months from quit date.    Improve shortness of breath with ADL's Yes    Intervention Provide education, individualized exercise plan and daily activity instruction to help decrease symptoms of SOB with activities of daily living.    Expected Outcomes Short Term: Improve cardiorespiratory fitness to achieve a reduction of symptoms when performing ADLs;Long Term: Be able to perform more ADLs without symptoms or delay the onset of symptoms    Hypertension Yes    Intervention Provide education on lifestyle modifcations including regular physical  activity/exercise, weight management, moderate sodium restriction and increased consumption of  fresh fruit, vegetables, and low fat dairy, alcohol moderation, and smoking cessation.;Monitor prescription use compliance.    Expected Outcomes Short Term: Continued assessment and intervention until BP is < 140/27mm HG in hypertensive participants. < 130/22mm HG in hypertensive participants with diabetes, heart failure or chronic kidney disease.;Long Term: Maintenance of blood pressure at goal levels.    Lipids Yes    Intervention Provide education and support for participant on nutrition & aerobic/resistive exercise along with prescribed medications to achieve LDL 70mg , HDL >40mg .    Expected Outcomes Short Term: Participant states understanding of desired cholesterol values and is compliant with medications prescribed. Participant is following exercise prescription and nutrition guidelines.;Long Term: Cholesterol controlled with medications as prescribed, with individualized exercise RX and with personalized nutrition plan. Value goals: LDL < 70mg , HDL > 40 mg.             Education:Diabetes - Individual verbal and written instruction to review signs/symptoms of diabetes, desired ranges of glucose level fasting, after meals and with exercise. Acknowledge that pre and post exercise glucose checks will be done for 3 sessions at entry of program.   Know Your Numbers and Heart Failure: - Group verbal and visual instruction to discuss disease risk factors for cardiac and pulmonary disease and treatment options.  Reviews associated critical values for Overweight/Obesity, Hypertension, Cholesterol, and Diabetes.  Discusses basics of heart failure: signs/symptoms and treatments.  Introduces Heart Failure Zone chart for action plan for heart failure.  Written material given at graduation. Flowsheet Row Pulmonary Rehab from 09/12/2022 in [facility] Cardiac and Pulmonary Rehab  Education need identified 08/06/22   Date 09/12/22  Educator MS  Instruction Review Code 1- Verbalizes Understanding       Core Components/Risk Factors/Patient Goals Review:   Goals and Risk Factor Review     Row Name 09/12/22 1745 10/11/22 1741 11/19/22 1757         Core Components/Risk Factors/Patient Goals Review   Personal Goals Review Weight Management/Obesity;Tobacco Cessation Improve shortness of breath with ADL's Weight Management/Obesity     Review Patient reports that her weight has gone up. She is continually dealing with fluid retention. She does not have a scale at home and she was encouraged to buy one to monitor weight. She continues to smoke 2-3 cigarettes a day and does not currently have a quit date. she does take all medications as prescribed. Spoke to patient about their shortness of breath and what they can do to improve. Patient has been informed of breathing techniques when starting the program. Patient is informed to tell staff if they have had any med changes and that certain meds they are taking or not taking can be causing shortness of breath. Informed patient that they can take rest breaks when they get short of breath, regain breath and resume exercise. [name] wants to try to lose weight and reach a weight goal of 150 pounds. She is doing well in rehab and has lost 4 pounds. Her blood pressure has been good and she has no issues with managing it.     Expected Outcomes Short: buy and scale and begin to monitor weight at home on a daily basis. Long: work on quitting smoking. Short: Attend [facility] regularly to improve shortness of breath with ADL's. Long: maintain independence with ADL's Short: lose 5 pounds in the next few weeks. Long: reach weight goal.              Core Components/Risk Factors/Patient Goals at Discharge (Final Review):  Goals and Risk Factor Review - 11/19/22 1757       Core Components/Risk Factors/Patient Goals Review   Personal Goals Review Weight Management/Obesity     Review [name] wants to try to lose weight and reach a weight goal of 150 pounds. She is doing well in rehab and has lost 4 pounds. Her blood pressure has been good and she has no issues with managing it.    Expected Outcomes Short: lose 5 pounds in the next few weeks. Long: reach weight goal.             ITP Comments:  ITP Comments     Row Name 07/26/22 1350 08/06/22 1028 08/13/22 1721 08/22/22 1402 09/19/22 0939   ITP Comments Initial phone call completed. Diagnosis can be found in [facility] 12/1. EP Orientation scheduled for Monday 4/1 at 9am.       Nahara is a current tobacco user. Intervention for tobacco cessation was provided at the initial medical review. She was asked about readiness to quit and reported she is ready to quit . Patient was advised and educated about tobacco cessation using combination therapy, tobacco cessation classes, quit line, and quit smoking apps. Patient demonstrated understanding of this material. Staff will continue to provide encouragement and follow up with the patient throughout the program. Completed and gym orientation. Initial ITP created and sent for review to Dr. Vida Rigger, Medical Director. First full day of exercise!  Patient was oriented to gym and equipment including functions, settings, policies, and procedures.  Patient's individual exercise prescription and treatment plan were reviewed.  All starting workloads were established based on the results of the 6 minute walk test done at initial orientation visit.  The plan for exercise progression was also introduced and progression will be customized based on patient's performance and goals. 30 day review completed. ITP sent to Dr. Jinny Sanders, Medical Director of  Pulmonary Rehab. Continue with ITP unless changes are made by physician. Pt still new to program. 30 Day review completed. Medical Director ITP review done, changes made as directed, and signed approval by Medical Director.    Row Name  10/17/22 1130 11/13/22 1503 12/12/22 1203       ITP Comments 30 Day review completed. Medical Director ITP review done, changes made as directed, and signed approval by Medical Director. 30 Day review completed. Medical Director ITP review done, changes made as directed, and signed approval by Medical Director. 30 Day review completed. Medical Director ITP review done, changes made as directed, and signed approval by Medical Director.              Comments:

## 2022-12-17 ENCOUNTER — Encounter: Payer: BC Managed Care – PPO | Admitting: *Deleted

## 2022-12-17 DIAGNOSIS — R0602 Shortness of breath: Secondary | ICD-10-CM

## 2022-12-17 DIAGNOSIS — J449 Chronic obstructive pulmonary disease, unspecified: Secondary | ICD-10-CM

## 2022-12-17 NOTE — Progress Notes (Signed)
Daily Session Note  Patient Details  Name: [name] MRN: [id number] Date of Birth: 03-07-1956 Referring Provider:   Flowsheet Row Pulmonary Rehab from 08/06/2022 in [facility] Cardiac and Pulmonary Rehab  Referring Provider Dr. Sarina Ser, MD       Encounter Date: 12/17/2022  Check In:  Session Check In - 12/17/22 1723       Check-In   Supervising physician immediately available to respond to emergencies See telemetry face sheet for immediately available ER MD    Location [name]    Staff Present [name], RN BSN;Joseph [place], RCP,RRT,BSRT;Kelly Olyphant, [place], ACSM CEP, Exercise Physiologist    Virtual Visit No    Medication changes reported     No    Fall or balance concerns reported    No    Tobacco Cessation No Change    Current number of cigarettes/nicotine per day     2    Warm-up and Cool-down Performed on first and last piece of equipment    Resistance Training Performed Yes    VAD Patient? No    PAD/SET Patient? No      Pain Assessment   Currently in Pain? No/denies                Social History   Tobacco Use  Smoking Status Former   Current packs/day: 1.50   Average packs/day: 1.5 packs/day for 39.0 years (58.5 ttl pk-yrs)   Types: Cigarettes  Smokeless Tobacco Never  Tobacco Comments   Quit smoking cigarettes 10/20/2022 khj    Goals Met:  Independence with exercise equipment Exercise tolerated well No report of concerns or symptoms today Strength training completed today  Goals Unmet:  Not Applicable  Comments: Pt able to follow exercise prescription today without complaint.  Will continue to monitor for progression.    Dr. Bethann Punches is Medical Director for [facility] Cardiac Rehabilitation.  Dr. Vida Rigger is Medical Director for [facility] Pulmonary Rehabilitation.

## 2022-12-20 ENCOUNTER — Encounter: Payer: BC Managed Care – PPO | Admitting: *Deleted

## 2022-12-20 DIAGNOSIS — J449 Chronic obstructive pulmonary disease, unspecified: Secondary | ICD-10-CM

## 2022-12-20 DIAGNOSIS — R0602 Shortness of breath: Secondary | ICD-10-CM | POA: Diagnosis not present

## 2022-12-20 NOTE — Progress Notes (Signed)
Discharge Summary   [name] "[name]" DOB: 06/05/1955   [name] graduated today from  rehab with 36 sessions completed.  Details of the patient's exercise prescription and what [name] needs to do in order to continue the prescription and progress were discussed with patient.  Patient was given a copy of prescription and goals.  Patient verbalized understanding. [name] plans to continue to exercise by walking.   6 Minute Walk     Row Name 08/06/22 1043 12/06/22 1728       6 Minute Walk   Phase Initial Discharge    Distance 1280 feet 1500 feet    Distance % Change -- 17 %    Distance Feet Change -- 220 ft    Walk Time 6 minutes 6 minutes    # of Rest Breaks 0 0    MPH 2.42 2.84    METS 2.86 3.56    RPE 11 12    Perceived Dyspnea  1 1    VO2 Peak 10.01 12.46    Symptoms Yes (comment) No    Comments Chronic Back Pain 9/10 --    Resting HR 68 bpm 73 bpm    Resting BP 126/74 118/70    Resting Oxygen Saturation  98 % 95 %    Exercise Oxygen Saturation  during 6 min walk 93 % 89 %    Max Ex. HR 105 bpm 127 bpm    Max Ex. BP 160/80 164/72    2 Minute Post BP 144/78 146/90      Interval HR   1 Minute HR 88 106    2 Minute HR 97 113    3 Minute HR 97 115    4 Minute HR 102 121    5 Minute HR 104 118    6 Minute HR 105 127    2 Minute Post HR 70 87    Interval Heart Rate? Yes Yes      Interval Oxygen   Interval Oxygen? Yes Yes    Baseline Oxygen Saturation % 98 % 95 %    1 Minute Oxygen Saturation % 93 % 93 %    1 Minute Liters of Oxygen 0 L 0 L    2 Minute Oxygen Saturation % 94 % 90 %    2 Minute Liters of Oxygen 0 L 0 L    3 Minute Oxygen Saturation % 94 % 91 %    3 Minute Liters of Oxygen 0 L 0 L    4 Minute Oxygen Saturation % 94 % 90 %    4 Minute Liters of Oxygen 0 L 0 L    5 Minute Oxygen Saturation % 93 % 89 %    5 Minute Liters of Oxygen 0 L 0 L    6 Minute Oxygen Saturation % 95 % 90 %    6 Minute Liters of Oxygen 0 L 0 L    2 Minute Post Oxygen Saturation % 96 % 90  %    2 Minute Post Liters of Oxygen 0 L 0 L

## 2022-12-20 NOTE — Progress Notes (Signed)
Pulmonary Individual Treatment Plan  Patient Details  Name: [name] MRN: [id number] Date of Birth: [birth year] Referring Provider:   Flowsheet Row Pulmonary Rehab from 08/06/2022 in [facility] Cardiac and Pulmonary Rehab  Referring Provider Dr. Sarina Ser, MD       Initial Encounter Date:  Flowsheet Row Pulmonary Rehab from 08/06/2022 in [facility] Cardiac and Pulmonary Rehab  Date 08/06/22       Visit Diagnosis: Shortness of breath  Chronic obstructive pulmonary disease, unspecified COPD type (HCC)  Patient's Home Medications on Admission:  Current Outpatient Medications:    albuterol (VENTOLIN HFA) 108 (90 Base) MCG/ACT inhaler, Inhale 2 puffs into the lungs every 6 (six) hours as needed for wheezing or shortness of breath., Disp: 8 g, Rfl: 2   atorvastatin (LIPITOR) 40 MG tablet, Take 1 tablet (40 mg total) by mouth daily., Disp: 90 tablet, Rfl: 1   citalopram (CELEXA) 40 MG tablet, Take 1.5 tablets (60 mg total) by mouth daily., Disp: 135 tablet, Rfl: 1   furosemide (LASIX) 20 MG tablet, Take 1 tablet (20 mg total) by mouth 3 (three) times a week., Disp: 45 tablet, Rfl: 3   levothyroxine (SYNTHROID) 125 MCG tablet, Take 1 tablet (125 mcg total) by mouth daily with breakfast., Disp: 90 tablet, Rfl: 3   pantoprazole (PROTONIX) 20 MG tablet, Take 1 tablet (20 mg total) by mouth daily., Disp: 90 tablet, Rfl: 3   propafenone (RYTHMOL) 225 MG tablet, TAKE 1 TABLET BY MOUTH DAILY AS NEEDED FOR AFIB, Disp: 30 tablet, Rfl: 1   Spacer/Aero-Holding Chambers DEVI, 1 Device by Does not apply route as needed., Disp: 2 each, Rfl: 0   Tiotropium Bromide-Olodaterol (STIOLTO RESPIMAT) 2.5-2.5 MCG/ACT AERS, Inhale 2 puffs into the lungs daily., Disp: 4 g, Rfl: 5 No current facility-administered medications for this visit.  Facility-Administered Medications Ordered in Other Visits:    albuterol (PROVENTIL) (2.5 MG/3ML) 0.083% nebulizer solution 2.5 mg, 2.5 mg, Nebulization, Once, Duke Salvia,  MD  Past Medical History: Past Medical History:  Diagnosis Date   Anxiety state, unspecified    Back pain    Chest pain    a. 2008 Cath: reportedly nl;  b. 09/2012 Lexi MV: EF 75%, soft tissue attenuation->Low risk.   COPD (chronic obstructive pulmonary disease) (HCC)    CTS (carpal tunnel syndrome)    Depression    GERD (gastroesophageal reflux disease)    Migraines    Neck pain    Nontoxic multinodular goiter    Other and unspecified hyperlipidemia    Other and unspecified ovarian cyst    left   Other chest pain    PAF (paroxysmal atrial fibrillation) (HCC)    a. 07/2007 Echo: EF 65%, mildly dil LA;  b. currently on propafenone;  c. CHA2DS2VASc = 1 (gender)-->No anticoagulation.   PTSD (post-traumatic stress disorder)    Tobacco use    Tobacco use disorder    Unspecified hypothyroidism    Unspecified sleep apnea    resolved s/p UPPP surgery   Vitamin B12 deficiency     Tobacco Use: Social History   Tobacco Use  Smoking Status Former   Current packs/day: 1.50   Average packs/day: 1.5 packs/day for 39.0 years (58.5 ttl pk-yrs)   Types: Cigarettes  Smokeless Tobacco Never  Tobacco Comments   Quit smoking cigarettes 10/20/2022 khj    Labs: Review Flowsheet  More data exists      Latest Ref Rng & Units 10/09/2017 11/20/2019 01/30/2021 03/26/2022 09/24/2022  Labs for ITP Cardiac and  Pulmonary Rehab  Cholestrol 100 - 199 mg/dL 540  981  191  478  295   LDL (calc) 0 - 99 mg/dL 621  308  82  657  846   HDL-C >39 mg/dL 45  41  39  55  45   Trlycerides 0 - 149 mg/dL 962  952  841  324  401   Hemoglobin A1c 4.8 - 5.6 % - - 5.5  - -    Details             Pulmonary Assessment Scores:  Pulmonary Assessment Scores     Row Name 08/06/22 1034 12/06/22 1735 12/20/22 1721     ADL UCSD   ADL Phase Entry Exit Exit   SOB Score total 39 -- 18   Rest 0 -- 0   Walk 3 -- 1   Stairs 5 -- 2   Bath 1 -- 0   Dress 1 -- 0   Shop 1 -- 0     CAT Score   CAT Score 8 -- 8      mMRC Score   mMRC Score 1 0 --            UCSD: Self-administered rating of dyspnea associated with activities of daily living (ADLs) 6-point scale (0 = "not at all" to 5 = "maximal or unable to do because of breathlessness")  Scoring Scores range from 0 to 120.  Minimally important difference is 5 units  CAT: CAT can identify the health impairment of COPD patients and is better correlated with disease progression.  CAT has a scoring range of zero to 40. The CAT score is classified into four groups of low (less than 10), medium (10 - 20), high (21-30) and very high (31-40) based on the impact level of disease on health status. A CAT score over 10 suggests significant symptoms.  A worsening CAT score could be explained by an exacerbation, poor medication adherence, poor inhaler technique, or progression of COPD or comorbid conditions.  CAT MCID is 2 points  mMRC: mMRC (Modified Medical Research Council) Dyspnea Scale is used to assess the degree of baseline functional disability in patients of respiratory disease due to dyspnea. No minimal important difference is established. A decrease in score of 1 point or greater is considered a positive change.   Pulmonary Function Assessment:   Exercise Target Goals: Exercise Program Goal: Individual exercise prescription set using results from initial 6 min walk test and THRR while considering  patient's activity barriers and safety.   Exercise Prescription Goal: Initial exercise prescription builds to 30-45 minutes a day of aerobic activity, 2-3 days per week.  Home exercise guidelines will be given to patient during program as part of exercise prescription that the participant will acknowledge.  Education: Aerobic Exercise: - Group verbal and visual presentation on the components of exercise prescription. Introduces F.I.T.T principle from ACSM for exercise prescriptions.  Reviews F.I.T.T. principles of aerobic exercise including progression.  Written material given at graduation. Flowsheet Row Pulmonary Rehab from 09/12/2022 in [facility] Cardiac and Pulmonary Rehab  Education need identified 08/06/22       Education: Resistance Exercise: - Group verbal and visual presentation on the components of exercise prescription. Introduces F.I.T.T principle from ACSM for exercise prescriptions  Reviews F.I.T.T. principles of resistance exercise including progression. Written material given at graduation.    Education: Exercise & Equipment Safety: - Individual verbal instruction and demonstration of equipment use and safety with use of the equipment. Flowsheet  Row Pulmonary Rehab from 09/12/2022 in [facility] Cardiac and Pulmonary Rehab  Date 08/06/22  Educator NT  Instruction Review Code 1- Verbalizes Understanding       Education: Exercise Physiology & General Exercise Guidelines: - Group verbal and written instruction with models to review the exercise physiology of the cardiovascular system and associated critical values. Provides general exercise guidelines with specific guidelines to those with heart or lung disease.    Education: Flexibility, Balance, Mind/Body Relaxation: - Group verbal and visual presentation with interactive activity on the components of exercise prescription. Introduces F.I.T.T principle from ACSM for exercise prescriptions. Reviews F.I.T.T. principles of flexibility and balance exercise training including progression. Also discusses the mind body connection.  Reviews various relaxation techniques to help reduce and manage stress (i.e. Deep breathing, progressive muscle relaxation, and visualization). Balance handout provided to take home. Written material given at graduation.   Activity Barriers & Risk Stratification:  Activity Barriers & Cardiac Risk Stratification - 08/06/22 1050       Activity Barriers & Cardiac Risk Stratification   Activity Barriers Back Problems;Shortness of Breath;Joint Problems              6 Minute Walk:  6 Minute Walk     Row Name 08/06/22 1043 12/06/22 1728       6 Minute Walk   Phase Initial Discharge    Distance 1280 feet 1500 feet    Distance % Change -- 17 %    Distance Feet Change -- 220 ft    Walk Time 6 minutes 6 minutes    # of Rest Breaks 0 0    MPH 2.42 2.84    METS 2.86 3.56    RPE 11 12    Perceived Dyspnea  1 1    VO2 Peak 10.01 12.46    Symptoms Yes (comment) No    Comments Chronic Back Pain 9/10 --    Resting HR 68 bpm 73 bpm    Resting BP 126/74 118/70    Resting Oxygen Saturation  98 % 95 %    Exercise Oxygen Saturation  during 6 min walk 93 % 89 %    Max Ex. HR 105 bpm 127 bpm    Max Ex. BP 160/80 164/72    2 Minute Post BP 144/78 146/90      Interval HR   1 Minute HR 88 106    2 Minute HR 97 113    3 Minute HR 97 115    4 Minute HR 102 121    5 Minute HR 104 118    6 Minute HR 105 127    2 Minute Post HR 70 87    Interval Heart Rate? Yes Yes      Interval Oxygen   Interval Oxygen? Yes Yes    Baseline Oxygen Saturation % 98 % 95 %    1 Minute Oxygen Saturation % 93 % 93 %    1 Minute Liters of Oxygen 0 L 0 L    2 Minute Oxygen Saturation % 94 % 90 %    2 Minute Liters of Oxygen 0 L 0 L    3 Minute Oxygen Saturation % 94 % 91 %    3 Minute Liters of Oxygen 0 L 0 L    4 Minute Oxygen Saturation % 94 % 90 %    4 Minute Liters of Oxygen 0 L 0 L    5 Minute Oxygen Saturation % 93 % 89 %  5 Minute Liters of Oxygen 0 L 0 L    6 Minute Oxygen Saturation % 95 % 90 %    6 Minute Liters of Oxygen 0 L 0 L    2 Minute Post Oxygen Saturation % 96 % 90 %    2 Minute Post Liters of Oxygen 0 L 0 L            Oxygen Initial Assessment:  Oxygen Initial Assessment - 07/26/22 1333       Home Oxygen   Home Oxygen Device None    Sleep Oxygen Prescription None    Home Exercise Oxygen Prescription None    Home Resting Oxygen Prescription None    Compliance with Home Oxygen Use Yes      Initial 6 min Walk   Oxygen Used None       Program Oxygen Prescription   Program Oxygen Prescription None      Intervention   Short Term Goals To learn and understand importance of monitoring SPO2 with pulse oximeter and demonstrate accurate use of the pulse oximeter.;To learn and understand importance of maintaining oxygen saturations>88%;To learn and demonstrate proper pursed lip breathing techniques or other breathing techniques. ;To learn and demonstrate proper use of respiratory medications    Long  Term Goals Verbalizes importance of monitoring SPO2 with pulse oximeter and return demonstration;Maintenance of O2 saturations>88%;Exhibits proper breathing techniques, such as pursed lip breathing or other method taught during program session;Compliance with respiratory medication;Demonstrates proper use of MDI's             Oxygen Re-Evaluation:  Oxygen Re-Evaluation     Row Name 08/13/22 1723 09/12/22 1731 10/11/22 1739 11/19/22 1747       Program Oxygen Prescription   Program Oxygen Prescription -- None None None      Home Oxygen   Home Oxygen Device -- None None None    Sleep Oxygen Prescription -- CPAP CPAP None    Home Exercise Oxygen Prescription -- None None None    Home Resting Oxygen Prescription -- None None None    Compliance with Home Oxygen Use -- Yes Yes --      Goals/Expected Outcomes   Short Term Goals -- To learn and understand importance of monitoring SPO2 with pulse oximeter and demonstrate accurate use of the pulse oximeter.;To learn and understand importance of maintaining oxygen saturations>88%;To learn and demonstrate proper pursed lip breathing techniques or other breathing techniques. ;To learn and demonstrate proper use of respiratory medications To learn and understand importance of maintaining oxygen saturations>88%;To learn and understand importance of monitoring SPO2 with pulse oximeter and demonstrate accurate use of the pulse oximeter. Other    Long  Term Goals -- Verbalizes importance of  monitoring SPO2 with pulse oximeter and return demonstration;Maintenance of O2 saturations>88%;Exhibits proper breathing techniques, such as pursed lip breathing or other method taught during program session;Compliance with respiratory medication;Demonstrates proper use of MDI's Maintenance of O2 saturations>88%;Verbalizes importance of monitoring SPO2 with pulse oximeter and return demonstration Other    Comments Reviewed PLB technique with pt.  Talked about how it works and it's importance in maintaining their exercise saturations. Patient reported that she uses her CPAP as consistent as she can at home. She aslo reports that she feels comfortable using PLB when she has shortness of breath. Patient has a pulse oximeter to check oxygen saturation at home. Informed and explained to patient why it is important to check oxygen at rest and on exertion at home. Informed patient that  they should be 88 percent and above with their oxygen readings and that it is important to have levels above 88 to prevent damage to tissues in the body. Patient verbalizes understanding. [name] states that her latest sleep study states that she does not have sleep apnea. She has an oxygen concentrator but does not use it. She does not have any questions about her respiratory status.    Goals/Expected Outcomes Short: Become more profiecient at using PLB.   Long: Become independent at using PLB. Short: Become more profiecient at using PLB. continue to try to be consistent with CPAP use at night.   Long: Become independent at using PLB. Short: monitor oxygen at rest and with exertion at home. Long: maintain oxygen saturations above 88 percent independently. Short: continue working on breathing techniques. Long: graduate [employer].             Oxygen Discharge (Final Oxygen Re-Evaluation):  Oxygen Re-Evaluation - 11/19/22 1747       Program Oxygen Prescription   Program Oxygen Prescription None      Home Oxygen   Home Oxygen  Device None    Sleep Oxygen Prescription None    Home Exercise Oxygen Prescription None    Home Resting Oxygen Prescription None      Goals/Expected Outcomes   Short Term Goals Other    Long  Term Goals Other    Comments [name] states that her latest sleep study states that she does not have sleep apnea. She has an oxygen concentrator but does not use it. She does not have any questions about her respiratory status.    Goals/Expected Outcomes Short: continue working on breathing techniques. Long: graduate [employer].             Initial Exercise Prescription:  Initial Exercise Prescription - 08/06/22 1100       Date of Initial Exercise RX and Referring Provider   Date 08/06/22    Referring Provider Dr. Sarina Ser, MD      Oxygen   Maintain Oxygen Saturation 88% or higher      Treadmill   MPH 2.4    Grade 0    Minutes 15    METs 2.84      Recumbant Bike   Level 2    RPM 50    Watts 26    Minutes 15    METs 2.86      NuStep   Level 2    SPM 80    Minutes 15    METs 2.86      Biostep-RELP   Level 2    SPM 50    Minutes 15    METs 2.86      Prescription Details   Frequency (times per week) 2    Duration Progress to 30 minutes of continuous aerobic without signs/symptoms of physical distress      Intensity   THRR 40-80% of Max Heartrate 102-136    Ratings of Perceived Exertion 11-13    Perceived Dyspnea 0-4      Progression   Progression Continue to progress workloads to maintain intensity without signs/symptoms of physical distress.      Resistance Training   Training Prescription Yes    Weight 4 lb    Reps 10-15             Perform Capillary Blood Glucose checks as needed.  Exercise Prescription Changes:   Exercise Prescription Changes     Row Name 08/06/22 1100 08/15/22 1700 08/27/22 1300  09/10/22 1100 09/24/22 1300     Response to Exercise   Blood Pressure (Admit) 126/74 138/84 136/70 124/70 122/72   Blood Pressure (Exercise)  160/80 166/86 160/82 138/64 144/80   Blood Pressure (Exit) 144/78 134/72 112/74 104/62 112/66   Heart Rate (Admit) 68 bpm 78 bpm 84 bpm 83 bpm 75 bpm   Heart Rate (Exercise) 105 bpm 118 bpm 114 bpm 117 bpm 102 bpm   Heart Rate (Exit) 70 bpm 80 bpm 93 bpm 89 bpm 92 bpm   Oxygen Saturation (Admit) 98 % 96 % 95 % 94 % 95 %   Oxygen Saturation (Exercise) 93 % 93 % 91 % 91 % 91 %   Oxygen Saturation (Exit) 96 % 97 % 93 % 95 % 92 %   Rating of Perceived Exertion (Exercise) 11 11 13 12 13    Perceived Dyspnea (Exercise) 1 -- -- -- --   Symptoms Chronic Back Pain 9/10 none none none none   Comments Results 1st full day of exercise -- -- --   Duration -- Progress to 30 minutes of  aerobic without signs/symptoms of physical distress Continue with 30 min of aerobic exercise without signs/symptoms of physical distress. Continue with 30 min of aerobic exercise without signs/symptoms of physical distress. Continue with 30 min of aerobic exercise without signs/symptoms of physical distress.   Intensity -- THRR unchanged THRR unchanged THRR unchanged THRR unchanged     Progression   Progression -- Continue to progress workloads to maintain intensity without signs/symptoms of physical distress. Continue to progress workloads to maintain intensity without signs/symptoms of physical distress. Continue to progress workloads to maintain intensity without signs/symptoms of physical distress. Continue to progress workloads to maintain intensity without signs/symptoms of physical distress.   Average METs -- 2.62 2.45 2.95 2.89     Resistance Training   Training Prescription -- Yes Yes Yes Yes   Weight -- 4 lb 4 lb 4 lb 4 lb   Reps -- 10-15 10-15 10-15 10-15     Interval Training   Interval Training -- No No No No     Treadmill   MPH -- 1.9 2.1 2.4 2.4   Grade -- 0 0 0 0   Minutes -- 15 15 15 15    METs -- 2.45 2.61 2.84 2.84     Recumbant Bike   Level -- -- 2 3 5    Watts -- -- 22 27 34   Minutes -- --  15 15 15    METs -- -- 2.65 2.87 3.08     NuStep   Level -- 2 2 3 4    Minutes -- 15 15 15 15    METs -- 2.8 2.3 3.1 3.4     Biostep-RELP   Level -- -- -- -- 1   Minutes -- -- -- -- 15   METs -- -- -- -- 3     Oxygen   Maintain Oxygen Saturation -- 88% or higher 88% or higher 88% or higher 88% or higher    Row Name 10/04/22 1800 10/08/22 1400 10/22/22 1400 11/09/22 0800 11/22/22 0700     Response to Exercise   Blood Pressure (Admit) -- 124/76 138/76 132/76 102/74   Blood Pressure (Exit) -- 132/76 120/72 112/72 118/70   Heart Rate (Admit) -- 72 bpm 84 bpm 75 bpm 75 bpm   Heart Rate (Exercise) -- 86 bpm 107 bpm 102 bpm 98 bpm   Heart Rate (Exit) -- 79 bpm 92 bpm 81 bpm 80 bpm  Oxygen Saturation (Admit) -- 92 % 95 % 96 % 95 %   Oxygen Saturation (Exercise) -- 90 % 91 % 90 % 93 %   Oxygen Saturation (Exit) -- 90 % 95 % 91 % 94 %   Rating of Perceived Exertion (Exercise) -- 11 13 13 12    Perceived Dyspnea (Exercise) -- 0 1 1 0   Symptoms -- none none none none   Duration -- Continue with 30 min of aerobic exercise without signs/symptoms of physical distress. Continue with 30 min of aerobic exercise without signs/symptoms of physical distress. Continue with 30 min of aerobic exercise without signs/symptoms of physical distress. Continue with 30 min of aerobic exercise without signs/symptoms of physical distress.   Intensity -- THRR unchanged THRR unchanged THRR unchanged THRR unchanged     Progression   Progression -- Continue to progress workloads to maintain intensity without signs/symptoms of physical distress. Continue to progress workloads to maintain intensity without signs/symptoms of physical distress. Continue to progress workloads to maintain intensity without signs/symptoms of physical distress. Continue to progress workloads to maintain intensity without signs/symptoms of physical distress.   Average METs -- 2.8 2.86 2.98 3.32     Resistance Training   Training Prescription  -- Yes Yes Yes Yes   Weight -- 4 lb 4 lb 4 lb 4 lb   Reps -- 10-15 10-15 10-15 10-15     Interval Training   Interval Training -- No No No No     Treadmill   MPH -- -- 2.4 2.6 2.7   Grade -- -- 0 0 0   Minutes -- -- 15 15 15    METs -- -- 2.84 2.99 3.07     Recumbant Bike   Level -- -- 5 6 5    Watts -- -- 41 40 41   Minutes -- -- 15 15 15    METs -- -- 3.31 -- 3.32     NuStep   Level -- 4 4 5 4    Minutes -- 15 15 15 15    METs -- 2.6 3.4 3.1 2.9     REL-XR   Level -- -- 3 -- --   Minutes -- -- 15 -- --   METs -- -- 3.3 -- --     Biostep-RELP   Level -- 3 1 4 4    Minutes -- 15 15 15 15    METs -- -- 2 3 4      Home Exercise Plan   Plans to continue exercise at Home (comment)  Walking twice a week Home (comment)  Walking twice a week Home (comment)  Walking twice a week Home (comment)  Walking twice a week Home (comment)  Walking twice a week   Frequency Add 3 additional days to program exercise sessions. Add 3 additional days to program exercise sessions. Add 3 additional days to program exercise sessions. Add 3 additional days to program exercise sessions. Add 3 additional days to program exercise sessions.   Initial Home Exercises Provided 10/04/22 10/04/22 10/04/22 10/04/22 10/04/22     Oxygen   Maintain Oxygen Saturation 88% or higher 88% or higher 88% or higher 88% or higher 88% or higher    Row Name 12/06/22 0700 12/18/22 1400           Response to Exercise   Blood Pressure (Admit) 122/80 120/68      Blood Pressure (Exit) 128/80 118/62      Heart Rate (Admit) 76 bpm 72 bpm      Heart Rate (Exercise)  105 bpm 103 bpm      Heart Rate (Exit) 84 bpm 78 bpm      Oxygen Saturation (Admit) 96 % 98 %      Oxygen Saturation (Exercise) 91 % 93 %      Oxygen Saturation (Exit) 94 % 94 %      Rating of Perceived Exertion (Exercise) 12 15      Perceived Dyspnea (Exercise) 1 1      Symptoms none none      Duration Continue with 30 min of aerobic exercise without  signs/symptoms of physical distress. Continue with 30 min of aerobic exercise without signs/symptoms of physical distress.      Intensity THRR unchanged THRR unchanged        Progression   Progression Continue to progress workloads to maintain intensity without signs/symptoms of physical distress. Continue to progress workloads to maintain intensity without signs/symptoms of physical distress.      Average METs 3.34 3.16        Resistance Training   Training Prescription Yes Yes      Weight 4 lb 5 lb      Reps 10-15 10-15        Interval Training   Interval Training No No        Treadmill   MPH 2.7 2.7      Grade 0 0      Minutes 15 15      METs 3.07 3.07        Recumbant Bike   Level 5 6      Watts 41 40      Minutes 15 15      METs 3.34 --        NuStep   Level 4 4      Minutes 15 15      METs 3.1 4.4        Biostep-RELP   Level 4 --      Minutes 15 --      METs 4 --        Home Exercise Plan   Plans to continue exercise at Home (comment)  Walking twice a week Home (comment)  Walking twice a week      Frequency Add 3 additional days to program exercise sessions. Add 3 additional days to program exercise sessions.      Initial Home Exercises Provided 10/04/22 10/04/22        Oxygen   Maintain Oxygen Saturation 88% or higher 88% or higher               Exercise Comments:   Exercise Comments     Row Name 08/13/22 1721           Exercise Comments First full day of exercise!  Patient was oriented to gym and equipment including functions, settings, policies, and procedures.  Patient's individual exercise prescription and treatment plan were reviewed.  All starting workloads were established based on the results of the 6 minute walk test done at initial orientation visit.  The plan for exercise progression was also introduced and progression will be customized based on patient's performance and goals.                Exercise Goals and Review:    Exercise Goals     Row Name 08/06/22 1050             Exercise Goals   Increase Physical Activity Yes       Intervention  Provide advice, education, support and counseling about physical activity/exercise needs.;Develop an individualized exercise prescription for aerobic and resistive training based on initial evaluation findings, risk stratification, comorbidities and participant's personal goals.       Expected Outcomes Short Term: Attend rehab on a regular basis to increase amount of physical activity.;Long Term: Add in home exercise to make exercise part of routine and to increase amount of physical activity.;Long Term: Exercising regularly at least 3-5 days a week.       Increase Strength and Stamina Yes       Intervention Provide advice, education, support and counseling about physical activity/exercise needs.;Develop an individualized exercise prescription for aerobic and resistive training based on initial evaluation findings, risk stratification, comorbidities and participant's personal goals.       Expected Outcomes Short Term: Increase workloads from initial exercise prescription for resistance, speed, and METs.;Short Term: Perform resistance training exercises routinely during rehab and add in resistance training at home;Long Term: Improve cardiorespiratory fitness, muscular endurance and strength as measured by increased METs and functional capacity ( )       Able to understand and use rate of perceived exertion (RPE) scale Yes       Intervention Provide education and explanation on how to use RPE scale       Expected Outcomes Short Term: Able to use RPE daily in rehab to express subjective intensity level;Long Term:  Able to use RPE to guide intensity level when exercising independently       Able to understand and use Dyspnea scale Yes       Intervention Provide education and explanation on how to use Dyspnea scale       Expected Outcomes Short Term: Able to use Dyspnea scale  daily in rehab to express subjective sense of shortness of breath during exertion;Long Term: Able to use Dyspnea scale to guide intensity level when exercising independently       Knowledge and understanding of Target Heart Rate Range (THRR) Yes       Intervention Provide education and explanation of THRR including how the numbers were predicted and where they are located for reference       Expected Outcomes Short Term: Able to state/look up THRR;Long Term: Able to use THRR to govern intensity when exercising independently;Short Term: Able to use daily as guideline for intensity in rehab       Able to check pulse independently Yes       Intervention Provide education and demonstration on how to check pulse in carotid and radial arteries.;Review the importance of being able to check your own pulse for safety during independent exercise       Expected Outcomes Short Term: Able to explain why pulse checking is important during independent exercise;Long Term: Able to check pulse independently and accurately       Understanding of Exercise Prescription Yes       Intervention Provide education, explanation, and written materials on patient's individual exercise prescription       Expected Outcomes Short Term: Able to explain program exercise prescription;Long Term: Able to explain home exercise prescription to exercise independently                Exercise Goals Re-Evaluation :  Exercise Goals Re-Evaluation     Row Name 08/13/22 1722 08/15/22 1801 08/27/22 1315 09/10/22 1158 09/12/22 1755     Exercise Goal Re-Evaluation   Exercise Goals Review Increase Physical Activity;Able to understand and use rate of perceived exertion (RPE) scale;Knowledge  and understanding of Target Heart Rate Range (THRR);Understanding of Exercise Prescription;Increase Strength and Stamina;Able to understand and use Dyspnea scale;Able to check pulse independently Increase Physical Activity;Increase Strength and  Stamina;Understanding of Exercise Prescription Increase Physical Activity;Increase Strength and Stamina;Understanding of Exercise Prescription Increase Physical Activity;Increase Strength and Stamina;Understanding of Exercise Prescription Increase Physical Activity;Increase Strength and Stamina;Understanding of Exercise Prescription   Comments Reviewed RPE scale, THR and program prescription with pt today.  Pt voiced understanding and was given a copy of goals to take home. [name] did well for her first session of rehab.  She was able to do level 2 on the Nustep and 1.9 mph on the treadmill. Her RPEs are appropriate  with exercise. We will continue to monitor as she continues to progress in the program. [name] continues to do well in the program. She recently increased her treadmill speed to 2.1 mph with no incline. She also began using the recumbent bike and did well with level 2. She has continued to use 4 lb hand weights for resistance training as well. We will continue to monitor her progress in the program. [name] continues to do well in rehab. She increased her treadmill speed to 2.4 mph. She could benefit from adding an incline to her prescription. She also increased to level 3 on the recumbent bike and T4 Nustep. All with appropriate RPEs.She continues to reach her THR most sessions. Will continue to monitor. Patient reports that she is procressing with her exercise in class and continue to increase her work loads when tolerated. She has not been exercising at home but packing boxes. She has some frustration that she can not do things at home such as lifiting and walking like she used to be able to do. Time prohibited going over home exercise guidelines today but this will be done in the near future with program staff.   Expected Outcomes Short: Use RPE daily to regulate intensity.  Long: Follow program prescription in THR. Short: Continue to exercise at initial exercise prescription Long: Increase overall MET level  and stamina Short: Try to add incline to treadmill workload. Long: Continue to improve strength and stamina. Short:Add small incline to treadmill Long: Continue to increase overall MET level and stamina Short: go over home exercise guidelines with staff memeber. Long: become independent with exercise program.    Row Name 09/24/22 1354 10/04/22 1818 10/08/22 1448 10/22/22 1408 11/09/22 0828     Exercise Goal Re-Evaluation   Exercise Goals Review Increase Physical Activity;Increase Strength and Stamina;Understanding of Exercise Prescription Increase Physical Activity;Increase Strength and Stamina;Able to understand and use rate of perceived exertion (RPE) scale;Able to understand and use Dyspnea scale;Able to check pulse independently;Knowledge and understanding of Target Heart Rate Range (THRR);Understanding of Exercise Prescription Increase Physical Activity;Increase Strength and Stamina;Understanding of Exercise Prescription Increase Physical Activity;Increase Strength and Stamina;Understanding of Exercise Prescription Increase Physical Activity;Increase Strength and Stamina;Understanding of Exercise Prescription   Comments [name] continues to do well in rehab. She continues to walk on the treadmill at a speed of 2.4 mph with an incline of 0%. She also improved to level 5 on the recumbent bike and level 4 on the T4 nustep. She has conitnued to use 3 lb hand weights for resistance training as well. We will continue to monitor her progress in the program. Reviewed home exercise with pt today.  Pt plans to walk at home for exercise.  Reviewed THR, pulse, RPE, sign and symptoms, pulse oximetery and when to call 911 or MD.  Also  discussed weather considerations and indoor options.  Pt voiced understanding. [name] is doing well in rehab.  She [place] up to level 3 on the BioStep.  Her RPE is staying around 11 so we will encourage her to push her spm or increase her workload to aim for 12-13 on RPE. [name] is doing well in rehab.  She continues to work at level 4 on the T4 nustep and level 5 on the recumbent bike. She also began using the XR at level and did well. She continues to do well walking on the treadmill at a speed of 2.4 mph with no incline as well. We will continue to monitor her progress in the program. [name] is doing well in rehab. She improved her workloads on the biostep, T4 nustep, and recumbent bike. She also increased her treadmill speed to 2.6 mph with no incline. She continues to do well with 4 lb hand weights for resistance training as well. We will continue to monitor her progress in the program.   Expected Outcomes Short: Continue to progressively increase treadmill workload. Long: Continue to improve strength and stamina. Short: Begin walking at home twice a week in addition to rehab. Long: Continue to exercise independently. Short: Increase RPM or level on equipment for RPE 12-13 Long: Continue to improve stamina Short: Add incline to treadmill workload. Long: Continue to improve strength and stamina. Short: Try 5 lb hand weights for resistance training. Long: Continue to improve strength and stamina.    Row Name 11/22/22 0747 12/06/22 0747 12/18/22 1455         Exercise Goal Re-Evaluation   Exercise Goals Review Increase Physical Activity;Increase Strength and Stamina;Understanding of Exercise Prescription Increase Physical Activity;Increase Strength and Stamina;Understanding of Exercise Prescription Increase Physical Activity;Increase Strength and Stamina;Understanding of Exercise Prescription     Comments [name] continues to do well in rehab. She recently increased her treadmill speed to 2.7 mph with no incline. She also has continued to work at level 4 on both the T4 nustep and biostep, and level 5 on the recumbent bike. She has done well with 4 lb hand weights for resistance training as well. We will continue to monitor her progress in the program. [name] continues to do well in rehab. She has stayed consistent  with her treadmill speed at 2.7 mph with no incline. She also has continued to work at level 4 on both the T4 nustep and biostep, and level 5 on the recumbent bike. She continues to do well with 4 lb hand weights for resistance training as well. We will continue to monitor her progress in the program. [name] continues to do well in rehab and is close to graduating. She recently completed her post and improved by 17%! She also has kept her workloads consistent on the T4 nustep and recumbent bike. We will continue to monitor her progress in the program.     Expected Outcomes Short: Try 5 lb hand weights for resistance training. Long: Continue exercise to improve strength and stamina. Short: Add incline to treadmill workload. Long: Continue exercise to improve strength and stamina. Short: Graduate. Long: Continue to exercise independently.              Discharge Exercise Prescription (Final Exercise Prescription Changes):  Exercise Prescription Changes - 12/18/22 1400       Response to Exercise   Blood Pressure (Admit) 120/68    Blood Pressure (Exit) 118/62    Heart Rate (Admit) 72 bpm    Heart Rate (Exercise)  103 bpm    Heart Rate (Exit) 78 bpm    Oxygen Saturation (Admit) 98 %    Oxygen Saturation (Exercise) 93 %    Oxygen Saturation (Exit) 94 %    Rating of Perceived Exertion (Exercise) 15    Perceived Dyspnea (Exercise) 1    Symptoms none    Duration Continue with 30 min of aerobic exercise without signs/symptoms of physical distress.    Intensity THRR unchanged      Progression   Progression Continue to progress workloads to maintain intensity without signs/symptoms of physical distress.    Average METs 3.16      Resistance Training   Training Prescription Yes    Weight 5 lb    Reps 10-15      Interval Training   Interval Training No      Treadmill   MPH 2.7    Grade 0    Minutes 15    METs 3.07      Recumbant Bike   Level 6    Watts 40    Minutes 15      NuStep    Level 4    Minutes 15    METs 4.4      Home Exercise Plan   Plans to continue exercise at Home (comment)   Walking twice a week   Frequency Add 3 additional days to program exercise sessions.    Initial Home Exercises Provided 10/04/22      Oxygen   Maintain Oxygen Saturation 88% or higher             Nutrition:  Target Goals: Understanding of nutrition guidelines, daily intake of sodium 1500mg , cholesterol 200mg , calories 30% from fat and 7% or less from saturated fats, daily to have 5 or more servings of fruits and vegetables.  Education: All About Nutrition: -Group instruction provided by verbal, written material, interactive activities, discussions, models, and posters to present general guidelines for heart healthy nutrition including fat, fiber, MyPlate, the role of sodium in heart healthy nutrition, utilization of the nutrition label, and utilization of this knowledge for meal planning. Follow up email sent as well. Written material given at graduation.   Biometrics:  Pre Biometrics - 08/06/22 1051       Pre Biometrics   Height 5\' 5"  (1.651 m)    Weight 215 lb 9.6 oz (97.8 kg)    Waist Circumference 41.5 inches    Hip Circumference 43 inches    Waist to Hip Ratio 0.97 %    BMI (Calculated) 35.88    Single Leg Stand 30 seconds             Post Biometrics - 12/06/22 1734        Post  Biometrics   Height 5\' 5"  (1.651 m)    Weight 214 lb (97.1 kg)    BMI (Calculated) 35.61    Single Leg Stand 19.66 seconds             Nutrition Therapy Plan and Nutrition Goals:  Nutrition Therapy & Goals - 08/20/22 1722       Nutrition Therapy   Diet Heart healthy, low Na    Drug/Food Interactions Statins/Certain Fruits    Protein (specify units) 60-70g    Fiber 25 grams    Whole Grain Foods 3 servings    Saturated Fats 12 max. grams    Fruits and Vegetables 8 servings/day    Sodium 2 grams      Personal Nutrition Goals  Nutrition Goal ST: include 6-8  smaller meals per day including heart healthy fats, fiber, and limiting salt LT: follow MyPlate guidelines    Comments 67 y.o. F admitted to pulmonary rehab for COPD. PMHx includes GERD, hypothyroidism, CKD stg 3, HLD, tobacco use, anxiety/depression, PTSD, vit B12 deficiency, OSA. PSHx includes cholecystectomy, thyroidectomy, parathyroidectomy. Documented homelessness 09/22/21, now housed. Labs reviewed. Medications reviewed atorvastatin, furosemide, synthroid, pantoprazole, celexa. [name] reports being unaware of any CKD stg 3 diagnosis -  discussed how that is in her past medical history and encouraged her to speak with her MD regarding this. [name] reports she would like to get back to 6-8 meals per day and cooking more heart healthy meals as she used to eat this way and enjoyed it - she lives with someone and is not able to eat this way; she hopes to find a new living situation within 2 weeks. She reports not eating much 15-30 oz of water raisin toast or multigrain peaches (canned in it's own juice) and pineapples. L: Small plate of leftovers (usually beans) D: beans with frozen steamable broccoli. Drinks: water with coffee (2-3 10-12oz cups). Discussed heart healthy eating and CKD stg 3 MNT.      Intervention Plan   Intervention Prescribe, educate and counsel regarding individualized specific dietary modifications aiming towards targeted core components such as weight, hypertension, lipid management, diabetes, heart failure and other comorbidities.    Expected Outcomes Short Term Goal: A plan has been developed with personal nutrition goals set during dietitian appointment.;Short Term Goal: Understand basic principles of dietary content, such as calories, fat, sodium, cholesterol and nutrients.;Long Term Goal: Adherence to prescribed nutrition plan.             Nutrition Assessments:  MEDIFICTS Score Key: ?70 Need to make dietary changes  40-70 Heart Healthy Diet ? 40 Therapeutic Level Cholesterol  Diet  Flowsheet Row Pulmonary Rehab from 12/20/2022 in [facility]  Picture Your Plate Total Score on Discharge 74      Picture Your Plate Scores: <82 Unhealthy dietary pattern with much room for improvement. 41-50 Dietary pattern unlikely to meet recommendations for good health and room for improvement. 51-60 More healthful dietary pattern, with some room for improvement.  >60 Healthy dietary pattern, although there may be some specific behaviors that could be improved.   Nutrition Goals Re-Evaluation:  Nutrition Goals Re-Evaluation     Row Name 09/12/22 1735 10/11/22 1745 11/19/22 1755         Goals   Current Weight -- 215 lb (97.5 kg) 211 lb (95.7 kg)     Nutrition Goal ST: include 6-8 smaller meals per day including heart healthy fats, fiber, and limiting salt LT: follow MyPlate guidelines Eat smaller Eat smaller more frequent meals     Comment Patient reports that she is trying to follow dietary recommendations but has some instability in her living situation and ability to always cook on a routine schedule. She does report that she does not eat katchup becasue that causes her to retain fluid. Patient was informed on why it is important to maintain a balanced diet when dealing with Respiratory issues. Explained that it takes a lot of energy to breath and when they are short of breath often they will need to have a good diet to help keep up with the calories they are expending for breathing. Meet with RD.     Expected Outcome Short: continue to work on securing living situaiton so that she has  more control over her routine and ability to have access to a kitchen to cook healthy meals. Long: eat a heart healthy diet and eat 6-8 small meals a day once she has more stability in living situaiton. Short: Choose and plan snacks accordingly to patients caloric intake to improve breathing. Long: Maintain a diet independently that meets their caloric intake to aid in daily  shortness of breath. Short: meet with RD. Long: adhere to a diet that pertains to her.              Nutrition Goals Discharge (Final Nutrition Goals Re-Evaluation):  Nutrition Goals Re-Evaluation - 11/19/22 1755       Goals   Current Weight 211 lb (95.7 kg)    Nutrition Goal Eat smaller more frequent meals    Comment Meet with RD.    Expected Outcome Short: meet with RD. Long: adhere to a diet that pertains to her.             Psychosocial: Target Goals: Acknowledge presence or absence of significant depression and/or stress, maximize coping skills, provide positive support system. Participant is able to verbalize types and ability to use techniques and skills needed for reducing stress and depression.   Education: Stress, Anxiety, and Depression - Group verbal and visual presentation to define topics covered.  Reviews how body is impacted by stress, anxiety, and depression.  Also discusses healthy ways to reduce stress and to treat/manage anxiety and depression.  Written material given at graduation.   Education: Sleep Hygiene -Provides group verbal and written instruction about how sleep can affect your health.  Define sleep hygiene, discuss sleep cycles and impact of sleep habits. Review good sleep hygiene tips.    Initial Review & Psychosocial Screening:  Initial Psych Review & Screening - 07/26/22 1342       Initial Review   Current issues with Current Stress Concerns    Source of Stress Concerns Financial;Chronic Illness      Family Dynamics   Good Support System? Yes   daughters     Barriers   Psychosocial barriers to participate in program There are no identifiable barriers or psychosocial needs.;The patient should benefit from training in stress management and relaxation.      Screening Interventions   Interventions Encouraged to exercise;Provide feedback about the scores to participant    Expected Outcomes Short Term goal: Utilizing psychosocial counselor,  staff and physician to assist with identification of specific Stressors or current issues interfering with healing process. Setting desired goal for each stressor or current issue identified.;Long Term Goal: Stressors or current issues are controlled or eliminated.;Short Term goal: Identification and review with participant of any Quality of Life or Depression concerns found by scoring the questionnaire.;Long Term goal: The participant improves quality of Life and PHQ9 Scores as seen by post scores and/or verbalization of changes             Quality of Life Scores:  Scores of 19 and below usually indicate a poorer quality of life in these areas.  A difference of  2-3 points is a clinically meaningful difference.  A difference of 2-3 points in the total score of the Quality of Life Index has been associated with significant improvement in overall quality of life, self-image, physical symptoms, and general health in studies assessing change in quality of life.  PHQ-9: Review Flowsheet  More data exists      12/20/2022 09/24/2022 08/06/2022 03/26/2022 02/01/2022  Depression screen PHQ 2/9  Decreased Interest  0 1 0 0 0  Down, Depressed, Hopeless 0 1 0 0 0  PHQ - 2 Score 0 2 0 0 0  Altered sleeping 2 2 1 1 3   Tired, decreased energy 1 2 1 1  0  Change in appetite 0 1 0 0 0  Feeling bad or failure about yourself  0 0 0 0 0  Trouble concentrating 0 0 0 0 0  Moving slowly or fidgety/restless 0 0 0 0 0  Suicidal thoughts - 0 0 0 0  PHQ-9 Score 3 7 2 2 3   Difficult doing work/chores Not difficult at all - Not difficult at all - Not difficult at all    Details           Interpretation of Total Score  Total Score Depression Severity:  1-4 = Minimal depression, 5-9 = Mild depression, 10-14 = Moderate depression, 15-19 = Moderately severe depression, 20-27 = Severe depression   Psychosocial Evaluation and Intervention:  Psychosocial Evaluation - 07/26/22 1401       Psychosocial Evaluation &  Interventions   Interventions Encouraged to exercise with the program and follow exercise prescription;Stress management education;Relaxation education    Comments [name] is coming to Pulmonary Rehab with dypsnea and mild COPD. She has noticed that her breathing and stamina have gotten a lot worse in the last year, where it is hard for her to walk even short distances where before she could walk with no problem. She had her thyroid surgery years ago and states she has gained a lot of weight after that she can't seem to shake. She is working really hard on quitting smoking entirely. She tried the patches and had to stop because of their side effects. She has started changing her lifestyle habits, like drinking more water or going outside on her porch instead of smoking. She is down to very sporadically smoking. She mentioned that some triggers are her car (since is smells like smoke), when she is on the phone a lot during her work calls, and when her what used to be very big temper flares up. As far as her temper, she notes that she has made a lot of positive changes over the years and she doesn't let as many things bother her as she used to. She really likes her job at [employer] where she works from home most days. However, she notes that she sits a a lot more than she used to. She is looking forward to becoming more active, but hesitant on what she can actually do given her worsening health concerns.    Expected Outcomes Short: attend pulmonary rehab for education and exercise. Long: develop and maintain positive self care habits.    Continue Psychosocial Services  Follow up required by staff             Psychosocial Re-Evaluation:  Psychosocial Re-Evaluation     Row Name 09/12/22 1750 10/11/22 1747 11/19/22 1752         Psychosocial Re-Evaluation   Current issues with Current Stress Concerns Current Stress Concerns Current Stress Concerns     Comments no new mental health concerns but  does continue to have stress related to current living situation and her daughter's health concerns. [name] is living with a friend she has known for 40 years. Her main stressor is her room mate that is an alcoholic. She likes her job and what she does but states that there is some drama that is bothering her at work. Patient  reports no issues with their current mental states, sleep, stress, depression or anxiety. Will follow up with patient in a few weeks for any changes.     Expected Outcomes Short: continue to work to a more stable living situation. Long: Continue to maintain good mental health habits. Short: Attend [facility] stress management education to decrease stress. Long: Maintain exercise Post [facility] to keep stress at a minimum. Short: Continue to exercise regularly to support mental health and notify staff of any changes. Long: maintain mental health and well being through teaching of rehab or prescribed medications independently.     Interventions Encouraged to attend Pulmonary Rehabilitation for the exercise Encouraged to attend Pulmonary Rehabilitation for the exercise Encouraged to attend Pulmonary Rehabilitation for the exercise     Continue Psychosocial Services  Follow up required by staff Follow up required by staff Follow up required by staff              Psychosocial Discharge (Final Psychosocial Re-Evaluation):  Psychosocial Re-Evaluation - 11/19/22 1752       Psychosocial Re-Evaluation   Current issues with Current Stress Concerns    Comments Patient reports no issues with their current mental states, sleep, stress, depression or anxiety. Will follow up with patient in a few weeks for any changes.    Expected Outcomes Short: Continue to exercise regularly to support mental health and notify staff of any changes. Long: maintain mental health and well being through teaching of rehab or prescribed medications independently.    Interventions Encouraged to attend Pulmonary  Rehabilitation for the exercise    Continue Psychosocial Services  Follow up required by staff             Education: Education Goals: Education classes will be provided on a weekly basis, covering required topics. Participant will state understanding/return demonstration of topics presented.  Learning Barriers/Preferences:  Learning Barriers/Preferences - 07/26/22 1342       Learning Barriers/Preferences   Learning Barriers None    Learning Preferences None             General Pulmonary Education Topics:  Infection Prevention: - Provides verbal and written material to individual with discussion of infection control including proper hand washing and proper equipment cleaning during exercise session. Flowsheet Row Pulmonary Rehab from 09/12/2022 in [facility] Cardiac and Pulmonary Rehab  Date 08/06/22  Educator NT  Instruction Review Code 1- Verbalizes Understanding       Falls Prevention: - Provides verbal and written material to individual with discussion of falls prevention and safety. Flowsheet Row Pulmonary Rehab from 09/12/2022 in [facility] Cardiac and Pulmonary Rehab  Date 08/06/22  Educator NT  Instruction Review Code 1- Verbalizes Understanding       Chronic Lung Disease Review: - Group verbal instruction with posters, models, PowerPoint presentations and videos,  to review new updates, new respiratory medications, new advancements in procedures and treatments. Providing information on websites and "800" numbers for continued self-education. Includes information about supplement oxygen, available portable oxygen systems, continuous and intermittent flow rates, oxygen safety, concentrators, and Medicare reimbursement for oxygen. Explanation of Pulmonary Drugs, including class, frequency, complications, importance of spacers, rinsing mouth after steroid MDI's, and proper cleaning methods for nebulizers. Review of basic lung anatomy and physiology related to function,  structure, and complications of lung disease. Review of risk factors. Discussion about methods for diagnosing sleep apnea and types of masks and machines for OSA. Includes a review of the use of types of environmental controls: home humidity, furnaces, filters,  dust mite/pet prevention, HEPA vacuums. Discussion about weather changes, air quality and the benefits of nasal washing. Instruction on Warning signs, infection symptoms, calling MD promptly, preventive modes, and value of vaccinations. Review of effective airway clearance, coughing and/or vibration techniques. Emphasizing that all should Create an Action Plan. Written material given at graduation.   AED/CPR: - Group verbal and written instruction with the use of models to demonstrate the basic use of the AED with the basic ABC's of resuscitation.    Anatomy and Cardiac Procedures: - Group verbal and visual presentation and models provide information about basic cardiac anatomy and function. Reviews the testing methods done to diagnose heart disease and the outcomes of the test results. Describes the treatment choices: Medical Management, Angioplasty, or Coronary Bypass Surgery for treating various heart conditions including Myocardial Infarction, Angina, Valve Disease, and Cardiac Arrhythmias.  Written material given at graduation.   Medication Safety: - Group verbal and visual instruction to review commonly prescribed medications for heart and lung disease. Reviews the medication, class of the drug, and side effects. Includes the steps to properly store meds and maintain the prescription regimen.  Written material given at graduation.   Other: -Provides group and verbal instruction on various topics (see comments)   Knowledge Questionnaire Score:  Knowledge Questionnaire Score - 12/20/22 1718       Knowledge Questionnaire Score   Post Score 17/18              Core Components/Risk Factors/Patient Goals at Admission:   Personal Goals and Risk Factors at Admission - 08/06/22 1035       Core Components/Risk Factors/Patient Goals on Admission    Weight Management Yes;Weight Gain    Intervention Weight Management: Develop a combined nutrition and exercise program designed to reach desired caloric intake, while maintaining appropriate intake of nutrient and fiber, sodium and fats, and appropriate energy expenditure required for the weight goal.;Weight Management: Provide education and appropriate resources to help participant work on and attain dietary goals.;Weight Management/Obesity: Establish reasonable short term and long term weight goals.    Admit Weight 215 lb 9.6 oz (97.8 kg)    Goal Weight: Short Term 205 lb (93 kg)    Goal Weight: Long Term 175 lb (79.4 kg)    Expected Outcomes Short Term: Continue to assess and modify interventions until short term weight is achieved;Long Term: Adherence to nutrition and physical activity/exercise program aimed toward attainment of established weight goal;Weight Loss: Understanding of general recommendations for a balanced deficit meal plan, which promotes 1-2 lb weight loss per week and includes a negative energy balance of [phone] kcal/d;Understanding recommendations for meals to include 15-35% energy as protein, 25-35% energy from fat, 35-60% energy from carbohydrates, less than 200mg  of dietary cholesterol, 20-35 gm of total fiber daily;Understanding of distribution of calorie intake throughout the day with the consumption of 4-5 meals/snacks    Tobacco Cessation Yes    Number of packs per day --   inconsistent   Intervention Assist the participant in steps to quit. Provide individualized education and counseling about committing to Tobacco Cessation, relapse prevention, and pharmacological support that can be provided by physician.;Education officer, environmental, assist with locating and accessing local/national Quit Smoking programs, and support quit date choice.     Expected Outcomes Short Term: Will demonstrate readiness to quit, by selecting a quit date.;Short Term: Will quit all tobacco product use, adhering to prevention of relapse plan.;Long Term: Complete abstinence from all tobacco products for at least 12 months from  quit date.    Improve shortness of breath with ADL's Yes    Intervention Provide education, individualized exercise plan and daily activity instruction to help decrease symptoms of SOB with activities of daily living.    Expected Outcomes Short Term: Improve cardiorespiratory fitness to achieve a reduction of symptoms when performing ADLs;Long Term: Be able to perform more ADLs without symptoms or delay the onset of symptoms    Hypertension Yes    Intervention Provide education on lifestyle modifcations including regular physical activity/exercise, weight management, moderate sodium restriction and increased consumption of fresh fruit, vegetables, and low fat dairy, alcohol moderation, and smoking cessation.;Monitor prescription use compliance.    Expected Outcomes Short Term: Continued assessment and intervention until BP is < 140/87mm HG in hypertensive participants. < 130/63mm HG in hypertensive participants with diabetes, heart failure or chronic kidney disease.;Long Term: Maintenance of blood pressure at goal levels.    Lipids Yes    Intervention Provide education and support for participant on nutrition & aerobic/resistive exercise along with prescribed medications to achieve LDL 70mg , HDL >40mg .    Expected Outcomes Short Term: Participant states understanding of desired cholesterol values and is compliant with medications prescribed. Participant is following exercise prescription and nutrition guidelines.;Long Term: Cholesterol controlled with medications as prescribed, with individualized exercise RX and with personalized nutrition plan. Value goals: LDL < 70mg , HDL > 40 mg.             Education:Diabetes - Individual verbal and  written instruction to review signs/symptoms of diabetes, desired ranges of glucose level fasting, after meals and with exercise. Acknowledge that pre and post exercise glucose checks will be done for 3 sessions at entry of program.   Know Your Numbers and Heart Failure: - Group verbal and visual instruction to discuss disease risk factors for cardiac and pulmonary disease and treatment options.  Reviews associated critical values for Overweight/Obesity, Hypertension, Cholesterol, and Diabetes.  Discusses basics of heart failure: signs/symptoms and treatments.  Introduces Heart Failure Zone chart for action plan for heart failure.  Written material given at graduation. Flowsheet Row Pulmonary Rehab from 09/12/2022 in [facility] Cardiac and Pulmonary Rehab  Education need identified 08/06/22  Date 09/12/22  Educator MS  Instruction Review Code 1- Verbalizes Understanding       Core Components/Risk Factors/Patient Goals Review:   Goals and Risk Factor Review     Row Name 09/12/22 1745 10/11/22 1741 11/19/22 1757         Core Components/Risk Factors/Patient Goals Review   Personal Goals Review Weight Management/Obesity;Tobacco Cessation Improve shortness of breath with ADL's Weight Management/Obesity     Review Patient reports that her weight has gone up. She is continually dealing with fluid retention. She does not have a scale at home and she was encouraged to buy one to monitor weight. She continues to smoke 2-3 cigarettes a day and does not currently have a quit date. she does take all medications as prescribed. Spoke to patient about their shortness of breath and what they can do to improve. Patient has been informed of breathing techniques when starting the program. Patient is informed to tell staff if they have had any med changes and that certain meds they are taking or not taking can be causing shortness of breath. Informed patient that they can take rest breaks when they get short of breath,  regain breath and resume exercise. [name] wants to try to lose weight and reach a weight goal of 150 pounds. She is doing well  in rehab and has lost 4 pounds. Her blood pressure has been good and she has no issues with managing it.     Expected Outcomes Short: buy and scale and begin to monitor weight at home on a daily basis. Long: work on quitting smoking. Short: Attend [facility] regularly to improve shortness of breath with ADL's. Long: maintain independence with ADL's Short: lose 5 pounds in the next few weeks. Long: reach weight goal.              Core Components/Risk Factors/Patient Goals at Discharge (Final Review):   Goals and Risk Factor Review - 11/19/22 1757       Core Components/Risk Factors/Patient Goals Review   Personal Goals Review Weight Management/Obesity    Review [name] wants to try to lose weight and reach a weight goal of 150 pounds. She is doing well in rehab and has lost 4 pounds. Her blood pressure has been good and she has no issues with managing it.    Expected Outcomes Short: lose 5 pounds in the next few weeks. Long: reach weight goal.             ITP Comments:  ITP Comments     Row Name 07/26/22 1350 08/06/22 1028 08/13/22 1721 08/22/22 1402 09/19/22 0939   ITP Comments Initial phone call completed. Diagnosis can be found in [facility] 12/1. EP Orientation scheduled for Monday 4/1 at 9am.       Franchesca is a current tobacco user. Intervention for tobacco cessation was provided at the initial medical review. She was asked about readiness to quit and reported she is ready to quit . Patient was advised and educated about tobacco cessation using combination therapy, tobacco cessation classes, quit line, and quit smoking apps. Patient demonstrated understanding of this material. Staff will continue to provide encouragement and follow up with the patient throughout the program. Completed and gym orientation. Initial ITP created and sent for review to Dr. Vida Rigger, Medical Director. First full day of exercise!  Patient was oriented to gym and equipment including functions, settings, policies, and procedures.  Patient's individual exercise prescription and treatment plan were reviewed.  All starting workloads were established based on the results of the 6 minute walk test done at initial orientation visit.  The plan for exercise progression was also introduced and progression will be customized based on patient's performance and goals. 30 day review completed. ITP sent to Dr. Jinny Sanders, Medical Director of  Pulmonary Rehab. Continue with ITP unless changes are made by physician. Pt still new to program. 30 Day review completed. Medical Director ITP review done, changes made as directed, and signed approval by Medical Director.    Row Name 10/17/22 1130 11/13/22 1503 12/12/22 1203 12/20/22 1714     ITP Comments 30 Day review completed. Medical Director ITP review done, changes made as directed, and signed approval by Medical Director. 30 Day review completed. Medical Director ITP review done, changes made as directed, and signed approval by Medical Director. 30 Day review completed. Medical Director ITP review done, changes made as directed, and signed approval by Medical Director. [name] graduated today from  rehab with 36 sessions completed.  Details of the patient's exercise prescription and what She needs to do in order to continue the prescription and progress were discussed with patient.  Patient was given a copy of prescription and goals.  Patient verbalized understanding. [name] plans to continue to exercise by walking.  Comments: Discharge ITP

## 2022-12-20 NOTE — Progress Notes (Signed)
Daily Session Note  Patient Details  Name: [name] MRN: [id number] Date of Birth: April 08, 1956 Referring Provider:   Flowsheet Row Pulmonary Rehab from 08/06/2022 in [facility] Cardiac and Pulmonary Rehab  Referring Provider Dr. Sarina Ser, MD       Encounter Date: 12/20/2022  Check In:  Session Check In - 12/20/22 1712       Check-In   Supervising physician immediately available to respond to emergencies See telemetry face sheet for immediately available ER MD    Location [name]    Staff Present [name], RN BSN;Margaret Best, MS, Exercise Physiologist;Maxon Conetta BS, , Exercise Physiologist    Virtual Visit No    Medication changes reported     No    Fall or balance concerns reported    No    Tobacco Cessation No Change    Current number of cigarettes/nicotine per day     2    Warm-up and Cool-down Performed on first and last piece of equipment    Resistance Training Performed Yes    VAD Patient? No    PAD/SET Patient? No      Pain Assessment   Currently in Pain? No/denies                Social History   Tobacco Use  Smoking Status Former   Current packs/day: 1.50   Average packs/day: 1.5 packs/day for 39.0 years (58.5 ttl pk-yrs)   Types: Cigarettes  Smokeless Tobacco Never  Tobacco Comments   Quit smoking cigarettes 10/20/2022 khj    Goals Met:  Independence with exercise equipment Exercise tolerated well No report of concerns or symptoms today Strength training completed today  Goals Unmet:  Not Applicable  Comments:  [name] graduated today from  rehab with 36 sessions completed.  Details of the patient's exercise prescription and what She needs to do in order to continue the prescription and progress were discussed with patient.  Patient was given a copy of prescription and goals.  Patient verbalized understanding. [name] plans to continue to exercise by walking.     Dr. Bethann Punches is Medical Director for  [facility] Cardiac Rehabilitation.  Dr. Vida Rigger is Medical Director for [facility] Pulmonary Rehabilitation.

## 2023-01-30 ENCOUNTER — Ambulatory Visit: Payer: BC Managed Care – PPO | Admitting: Pulmonary Disease

## 2023-02-08 ENCOUNTER — Other Ambulatory Visit: Payer: Self-pay | Admitting: Family Medicine

## 2023-02-08 DIAGNOSIS — Z1211 Encounter for screening for malignant neoplasm of colon: Secondary | ICD-10-CM

## 2023-02-08 DIAGNOSIS — Z1212 Encounter for screening for malignant neoplasm of rectum: Secondary | ICD-10-CM

## 2023-02-28 ENCOUNTER — Telehealth: Payer: BC Managed Care – PPO | Admitting: Family Medicine

## 2023-02-28 ENCOUNTER — Encounter: Payer: Self-pay | Admitting: Family Medicine

## 2023-02-28 DIAGNOSIS — Z720 Tobacco use: Secondary | ICD-10-CM

## 2023-02-28 NOTE — Progress Notes (Signed)
There were no vitals taken for this visit.   Subjective:    [name], female    DOB: Sep 19, 1955, 67 y.o.   MRN: [id number]  HPI: [name] is a 67 y.o. female  Chief Complaint  Patient presents with   Smoking Cessation Screening    Patient says she is vaping, but want to discuss to stop smoking.    SMOKING CESSATION Smoking Status: some day smoker Smoking Amount: 3x a day Smoking Onset: 67yo, quit for 18 years and then picked it back up Smoking Quit Date: not set Smoking triggers: stress, socially Type of tobacco use: vaping  Children in the house: yes Other household members who smoke: no Treatments attempted: cold [place], nicotine patch Pneumovax: up to date  Relevant past medical, surgical, family and social history reviewed and updated as indicated. Interim medical history since our last visit reviewed. Allergies and medications reviewed and updated.  Review of Systems  Constitutional: Negative.   Respiratory: Negative.    Cardiovascular: Negative.   Gastrointestinal: Negative.   Musculoskeletal: Negative.   Skin: Negative.   Psychiatric/Behavioral: Negative.      Per HPI unless specifically indicated above     Objective:    There were no vitals taken for this visit.  Wt Readings from Last 3 Encounters:  12/06/22 214 lb (97.1 kg)  12/06/22 214 lb (97.1 kg)  10/25/22 214 lb (97.1 kg)    Physical Exam Vitals and nursing note reviewed.  Constitutional:      General: She is not in acute distress.    Appearance: Normal appearance. She is not ill-appearing, toxic-appearing or diaphoretic.  HENT:     Head: Normocephalic and atraumatic.     Right Ear: External ear normal.     Left Ear: External ear normal.     Nose: Nose normal.     Mouth/Throat:     Mouth: Mucous membranes are moist.     Pharynx: Oropharynx is clear.  Eyes:     General: No scleral icterus.       Right eye: No discharge.        Left eye: No discharge.      Conjunctiva/sclera: Conjunctivae normal.     Pupils: Pupils are equal, round, and reactive to light.  Pulmonary:     Effort: Pulmonary effort is normal. No respiratory distress.     Comments: Speaking in full sentences Musculoskeletal:        General: Normal range of motion.     Cervical back: Normal range of motion.  Skin:    Coloration: Skin is not jaundiced or pale.     Findings: No bruising, erythema, lesion or rash.  Neurological:     Mental Status: She is alert and oriented to person, place, and time. Mental status is at baseline.  Psychiatric:        Mood and Affect: Mood normal.        Behavior: Behavior normal.        Thought Content: Thought content normal.        Judgment: Judgment normal.     Results for orders placed or performed in visit on 09/24/22  CBC with Differential/Platelet  Result Value Ref Range   WBC 8.3 3.4 - 10.8 x10E3/uL   RBC 4.81 3.77 - 5.28 x10E6/uL   Hemoglobin 14.9 11.1 - 15.9 g/dL   Hematocrit 11.9 14.7 - 46.6 %   MCV 94 79 - 97 fL   MCH 31.0 26.6 - 33.0 pg  MCHC 32.9 31.5 - 35.7 g/dL   RDW 21.3 08.6 - 57.8 %   Platelets 252 150 - 450 x10E3/uL   Neutrophils 64 Not Estab. %   Lymphs 27 Not Estab. %   Monocytes 7 Not Estab. %   Eos 1 Not Estab. %   Basos 1 Not Estab. %   Neutrophils Absolute 5.4 1.4 - 7.0 x10E3/uL   Lymphocytes Absolute 2.2 0.7 - 3.1 x10E3/uL   Monocytes Absolute 0.6 0.1 - 0.9 x10E3/uL   EOS (ABSOLUTE) 0.1 0.0 - 0.4 x10E3/uL   Basophils Absolute 0.0 0.0 - 0.2 x10E3/uL   Immature Granulocytes 0 Not Estab. %   Immature Grans (Abs) 0.0 0.0 - 0.1 x10E3/uL  Comprehensive metabolic panel  Result Value Ref Range   Glucose 107 (H) 70 - 99 mg/dL   BUN 10 8 - 27 mg/dL   Creatinine, Ser 4.69 (H) 0.57 - 1.00 mg/dL   eGFR 61 >62 XB/MWU/1.32   BUN/Creatinine Ratio 10 (L) 12 - 28   Sodium 142 134 - 144 mmol/L   Potassium 4.3 3.5 - 5.2 mmol/L   Chloride 103 96 - 106 mmol/L   CO2 24 20 - 29 mmol/L   Calcium 9.1 8.7 - 10.3 mg/dL    Total Protein 6.6 6.0 - 8.5 g/dL   Albumin 4.2 3.9 - 4.9 g/dL   Globulin, Total 2.4 1.5 - 4.5 g/dL   Albumin/Globulin Ratio 1.8 1.2 - 2.2   Bilirubin Total 0.3 0.0 - 1.2 mg/dL   Alkaline Phosphatase 95 44 - 121 IU/L   AST 17 0 - 40 IU/L   ALT 12 0 - 32 IU/L  Lipid Panel w/o Chol/HDL Ratio  Result Value Ref Range   Cholesterol, Total 236 (H) 100 - 199 mg/dL   Triglycerides 440 0 - 149 mg/dL   HDL 45 >10 mg/dL   VLDL Cholesterol Cal 27 5 - 40 mg/dL   LDL Chol Calc (NIH) 272 (H) 0 - 99 mg/dL  TSH  Result Value Ref Range   TSH 4.450 0.450 - 4.500 uIU/mL      Assessment & Plan:   Problem List Items Addressed This Visit       Other   Tobacco abuse - Primary    Will change from vaping with nicotine to vaping without nicotine. Working on cutting back. Continue to monitor. Call with any concerns.         Follow up plan: Return if symptoms worsen or fail to improve.    This visit was completed via video visit through MyChart due to the restrictions of the COVID-19 pandemic. All issues as above were discussed and addressed. Physical exam was done as above through visual confirmation on video through MyChart. If it was felt that the patient should be evaluated in the office, they were directed there. The patient verbally consented to this visit. Location of the patient: parking lot Location of the provider: work Those involved with this call:  Provider: Olevia Perches, DO CMA: Malen Gauze, CMA Front Desk/Registration:  Servando Snare   Time spent on call:  15 minutes with patient face to face via video conference. More than 50% of this time was spent in counseling and coordination of care. 23 minutes total spent in review of patient's record and preparation of their chart.

## 2023-02-28 NOTE — Assessment & Plan Note (Signed)
Will change from vaping with nicotine to vaping without nicotine. Working on cutting back. Continue to monitor. Call with any concerns.

## 2023-03-05 ENCOUNTER — Telehealth: Payer: Self-pay | Admitting: Pulmonary Disease

## 2023-03-05 DIAGNOSIS — G4734 Idiopathic sleep related nonobstructive alveolar hypoventilation: Secondary | ICD-10-CM

## 2023-03-05 NOTE — Telephone Encounter (Signed)
Order can be sent but would have to be AGAINST MEDICAL ADVICE.

## 2023-03-05 NOTE — Telephone Encounter (Signed)
Lm x1 for the patient.

## 2023-03-05 NOTE — Telephone Encounter (Signed)
Pt calling in requesting a discontinue order be sent to [facility] for Oxygen pick up

## 2023-03-06 NOTE — Telephone Encounter (Signed)
I spoke with the patient. She said she has purchased her own oxygen concentrator and would like the one [facility] provided pick up.   I have placed the order.  Nothing further needed.

## 2023-03-28 ENCOUNTER — Telehealth: Payer: Self-pay

## 2023-03-28 ENCOUNTER — Ambulatory Visit: Payer: BC Managed Care – PPO | Admitting: Family Medicine

## 2023-03-28 ENCOUNTER — Encounter: Payer: Self-pay | Admitting: Family Medicine

## 2023-03-28 VITALS — BP 122/79 | HR 58 | Temp 97.6°F | Resp 16 | Ht 64.76 in | Wt 215.8 lb

## 2023-03-28 DIAGNOSIS — F411 Generalized anxiety disorder: Secondary | ICD-10-CM

## 2023-03-28 DIAGNOSIS — E782 Mixed hyperlipidemia: Secondary | ICD-10-CM

## 2023-03-28 DIAGNOSIS — E039 Hypothyroidism, unspecified: Secondary | ICD-10-CM | POA: Diagnosis not present

## 2023-03-28 DIAGNOSIS — Z0000 Encounter for general adult medical examination without abnormal findings: Secondary | ICD-10-CM

## 2023-03-28 DIAGNOSIS — I480 Paroxysmal atrial fibrillation: Secondary | ICD-10-CM | POA: Diagnosis not present

## 2023-03-28 DIAGNOSIS — Z1231 Encounter for screening mammogram for malignant neoplasm of breast: Secondary | ICD-10-CM

## 2023-03-28 DIAGNOSIS — E785 Hyperlipidemia, unspecified: Secondary | ICD-10-CM | POA: Diagnosis not present

## 2023-03-28 DIAGNOSIS — F331 Major depressive disorder, recurrent, moderate: Secondary | ICD-10-CM

## 2023-03-28 DIAGNOSIS — Z780 Asymptomatic menopausal state: Secondary | ICD-10-CM

## 2023-03-28 LAB — MICROALBUMIN, URINE WAIVED
Creatinine, Urine Waived: 200 mg/dL (ref 10–300)
Microalb, Ur Waived: 80 mg/L — ABNORMAL HIGH (ref 0–19)
Microalb/Creat Ratio: <30 mg/g (ref <30)

## 2023-03-28 MED ORDER — ALBUTEROL SULFATE HFA 108 (90 BASE) MCG/ACT IN AERS: 2.0000 | INHALATION_SPRAY | Freq: Four times a day (QID) | RESPIRATORY_TRACT | 2 refills | Status: DC | PRN

## 2023-03-28 MED ORDER — CITALOPRAM HYDROBROMIDE 40 MG PO TABS: 60.0000 mg | ORAL_TABLET | Freq: Every day | ORAL | 1 refills | Status: DC

## 2023-03-28 MED ORDER — ATORVASTATIN CALCIUM 40 MG PO TABS: 40.0000 mg | ORAL_TABLET | Freq: Every day | ORAL | 1 refills | Status: DC

## 2023-03-28 MED ORDER — FUROSEMIDE 20 MG PO TABS: 20.0000 mg | ORAL_TABLET | ORAL | 3 refills | Status: AC

## 2023-03-28 MED ORDER — PANTOPRAZOLE SODIUM 20 MG PO TBEC: 20.0000 mg | DELAYED_RELEASE_TABLET | Freq: Every day | ORAL | 3 refills | Status: DC

## 2023-03-28 NOTE — Patient Instructions (Signed)
Preventative Services:  Health Risk Assessment and Personalized Prevention Plan:Done today Bone Mass Measurements: Ordered today Breast Cancer Screening: Ordered today CVD Screening: Done today Cervical Cancer Screening: N/A Colon Cancer Screening: Ordered today Depression Screening: Done today Diabetes Screening: Done today Glaucoma Screening: See your eye doctor Hepatitis B vaccine: N/A Hepatitis C screening: up to date HIV Screening: up to day Flu Vaccine: Declined Lung cancer Screening: up to date Obesity Screening: done today Pneumonia Vaccines (2): declined. STI Screening: N/A

## 2023-03-28 NOTE — Progress Notes (Signed)
BP 122/79 (BP Location: Left Arm, Patient Position: Sitting, Cuff Size: Large)   Pulse (!) 58   Temp 97.6 F (36.4 C) (Oral)   Resp 16   Ht 5' 4.76" (1.645 m)   Wt 215 lb 12.8 oz (97.9 kg)   SpO2 98%   BMI 36.17 kg/m    Subjective:    [name], female    DOB: 02/10/1956, 67 y.o.   MRN: [id number]  HPI: [name] is a 67 y.o. female presenting on 03/28/2023 for comprehensive medical examination. Current medical complaints include:  HYPERLIPIDEMIA Hyperlipidemia status: excellent compliance Satisfied with current treatment?  yes Side effects:  no Medication compliance: excellent compliance Past cholesterol meds: atorvastatin Supplements: none Aspirin:  no The 10-year ASCVD risk score (Arnett DK, et al., 2019) is: 7.2%   Values used to calculate the score:     Age: 47 years     Sex: Female     Is Non-Hispanic African American: No     Diabetic: No     Tobacco smoker: No     Systolic Blood Pressure: 122 mmHg     Is BP treated: No     HDL Cholesterol: 45 mg/dL     Total Cholesterol: 236 mg/dL Chest pain:  no Coronary artery disease:  no  HYPOTHYROIDISM Thyroid control status:unsure Satisfied with current treatment? no Medication side effects: no Medication compliance: excellent compliance Recent dose adjustment:no Fatigue: yes Cold intolerance: no Heat intolerance: no Weight gain: no Weight loss: no Constipation: no Diarrhea/loose stools: no Palpitations: no Lower extremity edema: no Anxiety/depressed mood: no  DEPRESSION Mood status: controlled Satisfied with current treatment?: yes Symptom severity: mild  Duration of current treatment : chronic Side effects: no Medication compliance: excellent compliance Psychotherapy/counseling: no  Previous psychiatric medications: celexa Depressed mood: no Anxious mood: no Anhedonia: no Significant weight loss or gain: no Insomnia: no  Fatigue: yes Feelings of worthlessness or guilt:  no Impaired concentration/indecisiveness: no Suicidal ideations: no Hopelessness: no Crying spells: no    12/20/2022    5:20 PM 09/24/2022   10:19 AM 08/06/2022   10:29 AM 03/26/2022   10:19 AM 02/01/2022    8:44 AM  Depression screen PHQ 2/9  Decreased Interest 0 1 0 0 0  Down, Depressed, Hopeless 0 1 0 0 0  PHQ - 2 Score 0 2 0 0 0  Altered sleeping 2 2 1 1 3   Tired, decreased energy 1 2 1 1  0  Change in appetite 0 1 0 0 0  Feeling bad or failure about yourself  0 0 0 0 0  Trouble concentrating 0 0 0 0 0  Moving slowly or fidgety/restless 0 0 0 0 0  Suicidal thoughts  0 0 0 0  PHQ-9 Score 3 7 2 2 3   Difficult doing work/chores Not difficult at all  Not difficult at all  Not difficult at all   Menopausal Symptoms: no  Functional Status Survey: Is the patient deaf or have difficulty hearing?: No Does the patient have difficulty seeing, even when wearing glasses/contacts?: No Does the patient have difficulty concentrating, remembering, or making decisions?: No Does the patient have difficulty walking or climbing stairs?: No Does the patient have difficulty dressing or bathing?: No Does the patient have difficulty doing errands alone such as visiting a doctor's office or shopping?: No     09/24/2022   10:19 AM 07/26/2022    1:36 PM 03/26/2022   10:19 AM 02/01/2022    8:45  AM 01/28/2022    8:44 AM  Fall Risk   Falls in the past year? 0 0 0 0 0  Number falls in past yr: 0 0 0 0   Injury with Fall? 0 0 0 0   Risk for fall due to : No Fall Risks  No Fall Risks No Fall Risks   Follow up Falls evaluation completed  Falls evaluation completed Falls evaluation completed     Depression Screen    12/20/2022    5:20 PM 09/24/2022   10:19 AM 08/06/2022   10:29 AM 03/26/2022   10:19 AM 02/01/2022    8:44 AM  Depression screen PHQ 2/9  Decreased Interest 0 1 0 0 0  Down, Depressed, Hopeless 0 1 0 0 0  PHQ - 2 Score 0 2 0 0 0  Altered sleeping 2 2 1 1 3   Tired, decreased energy 1 2 1  1  0  Change in appetite 0 1 0 0 0  Feeling bad or failure about yourself  0 0 0 0 0  Trouble concentrating 0 0 0 0 0  Moving slowly or fidgety/restless 0 0 0 0 0  Suicidal thoughts  0 0 0 0  PHQ-9 Score 3 7 2 2 3   Difficult doing work/chores Not difficult at all  Not difficult at all  Not difficult at all   Advanced Directives Does patient have a HCPOA?    no If yes, name and contact information:  Does patient have a living will or MOST form?  no  Past Medical History:  Past Medical History:  Diagnosis Date   Anxiety state, unspecified    Back pain    Chest pain    a. 2008 Cath: reportedly nl;  b. 09/2012 Lexi MV: EF 75%, soft tissue attenuation->Low risk.   COPD (chronic obstructive pulmonary disease) (HCC)    CTS (carpal tunnel syndrome)    Depression    GERD (gastroesophageal reflux disease)    Migraines    Neck pain    Nontoxic multinodular goiter    Other and unspecified hyperlipidemia    Other and unspecified ovarian cyst    left   Other chest pain    PAF (paroxysmal atrial fibrillation) (HCC)    a. 07/2007 Echo: EF 65%, mildly dil LA;  b. currently on propafenone;  c. CHA2DS2VASc = 1 (gender)-->No anticoagulation.   PTSD (post-traumatic stress disorder)    Tobacco use    Tobacco use disorder    Unspecified hypothyroidism    Unspecified sleep apnea    resolved s/p UPPP surgery   Vitamin B12 deficiency     Surgical History:  Past Surgical History:  Procedure Laterality Date   bladder tac     CARDIAC CATHETERIZATION  08/12/06   neg. Dr. Welton Flakes, cardio   CHOLECYSTECTOMY     FOOT SURGERY     bone removal from her foot   MRI lumber spine     03/12/06-abn   MRI pelvis  05/02/06   abn-[facility], neurosurg   NASAL POLYP SURGERY     nasal ?   PARATHYROIDECTOMY     stress cardiolite  10/06/96   nl, EF 62%   stress myoview  10/25/03   Dr. Park Breed, nl   THYROIDECTOMY  5/92   massive goiter   TONSILLECTOMY     TUBAL LIGATION  1988   UVULOPALATOPHARYNGOPLASTY     and  tonsilectomy. Gertie Baron 10/04    Medications:  Current Outpatient Medications on File Prior to Visit  Medication Sig   levothyroxine (SYNTHROID) 125 MCG tablet Take 1 tablet (125 mcg total) by mouth daily with breakfast.   OXYGEN Inhale into the lungs.   propafenone (RYTHMOL) 225 MG tablet TAKE 1 TABLET BY MOUTH DAILY AS NEEDED FOR AFIB   Spacer/Aero-Holding Chambers DEVI 1 Device by Does not apply route as needed.   Tiotropium Bromide-Olodaterol (STIOLTO RESPIMAT) 2.5-2.5 MCG/ACT AERS Inhale 2 puffs into the lungs daily.   Current Facility-Administered Medications on File Prior to Visit  Medication   albuterol (PROVENTIL) (2.5 MG/3ML) 0.083% nebulizer solution 2.5 mg    Allergies:  Allergies  Allergen Reactions   Bee Venom Anaphylaxis   Penicillins Rash   Sulfonamide Derivatives Rash   Cardizem  [Diltiazem Hcl] Other (See Comments)    Other Reaction: severe hypotension w/ IV dose   Sulfa Antibiotics Hives   Tape Rash    Uncoded Allergy. Allergen: tape & bandaids    Social History:  Social History   Socioeconomic History   Marital status: Widowed    Spouse name: Not on file   Number of children: Not on file   Years of education: Not on file   Highest education level: Not on file  Occupational History   Not on file  Tobacco Use   Smoking status: Former    Current packs/day: 1.50    Average packs/day: 1.5 packs/day for 39.0 years (58.5 ttl pk-yrs)    Types: Cigarettes   Smokeless tobacco: Never   Tobacco comments:    Quit smoking cigarettes 10/20/2022 khj  Vaping Use   Vaping status: Some Days   Substances: Nicotine  Substance and Sexual Activity   Alcohol use: Yes    Alcohol/week: 0.0 standard drinks of alcohol    Comment: Rare   Drug use: No   Sexual activity: Not Currently    Birth control/protection: Post-menopausal  Other Topics Concern   Not on file  Social History Narrative   Widowed. 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    Social  Determinants of Health   Financial Resource Strain: Low Risk  (03/28/2023)   Overall Financial Resource Strain (CARDIA)    Difficulty of Paying Living Expenses: Not hard at all  Food Insecurity: No Food Insecurity (03/28/2023)   Hunger Vital Sign    Worried About Running Out of Food in the Last Year: Never true    Ran Out of Food in the Last Year: Never true  Transportation Needs: No Transportation Needs (03/28/2023)   PRAPARE - Administrator, Civil Service (Medical): No    Lack of Transportation (Non-Medical): No  Physical Activity: Sufficiently Active (03/28/2023)   Exercise Vital Sign    Days of Exercise per Week: 4 days    Minutes of Exercise per Session: 150+ min  Stress: No Stress Concern Present (03/28/2023)   [employer] of Occupational Health - Occupational Stress Questionnaire    Feeling of Stress : Only a little  Social Connections: Moderately Isolated (03/28/2023)   Social Connection and Isolation Panel [NHANES]    Frequency of Communication with Friends and Family: Once a week    Frequency of Social Gatherings with Friends and Family: More than three times a week    Attends Religious Services: More than 4 times per year    Active Member of [employer] or Organizations: No    Attends Banker Meetings: Never    Marital Status: Widowed  Intimate Partner Violence: Not At Risk (03/28/2023)   Humiliation, Afraid, Rape, and  Kick questionnaire    Fear of Current or Ex-Partner: No    Emotionally Abused: No    Physically Abused: No    Sexually Abused: No   Social History   Tobacco Use  Smoking Status Former   Current packs/day: 1.50   Average packs/day: 1.5 packs/day for 39.0 years (58.5 ttl pk-yrs)   Types: Cigarettes  Smokeless Tobacco Never  Tobacco Comments   Quit smoking cigarettes 10/20/2022 khj   Social History   Substance and Sexual Activity  Alcohol Use Yes   Alcohol/week: 0.0 standard drinks of alcohol   Comment: Rare     Family History:  Family History  Problem Relation Age of Onset   Heart disease Mother    Atrial fibrillation Mother    Congestive Heart Failure Mother    Stroke Father    Atrial fibrillation Sister        s/p ablation   Breast cancer Sister 32   Hyperlipidemia Daughter    Heart disease Daughter        leaky valve   Atrial fibrillation Other    Heart attack Other        both sides   Stroke Other        both sides   Depression Other        both sides   Prostate cancer Neg Hx    Colon cancer Neg Hx     Past medical history, surgical history, medications, allergies, family history and social history reviewed with patient today and changes made to appropriate areas of the chart.   Review of Systems  Constitutional: Negative.   HENT: Negative.    Eyes: Negative.   Respiratory: Negative.    Cardiovascular: Negative.   Gastrointestinal:  Positive for heartburn. Negative for abdominal pain, blood in stool, constipation, diarrhea, melena, nausea and vomiting.  Genitourinary: Negative.   Musculoskeletal: Negative.   Skin: Negative.   Neurological: Negative.   Endo/Heme/Allergies: Negative.   Psychiatric/Behavioral: Negative.      All other ROS negative except what is listed above and in the HPI.      Objective:    BP 122/79 (BP Location: Left Arm, Patient Position: Sitting, Cuff Size: Large)   Pulse (!) 58   Temp 97.6 F (36.4 C) (Oral)   Resp 16   Ht 5' 4.76" (1.645 m)   Wt 215 lb 12.8 oz (97.9 kg)   SpO2 98%   BMI 36.17 kg/m   Wt Readings from Last 3 Encounters:  03/28/23 215 lb 12.8 oz (97.9 kg)  12/06/22 214 lb (97.1 kg)  12/06/22 214 lb (97.1 kg)   Physical Exam Vitals and nursing note reviewed.  Constitutional:      General: She is not in acute distress.    Appearance: Normal appearance. She is not ill-appearing, toxic-appearing or diaphoretic.  HENT:     Head: Normocephalic and atraumatic.     Right Ear: Tympanic membrane, ear canal and external  ear normal. There is no impacted cerumen.     Left Ear: Tympanic membrane, ear canal and external ear normal. There is no impacted cerumen.     Nose: Nose normal. No congestion or rhinorrhea.     Mouth/Throat:     Mouth: Mucous membranes are moist.     Pharynx: Oropharynx is clear. No oropharyngeal exudate or posterior oropharyngeal erythema.  Eyes:     General: No scleral icterus.       Right eye: No discharge.        Left eye:  No discharge.     Extraocular Movements: Extraocular movements intact.     Conjunctiva/sclera: Conjunctivae normal.     Pupils: Pupils are equal, round, and reactive to light.  Neck:     Vascular: No carotid bruit.  Cardiovascular:     Rate and Rhythm: Normal rate and regular rhythm.     Pulses: Normal pulses.     Heart sounds: No murmur heard.    No friction rub. No gallop.  Pulmonary:     Effort: Pulmonary effort is normal. No respiratory distress.     Breath sounds: Normal breath sounds. No stridor. No wheezing, rhonchi or rales.  Chest:     Chest wall: No tenderness.  Abdominal:     General: Abdomen is flat. Bowel sounds are normal. There is no distension.     Palpations: Abdomen is soft. There is no mass.     Tenderness: There is no abdominal tenderness. There is no right CVA tenderness, left CVA tenderness, guarding or rebound.     Hernia: No hernia is present.  Genitourinary:    Comments: Breast and pelvic exams deferred with shared decision making Musculoskeletal:        General: No swelling, tenderness, deformity or signs of injury.     Cervical back: Normal range of motion and neck supple. No rigidity. No muscular tenderness.     Right lower leg: No edema.     Left lower leg: No edema.  Lymphadenopathy:     Cervical: No cervical adenopathy.  Skin:    General: Skin is warm and dry.     Capillary Refill: Capillary refill takes less than 2 seconds.     Coloration: Skin is not jaundiced or pale.     Findings: No bruising, erythema, lesion or  rash.  Neurological:     General: No focal deficit present.     Mental Status: She is alert and oriented to person, place, and time. Mental status is at baseline.     Cranial Nerves: No cranial nerve deficit.     Sensory: No sensory deficit.     Motor: No weakness.     Coordination: Coordination normal.     Gait: Gait normal.     Deep Tendon Reflexes: Reflexes normal.  Psychiatric:        Mood and Affect: Mood normal.        Behavior: Behavior normal.        Thought Content: Thought content normal.        Judgment: Judgment normal.        03/28/2023   10:44 AM 02/01/2022    8:47 AM 01/30/2021    1:36 PM  6CIT Screen  What Year? 0 points 0 points 0 points  What month? 0 points 0 points 0 points  What time? 0 points 0 points 0 points  Count back from 20 0 points 0 points 0 points  Months in reverse 0 points 0 points 0 points  Repeat phrase 0 points 0 points 2 points  Total Score 0 points 0 points 2 points    Results for orders placed or performed in visit on 03/28/23  Microalbumin, Urine Waived  Result Value Ref Range   Microalb, Ur Waived 80 (H) 0 - 19 mg/L   Creatinine, Urine Waived 200 10 - 300 mg/dL   Microalb/Creat Ratio <30 <30 mg/g      Assessment & Plan:   Problem List Items Addressed This Visit       Cardiovascular and Mediastinum  Atrial fibrillation (HCC)    Continue to follow with cardiology. NSR today. Continue to monitor. Labs drawn today.       Relevant Medications   atorvastatin (LIPITOR) 40 MG tablet   furosemide (LASIX) 20 MG tablet (Start on 03/29/2023)   Other Relevant Orders   CBC with Differential/Platelet   Comprehensive metabolic panel   Microalbumin, Urine Waived (Completed)     Endocrine   Hypothyroidism    Rechecking labs today. Await results. Treat as needed. Call with any concerns.       Relevant Orders   Comprehensive metabolic panel   TSH     Other   Hyperlipidemia    Under good control on current regimen. Continue  current regimen. Continue to monitor. Call with any concerns. Refills given. Labs drawn today.       Relevant Medications   atorvastatin (LIPITOR) 40 MG tablet   furosemide (LASIX) 20 MG tablet (Start on 03/29/2023)   Other Relevant Orders   Comprehensive metabolic panel   Lipid Panel w/o Chol/HDL Ratio   Anxiety state    Under good control on current regimen. Continue current regimen. Continue to monitor. Call with any concerns. Refills given. Labs drawn today.        Relevant Medications   citalopram (CELEXA) 40 MG tablet   Depression, major, recurrent, moderate (HCC)    Under good control on current regimen. Continue current regimen. Continue to monitor. Call with any concerns. Refills given. Labs drawn today.       Relevant Medications   citalopram (CELEXA) 40 MG tablet   Other Visit Diagnoses     Encounter for Medicare annual wellness exam    -  Primary   Preventative care discussed today.   Routine general medical examination at a health care facility       Vaccines up to date/declined. Screening labs checked today. Pap N/A. Mammo, DEXA and cologuard ordered today. Continue diet and exercise. Call with any concerns   Postmenopausal estrogen deficiency       DEXA ordered today   Relevant Orders   DG Bone Density   Encounter for screening mammogram for malignant neoplasm of breast       Mammogram ordered today   Relevant Orders   MM 3D SCREENING MAMMOGRAM BILATERAL BREAST        Preventative Services:  Health Risk Assessment and Personalized Prevention Plan:Done today Bone Mass Measurements: Ordered today Breast Cancer Screening: Ordered today CVD Screening: Done today Cervical Cancer Screening: N/A Colon Cancer Screening: Ordered today Depression Screening: Done today Diabetes Screening: Done today Glaucoma Screening: See your eye doctor Hepatitis B vaccine: N/A Hepatitis C screening: up to date HIV Screening: up to day Flu Vaccine: Declined Lung cancer  Screening: up to date Obesity Screening: done today Pneumonia Vaccines (2): declined. STI Screening: N/A  Follow up plan: Return in about 6 months (around 09/25/2023).   LABORATORY TESTING:  - Pap smear: not applicable  IMMUNIZATIONS:   - Tdap: Tetanus vaccination status reviewed: last tetanus booster within 10 years. - Influenza: Refused - Pneumovax: Refused - Prevnar: Refused - Zostavax vaccine: Refused  SCREENING: -Mammogram: Ordered today  - Colonoscopy: Ordered today  - Bone Density: Ordered today   PATIENT COUNSELING:   Advised to take 1 mg of folate supplement per day if capable of pregnancy.   Sexuality: Discussed sexually transmitted diseases, partner selection, use of condoms, avoidance of unintended pregnancy  and contraceptive alternatives.   Advised to avoid cigarette smoking.  I discussed with  the patient that most people either abstain from alcohol or drink within safe limits (<=14/week and <=4 drinks/occasion for males, <=7/weeks and <= 3 drinks/occasion for females) and that the risk for alcohol disorders and other health effects rises proportionally with the number of drinks per week and how often a drinker exceeds daily limits.  Discussed cessation/primary prevention of drug use and availability of treatment for abuse.   Diet: Encouraged to adjust caloric intake to maintain  or achieve ideal body weight, to reduce intake of dietary saturated fat and total fat, to limit sodium intake by avoiding high sodium foods and not adding table salt, and to maintain adequate dietary potassium and calcium preferably from fresh fruits, vegetables, and low-fat dairy products.    stressed the importance of regular exercise  Injury prevention: Discussed safety belts, safety helmets, smoke detector, smoking near bedding or upholstery.   Dental health: Discussed importance of regular tooth brushing, flossing, and dental visits.    NEXT PREVENTATIVE PHYSICAL DUE IN 1  YEAR. Return in about 6 months (around 09/25/2023).

## 2023-03-28 NOTE — Telephone Encounter (Signed)
-----   Message from Olevia Perches sent at 03/28/2023 10:42 AM EST ----- Never got her cologuard= can we call to get new box sent out, does NOT have one at home

## 2023-03-28 NOTE — Assessment & Plan Note (Signed)
Under good control on current regimen. Continue current regimen. Continue to monitor. Call with any concerns. Refills given. Labs drawn today.   

## 2023-03-28 NOTE — Telephone Encounter (Signed)
Patient is scheduled for 06/18/23 at 10:20 AM for Screening Mammogram and DEXA scan. Patient will be notified via MyChart.

## 2023-03-28 NOTE — Telephone Encounter (Signed)
-----   Message from [facility] sent at 03/28/2023 10:41 AM EST ----- Please get scheduled for dexa and mammo- no preference on date/time, prefers [place]

## 2023-03-28 NOTE — Assessment & Plan Note (Signed)
Rechecking labs today. Await results. Treat as needed. Call with any concerns.  

## 2023-03-28 NOTE — Telephone Encounter (Signed)
Representative from [facility] says they are willing to send the patient one more testing kit to provider address in patient's chart. Representative says they have sent 3 boxes previously. Patient should receive testing kit within 3-5 business days.

## 2023-03-28 NOTE — Assessment & Plan Note (Signed)
Continue to follow with cardiology. NSR today. Continue to monitor. Labs drawn today.

## 2023-03-29 LAB — CBC WITH DIFFERENTIAL/PLATELET
Basophils Absolute: 0 10*3/uL (ref 0.0–0.2)
Basos: 1 %
EOS (ABSOLUTE): 0.1 10*3/uL (ref 0.0–0.4)
Eos: 1 %
Hematocrit: 42.7 % (ref 34.0–46.6)
Hemoglobin: 14 g/dL (ref 11.1–15.9)
Immature Grans (Abs): 0 10*3/uL (ref 0.0–0.1)
Immature Granulocytes: 0 %
Lymphocytes Absolute: 1.8 10*3/uL (ref 0.7–3.1)
Lymphs: 26 %
MCH: 31.1 pg (ref 26.6–33.0)
MCHC: 32.8 g/dL (ref 31.5–35.7)
MCV: 95 fL (ref 79–97)
Monocytes Absolute: 0.5 10*3/uL (ref 0.1–0.9)
Monocytes: 7 %
Neutrophils Absolute: 4.5 10*3/uL (ref 1.4–7.0)
Neutrophils: 65 %
Platelets: 235 10*3/uL (ref 150–450)
RBC: 4.5 x10E6/uL (ref 3.77–5.28)
RDW: 11.8 % (ref 11.7–15.4)
WBC: 6.9 10*3/uL (ref 3.4–10.8)

## 2023-03-29 LAB — COMPREHENSIVE METABOLIC PANEL
ALT: 14 [IU]/L (ref 0–32)
AST: 18 [IU]/L (ref 0–40)
Albumin: 4.2 g/dL (ref 3.9–4.9)
Alkaline Phosphatase: 88 [IU]/L (ref 44–121)
BUN/Creatinine Ratio: 13 (ref 12–28)
BUN: 12 mg/dL (ref 8–27)
Bilirubin Total: 0.3 mg/dL (ref 0.0–1.2)
CO2: 26 mmol/L (ref 20–29)
Calcium: 9 mg/dL (ref 8.7–10.3)
Chloride: 104 mmol/L (ref 96–106)
Creatinine, Ser: 0.95 mg/dL (ref 0.57–1.00)
Globulin, Total: 2 g/dL (ref 1.5–4.5)
Glucose: 101 mg/dL — ABNORMAL HIGH (ref 70–99)
Potassium: 4.1 mmol/L (ref 3.5–5.2)
Sodium: 143 mmol/L (ref 134–144)
Total Protein: 6.2 g/dL (ref 6.0–8.5)
eGFR: 66 mL/min/{1.73_m2} (ref >59)

## 2023-03-29 LAB — LIPID PANEL W/O CHOL/HDL RATIO
Cholesterol, Total: 159 mg/dL (ref 100–199)
HDL: 44 mg/dL (ref >39)
LDL Chol Calc (NIH): 95 mg/dL (ref 0–99)
Triglycerides: 108 mg/dL (ref 0–149)
VLDL Cholesterol Cal: 20 mg/dL (ref 5–40)

## 2023-03-29 LAB — TSH: TSH: 2.67 u[IU]/mL (ref 0.450–4.500)

## 2023-04-25 ENCOUNTER — Ambulatory Visit: Payer: BC Managed Care – PPO | Admitting: Pulmonary Disease

## 2023-04-25 ENCOUNTER — Encounter: Payer: Self-pay | Admitting: Pulmonary Disease

## 2023-04-25 VITALS — BP 128/80 | HR 83 | Temp 96.9°F | Ht 64.76 in | Wt 212.3 lb

## 2023-04-25 DIAGNOSIS — R0602 Shortness of breath: Secondary | ICD-10-CM

## 2023-04-25 DIAGNOSIS — G4736 Sleep related hypoventilation in conditions classified elsewhere: Secondary | ICD-10-CM

## 2023-04-25 DIAGNOSIS — E669 Obesity, unspecified: Secondary | ICD-10-CM | POA: Diagnosis not present

## 2023-04-25 DIAGNOSIS — J449 Chronic obstructive pulmonary disease, unspecified: Secondary | ICD-10-CM | POA: Diagnosis not present

## 2023-04-25 DIAGNOSIS — Z6835 Body mass index (BMI) 35.0-35.9, adult: Secondary | ICD-10-CM | POA: Diagnosis not present

## 2023-04-25 NOTE — Progress Notes (Signed)
Subjective:    [name], female    DOB: 1955/06/27, 67 y.o.   MRN: [id number]  Patient Care Team: Dorcas Carrow, DO as PCP - General (Family Medicine) Bridgett Larsson, LCSW as Social Worker (Licensed Clinical Social Worker)  Chief Complaint  Patient presents with   Follow-up    DOE. No wheezing or cough.     BACKGROUND/INTERVAL: [name] is a 67 year old recent former smoker with a 40-pack-year history of smoking and a history as noted below who follows for the issue of shortness of breath.  She was last seen here on 06 December 2022.  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  She has nocturnal hypoxemia due to COPD/obesity, she is on 1-1/2 L of oxygen nocturnally.  He has not had any exacerbations since her prior visit.  HPI Discussed the use of AI scribe software for clinical note transcription with the patient, who gave verbal consent to proceed.  History of Present Illness   The patient, with a history of heart disease and mild COPD, presents with concerns about weight gain and shortness of breath. She reports using a home oxygen concentrator at nighttime (her own purchase after being qualified for O2) and experiencing shortness of breath only once or twice, typically associated with overexertion. She also expresses frustration with an inability to lose weight despite efforts in rehabilitation and dietary changes.  The patient is on Rythmol for her heart condition but only takes it as needed, which has been infrequent in the past six months. She also takes Stiolto for her COPD, which she believes is beneficial.  She underwent pulmonary rehab and found this very helpful.  The patient has a history of thyroidectomy and is currently on Synthroid. She expresses concerns about weight gain and a feeling of compression in her torso, despite no significant changes in her clothing size. She also mentions a history of back issues.  The patient has not  used albuterol for an extended period and continues to use oxygen at night. She has declined a flu shot due to a previous adverse reaction resulting in hospitalization.      DATA 12/26/2021 PFTs: FEV1 2.41 L or 95% predicted, FVC 3.27 L or 99% predicted FEV1/FVC 74%, lung volumes low normal.  Postbronchodilator study not done.  Diffusion capacity mildly reduced.  There is curvature of the flow-volume loop consistent with mild obstruction. 12/26/2021 echocardiogram: LVEF 60 to 65%, no regional wall motion abnormalities, grade 1 DD, RV function normal, LA size moderately dilated, mild mitral regurg.  No mitral stenosis.  No aortic valve abnormalities. 06/01/2022 WUX:LKGMWN obstructive sleep apnea with AHI of 40 and SpO2 low of 79%. 08/02/2022 LDCT chest: Centrilobular and paraseptal emphysema evident.  Tiny bilateral pulmonary nodules maximum size of 4.1 mm no suspicious pulmonary nodule or mass.  No focal airspace consolidation.  Lung RADS 2, benign appearance and behavior, continue yearly monitoring. 10/09/2022 in lab sleep study: No evidence of sleep apnea!  AHI 0.4, supine AHI 2.3, patient was noted to have nocturnal hypoxemia likely related to COPD/obesity O2 nadir 83% recommended 1 L/min supplemental oxygen.  Some PLMS with arousals.   Review of Systems A 10 point review of systems was performed and it is as noted above otherwise negative.   Patient Active Problem List   Diagnosis Date Noted   Homelessness 09/22/2021   Advance directive discussed with patient 01/30/2021   Scoliosis 08/02/2015   Chronic fatigue 02/06/2015   CKD (chronic kidney  disease) stage 3, GFR 30-59 ml/min (HCC) 02/06/2015   Nasal polyp, posterior 02/03/2015   Muscle spasms of head and/or neck 11/11/2014   Neck pain of over 3 months duration 10/21/2014   GERD (gastroesophageal reflux disease)    Hypothyroidism    Migraines    Depression, major, recurrent, moderate (HCC) 07/20/2014    Class: Chronic   OSA  (obstructive sleep apnea) 02/15/2014   Atrial fibrillation (HCC) 08/29/2012   Dyspnea on exertion 08/29/2012   Osteoarthritis 02/25/2008   Hyperlipidemia 01/29/2007   COPD (chronic obstructive pulmonary disease) (HCC) 01/29/2007   Anxiety state 01/28/2007   Tobacco abuse 01/28/2007   OVARIAN CYST, LEFT 05/14/2006    Social History   Tobacco Use   Smoking status: Former    Current packs/day: 1.50    Average packs/day: 1.5 packs/day for 39.0 years (58.5 ttl pk-yrs)    Types: Cigarettes   Smokeless tobacco: Never   Tobacco comments:    Quit smoking cigarettes 10/20/2022 khj  Substance Use Topics   Alcohol use: Yes    Alcohol/week: 0.0 standard drinks of alcohol    Comment: Rare    Allergies  Allergen Reactions   Bee Venom Anaphylaxis   Penicillins Rash   Sulfonamide Derivatives Rash   Cardizem  [Diltiazem Hcl] Other (See Comments)    Other Reaction: severe hypotension w/ IV dose   Sulfa Antibiotics Hives   Tape Rash    Uncoded Allergy. Allergen: tape & bandaids    Current Meds  Medication Sig   albuterol (VENTOLIN HFA) 108 (90 Base) MCG/ACT inhaler Inhale 2 puffs into the lungs every 6 (six) hours as needed for wheezing or shortness of breath.   atorvastatin (LIPITOR) 40 MG tablet Take 1 tablet (40 mg total) by mouth daily.   citalopram (CELEXA) 40 MG tablet Take 1.5 tablets (60 mg total) by mouth daily.   furosemide (LASIX) 20 MG tablet Take 1 tablet (20 mg total) by mouth 3 (three) times a week.   latanoprost (XALATAN) 0.005 % ophthalmic solution Place 1 drop into both eyes at bedtime.   levothyroxine (SYNTHROID) 125 MCG tablet Take 1 tablet (125 mcg total) by mouth daily with breakfast.   OXYGEN Inhale into the lungs.   pantoprazole (PROTONIX) 20 MG tablet Take 1 tablet (20 mg total) by mouth daily.   propafenone (RYTHMOL) 225 MG tablet TAKE 1 TABLET BY MOUTH DAILY AS NEEDED FOR AFIB   Spacer/Aero-Holding Chambers DEVI 1 Device by Does not apply route as needed.    Tiotropium Bromide-Olodaterol (STIOLTO RESPIMAT) 2.5-2.5 MCG/ACT AERS Inhale 2 puffs into the lungs daily.    Immunization History  Administered Date(s) Administered   [employer] Vaccination 05/10/2020   PFIZER(Purple Top)SARS-COV-2 Vaccination 08/18/2019, 09/05/2019   Pneumococcal Conjugate-13 01/30/2021   Td 05/08/1995, 12/14/2008   Tdap 11/20/2019        Objective:   BP 128/80 (BP Location: Right Arm, Cuff Size: Large)   Pulse 83   Temp (!) 96.9 F (36.1 C)   Ht 5' 4.76" (1.645 m)   Wt 212 lb 4.8 oz (96.3 kg)   SpO2 93%   BMI 35.59 kg/m   SpO2: 93 % O2 Device: None (Room air)  GENERAL: Obese woman, no acute distress, fully ambulatory, no conversational dyspnea. HEAD: Normocephalic, atraumatic.  EYES: Pupils equal, round, reactive to light.  No scleral icterus.  MOUTH: Poor dentition, missing teeth, chipped, Mallampati class III. NECK: Supple. No thyromegaly. Trachea midline. No JVD.  No adenopathy. PULMONARY: Good air entry bilaterally.  No  adventitious sounds. CARDIOVASCULAR: S1 and S2. Regular rate and rhythm.  No rubs, murmurs or gallops heard. ABDOMEN: Obese otherwise, benign. MUSCULOSKELETAL: Significant kyphosis noted no joint deformity, no clubbing, no edema.  NEUROLOGIC: No overt focal deficit, no gait disturbance, speech is fluent. SKIN: Intact,warm,dry. PSYCH: Mood and behavior normal.   Assessment & Plan:     ICD-10-CM   1. Stage 1 mild COPD by GOLD classification (HCC)  J44.9     2. Nocturnal hypoxemia due to obesity  E66.9    G47.36     3. SOB (shortness of breath)  R06.02     4. Obesity (BMI 35.0-39.9 without comorbidity)  E66.9       Discusion:    Chronic Obstructive Pulmonary Disease (COPD) Reports occasional dyspnea with exertion. Using oxygen concentrator and finds Stiolto beneficial. Lungs clear on examination. No recent albuterol use. Emphasized the importance of nocturnal oxygen therapy to manage symptoms and prevent  exacerbations. - Continue nocturnal oxygen therapy - Follow-up in 4-6 months  Atrial Fibrillation On Rythmol (propafenone) as needed, with infrequent use (once in six months). Currently in sinus rhythm. Discussed the impact of weight and medication on symptoms. Emphasized monitoring symptoms and adherence to medication for rhythm control.  Patient follows with cardiology. - Continue Rythmol as needed - Follow-up with cardiology  Hypothyroidism On Synthroid but reports difficulty with weight loss and symptoms suggestive of inadequate thyroid hormone conversion. Discussed potential adjustment to include T3 (Cytomel) with T4 (Synthroid) for optimal thyroid function. Explained benefits, including improved metabolism and weight management. - Discuss thyroid hormone therapy adjustment with primary care physician - Consider checking thyroid function tests including T3 and T4 levels  General Health Maintenance Has not received a flu shot due to a previous adverse reaction requiring hospitalization.  Follow-up - Follow-up appointment in 4-6 months.      Advised if symptoms do not improve or worsen, to please contact office for sooner follow up or seek emergency care.    I spent 30 minutes of dedicated to the care of this patient on the date of this encounter to include pre-visit review of records, face-to-face time with the patient discussing conditions above, post visit ordering of testing, clinical documentation with the electronic health record, making appropriate referrals as documented, and communicating necessary findings to members of the patients care team.     C. Danice Goltz, MD Advanced Bronchoscopy P[facility]    *This note was generated using voice recognition software/Dragon and/or AI transcription program.  Despite best efforts to proofread, errors can occur which can change the meaning. Any transcriptional errors that result from this process are  unintentional and may not be fully corrected at the time of dictation.

## 2023-04-25 NOTE — Patient Instructions (Signed)
VISIT SUMMARY:  During today's visit, we discussed your concerns about weight gain and shortness of breath. We reviewed your current medications and their effectiveness, and we talked about your history of heart disease, and thyroid issues. We also addressed your use of oxygen therapy and your decision to decline the flu shot due to a previous adverse reaction.  YOUR PLAN:  -CHRONIC OBSTRUCTIVE PULMONARY DISEASE (COPD): COPD is a chronic lung condition that makes it hard to breathe. You reported occasional shortness of breath with exertion and find your current medication, Stiolto, beneficial. We emphasized the importance of continuing your nocturnal oxygen therapy to manage your symptoms and prevent flare-ups. Please continue using your oxygen concentrator at night and follow up in 4-6 months.  -ATRIAL FIBRILLATION: Atrial fibrillation is an irregular and often rapid heart rate. You are currently taking Rythmol as needed and have used it infrequently in the past six months. We discussed the importance of monitoring your symptoms and adhering to your medication to control your heart rhythm. Please continue taking Rythmol as needed.  -HYPOTHYROIDISM: Hypothyroidism is a condition where your thyroid gland does not produce enough thyroid hormone. You are currently on Synthroid but are experiencing difficulty with weight loss and other symptoms. We discussed the possibility of adjusting your thyroid medication to include T3 (Cytomel) along with T4 (Synthroid) to improve your metabolism and help with weight management. Please discuss this potential adjustment with your primary care physician and consider checking your thyroid function tests, including T3 and T4 levels.  -GENERAL HEALTH MAINTENANCE: You have not received a flu shot due to a previous adverse reaction that required hospitalization. We respect your decision and will continue to monitor your overall health.  INSTRUCTIONS:  Please schedule a  follow-up appointment in 4-6 months. Additionally, discuss the potential adjustment of your thyroid medication with your primary care physician and consider checking your thyroid function tests, including T3 and T4 levels.

## 2023-05-06 ENCOUNTER — Ambulatory Visit (INDEPENDENT_AMBULATORY_CARE_PROVIDER_SITE_OTHER): Payer: BC Managed Care – PPO | Admitting: Family Medicine

## 2023-05-06 ENCOUNTER — Encounter: Payer: Self-pay | Admitting: Family Medicine

## 2023-05-06 VITALS — BP 138/76 | HR 73 | Wt 214.0 lb

## 2023-05-06 DIAGNOSIS — M25561 Pain in right knee: Secondary | ICD-10-CM | POA: Diagnosis not present

## 2023-05-06 MED ORDER — NAPROXEN 500 MG PO TABS: 500.0000 mg | ORAL_TABLET | Freq: Two times a day (BID) | ORAL | 0 refills | Status: DC

## 2023-05-06 MED ORDER — KETOROLAC TROMETHAMINE 60 MG/2ML IM SOLN
60.0000 mg | Freq: Once | INTRAMUSCULAR | Status: AC
  Administered 2023-05-06: 60 mg via INTRAMUSCULAR

## 2023-05-06 NOTE — Progress Notes (Signed)
BP 138/76   Pulse 73   Wt 214 lb (97.1 kg)   SpO2 97%   BMI 35.88 kg/m    Subjective:    [name], female    DOB: [birth year], 67 y.o.   MRN: [id number]  HPI: [name] is a 67 y.o. female  Chief Complaint  Patient presents with   Knee Injury    Patient says she was playing fetch with her dogs and says when she came back in from outside and was going up the stairs and says she fell. Patient says she has noticed some issues with her R knee. Patient says she would like to discuss with provider. Patient says the pain is started to effect her back.    KNEE PAIN Duration: 6 days Involved knee: R knee Mechanism of injury: fell up some stairs Location:diffuse Onset: sudden Severity: severe  Quality:  sharp and burning Frequency: intermittent Radiation: into her back Aggravating factors: stairs, standing on it  Alleviating factors: rest  Status: stable Treatments attempted: rest and ice  Relief with NSAIDs?:  No NSAIDs Taken Weakness with weight bearing or walking: yes Sensation of giving way: yes Locking: no Popping: no Bruising: no Swelling: yes Redness: no Paresthesias/decreased sensation: no Fevers: no  Relevant past medical, surgical, family and social history reviewed and updated as indicated. Interim medical history since our last visit reviewed. Allergies and medications reviewed and updated.  Review of Systems  Constitutional: Negative.   Respiratory: Negative.    Cardiovascular: Negative.   Gastrointestinal: Negative.   Musculoskeletal:  Positive for arthralgias, back pain and joint swelling. Negative for gait problem, myalgias, neck pain and neck stiffness.  Skin: Negative.   Psychiatric/Behavioral: Negative.      Per HPI unless specifically indicated above     Objective:    BP 138/76   Pulse 73   Wt 214 lb (97.1 kg)   SpO2 97%   BMI 35.88 kg/m   Wt Readings from Last 3 Encounters:  05/06/23 214 lb (97.1 kg)  04/25/23  212 lb 4.8 oz (96.3 kg)  03/28/23 215 lb 12.8 oz (97.9 kg)    Physical Exam Vitals and nursing note reviewed.  Constitutional:      General: She is not in acute distress.    Appearance: Normal appearance. She is well-developed.  HENT:     Head: Normocephalic and atraumatic.     Right Ear: Hearing and external ear normal.     Left Ear: Hearing and external ear normal.     Nose: Nose normal.     Mouth/Throat:     Mouth: Mucous membranes are moist.     Pharynx: Oropharynx is clear.  Eyes:     General: Lids are normal. No scleral icterus.       Right eye: No discharge.        Left eye: No discharge.     Conjunctiva/sclera: Conjunctivae normal.  Pulmonary:     Effort: Pulmonary effort is normal. No respiratory distress.  Musculoskeletal:        General: Swelling and tenderness (R tibial plateau) present. No deformity or signs of injury.     Right lower leg: No edema.     Left lower leg: No edema.  Skin:    Coloration: Skin is not jaundiced or pale.     Findings: No bruising, erythema, lesion or rash.  Neurological:     Mental Status: She is alert. Mental status is at baseline. She is disoriented.  Psychiatric:        Mood and Affect: Mood normal.        Speech: Speech normal.        Behavior: Behavior normal.        Thought Content: Thought content normal.        Judgment: Judgment normal.     Results for orders placed or performed in visit on 03/28/23  Microalbumin, Urine Waived   Collection Time: 03/28/23 10:13 AM  Result Value Ref Range   Microalb, Ur Waived 80 (H) 0 - 19 mg/L   Creatinine, Urine Waived 200 10 - 300 mg/dL   Microalb/Creat Ratio <30 <30 mg/g  CBC with Differential/Platelet   Collection Time: 03/28/23 10:14 AM  Result Value Ref Range   WBC 6.9 3.4 - 10.8 x10E3/uL   RBC 4.50 3.77 - 5.28 x10E6/uL   Hemoglobin 14.0 11.1 - 15.9 g/dL   Hematocrit 16.1 09.6 - 46.6 %   MCV 95 79 - 97 fL   MCH 31.1 26.6 - 33.0 pg   MCHC 32.8 31.5 - 35.7 g/dL   RDW 04.5  40.9 - 81.1 %   Platelets 235 150 - 450 x10E3/uL   Neutrophils 65 Not Estab. %   Lymphs 26 Not Estab. %   Monocytes 7 Not Estab. %   Eos 1 Not Estab. %   Basos 1 Not Estab. %   Neutrophils Absolute 4.5 1.4 - 7.0 x10E3/uL   Lymphocytes Absolute 1.8 0.7 - 3.1 x10E3/uL   Monocytes Absolute 0.5 0.1 - 0.9 x10E3/uL   EOS (ABSOLUTE) 0.1 0.0 - 0.4 x10E3/uL   Basophils Absolute 0.0 0.0 - 0.2 x10E3/uL   Immature Granulocytes 0 Not Estab. %   Immature Grans (Abs) 0.0 0.0 - 0.1 x10E3/uL  Comprehensive metabolic panel   Collection Time: 03/28/23 10:14 AM  Result Value Ref Range   Glucose 101 (H) 70 - 99 mg/dL   BUN 12 8 - 27 mg/dL   Creatinine, Ser 9.14 0.57 - 1.00 mg/dL   eGFR 66 >78 GN/FAO/1.30   BUN/Creatinine Ratio 13 12 - 28   Sodium 143 134 - 144 mmol/L   Potassium 4.1 3.5 - 5.2 mmol/L   Chloride 104 96 - 106 mmol/L   CO2 26 20 - 29 mmol/L   Calcium 9.0 8.7 - 10.3 mg/dL   Total Protein 6.2 6.0 - 8.5 g/dL   Albumin 4.2 3.9 - 4.9 g/dL   Globulin, Total 2.0 1.5 - 4.5 g/dL   Bilirubin Total 0.3 0.0 - 1.2 mg/dL   Alkaline Phosphatase 88 44 - 121 IU/L   AST 18 0 - 40 IU/L   ALT 14 0 - 32 IU/L  Lipid Panel w/o Chol/HDL Ratio   Collection Time: 03/28/23 10:14 AM  Result Value Ref Range   Cholesterol, Total 159 100 - 199 mg/dL   Triglycerides 865 0 - 149 mg/dL   HDL 44 >78 mg/dL   VLDL Cholesterol Cal 20 5 - 40 mg/dL   LDL Chol Calc (NIH) 95 0 - 99 mg/dL  TSH   Collection Time: 03/28/23 10:14 AM  Result Value Ref Range   TSH 2.670 0.450 - 4.500 uIU/mL      Assessment & Plan:   Problem List Items Addressed This Visit   None Visit Diagnoses       Acute pain of right knee    -  Primary   Likely sprain- will treat with naproxen and RICE. If not getting better by end of the week will get x-ray  and call. Call with any concerns.   Relevant Medications   ketorolac (TORADOL) injection 60 mg (Completed)   Other Relevant Orders   DG Knee Complete 4 Views Right        Follow up  plan: Return if symptoms worsen or fail to improve.

## 2023-05-14 ENCOUNTER — Ambulatory Visit
Admission: RE | Admit: 2023-05-14 | Discharge: 2023-05-14 | Disposition: A | Discharge status: Home / Self Care | Payer: 59 | Attending: Family Medicine | Admitting: Family Medicine

## 2023-05-14 ENCOUNTER — Ambulatory Visit
Admission: RE | Admit: 2023-05-14 | Discharge: 2023-05-14 | Disposition: A | Discharge status: Home / Self Care | Payer: 59 | Source: Ambulatory Visit | Attending: Family Medicine

## 2023-05-14 DIAGNOSIS — M25561 Pain in right knee: Secondary | ICD-10-CM | POA: Insufficient documentation

## 2023-05-27 ENCOUNTER — Encounter: Payer: Self-pay | Admitting: Family Medicine

## 2023-06-18 ENCOUNTER — Ambulatory Visit
Admission: RE | Admit: 2023-06-18 | Discharge: 2023-06-18 | Disposition: A | Discharge status: Home / Self Care | Payer: 59 | Source: Ambulatory Visit | Attending: Family Medicine | Admitting: Family Medicine

## 2023-06-18 DIAGNOSIS — Z780 Asymptomatic menopausal state: Secondary | ICD-10-CM | POA: Diagnosis present

## 2023-06-18 DIAGNOSIS — Z1231 Encounter for screening mammogram for malignant neoplasm of breast: Secondary | ICD-10-CM | POA: Diagnosis present

## 2023-06-19 ENCOUNTER — Encounter: Payer: Self-pay | Admitting: Family Medicine

## 2023-06-20 ENCOUNTER — Encounter: Payer: Self-pay | Admitting: Family Medicine

## 2023-08-07 ENCOUNTER — Encounter: Payer: Self-pay | Admitting: Acute Care

## 2023-08-19 ENCOUNTER — Other Ambulatory Visit: Payer: Self-pay

## 2023-08-19 DIAGNOSIS — Z122 Encounter for screening for malignant neoplasm of respiratory organs: Secondary | ICD-10-CM

## 2023-08-19 DIAGNOSIS — Z87891 Personal history of nicotine dependence: Secondary | ICD-10-CM

## 2023-08-22 ENCOUNTER — Encounter: Payer: Self-pay | Admitting: Pulmonary Disease

## 2023-08-22 ENCOUNTER — Ambulatory Visit: Payer: BC Managed Care – PPO | Admitting: Pulmonary Disease

## 2023-08-22 VITALS — BP 126/80 | HR 65 | Ht 64.75 in | Wt 211.0 lb

## 2023-08-22 DIAGNOSIS — G4736 Sleep related hypoventilation in conditions classified elsewhere: Secondary | ICD-10-CM

## 2023-08-22 DIAGNOSIS — R0602 Shortness of breath: Secondary | ICD-10-CM | POA: Diagnosis not present

## 2023-08-22 DIAGNOSIS — J449 Chronic obstructive pulmonary disease, unspecified: Secondary | ICD-10-CM | POA: Diagnosis not present

## 2023-08-22 DIAGNOSIS — E669 Obesity, unspecified: Secondary | ICD-10-CM

## 2023-08-22 LAB — NITRIC OXIDE: Nitric Oxide: 14

## 2023-08-22 NOTE — Progress Notes (Signed)
 Subjective:    [name], female    DOB: 15-Sep-1955, 68 y.o.   MRN: [id number]  Patient Care Team: Dorcas Carrow, DO as PCP - General (Family Medicine) Bridgett Larsson, LCSW as Social Worker (Licensed Clinical Social Worker)  Chief Complaint  Patient presents with   Follow-up    Shortness of breath on exertion.     BACKGROUND/INTERVAL: [name] is a 68 year old recent former smoker with a 40-pack-year history of smoking and a history as noted below who follows for the issue of shortness of breath.  She was last seen here on 06 December 2022.  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  She has nocturnal hypoxemia due to COPD/obesity, she is on 1-1/2 L of oxygen nocturnally.  He has not had any exacerbations since her prior visit.   HPI Discussed the use of AI scribe software for clinical note transcription with the patient, who gave verbal consent to proceed.  History of Present Illness   [name] S Hoe "[name]" is a 68 year old female who presents with shortness of breath and difficulty losing weight.  She has been experiencing shortness of breath intermittently over the last couple of months. The episodes are random and inconsistent. She has used albuterol a couple of times recently, which has helped alleviate the symptoms. She continues to use Stiolto Respimat daily in the morning.  She is struggling with weight loss despite regular physical activity and dietary monitoring. She walks regularly in a park and on flat terrain in a 40-acre field and uses resistance bands for exercise. She suspects her thyroid may be contributing to her difficulty losing weight and is scheduled to see [name] in July for follow-up regarding her thyroid.  She reports poor sleep, attributing it to work-related stress. She works at [employer] in [employer] and is currently dealing with the stress of year-end preparations.   She has been evaluated for obstructive  sleep apnea with an in lab sleep study showing no evidence of sleep apnea.  She does have nocturnal hypoxemia related to COPD/obesity and is on 1 L/min supplemental oxygen.  She is compliant with the therapy and notes benefit.  She previously participated in pulmonary rehab in April 2024 and noted that she "loved it".  He actually did very well for a while after that.  This is suggestive that her issues are more related to obesity and deconditioning.    DATA 12/26/2021 PFTs: FEV1 2.41 L or 95% predicted, FVC 3.27 L or 99% predicted FEV1/FVC 74%, lung volumes low normal.  Postbronchodilator study not done.  Diffusion capacity mildly reduced.  There is curvature of the flow-volume loop consistent with mild obstruction. 12/26/2021 echocardiogram: LVEF 60 to 65%, no regional wall motion abnormalities, grade 1 DD, RV function normal, LA size moderately dilated, mild mitral regurg.  No mitral stenosis.  No aortic valve abnormalities. 06/01/2022 VHQ:IONGEX obstructive sleep apnea with AHI of 40 and SpO2 low of 79%. 08/02/2022 LDCT chest: Centrilobular and paraseptal emphysema evident.  Tiny bilateral pulmonary nodules maximum size of 4.1 mm no suspicious pulmonary nodule or mass.  No focal airspace consolidation.  Lung RADS 2, benign appearance and behavior, continue yearly monitoring. 10/09/2022 in lab sleep study: No evidence of sleep apnea.  AHI 0.4, supine AHI 2.3, patient was noted to have nocturnal hypoxemia likely related to COPD/obesity O2 nadir 83% recommended 1 L/min supplemental oxygen.  Some PLMS with arousals.  Review of Systems A 10 point review of  systems was performed and it is as noted above otherwise negative.   Patient Active Problem List   Diagnosis Date Noted   Homelessness 09/22/2021   Advance directive discussed with patient 01/30/2021   Scoliosis 08/02/2015   Chronic fatigue 02/06/2015   CKD (chronic kidney disease) stage 3, GFR 30-59 ml/min (HCC) 02/06/2015   Nasal polyp,  posterior 02/03/2015   Muscle spasms of head and/or neck 11/11/2014   Neck pain of over 3 months duration 10/21/2014   GERD (gastroesophageal reflux disease)    Hypothyroidism    Migraines    Depression, major, recurrent, moderate (HCC) 07/20/2014    Class: Chronic   OSA (obstructive sleep apnea) 02/15/2014   Atrial fibrillation (HCC) 08/29/2012   Dyspnea on exertion 08/29/2012   Osteoarthritis 02/25/2008   Hyperlipidemia 01/29/2007   COPD (chronic obstructive pulmonary disease) (HCC) 01/29/2007   Anxiety state 01/28/2007   Tobacco abuse 01/28/2007   OVARIAN CYST, LEFT 05/14/2006    Social History   Tobacco Use   Smoking status: Former    Current packs/day: 1.50    Average packs/day: 1.5 packs/day for 39.0 years (58.5 ttl pk-yrs)    Types: Cigarettes   Smokeless tobacco: Never   Tobacco comments:    Quit smoking cigarettes 10/20/2022 khj  Substance Use Topics   Alcohol use: Yes    Alcohol/week: 0.0 standard drinks of alcohol    Comment: Rare    Allergies  Allergen Reactions   Bee Venom Anaphylaxis   Penicillins Rash   Sulfonamide Derivatives Rash   Cardizem  [Diltiazem Hcl] Other (See Comments)    Other Reaction: severe hypotension w/ IV dose   Sulfa Antibiotics Hives   Tape Rash    Uncoded Allergy. Allergen: tape & bandaids    Current Meds  Medication Sig   albuterol (VENTOLIN HFA) 108 (90 Base) MCG/ACT inhaler Inhale 2 puffs into the lungs every 6 (six) hours as needed for wheezing or shortness of breath.   atorvastatin (LIPITOR) 40 MG tablet Take 1 tablet (40 mg total) by mouth daily.   citalopram (CELEXA) 40 MG tablet Take 1.5 tablets (60 mg total) by mouth daily.   furosemide (LASIX) 20 MG tablet Take 1 tablet (20 mg total) by mouth 3 (three) times a week. (Patient taking differently: Take 20 mg by mouth 3 (three) times a week. As needed.)   latanoprost (XALATAN) 0.005 % ophthalmic solution Place 1 drop into both eyes at bedtime.   levothyroxine (SYNTHROID)  125 MCG tablet Take 1 tablet (125 mcg total) by mouth daily with breakfast.   OXYGEN Inhale into the lungs.   pantoprazole (PROTONIX) 20 MG tablet Take 1 tablet (20 mg total) by mouth daily.   propafenone (RYTHMOL) 225 MG tablet TAKE 1 TABLET BY MOUTH DAILY AS NEEDED FOR AFIB   Spacer/Aero-Holding Chambers DEVI 1 Device by Does not apply route as needed.   Tiotropium Bromide-Olodaterol (STIOLTO RESPIMAT) 2.5-2.5 MCG/ACT AERS Inhale 2 puffs into the lungs daily.    Immunization History  Administered Date(s) Administered   Moderna Sars-Covid-2 Vaccination 05/10/2020   PFIZER(Purple Top)SARS-COV-2 Vaccination 08/18/2019, 09/05/2019   Pneumococcal Conjugate-13 01/30/2021   Td 05/08/1995, 12/14/2008   Tdap 11/20/2019        Objective:     BP 126/80 (BP Location: Left Arm, Patient Position: Sitting, Cuff Size: Normal)   Pulse 65   Ht 5' 4.75" (1.645 m)   Wt 211 lb (95.7 kg)   SpO2 97%   BMI 35.38 kg/m   SpO2: 97 %  GENERAL:  Obese woman, no acute distress, fully ambulatory, no conversational dyspnea. HEAD: Normocephalic, atraumatic.  EYES: Pupils equal, round, reactive to light.  No scleral icterus.  MOUTH: Poor dentition, missing teeth, chipped. NECK: Supple. No thyromegaly. Trachea midline. No JVD.  No adenopathy. PULMONARY: Good air entry bilaterally.  No adventitious sounds. CARDIOVASCULAR: S1 and S2. Regular rate and rhythm.  No rubs, murmurs or gallops heard. ABDOMEN: Obese otherwise, benign. MUSCULOSKELETAL: Significant kyphosis noted no joint deformity, no clubbing, no edema.  NEUROLOGIC: No overt focal deficit, no gait disturbance, speech is fluent. SKIN: Intact,warm,dry. PSYCH: Mood and behavior normal.  Lab Results  Component Value Date   NITRICOXIDE 14 08/22/2023  *No evidence of type II inflammation (IL-13 mediated, eosinophilic)       Assessment & Plan:     ICD-10-CM   1. SOB (shortness of breath)  R06.02 Pulmonary function test    Nitric oxide    2.  Stage 1 mild COPD by GOLD classification (HCC)  J44.9 Pulmonary function test    3. Nocturnal hypoxemia due to obesity  E66.9    G47.36     4. Obesity (BMI 35.0-39.9 without comorbidity)  E66.9       Orders Placed This Encounter  Procedures   Nitric oxide   Pulmonary function test    Standing Status:   Future    Expected Date:   09/21/2023    Expiration Date:   08/21/2024    Where should this test be performed?:   Outpatient Pulmonary    What type of PFT is being ordered?:   Full PFT    MIP/MEP:   Yes   Discussion:    Shortness of breath Intermittent shortness of breath over the last couple of months. No evidence of airway inflammation, ruling out superimposed asthma. Current inhaler (Stiolto) used in the morning, with occasional use of albuterol providing relief. - Order repeat pulmonary function tests - Continue current inhaler therapy with Stiolto - Has already participated in pulmonary rehab  Obesity Difficulty losing weight despite walking and dietary monitoring. Suspects thyroid involvement but follows up with [name] for thyroid management. No immediate changes planned until further evaluation. - Continue current exercise and dietary regimen - Follow up with [name] for thyroid evaluation in July   Stage I COPD Stable on current management.  No evidence of superimposed asthma.   Will see the patient in follow-up in 2 months time she is to contact us prior to that time should any new difficulties arise.  Advised if symptoms do not improve or worsen, to please contact office for sooner follow up or seek emergency care.    I spent 34 minutes of dedicated to the care of this patient on the date of this encounter to include pre-visit review of records, face-to-face time with the patient discussing conditions above, post visit ordering of testing, clinical documentation with the electronic health record, making appropriate referrals as documented, and communicating  necessary findings to members of the patients care team.     C. Danice Goltz, MD Advanced Bronchoscopy PCCM [facility]    *This note was generated using voice recognition software/Dragon and/or AI transcription program.  Despite best efforts to proofread, errors can occur which can change the meaning. Any transcriptional errors that result from this process are unintentional and may not be fully corrected at the time of dictation.

## 2023-08-22 NOTE — Patient Instructions (Signed)
 VISIT SUMMARY:  Today, we discussed your shortness of breath and difficulty losing weight. We reviewed your current treatments and made plans for further evaluation and management.  YOUR PLAN:  -SHORTNESS OF BREATH: Shortness of breath can be caused by various factors, including lung conditions. You have been experiencing this intermittently and have found some relief with your current inhalers. We will order repeat pulmonary function tests to further evaluate your lung function. Continue using your Stiolto inhaler daily and albuterol as needed. If your symptoms persist, we may consider pulmonary rehabilitation.  -OBESITY: Obesity can make it difficult to lose weight and may be influenced by various factors, including thyroid function. You are maintaining a regular exercise routine and monitoring your diet. We will wait for your follow-up with Dr. Lincoln Renshaw in July to evaluate your thyroid function before making any changes to your current regimen. Continue with your current exercise and dietary plan.  INSTRUCTIONS:  Please follow up with Dr. Lincoln Renshaw in July for your thyroid evaluation. Continue using your Stiolto inhaler daily and albuterol as needed. If your shortness of breath persists, we may consider pulmonary rehabilitation. Maintain your current exercise and dietary regimen.

## 2023-08-29 ENCOUNTER — Ambulatory Visit
Admission: RE | Admit: 2023-08-29 | Discharge: 2023-08-29 | Disposition: A | Discharge status: Home / Self Care | Source: Ambulatory Visit | Attending: Cardiology | Admitting: Cardiology

## 2023-08-29 DIAGNOSIS — Z87891 Personal history of nicotine dependence: Secondary | ICD-10-CM

## 2023-08-29 DIAGNOSIS — Z122 Encounter for screening for malignant neoplasm of respiratory organs: Secondary | ICD-10-CM

## 2023-09-23 ENCOUNTER — Other Ambulatory Visit: Payer: Self-pay | Admitting: Acute Care

## 2023-09-23 DIAGNOSIS — Z87891 Personal history of nicotine dependence: Secondary | ICD-10-CM

## 2023-09-23 DIAGNOSIS — Z122 Encounter for screening for malignant neoplasm of respiratory organs: Secondary | ICD-10-CM

## 2023-09-23 DIAGNOSIS — F17210 Nicotine dependence, cigarettes, uncomplicated: Secondary | ICD-10-CM

## 2023-10-02 ENCOUNTER — Encounter: Payer: Self-pay | Admitting: Family Medicine

## 2023-10-02 ENCOUNTER — Ambulatory Visit: Payer: Self-pay | Admitting: Family Medicine

## 2023-10-02 VITALS — BP 135/86 | HR 72 | Ht 65.0 in | Wt 214.6 lb

## 2023-10-02 DIAGNOSIS — E782 Mixed hyperlipidemia: Secondary | ICD-10-CM

## 2023-10-02 DIAGNOSIS — M545 Low back pain, unspecified: Secondary | ICD-10-CM

## 2023-10-02 DIAGNOSIS — J449 Chronic obstructive pulmonary disease, unspecified: Secondary | ICD-10-CM

## 2023-10-02 DIAGNOSIS — E039 Hypothyroidism, unspecified: Secondary | ICD-10-CM | POA: Diagnosis not present

## 2023-10-02 DIAGNOSIS — F331 Major depressive disorder, recurrent, moderate: Secondary | ICD-10-CM

## 2023-10-02 DIAGNOSIS — R739 Hyperglycemia, unspecified: Secondary | ICD-10-CM

## 2023-10-02 DIAGNOSIS — G8929 Other chronic pain: Secondary | ICD-10-CM

## 2023-10-02 LAB — BAYER DCA HB A1C WAIVED: HB A1C (BAYER DCA - WAIVED): 5.5 % (ref 4.8–5.6)

## 2023-10-02 MED ORDER — ALBUTEROL SULFATE HFA 108 (90 BASE) MCG/ACT IN AERS: 2.0000 | INHALATION_SPRAY | Freq: Four times a day (QID) | RESPIRATORY_TRACT | 2 refills | Status: AC | PRN

## 2023-10-02 MED ORDER — ATORVASTATIN CALCIUM 40 MG PO TABS: 40.0000 mg | ORAL_TABLET | Freq: Every day | ORAL | 1 refills | Status: AC

## 2023-10-02 MED ORDER — CITALOPRAM HYDROBROMIDE 40 MG PO TABS: 60.0000 mg | ORAL_TABLET | Freq: Every day | ORAL | 1 refills | Status: DC

## 2023-10-02 NOTE — Progress Notes (Signed)
 BP 135/86 (BP Location: Left Arm, Patient Position: Sitting, Cuff Size: Normal)   Pulse 72   Ht 5\' 5"  (1.651 m)   Wt 214 lb 9.6 oz (97.3 kg)   SpO2 96%   BMI 35.71 kg/m    Subjective:    [name], female    DOB: 1956/02/02, 68 y.o.   MRN: [id number]  HPI: [name] is a 68 y.o. female  Chief Complaint  Patient presents with   Hypothyroidism   Depression   BACK PAIN Duration: chronic Mechanism of injury: unknown Location: bilateral and low back Onset: gradual Severity: severe Quality: aching and shooting Frequency: constant Radiation: into her R leg Aggravating factors: moving, lifting Alleviating factors: nothing Status: worse Treatments attempted: rest, ice, heat, APAP, ibuprofen, and aleve   Relief with NSAIDs?: mild Nighttime pain:  no Paresthesias / decreased sensation:  no Bowel / bladder incontinence:  no Fevers:  no Dysuria / urinary frequency:  no  HYPOTHYROIDISM Thyroid  control status: unsure Satisfied with current treatment? yes Medication side effects: no Medication compliance: excellent compliance Recent dose adjustment:no Fatigue: yes Cold intolerance: no Heat intolerance: no Weight gain: yes Weight loss: no Constipation: no Diarrhea/loose stools: no Palpitations: no Lower extremity edema: no Anxiety/depressed mood: no  HYPERLIPIDEMIA Hyperlipidemia status: excellent compliance Satisfied with current treatment?  yes Side effects:  no Medication compliance: excellent compliance Past cholesterol meds: atorvastatin  Supplements: none Aspirin:  no The 10-year ASCVD risk score (Arnett DK, et al., 2019) is: 9.9%   Values used to calculate the score:     Age: 72 years     Sex: Female     Is Non-Hispanic African American: No     Diabetic: No     Tobacco smoker: No     Systolic Blood Pressure: 135 mmHg     Is BP treated: Yes     HDL Cholesterol: 44 mg/dL     Total Cholesterol: 159 mg/dL Chest pain:  no Coronary  artery disease:  yes  DEPRESSION Mood status: stable Satisfied with current treatment?: yes Symptom severity: mild  Duration of current treatment : chronic Side effects: no Medication compliance: excellent compliance Psychotherapy/counseling: no  Previous psychiatric medications: celexa  Depressed mood: no Anxious mood: no Anhedonia: no Significant weight loss or gain: no Insomnia: no  Fatigue: no Feelings of worthlessness or guilt: no Impaired concentration/indecisiveness: no Suicidal ideations: no Hopelessness: no Crying spells: no    10/02/2023    9:35 AM 10/02/2023    9:30 AM 05/06/2023    1:09 PM 12/20/2022    5:20 PM 09/24/2022   10:19 AM  Depression screen PHQ 2/9  Decreased Interest 0 0 0 0 1  Down, Depressed, Hopeless 0 0 0 0 1  PHQ - 2 Score 0 0 0 0 2  Altered sleeping 2 2 1 2 2   Tired, decreased energy 1 1 1 1 2   Change in appetite 1 1 0 0 1  Feeling bad or failure about yourself  0 0 0 0 0  Trouble concentrating 0 0 0 0 0  Moving slowly or fidgety/restless 0 0 0 0 0  Suicidal thoughts 0 0 0  0  PHQ-9 Score 4 4 2 3 7   Difficult doing work/chores   Not difficult at all Not difficult at all      Relevant past medical, surgical, family and social history reviewed and updated as indicated. Interim medical history since our last visit reviewed. Allergies and medications reviewed and updated.  Review of  Systems  Constitutional: Negative.   Respiratory: Negative.    Cardiovascular: Negative.   Musculoskeletal:  Positive for back pain and myalgias. Negative for arthralgias, gait problem, joint swelling, neck pain and neck stiffness.  Skin: Negative.   Neurological: Negative.   Psychiatric/Behavioral: Negative.      Per HPI unless specifically indicated above     Objective:     BP 135/86 (BP Location: Left Arm, Patient Position: Sitting, Cuff Size: Normal)   Pulse 72   Ht 5\' 5"  (1.651 m)   Wt 214 lb 9.6 oz (97.3 kg)   SpO2 96%   BMI 35.71 kg/m   Wt  Readings from Last 3 Encounters:  10/02/23 214 lb 9.6 oz (97.3 kg)  08/22/23 211 lb (95.7 kg)  05/06/23 214 lb (97.1 kg)    Physical Exam Vitals and nursing note reviewed.  Constitutional:      General: She is not in acute distress.    Appearance: Normal appearance. She is obese. She is not ill-appearing, toxic-appearing or diaphoretic.  HENT:     Head: Normocephalic and atraumatic.     Right Ear: External ear normal.     Left Ear: External ear normal.     Nose: Nose normal.     Mouth/Throat:     Mouth: Mucous membranes are moist.     Pharynx: Oropharynx is clear.  Eyes:     General: No scleral icterus.       Right eye: No discharge.        Left eye: No discharge.     Extraocular Movements: Extraocular movements intact.     Conjunctiva/sclera: Conjunctivae normal.     Pupils: Pupils are equal, round, and reactive to light.  Cardiovascular:     Rate and Rhythm: Normal rate and regular rhythm.     Pulses: Normal pulses.     Heart sounds: Normal heart sounds. No murmur heard.    No friction rub. No gallop.  Pulmonary:     Effort: Pulmonary effort is normal. No respiratory distress.     Breath sounds: Normal breath sounds. No stridor. No wheezing, rhonchi or rales.  Chest:     Chest wall: No tenderness.  Musculoskeletal:        General: Normal range of motion.     Cervical back: Normal range of motion and neck supple.  Skin:    General: Skin is warm and dry.     Capillary Refill: Capillary refill takes less than 2 seconds.     Coloration: Skin is not jaundiced or pale.     Findings: No bruising, erythema, lesion or rash.  Neurological:     General: No focal deficit present.     Mental Status: She is alert and oriented to person, place, and time. Mental status is at baseline.  Psychiatric:        Mood and Affect: Mood normal.        Behavior: Behavior normal.        Thought Content: Thought content normal.        Judgment: Judgment normal.     Results for orders  placed or performed in visit on 10/02/23  Bayer DCA Hb A1c Waived   Collection Time: 10/02/23  9:40 AM  Result Value Ref Range   HB A1C (BAYER DCA - WAIVED) 5.5 4.8 - 5.6 %      Assessment & Plan:   Problem List Items Addressed This Visit       Respiratory   COPD (chronic obstructive  pulmonary disease) (HCC)   Under good control on current regimen. Continue current regimen. Continue to monitor. Call with any concerns. Refills given. Labs drawn today.       Relevant Medications   albuterol  (VENTOLIN  HFA) 108 (90 Base) MCG/ACT inhaler   Other Relevant Orders   CBC with Differential/Platelet     Endocrine   Hypothyroidism   Rechecking labs today. Await results. Treat as needed.       Relevant Orders   Thyroid  Panel With TSH     Other   Hyperlipidemia   Under good control on current regimen. Continue current regimen. Continue to monitor. Call with any concerns. Refills given. Labs drawn today.        Relevant Medications   atorvastatin  (LIPITOR) 40 MG tablet   Other Relevant Orders   Comprehensive metabolic panel with GFR   Lipid Panel w/o Chol/HDL Ratio   Depression, major, recurrent, moderate (HCC)   Under good control on current regimen. Continue current regimen. Continue to monitor. Call with any concerns. Refills given. Labs drawn today.       Relevant Medications   citalopram  (CELEXA ) 40 MG tablet   Other Visit Diagnoses       Chronic bilateral low back pain without sciatica    -  Primary   Not doing well. Will obtain x-rays and start PT. Recheck 6-8 weeks. Call with any concerns.   Relevant Medications   citalopram  (CELEXA ) 40 MG tablet   Other Relevant Orders   DG Lumbar Spine Complete   Ambulatory referral to Physical Therapy     Hyperglycemia       Will check A1c. Await results.   Relevant Orders   Bayer DCA Hb A1c Waived (Completed)        Follow up plan: Return 6-8 weeks, follow up back.

## 2023-10-02 NOTE — Assessment & Plan Note (Signed)
 Under good control on current regimen. Continue current regimen. Continue to monitor. Call with any concerns. Refills given. Labs drawn today.

## 2023-10-02 NOTE — Assessment & Plan Note (Signed)
 Rechecking labs today. Await results. Treat as needed.

## 2023-10-03 LAB — COMPREHENSIVE METABOLIC PANEL WITH GFR
ALT: 17 IU/L (ref 0–32)
AST: 19 IU/L (ref 0–40)
Albumin: 4.3 g/dL (ref 3.9–4.9)
Alkaline Phosphatase: 92 IU/L (ref 44–121)
BUN/Creatinine Ratio: 11 — ABNORMAL LOW (ref 12–28)
BUN: 10 mg/dL (ref 8–27)
Bilirubin Total: 0.4 mg/dL (ref 0.0–1.2)
CO2: 25 mmol/L (ref 20–29)
Calcium: 9.6 mg/dL (ref 8.7–10.3)
Chloride: 102 mmol/L (ref 96–106)
Creatinine, Ser: 0.87 mg/dL (ref 0.57–1.00)
Globulin, Total: 2.3 g/dL (ref 1.5–4.5)
Glucose: 95 mg/dL (ref 70–99)
Potassium: 4.2 mmol/L (ref 3.5–5.2)
Sodium: 142 mmol/L (ref 134–144)
Total Protein: 6.6 g/dL (ref 6.0–8.5)
eGFR: 73 mL/min/{1.73_m2} (ref >59)

## 2023-10-03 LAB — CBC WITH DIFFERENTIAL/PLATELET
Basophils Absolute: 0.1 10*3/uL (ref 0.0–0.2)
Basos: 1 %
EOS (ABSOLUTE): 0.1 10*3/uL (ref 0.0–0.4)
Eos: 1 %
Hematocrit: 46 % (ref 34.0–46.6)
Hemoglobin: 14.8 g/dL (ref 11.1–15.9)
Immature Grans (Abs): 0 10*3/uL (ref 0.0–0.1)
Immature Granulocytes: 0 %
Lymphocytes Absolute: 2.1 10*3/uL (ref 0.7–3.1)
Lymphs: 21 %
MCH: 31.1 pg (ref 26.6–33.0)
MCHC: 32.2 g/dL (ref 31.5–35.7)
MCV: 97 fL (ref 79–97)
Monocytes Absolute: 0.7 10*3/uL (ref 0.1–0.9)
Monocytes: 7 %
Neutrophils Absolute: 7 10*3/uL (ref 1.4–7.0)
Neutrophils: 70 %
Platelets: 243 10*3/uL (ref 150–450)
RBC: 4.76 x10E6/uL (ref 3.77–5.28)
RDW: 11.8 % (ref 11.7–15.4)
WBC: 10 10*3/uL (ref 3.4–10.8)

## 2023-10-03 LAB — THYROID PANEL WITH TSH
Free Thyroxine Index: 2.5 (ref 1.2–4.9)
T3 Uptake Ratio: 26 % (ref 24–39)
T4, Total: 9.7 ug/dL (ref 4.5–12.0)
TSH: 3.51 u[IU]/mL (ref 0.450–4.500)

## 2023-10-03 LAB — LIPID PANEL W/O CHOL/HDL RATIO
Cholesterol, Total: 178 mg/dL (ref 100–199)
HDL: 46 mg/dL (ref >39)
LDL Chol Calc (NIH): 108 mg/dL — ABNORMAL HIGH (ref 0–99)
Triglycerides: 132 mg/dL (ref 0–149)
VLDL Cholesterol Cal: 24 mg/dL (ref 5–40)

## 2023-10-04 ENCOUNTER — Ambulatory Visit: Payer: Self-pay | Admitting: Family Medicine

## 2023-10-04 MED ORDER — LEVOTHYROXINE SODIUM 125 MCG PO TABS: 125.0000 ug | ORAL_TABLET | Freq: Every day | ORAL | 3 refills | Status: AC

## 2023-10-11 ENCOUNTER — Encounter: Payer: Self-pay | Admitting: Family Medicine

## 2023-10-14 ENCOUNTER — Other Ambulatory Visit: Payer: Self-pay | Admitting: Family Medicine

## 2023-10-14 DIAGNOSIS — M545 Low back pain, unspecified: Secondary | ICD-10-CM

## 2023-10-23 ENCOUNTER — Ambulatory Visit: Admitting: Pulmonary Disease

## 2023-10-23 ENCOUNTER — Encounter: Payer: Self-pay | Admitting: Pulmonary Disease

## 2023-10-23 VITALS — BP 128/80 | HR 73 | Temp 98.1°F | Ht 65.0 in | Wt 213.8 lb

## 2023-10-23 DIAGNOSIS — J449 Chronic obstructive pulmonary disease, unspecified: Secondary | ICD-10-CM | POA: Diagnosis not present

## 2023-10-23 DIAGNOSIS — G4736 Sleep related hypoventilation in conditions classified elsewhere: Secondary | ICD-10-CM | POA: Diagnosis not present

## 2023-10-23 DIAGNOSIS — R0602 Shortness of breath: Secondary | ICD-10-CM | POA: Diagnosis not present

## 2023-10-23 DIAGNOSIS — E669 Obesity, unspecified: Secondary | ICD-10-CM

## 2023-10-23 DIAGNOSIS — Z87891 Personal history of nicotine dependence: Secondary | ICD-10-CM

## 2023-10-23 DIAGNOSIS — Z6835 Body mass index (BMI) 35.0-35.9, adult: Secondary | ICD-10-CM

## 2023-10-23 LAB — PULMONARY FUNCTION TEST
DL/VA % pred: 87 %
DL/VA: 3.62 ml/min/mmHg/L
DLCO unc % pred: 98 %
DLCO unc: 19.81 ml/min/mmHg
FEF 25-75 Post: 2.33 L/s
FEF 25-75 Pre: 1.7 L/s
FEF2575-%Change-Post: 36 %
FEF2575-%Pred-Post: 111 %
FEF2575-%Pred-Pre: 81 %
FEV1-%Change-Post: 9 %
FEV1-%Pred-Post: 102 %
FEV1-%Pred-Pre: 93 %
FEV1-Post: 2.52 L
FEV1-Pre: 2.31 L
FEV1FVC-%Change-Post: 3 %
FEV1FVC-%Pred-Pre: 94 %
FEV6-%Change-Post: 6 %
FEV6-%Pred-Post: 107 %
FEV6-%Pred-Pre: 101 %
FEV6-Post: 3.32 L
FEV6-Pre: 3.12 L
FEV6FVC-%Change-Post: 0 %
FEV6FVC-%Pred-Post: 103 %
FEV6FVC-%Pred-Pre: 102 %
FVC-%Change-Post: 5 %
FVC-%Pred-Post: 103 %
FVC-%Pred-Pre: 98 %
FVC-Post: 3.34 L
FVC-Pre: 3.17 L
Post FEV1/FVC ratio: 75 %
Post FEV6/FVC ratio: 99 %
Pre FEV1/FVC ratio: 73 %
Pre FEV6/FVC Ratio: 98 %
RV % pred: 166 %
RV: 3.63 L
TLC % pred: 136 %
TLC: 7.09 L

## 2023-10-23 NOTE — Patient Instructions (Signed)
 VISIT SUMMARY:  During your visit, we discussed your ongoing issues with shortness of breath, sleep apnea, obesity, arthritis, and depression. We reviewed your pulmonary function tests, which show mild COPD, and discussed the impact of your current medications and lifestyle on your symptoms.  YOUR PLAN:  -MILD COPD: Chronic Obstructive Pulmonary Disease (COPD) is a lung condition that makes it hard to breathe. Your lung function is good, but you still experience shortness of breath. We will continue to monitor your symptoms and adjust treatment as needed.  -SLEEP APNEA: Sleep apnea is a condition where breathing stops and starts during sleep. Although your recent in-lab study did not confirm sleep apnea, your sleep issues may be related to stress or heat. We will reassess the need for another sleep study in four months.  -OBESITY: Obesity is a condition where excess body fat affects your health. Your weight has not changed significantly, and your current antidepressant, Celexa , may be contributing to weight gain. We will discuss with your primary care physician about changing your antidepressant to help with weight management.  -ARTHRITIS: Arthritis is inflammation of the joints, causing pain and stiffness. It limits your physical activity and affects your ability to participate in pulmonary rehabilitation. We will continue to manage your arthritis symptoms to improve your mobility.  -DEPRESSION: Depression is a mood disorder causing persistent feelings of sadness and loss of interest. Your current antidepressant, Celexa , may not be the best option due to potential weight gain and cardiac concerns. We will discuss with your primary care physician about reviewing and potentially changing your antidepressant.  INSTRUCTIONS:  We will reassess the need for another sleep study in four months. Please discuss with your primary care physician about changing your antidepressant to address weight gain and  depression. Continue to manage your arthritis symptoms to improve your mobility.

## 2023-10-23 NOTE — Patient Instructions (Signed)
 Full PFT completed today with MIP/MEP

## 2023-10-23 NOTE — Progress Notes (Signed)
 Subjective:    [name], female    DOB: 27-Feb-1956, 68 y.o.   MRN: [id number]  Patient Care Team: Solomon Dupre, DO as PCP - General (Family Medicine) Adriana Albany, LCSW as Social Worker (Licensed Clinical Social Worker)  Chief Complaint  Patient presents with   Follow-up    Shortness of breath on exertion.     BACKGROUND/INTERVAL:[name] is a 68 year old recent former smoker with a 40-pack-year history of smoking and a history as noted below who follows for the issue of shortness of breath. She was last seen here on 22 August 2023. 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 She has nocturnal hypoxemia due to COPD/obesity, she is on 1-1/2 L of oxygen nocturnally. He has not had any exacerbations since her prior visit.   HPI Discussed the use of AI scribe software for clinical note transcription with the patient, who gave verbal consent to proceed.  History of Present Illness   [name] is a 68 year old female with mild COPD who presents with shortness of breath.  She experiences persistent shortness of breath despite pulmonary function tests showing a baseline of 93% improving to 102% post-inhaler use. Her shortness of breath may be related to weight issues and stress. She has not experienced significant weight changes, losing only two ounces, but feels 'squished' due to being 'shorter and bigger around.'  She does have kyphosis.  Her back issues and arthritis have hindered her ability to continue pulmonary rehabilitation and limit her physical activity, such as walking in the park.  She has a history of sleep apnea, though recent in-lab studies did not confirm this diagnosis. Her sleep issues are intermittent, often attributed to stress and heat, and she misses using her CPAP machine.  Her current medications include Celexa  and Rythmol . Celexa  may be contributing to her weight gain. She has a history of using Wellbutrin , which  she found helpful in the past. She has quit smoking but struggles with exposure to secondhand smoke from others around her.  A thyroid  panel was conducted and returned normal results. She expresses dissatisfaction with her insurance provider, Raina Bunting, which has been a barrier to accessing certain medications particularly GLP-1's that may help with weight loss.     DATA 12/26/2021 PFTs: FEV1 2.41 L or 95% predicted, FVC 3.27 L or 99% predicted FEV1/FVC 74%, lung volumes low normal.  Postbronchodilator study not done.  Diffusion capacity mildly reduced.  There is curvature of the flow-volume loop consistent with mild obstruction. 12/26/2021 echocardiogram: LVEF 60 to 65%, no regional wall motion abnormalities, grade 1 DD, RV function normal, LA size moderately dilated, mild mitral regurg.  No mitral stenosis.  No aortic valve abnormalities. 06/01/2022 HST: Severe obstructive sleep apnea with AHI of 40 and SpO2 low of 79%. 08/02/2022 LDCT chest: Centrilobular and paraseptal emphysema evident.  Tiny bilateral pulmonary nodules maximum size of 4.1 mm no suspicious pulmonary nodule or mass.  No focal airspace consolidation.  Lung RADS 2, benign appearance and behavior, continue yearly monitoring. 10/09/2022 in lab sleep study: No evidence of sleep apnea.  AHI 0.4, supine AHI 2.3, patient was noted to have nocturnal hypoxemia likely related to COPD/obesity O2 nadir 83% recommended 1 L/min supplemental oxygen.  Some PLMS with arousals. 08/29/2023 LDCT chest: Lung RADS 2, benign appearance and behavior, continue annual screening with low-dose chest CT.  Two-vessel coronary atherosclerosis, emphysema. 10/23/2023 PFTs: FEV1 2.31 L or 93% predicted, FVC 3.17 L or 98%  predicted, FEV1/FVC 73%, there is no significant bronchodilator response.  Lung volumes show mild hyperinflation and air trapping, ERV low consistent with obesity.  Diffusion capacity is normal.  Overall unchanged from prior.  Mild obstruction. Review of  Systems A 10 point review of systems was performed and it is as noted above otherwise negative.   Patient Active Problem List   Diagnosis Date Noted   Homelessness 09/22/2021   Advance directive discussed with patient 01/30/2021   Scoliosis 08/02/2015   Chronic fatigue 02/06/2015   CKD (chronic kidney disease) stage 3, GFR 30-59 ml/min (HCC) 02/06/2015   Nasal polyp, posterior 02/03/2015   Muscle spasms of head and/or neck 11/11/2014   Neck pain of over 3 months duration 10/21/2014   GERD (gastroesophageal reflux disease)    Hypothyroidism    Migraines    Depression, major, recurrent, moderate (HCC) 07/20/2014    Class: Chronic   OSA (obstructive sleep apnea) 02/15/2014   Atrial fibrillation (HCC) 08/29/2012   Dyspnea on exertion 08/29/2012   Osteoarthritis 02/25/2008   Hyperlipidemia 01/29/2007   COPD (chronic obstructive pulmonary disease) (HCC) 01/29/2007   Anxiety state 01/28/2007   Tobacco abuse 01/28/2007   OVARIAN CYST, LEFT 05/14/2006    Social History   Tobacco Use   Smoking status: Former    Current packs/day: 1.50    Average packs/day: 1.5 packs/day for 39.0 years (58.5 ttl pk-yrs)    Types: Cigarettes   Smokeless tobacco: Never   Tobacco comments:    Quit smoking cigarettes 10/20/2022 khj  Substance Use Topics   Alcohol use: Yes    Alcohol/week: 0.0 standard drinks of alcohol    Comment: Rare    Allergies  Allergen Reactions   Bee Venom Anaphylaxis   Penicillins Rash   Sulfonamide Derivatives Rash   Cardizem  [Diltiazem Hcl] Other (See Comments)    Other Reaction: severe hypotension w/ IV dose   Sulfa Antibiotics Hives   Tape Rash    Uncoded Allergy. Allergen: tape & bandaids    Current Meds  Medication Sig   albuterol  (VENTOLIN  HFA) 108 (90 Base) MCG/ACT inhaler Inhale 2 puffs into the lungs every 6 (six) hours as needed for wheezing or shortness of breath.   atorvastatin  (LIPITOR) 40 MG tablet Take 1 tablet (40 mg total) by mouth daily.    citalopram  (CELEXA ) 40 MG tablet Take 1.5 tablets (60 mg total) by mouth daily.   furosemide  (LASIX ) 20 MG tablet Take 1 tablet (20 mg total) by mouth 3 (three) times a week. (Patient taking differently: Take 20 mg by mouth 3 (three) times a week. As needed.)   latanoprost (XALATAN) 0.005 % ophthalmic solution Place 1 drop into both eyes at bedtime.   levothyroxine  (SYNTHROID ) 125 MCG tablet Take 1 tablet (125 mcg total) by mouth daily with breakfast.   naproxen  (NAPROSYN ) 500 MG tablet Take 1 tablet (500 mg total) by mouth 2 (two) times daily with a meal.   OXYGEN Inhale into the lungs.   pantoprazole  (PROTONIX ) 20 MG tablet Take 1 tablet (20 mg total) by mouth daily.   propafenone  (RYTHMOL ) 225 MG tablet TAKE 1 TABLET BY MOUTH DAILY AS NEEDED FOR AFIB   Spacer/Aero-Holding Chambers DEVI 1 Device by Does not apply route as needed.   Tiotropium Bromide-Olodaterol (STIOLTO RESPIMAT ) 2.5-2.5 MCG/ACT AERS Inhale 2 puffs into the lungs daily.    Immunization History  Administered Date(s) Administered   Moderna Sars-Covid-2 Vaccination 05/10/2020   PFIZER(Purple Top)SARS-COV-2 Vaccination 08/18/2019, 09/05/2019   Pneumococcal Conjugate-13 01/30/2021  Td 05/08/1995, 12/14/2008   Tdap 11/20/2019        Objective:     BP 128/80 (BP Location: Left Arm, Patient Position: Sitting, Cuff Size: Normal)   Pulse 73   Temp 98.1 F (36.7 C) (Oral)   Ht 5' 5 (1.651 m)   Wt 213 lb 12.8 oz (97 kg)   SpO2 96%   BMI 35.58 kg/m   SpO2: 96 %  GENERAL: Obese woman, no acute distress, fully ambulatory, no conversational dyspnea. HEAD: Normocephalic, atraumatic.  EYES: Pupils equal, round, reactive to light.  No scleral icterus.  MOUTH: Poor dentition, missing teeth, chipped. NECK: Supple. No thyromegaly. Trachea midline. No JVD.  No adenopathy. PULMONARY: Good air entry bilaterally.  No adventitious sounds. CARDIOVASCULAR: S1 and S2. Regular rate and rhythm.  No rubs, murmurs or gallops  heard. ABDOMEN: Obese otherwise, benign. MUSCULOSKELETAL: Significant kyphosis noted no joint deformity, no clubbing, no edema.  NEUROLOGIC: No overt focal deficit, no gait disturbance, speech is fluent. SKIN: Intact,warm,dry. PSYCH: Mood and behavior normal.  Assessment & Plan:     ICD-10-CM   1. Stage 1 mild COPD by GOLD classification (HCC)  J44.9     2. Nocturnal hypoxemia due to obesity  E66.9    G47.36     3. Former smoker  Z87.891      Discussion:    Mild COPD Pulmonary function tests indicate minimal to mild obstructive disease, consistent with very mild COPD. Lung capacity for oxygen is normal, yet shortness of breath persists despite good lung function.  Sleep apnea Previous sleep study indicated sleep apnea, but in-lab study did not confirm. Sleep issues are intermittent, potentially related to stress or heat. She misses CPAP therapy. - Reassess need for sleep study in four months.  Obesity Persistent obesity with no significant weight loss. Insurance issues limit access to weight loss medications. Current antidepressant, Celexa , may contribute to weight gain. - Discuss with primary care physician about changing antidepressant to address weight gain.  Arthritis Arthritis impairs physical activity, affecting participation in pulmonary rehabilitation.  Depression Current antidepressant, Celexa , may not be optimal due to potential weight gain and cardiac concerns. Previous medications included Wellbutrin  and Buspar . - Discuss with primary care physician about reviewing and potentially changing antidepressant.     Advised if symptoms do not improve or worsen, to please contact office for sooner follow up or seek emergency care.    I spent 31 minutes of dedicated to the care of this patient on the date of this encounter to include pre-visit review of records, face-to-face time with the patient discussing conditions above, post visit ordering of testing, clinical  documentation with the electronic health record, making appropriate referrals as documented, and communicating necessary findings to members of the patients care team.     C. Chloe Counter, MD Advanced Bronchoscopy PCC[facility]    *This note was generated using voice recognition software/Dragon and/or AI transcription program.  Despite best efforts to proofread, errors can occur which can change the meaning. Any transcriptional errors that result from this process are unintentional and may not be fully corrected at the time of dictation.

## 2023-10-23 NOTE — Progress Notes (Signed)
 Full PFT completed today with MIP/MEP

## 2023-10-29 ENCOUNTER — Ambulatory Visit: Payer: Self-pay | Admitting: Pulmonary Disease

## 2023-11-22 ENCOUNTER — Encounter: Payer: Self-pay | Admitting: Family Medicine

## 2023-11-22 ENCOUNTER — Ambulatory Visit: Admitting: Family Medicine

## 2023-11-22 DIAGNOSIS — Z538 Procedure and treatment not carried out for other reasons: Secondary | ICD-10-CM

## 2023-11-22 NOTE — Progress Notes (Signed)
 Did not start PT or obtain x-rays yet. Will follow up in 1 month after starting PT

## 2023-11-26 ENCOUNTER — Ambulatory Visit
Admission: RE | Admit: 2023-11-26 | Discharge: 2023-11-26 | Disposition: A | Discharge status: Home / Self Care | Attending: Family Medicine | Admitting: Family Medicine

## 2023-11-26 ENCOUNTER — Ambulatory Visit
Admission: RE | Admit: 2023-11-26 | Discharge: 2023-11-26 | Disposition: A | Discharge status: Home / Self Care | Source: Ambulatory Visit | Attending: Family Medicine | Admitting: Family Medicine

## 2023-11-26 DIAGNOSIS — M545 Low back pain, unspecified: Secondary | ICD-10-CM

## 2024-01-13 ENCOUNTER — Encounter: Payer: Self-pay | Admitting: Family Medicine

## 2024-01-13 ENCOUNTER — Ambulatory Visit: Admitting: Family Medicine

## 2024-01-13 VITALS — BP 112/76 | HR 79 | Temp 97.6°F | Ht 65.0 in | Wt 211.2 lb

## 2024-01-13 DIAGNOSIS — E039 Hypothyroidism, unspecified: Secondary | ICD-10-CM | POA: Diagnosis not present

## 2024-01-13 DIAGNOSIS — M4726 Other spondylosis with radiculopathy, lumbar region: Secondary | ICD-10-CM

## 2024-01-13 NOTE — Assessment & Plan Note (Signed)
 Rechecking labs today. Await results. Treat as needed.

## 2024-01-13 NOTE — Assessment & Plan Note (Signed)
 Not resolving with PT. Will get her in for MRI to consider injections. Call with any concerns. Continue to monitor.

## 2024-01-13 NOTE — Progress Notes (Signed)
 BP 112/76 (BP Location: Left Arm, Patient Position: Sitting, Cuff Size: Normal)   Pulse 79   Temp 97.6 F (36.4 C)   Ht 5' 5 (1.651 m)   Wt 211 lb 3.2 oz (95.8 kg)   SpO2 98%   BMI 35.15 kg/m    Subjective:    [name], female    DOB: August 03, 1955, 68 y.o.   MRN: [id number]  HPI: [name] is a 68 y.o. female  Chief Complaint  Patient presents with   Follow-up    Pt arrived for a 6 week follow up; No other questions or concerns    BACK PAIN- Loves the aquatherapy. It's really helping but still in significant pain.  Duration: chronic Mechanism of injury: unknown Location: bilateral low back Onset: gradual Severity: moderate Quality: aching and shooting Frequency: constant Radiation: into her R leg Aggravating factors: moving, lifting Alleviating factors: aquatherapy Status: better Treatments attempted: rest, ice, heat, APAP, ibuprofen, and aleve , aquatherapy  Relief with NSAIDs?: mild Nighttime pain:  no Paresthesias / decreased sensation:  no Bowel / bladder incontinence:  no Fevers:  no Dysuria / urinary frequency:  no  HYPOTHYROIDISM Thyroid  control status:controlled Satisfied with current treatment? yes Medication side effects: no Medication compliance: excellent compliance Etiology of hypothyroidism:  Recent dose adjustment:no Fatigue: yes Cold intolerance: no Heat intolerance: no Weight gain: no Weight loss: no Constipation: no Diarrhea/loose stools: no Palpitations: no Lower extremity edema: no Anxiety/depressed mood: no   Relevant past medical, surgical, family and social history reviewed and updated as indicated. Interim medical history since our last visit reviewed. Allergies and medications reviewed and updated.  Review of Systems  Constitutional: Negative.   Respiratory: Negative.    Cardiovascular: Negative.   Musculoskeletal:  Positive for back pain and myalgias. Negative for arthralgias, gait problem, joint  swelling, neck pain and neck stiffness.  Neurological: Negative.   Psychiatric/Behavioral: Negative.      Per HPI unless specifically indicated above     Objective:    BP 112/76 (BP Location: Left Arm, Patient Position: Sitting, Cuff Size: Normal)   Pulse 79   Temp 97.6 F (36.4 C)   Ht 5' 5 (1.651 m)   Wt 211 lb 3.2 oz (95.8 kg)   SpO2 98%   BMI 35.15 kg/m   Wt Readings from Last 3 Encounters:  01/13/24 211 lb 3.2 oz (95.8 kg)  10/23/23 213 lb 12.8 oz (97 kg)  10/23/23 213 lb 12.8 oz (97 kg)    Physical Exam Vitals and nursing note reviewed.  Constitutional:      General: She is not in acute distress.    Appearance: Normal appearance. She is not ill-appearing, toxic-appearing or diaphoretic.  HENT:     Head: Normocephalic and atraumatic.     Right Ear: External ear normal.     Left Ear: External ear normal.     Nose: Nose normal.     Mouth/Throat:     Mouth: Mucous membranes are moist.     Pharynx: Oropharynx is clear.  Eyes:     General: No scleral icterus.       Right eye: No discharge.        Left eye: No discharge.     Extraocular Movements: Extraocular movements intact.     Conjunctiva/sclera: Conjunctivae normal.     Pupils: Pupils are equal, round, and reactive to light.  Cardiovascular:     Rate and Rhythm: Normal rate and regular rhythm.     Pulses:  Normal pulses.     Heart sounds: Normal heart sounds. No murmur heard.    No friction rub. No gallop.  Pulmonary:     Effort: Pulmonary effort is normal. No respiratory distress.     Breath sounds: Normal breath sounds. No stridor. No wheezing, rhonchi or rales.  Chest:     Chest wall: No tenderness.  Musculoskeletal:        General: Normal range of motion.     Cervical back: Normal range of motion and neck supple.  Skin:    General: Skin is warm and dry.     Capillary Refill: Capillary refill takes less than 2 seconds.     Coloration: Skin is not jaundiced or pale.     Findings: No bruising,  erythema, lesion or rash.  Neurological:     General: No focal deficit present.     Mental Status: She is alert and oriented to person, place, and time. Mental status is at baseline.  Psychiatric:        Mood and Affect: Mood normal.        Behavior: Behavior normal.        Thought Content: Thought content normal.        Judgment: Judgment normal.     Results for orders placed or performed in visit on 10/23/23  Pulmonary function test   Collection Time: 10/23/23  8:49 AM  Result Value Ref Range   FVC-Pre 3.17 L   FVC-%Pred-Pre 98 %   FVC-Post 3.34 L   FVC-%Pred-Post 103 %   FVC-%Change-Post 5 %   FEV1-Pre 2.31 L   FEV1-%Pred-Pre 93 %   FEV1-Post 2.52 L   FEV1-%Pred-Post 102 %   FEV1-%Change-Post 9 %   FEV6-Pre 3.12 L   FEV6-%Pred-Pre 101 %   FEV6-Post 3.32 L   FEV6-%Pred-Post 107 %   FEV6-%Change-Post 6 %   Pre FEV1/FVC ratio 73 %   FEV1FVC-%Pred-Pre 94 %   Post FEV1/FVC ratio 75 %   FEV1FVC-%Change-Post 3 %   Pre FEV6/FVC Ratio 98 %   FEV6FVC-%Pred-Pre 102 %   Post FEV6/FVC ratio 99 %   FEV6FVC-%Pred-Post 103 %   FEV6FVC-%Change-Post 0 %   FEF 25-75 Pre 1.70 L/sec   FEF2575-%Pred-Pre 81 %   FEF 25-75 Post 2.33 L/sec   FEF2575-%Pred-Post 111 %   FEF2575-%Change-Post 36 %   RV 3.63 L   RV % pred 166 %   TLC 7.09 L   TLC % pred 136 %   DLCO unc 19.81 ml/min/mmHg   DLCO unc % pred 98 %   DL/VA 6.37 ml/min/mmHg/L   DL/VA % pred 87 %      Assessment & Plan:   Problem List Items Addressed This Visit       Endocrine   Hypothyroidism - Primary   Rechecking labs today. Await results. Treat as needed.       Relevant Orders   TSH     Musculoskeletal and Integument   Osteoarthritis   Not resolving with PT. Will get her in for MRI to consider injections. Call with any concerns. Continue to monitor.       Relevant Orders   MR Lumbar Spine Wo Contrast     Follow up plan: Return in about 6 weeks (around 02/24/2024).

## 2024-01-14 ENCOUNTER — Ambulatory Visit: Payer: Self-pay | Admitting: Family Medicine

## 2024-01-14 LAB — TSH: TSH: 2.62 u[IU]/mL (ref 0.450–4.500)

## 2024-01-16 NOTE — Addendum Note (Signed)
 Addended by: VICCI DUWAINE SQUIBB on: 01/16/2024 03:26 PM   Modules accepted: Orders

## 2024-01-23 ENCOUNTER — Encounter: Payer: Self-pay | Admitting: Family Medicine

## 2024-02-03 ENCOUNTER — Other Ambulatory Visit

## 2024-02-13 ENCOUNTER — Other Ambulatory Visit: Payer: Self-pay | Admitting: Family Medicine

## 2024-02-14 NOTE — Telephone Encounter (Signed)
 Requested Prescriptions  Refused Prescriptions Disp Refills   atorvastatin  (LIPITOR) 40 MG tablet [Pharmacy Med Name: ATORVASTATIN  40 MG TABLET] 90 tablet 1    Sig: TAKE 1 TABLET BY MOUTH DAILY     Cardiovascular:  Antilipid - Statins Failed - 02/14/2024  3:45 PM      Failed - Lipid Panel in normal range within the last 12 months    Cholesterol, Total  Date Value Ref Range Status  10/02/2023 178 100 - 199 mg/dL Final   LDL Chol Calc (NIH)  Date Value Ref Range Status  10/02/2023 108 (H) 0 - 99 mg/dL Final   Direct LDL  Date Value Ref Range Status  03/18/2007 150.0 mg/dL Final    Comment:    See lab report for associated comment(s)   HDL  Date Value Ref Range Status  10/02/2023 46 >39 mg/dL Final   Triglycerides  Date Value Ref Range Status  10/02/2023 132 0 - 149 mg/dL Final         Passed - Patient is not pregnant      Passed - Valid encounter within last 12 months    Recent Outpatient Visits           1 month ago Hypothyroidism, unspecified type   [place] [facility], Megan P, DO   2 months ago Appointment canceled by hospital   [place] [facility], Megan P, DO   4 months ago Chronic bilateral low back pain without sciatica   [place] [facility], Megan P, DO

## 2024-02-18 ENCOUNTER — Telehealth: Payer: Self-pay

## 2024-02-18 NOTE — Telephone Encounter (Signed)
 Copied from CRM [id number]. Topic: General - Other >> Feb 18, 2024 12:12 PM Fonda T wrote: Reason for CRM: Patient is calling to inform provider that she will not be able to have MRI with contrast per insurance, does not cover test, and out of pocket costs.   Per patient, wanted to inform, per patient will have new insurance in January, at which time can re-visit at next appointment.   Patient can be reached if need to discuss further, at [phone].

## 2024-02-18 NOTE — Telephone Encounter (Signed)
 Routing to provider as an Financial planner

## 2024-02-25 ENCOUNTER — Ambulatory Visit: Admitting: Family Medicine

## 2024-02-25 ENCOUNTER — Ambulatory Visit: Admitting: Pulmonary Disease

## 2024-03-05 ENCOUNTER — Encounter: Payer: Self-pay | Admitting: Pulmonary Disease

## 2024-03-05 ENCOUNTER — Ambulatory Visit (INDEPENDENT_AMBULATORY_CARE_PROVIDER_SITE_OTHER): Admitting: Pulmonary Disease

## 2024-03-05 VITALS — BP 116/76 | HR 78 | Temp 98.0°F | Ht 65.0 in | Wt 215.0 lb

## 2024-03-05 DIAGNOSIS — Z87891 Personal history of nicotine dependence: Secondary | ICD-10-CM

## 2024-03-05 DIAGNOSIS — J449 Chronic obstructive pulmonary disease, unspecified: Secondary | ICD-10-CM | POA: Diagnosis not present

## 2024-03-05 DIAGNOSIS — G4736 Sleep related hypoventilation in conditions classified elsewhere: Secondary | ICD-10-CM | POA: Diagnosis not present

## 2024-03-05 DIAGNOSIS — E669 Obesity, unspecified: Secondary | ICD-10-CM

## 2024-03-05 DIAGNOSIS — R0602 Shortness of breath: Secondary | ICD-10-CM | POA: Diagnosis not present

## 2024-03-05 NOTE — Patient Instructions (Signed)
 VISIT SUMMARY:  You came in today for a follow-up visit regarding your COPD and sleep issues. We discussed your current symptoms and management strategies.  YOUR PLAN:  -STAGE 1 CHRONIC OBSTRUCTIVE PULMONARY DISEASE (COPD): COPD is a chronic lung condition that makes it hard to breathe. You are currently not using your inhalers regularly, but you feel a slight improvement when you do use them. We will continue to monitor your symptoms.  -SLEEP APNEA: Sleep apnea is a condition where you stop breathing for short periods during sleep. You are using a monitor to detect apnea and atrial fibrillation, which has been effective. We discussed the possibility of checking your oxygen levels at night and consulting with a sleep specialist if your sleep issues persist or worsen.  INSTRUCTIONS:  Please continue to monitor how you sleep.  If you notice any worsening on your sleep hygiene need to be reevaluated we can refer you to Dr. Jess our sleep specialist.  Otherwise we will see you back in a year's time.  You will have your lung cancer screening test in April this coming year.

## 2024-03-05 NOTE — Progress Notes (Unsigned)
 Subjective:    [name], female    DOB: [birth year], 68 y.o.   MRN: [id number]  Patient Care Team: Vicci Duwaine SQUIBB, DO as PCP - General (Family Medicine) Ezzard Rolin BIRCH, LCSW as Social Worker (Licensed Clinical Social Worker)  Chief Complaint  Patient presents with  . COPD    BACKGROUND/INTERVAL:  HPI   Review of Systems A 10 point review of systems was performed and it is as noted above otherwise negative.   Patient Active Problem List   Diagnosis Date Noted  . Homelessness 09/22/2021  . Advance directive discussed with patient 01/30/2021  . Scoliosis 08/02/2015  . Chronic fatigue 02/06/2015  . CKD (chronic kidney disease) stage 3, GFR 30-59 ml/min (HCC) 02/06/2015  . Nasal polyp, posterior 02/03/2015  . Muscle spasms of head and/or neck 11/11/2014  . Neck pain of over 3 months duration 10/21/2014  . GERD (gastroesophageal reflux disease)   . Hypothyroidism   . Migraines   . Depression, major, recurrent, moderate (HCC) 07/20/2014    Class: Chronic  . OSA (obstructive sleep apnea) 02/15/2014  . Atrial fibrillation (HCC) 08/29/2012  . Dyspnea on exertion 08/29/2012  . Osteoarthritis 02/25/2008  . Hyperlipidemia 01/29/2007  . COPD (chronic obstructive pulmonary disease) (HCC) 01/29/2007  . Anxiety state 01/28/2007  . Tobacco abuse 01/28/2007  . OVARIAN CYST, LEFT 05/14/2006    Social History   Tobacco Use  . Smoking status: Former    Current packs/day: 1.50    Average packs/day: 1.5 packs/day for 39.0 years (58.5 ttl pk-yrs)    Types: Cigarettes  . Smokeless tobacco: Never  . Tobacco comments:    Quit smoking cigarettes 10/20/2022 khj  Substance Use Topics  . Alcohol use: Yes    Alcohol/week: 0.0 standard drinks of alcohol    Comment: Rare    Allergies  Allergen Reactions  . Bee Venom Anaphylaxis  . Penicillins Rash  . Sulfonamide Derivatives Rash  . Cardizem  [Diltiazem Hcl] Other (See Comments)    Other Reaction: severe  hypotension w/ IV dose  . Sulfa Antibiotics Hives  . Tape Rash    Uncoded Allergy. Allergen: tape & bandaids    Current Meds  Medication Sig  . albuterol  (VENTOLIN  HFA) 108 (90 Base) MCG/ACT inhaler Inhale 2 puffs into the lungs every 6 (six) hours as needed for wheezing or shortness of breath.  . atorvastatin  (LIPITOR) 40 MG tablet Take 1 tablet (40 mg total) by mouth daily.  . citalopram  (CELEXA ) 40 MG tablet Take 1.5 tablets (60 mg total) by mouth daily.  . levothyroxine  (SYNTHROID ) 125 MCG tablet Take 1 tablet (125 mcg total) by mouth daily with breakfast.  . OXYGEN Inhale into the lungs. (Patient taking differently: Inhale into the lungs. prn)  . pantoprazole  (PROTONIX ) 20 MG tablet Take 1 tablet (20 mg total) by mouth daily.  . propafenone  (RYTHMOL ) 225 MG tablet TAKE 1 TABLET BY MOUTH DAILY AS NEEDED FOR AFIB  . Spacer/Aero-Holding Chambers DEVI 1 Device by Does not apply route as needed.    Immunization History  Administered Date(s) Administered  . Moderna Sars-Covid-2 Vaccination 05/10/2020  . PFIZER(Purple Top)SARS-COV-2 Vaccination 08/18/2019, 09/05/2019  . Pneumococcal Conjugate-13 01/30/2021  . Td 05/08/1995, 12/14/2008  . Tdap 11/20/2019        Objective:     Ht 5' 5 (1.651 m)   Wt 215 lb (97.5 kg)   BMI 35.78 kg/m      GENERAL: HEAD: Normocephalic, atraumatic.  EYES: Pupils equal, round, reactive to  light.  No scleral icterus.  MOUTH:  NECK: Supple. No thyromegaly. Trachea midline. No JVD.  No adenopathy. PULMONARY: Good air entry bilaterally.  No adventitious sounds. CARDIOVASCULAR: S1 and S2. Regular rate and rhythm.  ABDOMEN: MUSCULOSKELETAL: No joint deformity, no clubbing, no edema.  NEUROLOGIC:  SKIN: Intact,warm,dry. PSYCH:        Assessment & Plan:   No diagnosis found.  No orders of the defined types were placed in this encounter.   No orders of the defined types were placed in this encounter.     Advised if symptoms do not  improve or worsen, to please contact office for sooner follow up or seek emergency care.    I spent xxx minutes of dedicated to the care of this patient on the date of this encounter to include pre-visit review of records, face-to-face time with the patient discussing conditions above, post visit ordering of testing, clinical documentation with the electronic health record, making appropriate referrals as documented, and communicating necessary findings to members of the patients care team.     C. Leita Sanders, MD Advanced [facility]ch[facility]    *This note was generated using voice recognition software/Dragon and/or AI transcription program.  Despite best efforts to proofread, errors can occur which can change the meaning. Any transcriptional errors that result from this process are unintentional and may not be fully corrected at the time of dictation.

## 2024-05-03 ENCOUNTER — Other Ambulatory Visit: Payer: Self-pay | Admitting: Family Medicine

## 2024-05-18 ENCOUNTER — Other Ambulatory Visit: Payer: Self-pay | Admitting: Family Medicine

## 2024-05-19 NOTE — Telephone Encounter (Signed)
 Patient is overdue for an appointment. Please call to schedule and then route to provider for possible refill.

## 2024-05-26 NOTE — Telephone Encounter (Signed)
 This encounter was created in error - please disregard.

## 2024-07-03 ENCOUNTER — Ambulatory Visit: Admitting: Family Medicine
